# Patient Record
Sex: Male | Born: 1940
Health system: Southern US, Community
[De-identification: ages and names within clinical notes are randomized; demographics above are authoritative.]

## PROBLEM LIST (undated history)

## (undated) DIAGNOSIS — M79606 Pain in leg, unspecified: Secondary | ICD-10-CM

## (undated) DIAGNOSIS — F32A Depression, unspecified: Secondary | ICD-10-CM

## (undated) DIAGNOSIS — H9190 Unspecified hearing loss, unspecified ear: Secondary | ICD-10-CM

## (undated) DIAGNOSIS — G4733 Obstructive sleep apnea (adult) (pediatric): Secondary | ICD-10-CM

## (undated) DIAGNOSIS — R451 Restlessness and agitation: Secondary | ICD-10-CM

## (undated) DIAGNOSIS — I482 Chronic atrial fibrillation, unspecified: Secondary | ICD-10-CM

## (undated) DIAGNOSIS — E119 Type 2 diabetes mellitus without complications: Secondary | ICD-10-CM

## (undated) DIAGNOSIS — Z973 Presence of spectacles and contact lenses: Secondary | ICD-10-CM

## (undated) DIAGNOSIS — R251 Tremor, unspecified: Secondary | ICD-10-CM

## (undated) DIAGNOSIS — F0390 Unspecified dementia without behavioral disturbance: Secondary | ICD-10-CM

## (undated) DIAGNOSIS — I2699 Other pulmonary embolism without acute cor pulmonale: Secondary | ICD-10-CM

## (undated) DIAGNOSIS — IMO0001 Reserved for inherently not codable concepts without codable children: Secondary | ICD-10-CM

## (undated) DIAGNOSIS — M549 Dorsalgia, unspecified: Secondary | ICD-10-CM

## (undated) DIAGNOSIS — D6852 Prothrombin gene mutation: Secondary | ICD-10-CM

## (undated) DIAGNOSIS — M4800 Spinal stenosis, site unspecified: Secondary | ICD-10-CM

## (undated) DIAGNOSIS — I739 Peripheral vascular disease, unspecified: Secondary | ICD-10-CM

## (undated) DIAGNOSIS — E539 Vitamin B deficiency, unspecified: Secondary | ICD-10-CM

## (undated) DIAGNOSIS — E785 Hyperlipidemia, unspecified: Secondary | ICD-10-CM

## (undated) DIAGNOSIS — Z9989 Dependence on other enabling machines and devices: Secondary | ICD-10-CM

## (undated) DIAGNOSIS — E7800 Pure hypercholesterolemia, unspecified: Secondary | ICD-10-CM

## (undated) DIAGNOSIS — M1990 Unspecified osteoarthritis, unspecified site: Secondary | ICD-10-CM

## (undated) DIAGNOSIS — R200 Anesthesia of skin: Secondary | ICD-10-CM

## (undated) DIAGNOSIS — R410 Disorientation, unspecified: Secondary | ICD-10-CM

## (undated) DIAGNOSIS — I82409 Acute embolism and thrombosis of unspecified deep veins of unspecified lower extremity: Secondary | ICD-10-CM

## (undated) DIAGNOSIS — H269 Unspecified cataract: Secondary | ICD-10-CM

## (undated) DIAGNOSIS — Z8673 Personal history of transient ischemic attack (TIA), and cerebral infarction without residual deficits: Secondary | ICD-10-CM

## (undated) DIAGNOSIS — G9341 Metabolic encephalopathy: Secondary | ICD-10-CM

## (undated) DIAGNOSIS — N400 Enlarged prostate without lower urinary tract symptoms: Secondary | ICD-10-CM

## (undated) DIAGNOSIS — G3184 Mild cognitive impairment, so stated: Secondary | ICD-10-CM

## (undated) DIAGNOSIS — F419 Anxiety disorder, unspecified: Secondary | ICD-10-CM

## (undated) DIAGNOSIS — D649 Anemia, unspecified: Secondary | ICD-10-CM

## (undated) DIAGNOSIS — G8929 Other chronic pain: Secondary | ICD-10-CM

## (undated) DIAGNOSIS — R413 Other amnesia: Secondary | ICD-10-CM

## (undated) DIAGNOSIS — K219 Gastro-esophageal reflux disease without esophagitis: Secondary | ICD-10-CM

## (undated) DIAGNOSIS — G4700 Insomnia, unspecified: Secondary | ICD-10-CM

## (undated) DIAGNOSIS — M7989 Other specified soft tissue disorders: Secondary | ICD-10-CM

## (undated) DIAGNOSIS — I10 Essential (primary) hypertension: Secondary | ICD-10-CM

## (undated) HISTORY — DX: Chronic atrial fibrillation, unspecified: I48.20

## (undated) HISTORY — DX: Tremor, unspecified: R25.1

## (undated) HISTORY — DX: Prothrombin gene mutation: D68.52

## (undated) HISTORY — DX: Unspecified dementia, unspecified severity, without behavioral disturbance, psychotic disturbance, mood disturbance, and anxiety: F03.90

## (undated) HISTORY — DX: Personal history of transient ischemic attack (TIA), and cerebral infarction without residual deficits: Z86.73

## (undated) HISTORY — DX: Disorientation, unspecified: R41.0

## (undated) HISTORY — DX: Gastro-esophageal reflux disease without esophagitis: K21.9

## (undated) HISTORY — DX: Unspecified cataract: H26.9

## (undated) HISTORY — DX: Presence of spectacles and contact lenses: Z97.3

## (undated) HISTORY — DX: Unspecified osteoarthritis, unspecified site: M19.90

## (undated) HISTORY — DX: Other chronic pain: G89.29

## (undated) HISTORY — DX: Mild cognitive impairment of uncertain or unknown etiology: G31.84

## (undated) HISTORY — DX: Dorsalgia, unspecified: M54.9

## (undated) HISTORY — DX: Other pulmonary embolism without acute cor pulmonale: I26.99

## (undated) HISTORY — DX: Type 2 diabetes mellitus without complications: E11.9

## (undated) HISTORY — DX: Benign prostatic hyperplasia without lower urinary tract symptoms: N40.0

## (undated) HISTORY — DX: Acute embolism and thrombosis of unspecified deep veins of unspecified lower extremity: I82.409

## (undated) HISTORY — DX: Restlessness and agitation: R45.1

## (undated) HISTORY — DX: Obstructive sleep apnea (adult) (pediatric): G47.33

## (undated) HISTORY — DX: Reserved for inherently not codable concepts without codable children: IMO0001

## (undated) HISTORY — DX: Pain in leg, unspecified: M79.606

## (undated) HISTORY — DX: Spinal stenosis, site unspecified: M48.00

## (undated) HISTORY — DX: Other specified soft tissue disorders: M79.89

## (undated) HISTORY — DX: Hyperlipidemia, unspecified: E78.5

## (undated) HISTORY — DX: Other amnesia: R41.3

## (undated) HISTORY — DX: Pure hypercholesterolemia, unspecified: E78.00

## (undated) HISTORY — DX: Dependence on other enabling machines and devices: Z99.89

## (undated) HISTORY — PX: TONSILLECTOMY: SUR1361

## (undated) HISTORY — DX: Anesthesia of skin: R20.0

## (undated) HISTORY — DX: Insomnia, unspecified: G47.00

## (undated) HISTORY — DX: Metabolic encephalopathy: G93.41

## (undated) HISTORY — DX: Depression, unspecified: F32.A

## (undated) HISTORY — DX: Vitamin B deficiency, unspecified: E53.9

## (undated) HISTORY — DX: Unspecified hearing loss, unspecified ear: H91.90

## (undated) HISTORY — DX: Anxiety disorder, unspecified: F41.9

## (undated) HISTORY — PX: CATARACT EXTRACTION: SUR2

## (undated) HISTORY — DX: Anemia, unspecified: D64.9

## (undated) HISTORY — DX: Essential (primary) hypertension: I10

## (undated) HISTORY — DX: Peripheral vascular disease, unspecified: I73.9

---

## 1999-01-05 ENCOUNTER — Encounter: Payer: Self-pay | Admitting: Emergency Medicine

## 1999-01-05 ENCOUNTER — Emergency Department (HOSPITAL_COMMUNITY): Admission: EM | Admit: 1999-01-05 | Discharge: 1999-01-05 | Payer: Self-pay | Admitting: Emergency Medicine

## 2000-03-16 ENCOUNTER — Emergency Department (HOSPITAL_COMMUNITY): Admission: EM | Admit: 2000-03-16 | Discharge: 2000-03-16 | Payer: Self-pay | Admitting: Emergency Medicine

## 2000-03-16 ENCOUNTER — Encounter: Payer: Self-pay | Admitting: Emergency Medicine

## 2000-03-23 ENCOUNTER — Encounter: Payer: Self-pay | Admitting: Neurosurgery

## 2000-03-23 ENCOUNTER — Ambulatory Visit (HOSPITAL_COMMUNITY): Admission: RE | Admit: 2000-03-23 | Discharge: 2000-03-23 | Payer: Self-pay | Admitting: Neurosurgery

## 2000-09-29 ENCOUNTER — Encounter: Admission: RE | Admit: 2000-09-29 | Discharge: 2000-12-28 | Payer: Self-pay | Admitting: Endocrinology

## 2001-12-11 ENCOUNTER — Ambulatory Visit (HOSPITAL_COMMUNITY): Admission: RE | Admit: 2001-12-11 | Discharge: 2001-12-11 | Payer: Self-pay | Admitting: *Deleted

## 2004-12-18 ENCOUNTER — Ambulatory Visit (HOSPITAL_COMMUNITY): Admission: RE | Admit: 2004-12-18 | Discharge: 2004-12-18 | Payer: Self-pay | Admitting: Cardiology

## 2010-06-07 LAB — HM COLONOSCOPY

## 2011-12-05 DIAGNOSIS — E119 Type 2 diabetes mellitus without complications: Secondary | ICD-10-CM | POA: Diagnosis not present

## 2011-12-10 DIAGNOSIS — I10 Essential (primary) hypertension: Secondary | ICD-10-CM | POA: Diagnosis not present

## 2011-12-10 DIAGNOSIS — E119 Type 2 diabetes mellitus without complications: Secondary | ICD-10-CM | POA: Diagnosis not present

## 2011-12-10 DIAGNOSIS — E789 Disorder of lipoprotein metabolism, unspecified: Secondary | ICD-10-CM | POA: Diagnosis not present

## 2011-12-10 DIAGNOSIS — G479 Sleep disorder, unspecified: Secondary | ICD-10-CM | POA: Diagnosis not present

## 2011-12-20 DIAGNOSIS — E119 Type 2 diabetes mellitus without complications: Secondary | ICD-10-CM | POA: Diagnosis not present

## 2011-12-24 DIAGNOSIS — I10 Essential (primary) hypertension: Secondary | ICD-10-CM | POA: Diagnosis not present

## 2011-12-24 DIAGNOSIS — E119 Type 2 diabetes mellitus without complications: Secondary | ICD-10-CM | POA: Diagnosis not present

## 2012-02-13 DIAGNOSIS — E119 Type 2 diabetes mellitus without complications: Secondary | ICD-10-CM | POA: Diagnosis not present

## 2012-02-20 DIAGNOSIS — E119 Type 2 diabetes mellitus without complications: Secondary | ICD-10-CM | POA: Diagnosis not present

## 2012-02-20 DIAGNOSIS — I10 Essential (primary) hypertension: Secondary | ICD-10-CM | POA: Diagnosis not present

## 2012-02-20 DIAGNOSIS — E789 Disorder of lipoprotein metabolism, unspecified: Secondary | ICD-10-CM | POA: Diagnosis not present

## 2012-04-15 DIAGNOSIS — E119 Type 2 diabetes mellitus without complications: Secondary | ICD-10-CM | POA: Diagnosis not present

## 2012-04-15 DIAGNOSIS — E789 Disorder of lipoprotein metabolism, unspecified: Secondary | ICD-10-CM | POA: Diagnosis not present

## 2012-04-22 DIAGNOSIS — I10 Essential (primary) hypertension: Secondary | ICD-10-CM | POA: Diagnosis not present

## 2012-04-22 DIAGNOSIS — E119 Type 2 diabetes mellitus without complications: Secondary | ICD-10-CM | POA: Diagnosis not present

## 2012-04-22 DIAGNOSIS — E789 Disorder of lipoprotein metabolism, unspecified: Secondary | ICD-10-CM | POA: Diagnosis not present

## 2012-05-07 DIAGNOSIS — I10 Essential (primary) hypertension: Secondary | ICD-10-CM | POA: Diagnosis not present

## 2012-05-07 DIAGNOSIS — Z125 Encounter for screening for malignant neoplasm of prostate: Secondary | ICD-10-CM | POA: Diagnosis not present

## 2012-05-07 DIAGNOSIS — E789 Disorder of lipoprotein metabolism, unspecified: Secondary | ICD-10-CM | POA: Diagnosis not present

## 2012-05-07 DIAGNOSIS — E119 Type 2 diabetes mellitus without complications: Secondary | ICD-10-CM | POA: Diagnosis not present

## 2012-05-14 DIAGNOSIS — E119 Type 2 diabetes mellitus without complications: Secondary | ICD-10-CM | POA: Diagnosis not present

## 2012-05-14 DIAGNOSIS — E789 Disorder of lipoprotein metabolism, unspecified: Secondary | ICD-10-CM | POA: Diagnosis not present

## 2012-05-14 DIAGNOSIS — I10 Essential (primary) hypertension: Secondary | ICD-10-CM | POA: Diagnosis not present

## 2012-05-14 DIAGNOSIS — IMO0002 Reserved for concepts with insufficient information to code with codable children: Secondary | ICD-10-CM | POA: Diagnosis not present

## 2012-05-25 DIAGNOSIS — H2510 Age-related nuclear cataract, unspecified eye: Secondary | ICD-10-CM | POA: Diagnosis not present

## 2012-05-25 DIAGNOSIS — E1139 Type 2 diabetes mellitus with other diabetic ophthalmic complication: Secondary | ICD-10-CM | POA: Diagnosis not present

## 2012-07-16 DIAGNOSIS — H2510 Age-related nuclear cataract, unspecified eye: Secondary | ICD-10-CM | POA: Diagnosis not present

## 2012-07-16 DIAGNOSIS — H25019 Cortical age-related cataract, unspecified eye: Secondary | ICD-10-CM | POA: Diagnosis not present

## 2012-07-16 DIAGNOSIS — H0230 Blepharochalasis unspecified eye, unspecified eyelid: Secondary | ICD-10-CM | POA: Diagnosis not present

## 2012-07-16 DIAGNOSIS — H43819 Vitreous degeneration, unspecified eye: Secondary | ICD-10-CM | POA: Diagnosis not present

## 2012-08-17 DIAGNOSIS — H2510 Age-related nuclear cataract, unspecified eye: Secondary | ICD-10-CM | POA: Diagnosis not present

## 2012-08-29 DIAGNOSIS — Z23 Encounter for immunization: Secondary | ICD-10-CM | POA: Diagnosis not present

## 2012-09-23 DIAGNOSIS — M461 Sacroiliitis, not elsewhere classified: Secondary | ICD-10-CM | POA: Diagnosis not present

## 2012-09-23 DIAGNOSIS — M999 Biomechanical lesion, unspecified: Secondary | ICD-10-CM | POA: Diagnosis not present

## 2012-09-23 DIAGNOSIS — IMO0002 Reserved for concepts with insufficient information to code with codable children: Secondary | ICD-10-CM | POA: Diagnosis not present

## 2012-09-28 DIAGNOSIS — IMO0002 Reserved for concepts with insufficient information to code with codable children: Secondary | ICD-10-CM | POA: Diagnosis not present

## 2012-09-28 DIAGNOSIS — M999 Biomechanical lesion, unspecified: Secondary | ICD-10-CM | POA: Diagnosis not present

## 2012-09-28 DIAGNOSIS — M461 Sacroiliitis, not elsewhere classified: Secondary | ICD-10-CM | POA: Diagnosis not present

## 2012-11-13 DIAGNOSIS — E789 Disorder of lipoprotein metabolism, unspecified: Secondary | ICD-10-CM | POA: Diagnosis not present

## 2012-11-24 DIAGNOSIS — E789 Disorder of lipoprotein metabolism, unspecified: Secondary | ICD-10-CM | POA: Diagnosis not present

## 2012-11-24 DIAGNOSIS — E119 Type 2 diabetes mellitus without complications: Secondary | ICD-10-CM | POA: Diagnosis not present

## 2012-11-24 DIAGNOSIS — I10 Essential (primary) hypertension: Secondary | ICD-10-CM | POA: Diagnosis not present

## 2012-11-25 HISTORY — PX: COLONOSCOPY: SHX174

## 2012-12-01 DIAGNOSIS — G4733 Obstructive sleep apnea (adult) (pediatric): Secondary | ICD-10-CM | POA: Diagnosis not present

## 2012-12-11 DIAGNOSIS — E785 Hyperlipidemia, unspecified: Secondary | ICD-10-CM | POA: Diagnosis not present

## 2012-12-11 DIAGNOSIS — E119 Type 2 diabetes mellitus without complications: Secondary | ICD-10-CM | POA: Diagnosis not present

## 2012-12-11 DIAGNOSIS — F341 Dysthymic disorder: Secondary | ICD-10-CM | POA: Diagnosis not present

## 2012-12-11 DIAGNOSIS — I10 Essential (primary) hypertension: Secondary | ICD-10-CM | POA: Diagnosis not present

## 2013-01-26 DIAGNOSIS — E789 Disorder of lipoprotein metabolism, unspecified: Secondary | ICD-10-CM | POA: Diagnosis not present

## 2013-01-26 DIAGNOSIS — E119 Type 2 diabetes mellitus without complications: Secondary | ICD-10-CM | POA: Diagnosis not present

## 2013-02-01 DIAGNOSIS — R413 Other amnesia: Secondary | ICD-10-CM | POA: Diagnosis not present

## 2013-02-01 DIAGNOSIS — F329 Major depressive disorder, single episode, unspecified: Secondary | ICD-10-CM | POA: Diagnosis not present

## 2013-03-12 ENCOUNTER — Encounter: Payer: Self-pay | Admitting: Nurse Practitioner

## 2013-03-12 ENCOUNTER — Ambulatory Visit (INDEPENDENT_AMBULATORY_CARE_PROVIDER_SITE_OTHER): Payer: Medicare Other | Admitting: Nurse Practitioner

## 2013-03-12 VITALS — BP 105/61 | HR 73 | Ht 70.5 in | Wt 238.0 lb

## 2013-03-12 DIAGNOSIS — R413 Other amnesia: Secondary | ICD-10-CM | POA: Insufficient documentation

## 2013-03-12 DIAGNOSIS — I10 Essential (primary) hypertension: Secondary | ICD-10-CM | POA: Diagnosis not present

## 2013-03-12 DIAGNOSIS — E785 Hyperlipidemia, unspecified: Secondary | ICD-10-CM | POA: Diagnosis not present

## 2013-03-12 DIAGNOSIS — E119 Type 2 diabetes mellitus without complications: Secondary | ICD-10-CM | POA: Insufficient documentation

## 2013-03-12 NOTE — Patient Instructions (Addendum)
Patient to continue Aricept for memory Keep blood sugars in good control Memory testing is stable F/U in 6 months

## 2013-03-12 NOTE — Progress Notes (Signed)
HPI: Patient returns for followup after last visit with Dr. Terrace Arabia 12/11/12 for short term memory problems. His past medical history of diabetes,  obstructive sleep apnea,  Obesity, hyperlipidemia, presenting with a two-year history of short-term memory loss. He also has a history of back pain without radiation to either extremity, he has not fallen, no incontinence. He is alone at today's visit.  ROS:  weight gain, hearing loss, moles, snoring, joint pain, impotence, aching muscles, allergies, runny nose, memory loss, confusion, headache, numbness, weakness, tremor, depression, anxiety, disinterest in activities, insomnia,   Physical Exam General: well developed, well nourished, seated, in no evident distress Head: head normocephalic and atraumatic. Oropharynx benign Neck: supple with no carotid or supraclavicular bruits Cardiovascular: regular rate and rhythm, no murmurs  Neurologic Exam Mental Status: Awake and fully alert. Oriented to place and time. MMSE 28/30. AFT 17. He missed 1 short term item.   Cranial Nerves: Fundoscopic exam reveals sharp disc margins. Pupils equal, briskly reactive to light. Extraocular movements full without nystagmus. Visual fields full to confrontation. Hearing intact and symmetric to finger snap. Facial sensation intact. Face, tongue, palate move normally and symmetrically. Neck flexion and extension normal.  Motor: Normal bulk and tone. Normal strength in all tested extremity muscles. Sensory.: intact to touch and pinprick and vibratory.  Coordination: Rapid alternating movements normal in all extremities. Finger-to-nose and heel-to-shin performed accurately bilaterally. Gait and Station: Arises from chair without difficulty. Stance is narrow-based  Able to heel, toe and tandem walk without difficulty.  Reflexes: 2+ and symmetric. Toes downgoing.     ASSESSMENT: Mild cognitive impairment per neuropsych exam, Mini-Mental Status exam stable at 28/30. AFT       PLAN: Patient to continue Aricept for mild cognitive impairment Keep blood sugars in good control (today was 170) Memory testing is stable F/U in 6 months  Nilda Riggs, GNP-BC APRN

## 2013-05-10 DIAGNOSIS — E119 Type 2 diabetes mellitus without complications: Secondary | ICD-10-CM | POA: Diagnosis not present

## 2013-05-10 DIAGNOSIS — I10 Essential (primary) hypertension: Secondary | ICD-10-CM | POA: Diagnosis not present

## 2013-05-10 DIAGNOSIS — Z125 Encounter for screening for malignant neoplasm of prostate: Secondary | ICD-10-CM | POA: Diagnosis not present

## 2013-05-10 DIAGNOSIS — Z79899 Other long term (current) drug therapy: Secondary | ICD-10-CM | POA: Diagnosis not present

## 2013-05-10 DIAGNOSIS — E789 Disorder of lipoprotein metabolism, unspecified: Secondary | ICD-10-CM | POA: Diagnosis not present

## 2013-05-17 DIAGNOSIS — E789 Disorder of lipoprotein metabolism, unspecified: Secondary | ICD-10-CM | POA: Diagnosis not present

## 2013-05-17 DIAGNOSIS — I10 Essential (primary) hypertension: Secondary | ICD-10-CM | POA: Diagnosis not present

## 2013-05-17 DIAGNOSIS — E119 Type 2 diabetes mellitus without complications: Secondary | ICD-10-CM | POA: Diagnosis not present

## 2013-05-17 DIAGNOSIS — N529 Male erectile dysfunction, unspecified: Secondary | ICD-10-CM | POA: Diagnosis not present

## 2013-05-17 DIAGNOSIS — IMO0002 Reserved for concepts with insufficient information to code with codable children: Secondary | ICD-10-CM | POA: Diagnosis not present

## 2013-05-25 DIAGNOSIS — E291 Testicular hypofunction: Secondary | ICD-10-CM | POA: Diagnosis not present

## 2013-05-25 DIAGNOSIS — E119 Type 2 diabetes mellitus without complications: Secondary | ICD-10-CM | POA: Diagnosis not present

## 2013-05-25 DIAGNOSIS — I10 Essential (primary) hypertension: Secondary | ICD-10-CM | POA: Diagnosis not present

## 2013-06-22 DIAGNOSIS — E119 Type 2 diabetes mellitus without complications: Secondary | ICD-10-CM | POA: Diagnosis not present

## 2013-06-24 DIAGNOSIS — E291 Testicular hypofunction: Secondary | ICD-10-CM | POA: Diagnosis not present

## 2013-06-28 DIAGNOSIS — E291 Testicular hypofunction: Secondary | ICD-10-CM | POA: Diagnosis not present

## 2013-06-28 DIAGNOSIS — E789 Disorder of lipoprotein metabolism, unspecified: Secondary | ICD-10-CM | POA: Diagnosis not present

## 2013-06-28 DIAGNOSIS — E119 Type 2 diabetes mellitus without complications: Secondary | ICD-10-CM | POA: Diagnosis not present

## 2013-06-30 DIAGNOSIS — G4733 Obstructive sleep apnea (adult) (pediatric): Secondary | ICD-10-CM | POA: Diagnosis not present

## 2013-07-23 DIAGNOSIS — E291 Testicular hypofunction: Secondary | ICD-10-CM | POA: Diagnosis not present

## 2013-07-29 DIAGNOSIS — E119 Type 2 diabetes mellitus without complications: Secondary | ICD-10-CM | POA: Diagnosis not present

## 2013-07-29 DIAGNOSIS — E291 Testicular hypofunction: Secondary | ICD-10-CM | POA: Diagnosis not present

## 2013-07-29 DIAGNOSIS — E789 Disorder of lipoprotein metabolism, unspecified: Secondary | ICD-10-CM | POA: Diagnosis not present

## 2013-08-28 DIAGNOSIS — Z23 Encounter for immunization: Secondary | ICD-10-CM | POA: Diagnosis not present

## 2013-09-10 ENCOUNTER — Ambulatory Visit: Payer: Medicare Other | Admitting: Nurse Practitioner

## 2013-09-13 DIAGNOSIS — E119 Type 2 diabetes mellitus without complications: Secondary | ICD-10-CM | POA: Diagnosis not present

## 2013-09-13 DIAGNOSIS — E789 Disorder of lipoprotein metabolism, unspecified: Secondary | ICD-10-CM | POA: Diagnosis not present

## 2013-09-20 DIAGNOSIS — E119 Type 2 diabetes mellitus without complications: Secondary | ICD-10-CM | POA: Diagnosis not present

## 2013-09-20 DIAGNOSIS — E789 Disorder of lipoprotein metabolism, unspecified: Secondary | ICD-10-CM | POA: Diagnosis not present

## 2013-09-20 DIAGNOSIS — I10 Essential (primary) hypertension: Secondary | ICD-10-CM | POA: Diagnosis not present

## 2013-10-14 DIAGNOSIS — G4733 Obstructive sleep apnea (adult) (pediatric): Secondary | ICD-10-CM | POA: Diagnosis not present

## 2013-10-25 DIAGNOSIS — E119 Type 2 diabetes mellitus without complications: Secondary | ICD-10-CM | POA: Diagnosis not present

## 2013-11-01 DIAGNOSIS — I10 Essential (primary) hypertension: Secondary | ICD-10-CM | POA: Diagnosis not present

## 2013-11-01 DIAGNOSIS — G479 Sleep disorder, unspecified: Secondary | ICD-10-CM | POA: Diagnosis not present

## 2013-11-01 DIAGNOSIS — E119 Type 2 diabetes mellitus without complications: Secondary | ICD-10-CM | POA: Diagnosis not present

## 2013-12-27 DIAGNOSIS — E119 Type 2 diabetes mellitus without complications: Secondary | ICD-10-CM | POA: Diagnosis not present

## 2014-01-03 DIAGNOSIS — I10 Essential (primary) hypertension: Secondary | ICD-10-CM | POA: Diagnosis not present

## 2014-01-03 DIAGNOSIS — E119 Type 2 diabetes mellitus without complications: Secondary | ICD-10-CM | POA: Diagnosis not present

## 2014-01-03 DIAGNOSIS — E789 Disorder of lipoprotein metabolism, unspecified: Secondary | ICD-10-CM | POA: Diagnosis not present

## 2014-05-06 ENCOUNTER — Encounter (INDEPENDENT_AMBULATORY_CARE_PROVIDER_SITE_OTHER): Payer: Self-pay

## 2014-05-06 ENCOUNTER — Ambulatory Visit (INDEPENDENT_AMBULATORY_CARE_PROVIDER_SITE_OTHER): Payer: Medicare Other | Admitting: Nurse Practitioner

## 2014-05-06 ENCOUNTER — Encounter: Payer: Self-pay | Admitting: Nurse Practitioner

## 2014-05-06 VITALS — BP 105/64 | Ht 70.5 in | Wt 228.0 lb

## 2014-05-06 DIAGNOSIS — E785 Hyperlipidemia, unspecified: Secondary | ICD-10-CM

## 2014-05-06 DIAGNOSIS — R413 Other amnesia: Secondary | ICD-10-CM

## 2014-05-06 MED ORDER — DONEPEZIL HCL 5 MG PO TABS: 5.0000 mg | ORAL_TABLET | Freq: Every day | ORAL | Status: DC

## 2014-05-06 NOTE — Progress Notes (Signed)
[facility]  PATIENT: Christian [name] DOB: 1941-03-29   REASON FOR VISIT: for memory loss/mild cognitive impairment   HISTORY OF PRESENT ILLNESS: Mr. Valentino, 73 year old male returns for followup. He was last seen in this office 03/13/2003. At that time he was on Aricept 10 mg daily but he claims he has been off the medication for about 11 months  since his primary care told him it will make him gain weight. He has  past medical history of diabetes, obstructive sleep apnea, on CPAP  Obesity, hyperlipidemia, presenting with a  4 -year history of short-term memory loss. He also has a history of back pain without radiation to either extremity, he has not fallen, no incontinence. He is  accompanied by his wife. She reports that he misplaces things often, will buy things at the store and forgets where he put them. He can watch a movie but not follow the events of what is happening. He can watch TV but has difficulty concentrating. Patient is not getting regular exercise. He returns for reevaluation    REVIEW OF SYSTEMS: Full 14 system review of systems performed and notable only for those listed, all others are neg:  Constitutional: Fatigue  Cardiovascular: Leg swelling  Ear/Nose/Throat: Hearing loss Skin: N/A  Eyes: N/A  Respiratory: N/A  Gastroitestinal: N/A  Hematology/Lymphatic: N/A  Endocrine: N/A Musculoskeletal:N/A  Allergy/Immunology: N/A  Neurological:  memory loss  Psychiatric:  confusion, decreased concentration, anxiety  Sleep : Obstructive sleep apnea with CPAP   ALLERGIES: No Known Allergies  HOME MEDICATIONS: Outpatient Prescriptions Prior to Visit  Medication Sig Dispense Refill  . aspirin 81 MG tablet Take 81 mg by mouth daily.      . cyclobenzaprine (FLEXERIL) 10 MG tablet 10 mg daily.      . furosemide (LASIX) 80 MG tablet 80 mg daily.      . meloxicam (MOBIC) 15 MG tablet 15 mg daily.      . metFORMIN (GLUCOPHAGE-XR) 500 MG 24 hr tablet 500  mg 4 (four) times daily.      . metoprolol succinate (TOPROL-XL) 50 MG 24 hr tablet 50 mg daily.      . Omeprazole Magnesium 20.6 (20 BASE) MG CPDR Take 20.6 mg by mouth as needed.      . ramipril (ALTACE) 5 MG capsule 5 mg daily.      . simvastatin (ZOCOR) 40 MG tablet 40 mg daily.      [name] torsemide (DEMADEX) 20 MG tablet 20 mg daily.      [name] VICTOZA 18 MG/3ML SOLN injection 1.8 mg daily.      [name] donepezil (ARICEPT) 10 MG tablet 10 mg daily.      . sertraline (ZOLOFT) 50 MG tablet 50 mg daily.       No facility-administered medications prior to visit.    PAST MEDICAL HISTORY: Past Medical History  Diagnosis Date  . Diabetes   . High cholesterol   . Hypertension     PAST SURGICAL HISTORY: History reviewed. No pertinent past surgical history.  FAMILY HISTORY: Family History  Problem Relation Age of Onset  . Dementia Father   . Heart disease Brother   . Psychiatric Illness Sister     SOCIAL HISTORY: History   Social History  . Marital Status: Married    Spouse Name: [name]     Number of Children: 0  . Years of Education: 13   Occupational History  . Retired     Social History Main Topics  .  Smoking status: Never Smoker   . Smokeless tobacco: Never Used  . Alcohol Use: No     Comment: drinks 4 cups of coffee daily  . Drug Use: No  . Sexual Activity: Not on file   Other Topics Concern  . Not on file   Social History Narrative   Patient lives at home with wife [name]    Patient has no children.    Patient has 1 year of college.    Patient is right handed.    Patient is retired.      PHYSICAL EXAM  Filed Vitals:   05/06/14 1323  BP: 105/64  Height: 5' 10.5" (1.791 m)  Weight: 228 lb (103.42 kg)   Body mass index is 32.24 kg/(m^2).  Generalized: Well developed,  obese male in no acute distress  Head: normocephalic and atraumatic,. Oropharynx benign  Neck: Supple, no carotid bruits  Cardiac: Regular rate rhythm, no murmur  Musculoskeletal: No deformity    Neurological examination   Mentation: Alert oriented to time, place, history taking. MMSE 29/30.  unable to copy a figure. AFT 15. Follows all commands speech and language fluent  Cranial nerve II-XII: Pupils were equal round reactive to light extraocular movements were full, visual field were full on confrontational test. Facial sensation and strength were normal. hearing was intact to finger rubbing bilaterally. Uvula tongue midline. head turning and shoulder shrug were normal and symmetric.Tongue protrusion into cheek strength was normal. Motor: normal bulk and tone, full strength in the BUE, BLE,  No focal weakness Sensory: normal and symmetric to light touch, pinprick, and  vibration  Coordination: finger-nose-finger, heel-to-shin bilaterally, no dysmetria Reflexes: Brachioradialis 2/2, biceps 2/2, triceps 2/2, patellar 2/2, Achilles 2/2, plantar responses were flexor bilaterally. Gait and Station: Rising up from seated position without assistance, normal stance,  moderate stride,  smooth turning, able to perform tiptoe, and heel walking without difficulty. Tandem gait is steady, no assistive device   DIAGNOSTIC DATA (LABS, IMAGING, TESTING) -  ASSESSMENT AND PLAN  73 y.o. year old male  has a past medical history of Diabetes; High cholesterol; and Hypertension.and mild cognitive impairment. Here to  Followup. He claims he has been off his Aricept for 11 months since his primary care told him he would gain weight on the drug.     Restart Aricept 5 mg by mouth daily Memory score is stable Followup in 6 months Dennie Bible, [facility], [facility], APRN  [facility] [address] [place], [place] [address] [phone]

## 2014-05-06 NOTE — Patient Instructions (Signed)
Aricept 5 mg by mouth daily Memory score is stable Followup in 6 months

## 2014-05-23 DIAGNOSIS — E119 Type 2 diabetes mellitus without complications: Secondary | ICD-10-CM | POA: Diagnosis not present

## 2014-05-23 DIAGNOSIS — Z125 Encounter for screening for malignant neoplasm of prostate: Secondary | ICD-10-CM | POA: Diagnosis not present

## 2014-05-23 DIAGNOSIS — R279 Unspecified lack of coordination: Secondary | ICD-10-CM | POA: Diagnosis not present

## 2014-05-23 DIAGNOSIS — E789 Disorder of lipoprotein metabolism, unspecified: Secondary | ICD-10-CM | POA: Diagnosis not present

## 2014-05-23 DIAGNOSIS — Z79899 Other long term (current) drug therapy: Secondary | ICD-10-CM | POA: Diagnosis not present

## 2014-05-25 DIAGNOSIS — M79609 Pain in unspecified limb: Secondary | ICD-10-CM | POA: Diagnosis not present

## 2014-05-25 DIAGNOSIS — E789 Disorder of lipoprotein metabolism, unspecified: Secondary | ICD-10-CM | POA: Diagnosis not present

## 2014-05-25 DIAGNOSIS — I10 Essential (primary) hypertension: Secondary | ICD-10-CM | POA: Diagnosis not present

## 2014-05-25 DIAGNOSIS — E119 Type 2 diabetes mellitus without complications: Secondary | ICD-10-CM | POA: Diagnosis not present

## 2014-08-25 DIAGNOSIS — E139 Other specified diabetes mellitus without complications: Secondary | ICD-10-CM | POA: Diagnosis not present

## 2014-09-01 DIAGNOSIS — E789 Disorder of lipoprotein metabolism, unspecified: Secondary | ICD-10-CM | POA: Diagnosis not present

## 2014-09-01 DIAGNOSIS — Z23 Encounter for immunization: Secondary | ICD-10-CM | POA: Diagnosis not present

## 2014-09-01 DIAGNOSIS — I10 Essential (primary) hypertension: Secondary | ICD-10-CM | POA: Diagnosis not present

## 2014-09-01 DIAGNOSIS — M549 Dorsalgia, unspecified: Secondary | ICD-10-CM | POA: Diagnosis not present

## 2014-09-05 ENCOUNTER — Other Ambulatory Visit: Payer: Self-pay

## 2014-09-05 MED ORDER — DONEPEZIL HCL 5 MG PO TABS: 5.0000 mg | ORAL_TABLET | Freq: Every day | ORAL | Status: DC

## 2014-10-12 ENCOUNTER — Encounter: Payer: Self-pay | Admitting: Neurology

## 2014-10-18 ENCOUNTER — Encounter: Payer: Self-pay | Admitting: Neurology

## 2014-11-04 ENCOUNTER — Ambulatory Visit: Payer: Medicare Other | Admitting: Neurology

## 2014-12-02 DIAGNOSIS — E118 Type 2 diabetes mellitus with unspecified complications: Secondary | ICD-10-CM | POA: Diagnosis not present

## 2014-12-02 DIAGNOSIS — H903 Sensorineural hearing loss, bilateral: Secondary | ICD-10-CM | POA: Diagnosis not present

## 2014-12-05 ENCOUNTER — Other Ambulatory Visit: Payer: Self-pay | Admitting: Neurology

## 2014-12-05 ENCOUNTER — Ambulatory Visit: Payer: Medicare Other | Admitting: Neurology

## 2014-12-08 DIAGNOSIS — E118 Type 2 diabetes mellitus with unspecified complications: Secondary | ICD-10-CM | POA: Diagnosis not present

## 2014-12-08 DIAGNOSIS — I10 Essential (primary) hypertension: Secondary | ICD-10-CM | POA: Diagnosis not present

## 2014-12-08 DIAGNOSIS — E789 Disorder of lipoprotein metabolism, unspecified: Secondary | ICD-10-CM | POA: Diagnosis not present

## 2015-01-24 DIAGNOSIS — I2699 Other pulmonary embolism without acute cor pulmonale: Secondary | ICD-10-CM

## 2015-01-24 DIAGNOSIS — I82409 Acute embolism and thrombosis of unspecified deep veins of unspecified lower extremity: Secondary | ICD-10-CM

## 2015-01-24 HISTORY — DX: Other pulmonary embolism without acute cor pulmonale: I26.99

## 2015-01-24 HISTORY — DX: Acute embolism and thrombosis of unspecified deep veins of unspecified lower extremity: I82.409

## 2015-01-31 ENCOUNTER — Encounter (HOSPITAL_COMMUNITY): Payer: Self-pay | Admitting: Emergency Medicine

## 2015-01-31 ENCOUNTER — Inpatient Hospital Stay (HOSPITAL_COMMUNITY)
Admission: EM | Admit: 2015-01-31 | Discharge: 2015-02-03 | DRG: 176 | Disposition: A | Discharge status: Home / Self Care | Payer: Medicare Other | Attending: Pulmonary Disease | Admitting: Pulmonary Disease

## 2015-01-31 ENCOUNTER — Emergency Department (HOSPITAL_COMMUNITY): Payer: Medicare Other

## 2015-01-31 DIAGNOSIS — Z888 Allergy status to other drugs, medicaments and biological substances status: Secondary | ICD-10-CM | POA: Diagnosis not present

## 2015-01-31 DIAGNOSIS — I2699 Other pulmonary embolism without acute cor pulmonale: Secondary | ICD-10-CM | POA: Diagnosis not present

## 2015-01-31 DIAGNOSIS — R069 Unspecified abnormalities of breathing: Secondary | ICD-10-CM | POA: Diagnosis not present

## 2015-01-31 DIAGNOSIS — E785 Hyperlipidemia, unspecified: Secondary | ICD-10-CM | POA: Diagnosis present

## 2015-01-31 DIAGNOSIS — J9601 Acute respiratory failure with hypoxia: Secondary | ICD-10-CM | POA: Diagnosis not present

## 2015-01-31 DIAGNOSIS — E118 Type 2 diabetes mellitus with unspecified complications: Secondary | ICD-10-CM

## 2015-01-31 DIAGNOSIS — I10 Essential (primary) hypertension: Secondary | ICD-10-CM | POA: Diagnosis not present

## 2015-01-31 DIAGNOSIS — I82409 Acute embolism and thrombosis of unspecified deep veins of unspecified lower extremity: Secondary | ICD-10-CM | POA: Diagnosis present

## 2015-01-31 DIAGNOSIS — R0600 Dyspnea, unspecified: Secondary | ICD-10-CM

## 2015-01-31 DIAGNOSIS — E119 Type 2 diabetes mellitus without complications: Secondary | ICD-10-CM | POA: Diagnosis present

## 2015-01-31 DIAGNOSIS — Z6829 Body mass index (BMI) 29.0-29.9, adult: Secondary | ICD-10-CM

## 2015-01-31 DIAGNOSIS — Z7982 Long term (current) use of aspirin: Secondary | ICD-10-CM

## 2015-01-31 DIAGNOSIS — E669 Obesity, unspecified: Secondary | ICD-10-CM | POA: Diagnosis present

## 2015-01-31 DIAGNOSIS — I2510 Atherosclerotic heart disease of native coronary artery without angina pectoris: Secondary | ICD-10-CM | POA: Diagnosis not present

## 2015-01-31 DIAGNOSIS — G4733 Obstructive sleep apnea (adult) (pediatric): Secondary | ICD-10-CM | POA: Diagnosis present

## 2015-01-31 DIAGNOSIS — Z7901 Long term (current) use of anticoagulants: Secondary | ICD-10-CM

## 2015-01-31 DIAGNOSIS — Z9181 History of falling: Secondary | ICD-10-CM

## 2015-01-31 DIAGNOSIS — Z87891 Personal history of nicotine dependence: Secondary | ICD-10-CM | POA: Diagnosis not present

## 2015-01-31 DIAGNOSIS — F0390 Unspecified dementia without behavioral disturbance: Secondary | ICD-10-CM | POA: Diagnosis present

## 2015-01-31 DIAGNOSIS — R0602 Shortness of breath: Secondary | ICD-10-CM | POA: Diagnosis not present

## 2015-01-31 LAB — PROTIME-INR
INR: 1.08 (ref 0.00–1.49)
Prothrombin Time: 14.1 seconds (ref 11.6–15.2)

## 2015-01-31 LAB — BASIC METABOLIC PANEL
Anion gap: 10 (ref 5–15)
BUN: 28 mg/dL — ABNORMAL HIGH (ref 6–23)
CO2: 19 mmol/L (ref 19–32)
Calcium: 9.3 mg/dL (ref 8.4–10.5)
Chloride: 106 mmol/L (ref 96–112)
Creatinine, Ser: 1.05 mg/dL (ref 0.50–1.35)
GFR calc Af Amer: 80 mL/min — ABNORMAL LOW (ref >90)
GFR calc non Af Amer: 69 mL/min — ABNORMAL LOW (ref >90)
Glucose, Bld: 300 mg/dL — ABNORMAL HIGH (ref 70–99)
Potassium: 5.1 mmol/L (ref 3.5–5.1)
Sodium: 135 mmol/L (ref 135–145)

## 2015-01-31 LAB — URINE MICROSCOPIC-ADD ON

## 2015-01-31 LAB — CBC WITH DIFFERENTIAL/PLATELET
Basophils Absolute: 0 10*3/uL (ref 0.0–0.1)
Basophils Relative: 0 % (ref 0–1)
Eosinophils Absolute: 0.1 10*3/uL (ref 0.0–0.7)
Eosinophils Relative: 1 % (ref 0–5)
HCT: 45.8 % (ref 39.0–52.0)
Hemoglobin: 15.3 g/dL (ref 13.0–17.0)
Lymphocytes Relative: 9 % — ABNORMAL LOW (ref 12–46)
Lymphs Abs: 0.9 10*3/uL (ref 0.7–4.0)
MCH: 30.5 pg (ref 26.0–34.0)
MCHC: 33.4 g/dL (ref 30.0–36.0)
MCV: 91.2 fL (ref 78.0–100.0)
Monocytes Absolute: 0.7 10*3/uL (ref 0.1–1.0)
Monocytes Relative: 7 % (ref 3–12)
Neutro Abs: 8.4 10*3/uL — ABNORMAL HIGH (ref 1.7–7.7)
Neutrophils Relative %: 83 % — ABNORMAL HIGH (ref 43–77)
Platelets: 170 10*3/uL (ref 150–400)
RBC: 5.02 MIL/uL (ref 4.22–5.81)
RDW: 13 % (ref 11.5–15.5)
WBC: 10 10*3/uL (ref 4.0–10.5)

## 2015-01-31 LAB — URINALYSIS, ROUTINE W REFLEX MICROSCOPIC
Bilirubin Urine: NEGATIVE
Glucose, UA: >1000 mg/dL — AB
Hgb urine dipstick: NEGATIVE
Ketones, ur: 15 mg/dL — AB
Leukocytes, UA: NEGATIVE
Nitrite: NEGATIVE
Protein, ur: NEGATIVE mg/dL
Specific Gravity, Urine: <1.005 — ABNORMAL LOW (ref 1.005–1.030)
Urobilinogen, UA: 0.2 mg/dL (ref 0.0–1.0)
pH: 5.5 (ref 5.0–8.0)

## 2015-01-31 LAB — BRAIN NATRIURETIC PEPTIDE: B Natriuretic Peptide: 439 pg/mL — ABNORMAL HIGH (ref 0.0–100.0)

## 2015-01-31 LAB — MRSA PCR SCREENING: MRSA by PCR: NEGATIVE

## 2015-01-31 LAB — TROPONIN I
Troponin I: <0.03 ng/mL (ref <0.031)
Troponin I: 0.03 ng/mL (ref <0.031)

## 2015-01-31 LAB — CBG MONITORING, ED: Glucose-Capillary: 131 mg/dL — ABNORMAL HIGH (ref 70–99)

## 2015-01-31 LAB — APTT: aPTT: 30 seconds (ref 24–37)

## 2015-01-31 LAB — ANTITHROMBIN III: AntiThromb III Func: 127 % — ABNORMAL HIGH (ref 75–120)

## 2015-01-31 LAB — GLUCOSE, CAPILLARY: Glucose-Capillary: 129 mg/dL — ABNORMAL HIGH (ref 70–99)

## 2015-01-31 MED ORDER — INSULIN ASPART 100 UNIT/ML ~~LOC~~ SOLN
2.0000 [IU] | SUBCUTANEOUS | Status: DC
  Administered 2015-01-31: 2 [IU] via SUBCUTANEOUS
  Administered 2015-02-01: 6 [IU] via SUBCUTANEOUS
  Administered 2015-02-01: 2 [IU] via SUBCUTANEOUS

## 2015-01-31 MED ORDER — SODIUM CHLORIDE 0.9 % IV SOLN
INTRAVENOUS | Status: DC
  Administered 2015-01-31 – 2015-02-03 (×5): via INTRAVENOUS

## 2015-01-31 MED ORDER — IOHEXOL 350 MG/ML SOLN
100.0000 mL | Freq: Once | INTRAVENOUS | Status: AC | PRN
  Administered 2015-01-31: 100 mL via INTRAVENOUS

## 2015-01-31 MED ORDER — INSULIN ASPART 100 UNIT/ML ~~LOC~~ SOLN
6.0000 [IU] | Freq: Once | SUBCUTANEOUS | Status: AC
  Administered 2015-01-31: 6 [IU] via INTRAVENOUS
  Filled 2015-01-31: qty 1

## 2015-01-31 MED ORDER — HEPARIN (PORCINE) IN NACL 100-0.45 UNIT/ML-% IJ SOLN
18.0000 [IU]/kg/h | Freq: Once | INTRAMUSCULAR | Status: AC
  Administered 2015-01-31: 18 [IU]/kg/h via INTRAVENOUS

## 2015-01-31 MED ORDER — SODIUM CHLORIDE 0.9 % IV SOLN: 250.0000 mL | INTRAVENOUS | Status: DC | PRN

## 2015-01-31 MED ORDER — ENOXAPARIN SODIUM 100 MG/ML ~~LOC~~ SOLN: 100.0000 mg | Freq: Once | SUBCUTANEOUS | Status: DC

## 2015-01-31 MED ORDER — HEPARIN BOLUS VIA INFUSION
7500.0000 [IU] | Freq: Once | INTRAVENOUS | Status: AC
  Administered 2015-01-31: 7500 [IU] via INTRAVENOUS

## 2015-01-31 MED ORDER — HEPARIN BOLUS VIA INFUSION: 4000.0000 [IU] | Freq: Once | INTRAVENOUS | Status: DC

## 2015-01-31 MED ORDER — FUROSEMIDE 10 MG/ML IJ SOLN
40.0000 mg | Freq: Once | INTRAMUSCULAR | Status: AC
  Administered 2015-01-31: 40 mg via INTRAVENOUS
  Filled 2015-01-31: qty 4

## 2015-01-31 MED ORDER — DONEPEZIL HCL 5 MG PO TABS
5.0000 mg | ORAL_TABLET | Freq: Every day | ORAL | Status: DC
  Administered 2015-02-01 – 2015-02-03 (×3): 5 mg via ORAL
  Filled 2015-01-31 (×3): qty 1

## 2015-01-31 MED ORDER — PANTOPRAZOLE SODIUM 40 MG IV SOLR
40.0000 mg | Freq: Every day | INTRAVENOUS | Status: DC
  Administered 2015-01-31: 40 mg via INTRAVENOUS
  Filled 2015-01-31 (×2): qty 40

## 2015-01-31 MED ORDER — HEPARIN (PORCINE) IN NACL 100-0.45 UNIT/ML-% IJ SOLN
1450.0000 [IU]/h | INTRAMUSCULAR | Status: DC
  Administered 2015-02-01: 1450 [IU]/h via INTRAVENOUS
  Filled 2015-01-31 (×3): qty 250

## 2015-01-31 MED ORDER — CETYLPYRIDINIUM CHLORIDE 0.05 % MT LIQD
7.0000 mL | Freq: Two times a day (BID) | OROMUCOSAL | Status: DC
  Administered 2015-01-31 – 2015-02-01 (×3): 7 mL via OROMUCOSAL

## 2015-01-31 NOTE — Progress Notes (Addendum)
ADDENDUM Patient transferred to [facility]. Noticed there were two orders for heparin on arrival (on was "completed", but the other was not yet charted). Patient received a heparin bolus of 7500 units IV x1 and was started on a rate of 1800 units/hr based on orders by the MD despite pharmacy orders for a lower bolus and rate as per our protocol. With such a large bolus, concerned for a high rate as well.   Plan: -turn down heparin rate to 1600 units/hr and check a heparin level at 0200. This will be 8 hours from bolus, which is appropriate based on his weighrt -daily HL and CBC  Melayah Skorupski D. Ahilyn Nell, PharmD, BCPS Clinical Pharmacist Pager: [phone] 01/31/2015 8:07 PM    ANTICOAGULATION CONSULT NOTE - Initial Consult  Pharmacy Consult for Heparin Indication: pulmonary embolus  Allergies  Allergen Reactions  . Oxycodone Other (See Comments)    Mental status changes  . Oxycontin [Oxycodone Hcl] Other (See Comments)    Mental status changes per pt   Patient Measurements: Height: 5' 10.5" (179.1 cm) Weight: 223 lb (101.152 kg) IBW/kg (Calculated) : 74.15 Heparin Dosing Weight: 95Kg  Vital Signs: Temp: 97.7 F (36.5 C) (03/08 1711) Temp Source: Oral (03/08 1711) BP: 119/65 mmHg (03/08 1711) Pulse Rate: 46 (03/08 1715)  Labs:  Recent Labs  01/31/15 1443  HGB 15.3  HCT 45.8  PLT 170  CREATININE 1.05  TROPONINI <0.03   Estimated Creatinine Clearance: 77.6 mL/min (by C-G formula based on Cr of 1.05).  Medical History: Past Medical History  Diagnosis Date  . Diabetes   . High cholesterol   . Hypertension   . Sleep apnea    Assessment: 74yo male with PE.  Asked to initiate unfractionated Heparin.  Pt is obese.  Labs reviewed.  Goal of Therapy:  Heparin level 0.3-0.7 units/ml w/in 24 hrs of initiation Monitor platelets by anticoagulation protocol: Yes   Plan:  Heparin 4000 units bolus now x 1 Heparin infusion at 1500 units/hr Heparin level in 6 hrs then daily CBC daily  while on Heparin  [place] Crane, Scott A 01/31/2015,5:37 PM

## 2015-01-31 NOTE — ED Notes (Signed)
Pt reports increasing weakness with SOB. Worsening since 2 months ago. Pt has become dizzy and has had falls.

## 2015-01-31 NOTE — ED Provider Notes (Signed)
CSN: [id number]     Arrival date & time 01/31/15  1324 History  This chart was scribed for Virgel Manifold, MD by Jeanell Sparrow, ED Scribe. This patient was seen in room APA05/APA05 and the patient's care was started at 2:12 PM.     Chief Complaint  Patient presents with  . Weakness   Patient is a 74 y.o. male presenting with weakness. The history is provided by the patient. No language interpreter was used.  Weakness Associated symptoms include shortness of breath.   HPI Comments: Christian [name] is a 74 y.o. male who presents to the Emergency Department complaining of constant moderate generalized weakness that started about 2 months ago. He reports that his weakness and SOB has been gradually worsening the past few months. He states that today he has not been able to ambulate due to SOB and weakness. He reports that he cannot lay flat due to his SOB. He states that he has a nonproductive cough. He states that he quit smoking about 10-15 years ago. He denies any fever, dysuria, appetite change, sleep disturbance, hematuria, swelling, or black stools.   Past Medical History  Diagnosis Date  . Diabetes   . High cholesterol   . Hypertension   . Sleep apnea    Past Surgical History  Procedure Laterality Date  . Tonsillectomy     Family History  Problem Relation Age of Onset  . Dementia Father   . Heart disease Brother   . Psychiatric Illness Sister    History  Substance Use Topics  . Smoking status: Never Smoker   . Smokeless tobacco: Never Used  . Alcohol Use: No     Comment: drinks 4 cups of coffee daily    Review of Systems  Constitutional: Negative for fever and appetite change.  Respiratory: Positive for shortness of breath.   Cardiovascular: Negative for leg swelling.  Gastrointestinal: Negative for blood in stool.  Genitourinary: Negative for dysuria and hematuria.  Musculoskeletal: Positive for gait problem.  Neurological: Positive for weakness.   Psychiatric/Behavioral: Negative for sleep disturbance.  All other systems reviewed and are negative.   Allergies  Oxycodone and Oxycontin  Home Medications   Prior to Admission medications   Medication Sig Start Date End Date Taking? Authorizing Provider  aspirin 81 MG tablet Take 81 mg by mouth daily.    Historical Provider, MD  cyclobenzaprine (FLEXERIL) 10 MG tablet 10 mg daily. 12/07/12   Historical Provider, MD  Diphenhydramine-APAP, sleep, (ACETAMINOPHEN PM PO) Take by mouth.    Historical Provider, MD  donepezil (ARICEPT) 5 MG tablet TAKE 1 TABLET ONCE DAILY. 12/05/14   Marcial Pacas, MD  furosemide (LASIX) 80 MG tablet 80 mg daily. 03/08/13   Historical Provider, MD  meloxicam (MOBIC) 15 MG tablet 15 mg daily. 02/18/13   Historical Provider, MD  metFORMIN (GLUCOPHAGE-XR) 500 MG 24 hr tablet 500 mg 4 (four) times daily. 02/18/13   Historical Provider, MD  metoprolol succinate (TOPROL-XL) 50 MG 24 hr tablet 50 mg daily. 02/18/13   Historical Provider, MD  Misc Natural Products (OSTEO BI-FLEX JOINT SHIELD PO) Take by mouth.    Historical Provider, MD  Omeprazole Magnesium 20.6 (20 BASE) MG CPDR Take 20.6 mg by mouth as needed.    Historical Provider, MD  ramipril (ALTACE) 5 MG capsule 5 mg daily. 02/24/13   Historical Provider, MD  simvastatin (ZOCOR) 40 MG tablet 40 mg daily. 03/04/13   Historical Provider, MD  torsemide (DEMADEX) 20 MG tablet 20 mg daily.  12/07/12   Historical Provider, MD  VICTOZA 18 MG/3ML SOLN injection 1.8 mg daily. 02/18/13   Historical Provider, MD   BP 100/58 mmHg  Pulse 96  Temp(Src) 95 F (35 C) (Oral)  Resp 18  Ht 5' 10.5" (1.791 m)  Wt 223 lb (101.152 kg)  BMI 31.53 kg/m2  SpO2 92% Physical Exam  Constitutional: He appears well-developed and well-nourished. No distress.  HENT:  Head: Normocephalic and atraumatic.  Right Ear: External ear normal.  Left Ear: External ear normal.  Eyes: Conjunctivae are normal. Right eye exhibits no discharge. Left eye  exhibits no discharge. No scleral icterus.  Neck: Neck supple. No tracheal deviation present.  Cardiovascular: Normal rate, regular rhythm and intact distal pulses.   Pulmonary/Chest: Effort normal. No stridor. No respiratory distress. He has no wheezes.  Abdominal: Soft. Bowel sounds are normal. He exhibits no distension. There is no tenderness. There is no rebound and no guarding.  Musculoskeletal: He exhibits edema. He exhibits no tenderness.  Symmetric pitting lower extremity edema. No calf tenderness.  Neurological: He is alert. He has normal strength. No cranial nerve deficit (no facial droop, extraocular movements intact, no slurred speech) or sensory deficit. He exhibits normal muscle tone. He displays no seizure activity. Coordination normal.  Skin: Skin is warm and dry. No rash noted.  Psychiatric: He has a normal mood and affect.  Nursing note and vitals reviewed.   ED Course  Procedures (including critical care time)  CRITICAL CARE Performed by: Virgel Manifold   Total critical care time: 40 minutes  Critical care time was exclusive of separately billable procedures and treating other patients. Critical care was necessary to treat or prevent imminent or life-threatening deterioration. Critical care was time spent personally by me on the following activities: development of treatment plan with patient and/or surrogate as well as nursing, discussions with consultants, evaluation of patient's response to treatment, examination of patient, obtaining history from patient or surrogate, ordering and performing treatments and interventions, ordering and review of laboratory studies, ordering and review of radiographic studies, pulse oximetry and re-evaluation of patient's condition.  DIAGNOSTIC STUDIES: Oxygen Saturation is 92% on RA, normal by my interpretation.    COORDINATION OF CARE: 2:16 PM- Pt advised of plan for treatment which includes radiology and labs and pt agrees.  Labs  Review Labs Reviewed - No data to display  Imaging Review Ct Angio Chest Pe W/cm &/or Wo Cm  01/31/2015   CLINICAL DATA:  74 year old male with 2 months of moderate generalized weakness and shortness of breath accompanied by nonproductive cough. Symptoms have been progressive.  EXAM: CT ANGIOGRAPHY CHEST WITH CONTRAST  TECHNIQUE: Multidetector CT imaging of the chest was performed using the standard protocol during bolus administration of intravenous contrast. Multiplanar CT image reconstructions and MIPs were obtained to evaluate the vascular anatomy.  CONTRAST:  128mL OMNIPAQUE IOHEXOL 350 MG/ML SOLN  COMPARISON:  Chest x-ray obtained earlier today  FINDINGS: Mediastinum: Unremarkable CT appearance of the thyroid gland. No suspicious mediastinal or hilar adenopathy. No soft tissue mediastinal mass. The thoracic esophagus is unremarkable.  Heart/Vascular: Adequate opacification of the pulmonary arteries to the proximal subsegmental level. Large volume bilateral pulmonary emboli including saddle embolus. Emboli are noted extending into the lobar arteries of all lobes of both lungs as well as segmental and subsegmental branches in the bilateral lower lobes. There is evidence of right heart strain. The RV to LV ratio is elevated at 1.54. No pericardial effusion. Normal caliber thoracic aorta.  Lungs/Pleura: Negative  for pleural effusion. Trace dependent atelectasis in the lower lungs. No evidence of a pulmonary edema, focal airspace consolidation or suspicious pulmonary nodule or mass. Mild bronchial wall thickening.  Bones/Soft Tissues: No acute fracture or aggressive appearing lytic or blastic osseous lesion. Multilevel degenerative spurring throughout the thoracic spine. 9 mm sclerotic focus in the T7 vertebral body favored to represent a benign bone island.  Upper Abdomen: Water attenuation circumscribed lesion in the medial right hepatic lobe is incompletely imaged but most suggestive of a simple cyst.  Otherwise, unremarkable visualized upper abdomen.  Review of the MIP images confirms the above findings.  IMPRESSION: 1. Positive for acute large volume PE with CT evidence of right heart strain (RV/LV Ratio = 1.54) consistent with at least submassive (intermediate risk)PE. The presence of right heart strain has been associated with an increased risk of morbidity and mortality. Consultation with Pulmonary and Critical Care Medicine is recommended. 2. Probable hepatic cyst.  Critical Value/emergent results were called by telephone at the time of interpretation on 01/31/2015 at 5:15 pm to Dr. Virgel Manifold , who verbally acknowledged these results.   Electronically Signed   By: Jacqulynn Cadet M.D.   On: 01/31/2015 17:18   Dg Chest Port 1 View  01/31/2015   CLINICAL DATA:  74 year old male with increasing weakness and shortness of breath x2 months. Initial encounter.  EXAM: PORTABLE CHEST - 1 VIEW  COMPARISON:  05/08/2010 and earlier.  FINDINGS: Portable AP upright view at 1450 hrs. Stable lung volumes. Normal cardiac size and mediastinal contours. Allowing for portable technique, the lungs are clear. No pneumothorax or effusion.  IMPRESSION: No acute cardiopulmonary abnormality.   Electronically Signed   By: Genevie Ann M.D.   On: 01/31/2015 14:57     EKG Interpretation   Date/Time:  Tuesday January 31 2015 14:42:43 EST Ventricular Rate:  90 PR Interval:  179 QRS Duration: 135 QT Interval:  432 QTC Calculation: 529 R Axis:   -100 Text Interpretation:  Sinus rhythm RBBB and LAFB No old tracing to compare  Confirmed by [place]  MD, [place] ([id number]) on 01/31/2015 3:29:06 PM      MDM   Final diagnoses:  Dyspnea  Pulmonary embolism    73yM with dyspnea. Submassive PE. RH strain. Discussed with CCM. BP ok. No tachycardia but is on rate controlling meds. Mild hypoxemia but only requires 2L  to keep in 90s. Has felt dizzy at times but no syncope.  No absolute indication for lysis at this point but will start  unfractionated heparin for the time being. Transfer to ICU at [facility].   I personally preformed the services scribed in my presence. The recorded information has been reviewed is accurate. Virgel Manifold, MD.     Virgel Manifold, MD 01/31/15 [phone]

## 2015-01-31 NOTE — H&P (Signed)
PULMONARY / CRITICAL CARE MEDICINE   Name: Christian [name] MRN: [id number] DOB: 03-25-1941    ADMISSION DATE:  01/31/2015 CONSULTATION DATE:  01/31/2015  REFERRING MD :  AP EDP  CHIEF COMPLAINT:  SOB  INITIAL PRESENTATION: 74 year old male presented to [facility] ED 3/8 c/o progressive weakness and SOB x 2 months. Found to have large volume PE and was transferred to [facility] for potential thrombolytic therapy. PCCM accepting.  STUDIES:  3/8 CTA chest> Large volume PE with evidence of R heart strain (RV/LV = 1.54) 3/9 Echo >  SIGNIFICANT EVENTS: 3/8 presented to ED with SOB, CTA demonstrates large volume PE. Tx to [facility] ICU.   HISTORY OF PRESENT ILLNESS:  74 year old male, former smoker, with PMH as below, which includes DM, HTN, OSA presented to [facility] ED 3/8 complaining of progressive SOB and generalized weakness for the past 2 months. He also reports that he falls frequently (as often as once a week) and had R arm pain one week ago, which he assumed was due to fall. This pain resolved spontaneously overnight several days ago. On the day of presentation SOB was severe while walking to his shed. He had to stop for several breaks and it began to significantly limit his level of activity. Also notes orthopnea and non-productive cough. In ED he was sent for CTA chest which demonstrated large volume PE with evidence of R heart strain. He was started on heparin per PE protocol and transferred to [facility] ICU for the potential of thrombolytic intervention. PCCM to accept transfer.   PAST MEDICAL HISTORY :   has a past medical history of Diabetes; High cholesterol; Hypertension; and Sleep apnea.  has past surgical history that includes Tonsillectomy. Prior to Admission medications   Medication Sig Start Date End Date Taking? Authorizing Provider  aspirin 81 MG tablet Take 81 mg by mouth every evening.    Yes Historical Provider, MD  ibuprofen (ADVIL,MOTRIN) 200 MG tablet Take 200 mg by  mouth every 6 (six) hours as needed for mild pain or moderate pain.   Yes Historical Provider, MD  meloxicam (MOBIC) 15 MG tablet 15 mg daily. 02/18/13  Yes Historical Provider, MD  VICTOZA 18 MG/3ML SOLN injection 1.8 mg daily. 02/18/13  Yes Historical Provider, MD  cyclobenzaprine (FLEXERIL) 10 MG tablet 10 mg daily. 12/07/12   Historical Provider, MD  Diphenhydramine-APAP, sleep, (ACETAMINOPHEN PM PO) Take by mouth.    Historical Provider, MD  donepezil (ARICEPT) 5 MG tablet TAKE 1 TABLET ONCE DAILY. 12/05/14   Marcial Pacas, MD  furosemide (LASIX) 80 MG tablet 80 mg daily. 03/08/13   Historical Provider, MD  metFORMIN (GLUCOPHAGE-XR) 500 MG 24 hr tablet 500 mg 4 (four) times daily. 02/18/13   Historical Provider, MD  metoprolol succinate (TOPROL-XL) 50 MG 24 hr tablet 50 mg daily. 02/18/13   Historical Provider, MD  Misc Natural Products (OSTEO BI-FLEX JOINT SHIELD PO) Take by mouth.    Historical Provider, MD  ramipril (ALTACE) 5 MG capsule 5 mg daily. 02/24/13   Historical Provider, MD  simvastatin (ZOCOR) 40 MG tablet 40 mg daily. 03/04/13   Historical Provider, MD  torsemide (DEMADEX) 20 MG tablet 20 mg daily. 12/07/12   Historical Provider, MD   Allergies  Allergen Reactions  . Oxycodone Other (See Comments)    Mental status changes  . Oxycontin [Oxycodone Hcl] Other (See Comments)    Mental status changes per pt    FAMILY HISTORY:  indicated that his  mother is deceased. He indicated that his father is deceased.  SOCIAL HISTORY:  reports that he has never smoked. He has never used smokeless tobacco. He reports that he does not drink alcohol or use illicit drugs.  REVIEW OF SYSTEMS:   Bolds are positive  Constitutional: weight loss, gain, night sweats, Fevers, chills, fatigue .  HEENT: headaches, Sore throat, sneezing, nasal congestion, post nasal drip, Difficulty swallowing, Tooth/dental problems, visual complaints visual changes, ear ache CV:  chest pain, radiates: ,Orthopnea, PND, swelling  in lower extremities, dizziness, palpitations, syncope.  GI  heartburn, indigestion, abdominal pain, nausea, vomiting, diarrhea, change in bowel habits, loss of appetite, bloody stools.  Resp: cough, productive: , hemoptysis, dyspnea, chest pain, pleuritic.  Skin: rash or itching or icterus GU: dysuria, change in color of urine, urgency or frequency. flank pain, hematuria  MS: joint pain or swelling. decreased range of motion  Psych: change in mood or affect. depression or anxiety.  Neuro: difficulty with speech, weakness, numbness, ataxia    SUBJECTIVE:   VITAL SIGNS: Temp:  [95 F (35 C)-97.7 F (36.5 C)] 97.7 F (36.5 C) (03/08 1711) Pulse Rate:  [43-96] 46 (03/08 1715) Resp:  [17-24] 19 (03/08 1715) BP: (90-119)/(54-77) 119/65 mmHg (03/08 1711) SpO2:  [90 %-98 %] 97 % (03/08 1715) Weight:  [101.152 kg (223 lb)] 101.152 kg (223 lb) (03/08 1327) HEMODYNAMICS:   VENTILATOR SETTINGS:   INTAKE / OUTPUT:  Intake/Output Summary (Last 24 hours) at 01/31/15 1759 Last data filed at 01/31/15 1711  Gross per 24 hour  Intake      0 ml  Output    350 ml  Net   -350 ml    PHYSICAL EXAMINATION: General:  Elderly male in NAD on 2L [place] Neuro:  Alert, oriented, non-focal. Mild L sided facial droop.  HEENT:  [place]/AT, no JVD noted, PERRL Cardiovascular:  RRR, no MRG Lungs:  Clear bilateral breath sounds, breathing comfortably at rest, some SOB with long conversation. Abdomen:  Soft, non-tender, non-distended Musculoskeletal:  No acute deformity or ROM limitation. Trace pedal edema. Skin:  Grossly intact  LABS:  CBC  Recent Labs Lab 01/31/15 1443  WBC 10.0  HGB 15.3  HCT 45.8  PLT 170   Coag's No results for input(s): APTT, INR in the last 168 hours. BMET  Recent Labs Lab 01/31/15 1443  NA 135  K 5.1  CL 106  CO2 19  BUN 28*  CREATININE 1.05  GLUCOSE 300*   Electrolytes  Recent Labs Lab 01/31/15 1443  CALCIUM 9.3   Sepsis Markers No results for input(s):  LATICACIDVEN, PROCALCITON, O2SATVEN in the last 168 hours. ABG No results for input(s): PHART, PCO2ART, PO2ART in the last 168 hours. Liver Enzymes No results for input(s): AST, ALT, ALKPHOS, BILITOT, ALBUMIN in the last 168 hours. Cardiac Enzymes  Recent Labs Lab 01/31/15 1443  TROPONINI <0.03   Glucose  Recent Labs Lab 01/31/15 1707  GLUCAP 131*    Imaging No results found.   ASSESSMENT / PLAN:  PULMONARY A: Dyspnea 2nd to pulmonary embolism  P:   Supplemental O2 as needed to maintain SpO2 > 92% IS  CARDIOVASCULAR A:  Hypotension (resolved)  P:  Tele monitoring Gentle volume Trend troponin Check Echo  RENAL A:   No acute issues  P:   Hydrate Follow Bmet Hold pre-admission lasix, K supplementation  GASTROINTESTINAL A:   No acute issues  P:   SUP: IV Protonix NPO for now  HEMATOLOGIC A:   Saddle- Bilateral Pulmonary  embolism - large clot burden.  P:  Heparin gtt per pharmacy Trend coags Lower extremity duplex, RUE duplex Echo Holding home ramipril, propranolol  INFECTIOUS A:   No acute issues  P:   Monitor clinically  ENDOCRINE A:   DM    P:   CBG monitoring and SSI Hold home dapagliflozin, metformin, victoza  NEUROLOGIC A:   Dementia Frequent falls  P:   RASS goal: 0 Monitor Continue home aricept 3/9 D/c meloxicam in setting VTE   FAMILY  - Updates:   - Inter-disciplinary family meet or Palliative Care meeting due by:  3/15   Georgann Housekeeper, AGACNP-BC [place] Pulmonology/Critical Care Pager [phone] or [phone]    Attending:  I have seen and examined the patient with nurse practitioner/resident and agree with the note above.   Mr. Berrocal has had a large PE but he is hemodynamically stable Lungs clear, CV exam with frequent PACs, but normal otherwise, left mouth facial droop> his wife says this is chronic, no abnormal findings on my neuro exam  CT images reviewed> large PE, saddle, bilateral,  appears at minimum sub acute  Unprovoked Large PE> sub massive, does not need lytics. Give heparin now, d/c on lifelong anticoagulation, needs to f/u with PCP to ensure all age appropriate cancer screening is up to date. No indication for hypercoagulable testing as he needs lifelong anticoagulation.  Monitor in ICU setting  My CC time is 40 minutes  Roselie Awkward, MD [place] Pager: [phone] Cell: [phone] If no response, call [phone]

## 2015-01-31 NOTE — ED Notes (Signed)
MD at bedside. 

## 2015-01-31 NOTE — ED Notes (Signed)
Called [facility] back to inform them that [facility] was in route to [facility] with the PT.

## 2015-02-01 DIAGNOSIS — I2699 Other pulmonary embolism without acute cor pulmonale: Secondary | ICD-10-CM

## 2015-02-01 LAB — BASIC METABOLIC PANEL
Anion gap: 8 (ref 5–15)
BUN: 21 mg/dL (ref 6–23)
CHLORIDE: 106 mmol/L (ref 96–112)
CO2: 22 mmol/L (ref 19–32)
CREATININE: 0.89 mg/dL (ref 0.50–1.35)
Calcium: 9 mg/dL (ref 8.4–10.5)
GFR calc non Af Amer: 84 mL/min — ABNORMAL LOW (ref >90)
GLUCOSE: 172 mg/dL — AB (ref 70–99)
POTASSIUM: 3.9 mmol/L (ref 3.5–5.1)
SODIUM: 136 mmol/L (ref 135–145)

## 2015-02-01 LAB — GLUCOSE, CAPILLARY
GLUCOSE-CAPILLARY: 190 mg/dL — AB (ref 70–99)
Glucose-Capillary: 211 mg/dL — ABNORMAL HIGH (ref 70–99)
Glucose-Capillary: 137 mg/dL — ABNORMAL HIGH (ref 70–99)
Glucose-Capillary: 81 mg/dL (ref 70–99)
Glucose-Capillary: 165 mg/dL — ABNORMAL HIGH (ref 70–99)
Glucose-Capillary: 154 mg/dL — ABNORMAL HIGH (ref 70–99)

## 2015-02-01 LAB — CBC
HCT: 41.6 % (ref 39.0–52.0)
Hemoglobin: 13.7 g/dL (ref 13.0–17.0)
MCH: 29.6 pg (ref 26.0–34.0)
MCHC: 32.9 g/dL (ref 30.0–36.0)
MCV: 89.8 fL (ref 78.0–100.0)
Platelets: 173 10*3/uL (ref 150–400)
RBC: 4.63 MIL/uL (ref 4.22–5.81)
RDW: 13.1 % (ref 11.5–15.5)
WBC: 7.1 10*3/uL (ref 4.0–10.5)

## 2015-02-01 LAB — HEPARIN LEVEL (UNFRACTIONATED)
Heparin Unfractionated: 1.1 IU/mL — ABNORMAL HIGH (ref 0.30–0.70)
Heparin Unfractionated: 0.8 IU/mL — ABNORMAL HIGH (ref 0.30–0.70)

## 2015-02-01 LAB — TROPONIN I: Troponin I: 0.03 ng/mL (ref <0.031)

## 2015-02-01 LAB — HOMOCYSTEINE: Homocysteine: 14.4 umol/L (ref 0.0–15.0)

## 2015-02-01 MED ORDER — INSULIN ASPART 100 UNIT/ML ~~LOC~~ SOLN: 0.0000 [IU] | Freq: Every day | SUBCUTANEOUS | Status: DC

## 2015-02-01 MED ORDER — RIVAROXABAN 15 MG PO TABS
15.0000 mg | ORAL_TABLET | Freq: Two times a day (BID) | ORAL | Status: DC
  Administered 2015-02-01 – 2015-02-03 (×4): 15 mg via ORAL
  Filled 2015-02-01 (×7): qty 1

## 2015-02-01 MED ORDER — RIVAROXABAN 20 MG PO TABS: 20.0000 mg | ORAL_TABLET | Freq: Every day | ORAL | Status: DC

## 2015-02-01 MED ORDER — HEPARIN (PORCINE) IN NACL 100-0.45 UNIT/ML-% IJ SOLN: 1300.0000 [IU]/h | INTRAMUSCULAR | Status: AC

## 2015-02-01 MED ORDER — PANTOPRAZOLE SODIUM 40 MG PO TBEC
40.0000 mg | DELAYED_RELEASE_TABLET | Freq: Every day | ORAL | Status: DC
  Administered 2015-02-01 – 2015-02-03 (×3): 40 mg via ORAL
  Filled 2015-02-01 (×3): qty 1

## 2015-02-01 MED ORDER — INSULIN ASPART 100 UNIT/ML ~~LOC~~ SOLN
0.0000 [IU] | Freq: Three times a day (TID) | SUBCUTANEOUS | Status: DC
  Administered 2015-02-01: 2 [IU] via SUBCUTANEOUS
  Administered 2015-02-01: 3 [IU] via SUBCUTANEOUS
  Administered 2015-02-02: 2 [IU] via SUBCUTANEOUS
  Administered 2015-02-02 – 2015-02-03 (×2): 3 [IU] via SUBCUTANEOUS
  Administered 2015-02-03: 5 [IU] via SUBCUTANEOUS
  Filled 2015-02-01: qty 0.15

## 2015-02-01 NOTE — Progress Notes (Signed)
VASCULAR LAB PRELIMINARY  PRELIMINARY  PRELIMINARY  PRELIMINARY  Right upper extremity venous Doppler completed.    Preliminary report:  There is no obvious evidence of DVT or SVT noted in the right upper extremity.   Miqueas Whilden, RVT 02/01/2015, 9:53 AM

## 2015-02-01 NOTE — Progress Notes (Signed)
Utilization Review Completed.Donne Anon T3/07/2015

## 2015-02-01 NOTE — Progress Notes (Signed)
RT entered room to place patient on CPAP. He is refusing to wear it at this time. RT informed patient if he changes his mind and decides to wear it at a later time have nurse contact RT and we will place him on it. RN aware.

## 2015-02-01 NOTE — Progress Notes (Signed)
[place] for Heparin Indication: pulmonary embolus  Allergies  Allergen Reactions  . Oxycodone Other (See Comments)    Mental status changes  . Oxycontin [Oxycodone Hcl] Other (See Comments)    Mental status changes per pt   Patient Measurements: Height: 5' 10.5" (179.1 cm) Weight: 223 lb (101.152 kg) IBW/kg (Calculated) : 74.15 Heparin Dosing Weight: 95Kg  Vital Signs: Temp: 97.4 F (36.3 C) (03/09 0013) Temp Source: Oral (03/09 0013) BP: 98/69 mmHg (03/09 0300) Pulse Rate: 69 (03/09 0300)  Labs:  Recent Labs  01/31/15 1443 01/31/15 1749 01/31/15 2115 02/01/15 0227  HGB 15.3  --   --   --   HCT 45.8  --   --   --   PLT 170  --   --   --   APTT  --  30  --   --   LABPROT  --  14.1  --   --   INR  --  1.08  --   --   HEPARINUNFRC  --   --   --  1.10*  CREATININE 1.05  --   --  0.89  TROPONINI <0.03  --  0.03 0.03   Estimated Creatinine Clearance: 91.5 mL/min (by C-G formula based on Cr of 0.89).  Assessment: 74 yo male with PE for heparin.  Goal of Therapy:  Heparin level 0.3-0.7 units/ml  Monitor platelets by anticoagulation protocol: Yes   Plan:  Decrease Heparin 1450 units/hr Check heparin level in 6 hours.   Caryl Pina 02/01/2015,4:18 AM

## 2015-02-01 NOTE — Progress Notes (Signed)
PULMONARY / CRITICAL CARE MEDICINE   Name: RAMI BUDHU MRN: [id number] DOB: 1941-03-25    ADMISSION DATE:  01/31/2015 CONSULTATION DATE:  01/31/2015  REFERRING MD :  AP EDP  CHIEF COMPLAINT:  SOB  INITIAL PRESENTATION:  74 yo male transferred from [facility] with progressive dyspnea, weakness from PE and DVT.  STUDIES:  3/8 CTA chest> Large volume PE with evidence of R heart strain (RV/LV = 1.54) 3/9 Doppler Rt arm >> no DVT 3/9 Doppler legs b/l >> DVT Rt popliteal vein and b/l posterior tibial/peroneal veins 3/9 Echo >  SIGNIFICANT EVENTS: 3/8 presented to ED with SOB, CTA demonstrates large volume PE. Tx to [facility] ICU.   SUBJECTIVE:  Denies chest pain.  Breathing okay.  Denies leg pain.  VITAL SIGNS: Temp:  [95 F (35 C)-98.7 F (37.1 C)] 97.2 F (36.2 C) (03/09 0434) Pulse Rate:  [41-96] 41 (03/09 1100) Resp:  [12-24] 18 (03/09 1100) BP: (90-133)/(54-82) 120/57 mmHg (03/09 1100) SpO2:  [83 %-100 %] 98 % (03/09 1100) FiO2 (%):  [2 %] 2 % (03/08 2000) Weight:  [202 lb 13.2 oz (92 kg)-223 lb (101.152 kg)] 202 lb 13.2 oz (92 kg) (03/09 0655) INTAKE / OUTPUT:  Intake/Output Summary (Last 24 hours) at 02/01/15 1130 Last data filed at 02/01/15 1100  Gross per 24 hour  Intake 1024.7 ml  Output   1220 ml  Net -195.3 ml    PHYSICAL EXAMINATION: General: no distress Neuro: normal strength HEENT: no sinus tenderness Cardiovascular: regular Lungs: no wheeze Abdomen:  Soft, non-tender Musculoskeletal:  No edema Skin: no rashes  LABS:  CBC  Recent Labs Lab 01/31/15 1443 02/01/15 1020  WBC 10.0 7.1  HGB 15.3 13.7  HCT 45.8 41.6  PLT 170 173   Coag's  Recent Labs Lab 01/31/15 1749  APTT 30  INR 1.08   BMET  Recent Labs Lab 01/31/15 1443 02/01/15 0227  NA 135 136  K 5.1 3.9  CL 106 106  CO2 19 22  BUN 28* 21  CREATININE 1.05 0.89  GLUCOSE 300* 172*   Electrolytes  Recent Labs Lab 01/31/15 1443 02/01/15 0227  CALCIUM 9.3 9.0   Cardiac  Enzymes  Recent Labs Lab 01/31/15 1443 01/31/15 2115 02/01/15 0227  TROPONINI <0.03 0.03 0.03   Glucose  Recent Labs Lab 01/31/15 1707 01/31/15 2025 02/01/15 0015 02/01/15 0439 02/01/15 0825  GLUCAP 131* 129* 211* 137* 81    Imaging Ct Angio Chest Pe W/cm &/or Wo Cm  01/31/2015   CLINICAL DATA:  74 year old male with 2 months of moderate generalized weakness and shortness of breath accompanied by nonproductive cough. Symptoms have been progressive.  EXAM: CT ANGIOGRAPHY CHEST WITH CONTRAST  TECHNIQUE: Multidetector CT imaging of the chest was performed using the standard protocol during bolus administration of intravenous contrast. Multiplanar CT image reconstructions and MIPs were obtained to evaluate the vascular anatomy.  CONTRAST:  135mL OMNIPAQUE IOHEXOL 350 MG/ML SOLN  COMPARISON:  Chest x-ray obtained earlier today  FINDINGS: Mediastinum: Unremarkable CT appearance of the thyroid gland. No suspicious mediastinal or hilar adenopathy. No soft tissue mediastinal mass. The thoracic esophagus is unremarkable.  Heart/Vascular: Adequate opacification of the pulmonary arteries to the proximal subsegmental level. Large volume bilateral pulmonary emboli including saddle embolus. Emboli are noted extending into the lobar arteries of all lobes of both lungs as well as segmental and subsegmental branches in the bilateral lower lobes. There is evidence of right heart strain. The RV to LV ratio is elevated at 1.54. No  pericardial effusion. Normal caliber thoracic aorta.  Lungs/Pleura: Negative for pleural effusion. Trace dependent atelectasis in the lower lungs. No evidence of a pulmonary edema, focal airspace consolidation or suspicious pulmonary nodule or mass. Mild bronchial wall thickening.  Bones/Soft Tissues: No acute fracture or aggressive appearing lytic or blastic osseous lesion. Multilevel degenerative spurring throughout the thoracic spine. 9 mm sclerotic focus in the T7 vertebral body  favored to represent a benign bone island.  Upper Abdomen: Water attenuation circumscribed lesion in the medial right hepatic lobe is incompletely imaged but most suggestive of a simple cyst. Otherwise, unremarkable visualized upper abdomen.  Review of the MIP images confirms the above findings.  IMPRESSION: 1. Positive for acute large volume PE with CT evidence of right heart strain (RV/LV Ratio = 1.54) consistent with at least submassive (intermediate risk)PE. The presence of right heart strain has been associated with an increased risk of morbidity and mortality. Consultation with Pulmonary and Critical Care Medicine is recommended. 2. Probable hepatic cyst.  Critical Value/emergent results were called by telephone at the time of interpretation on 01/31/2015 at 5:15 pm to Dr. Virgel Manifold , who verbally acknowledged these results.   Electronically Signed   By: Jacqulynn Cadet M.D.   On: 01/31/2015 17:18   Dg Chest Port 1 View  01/31/2015   CLINICAL DATA:  74 year old male with increasing weakness and shortness of breath x2 months. Initial encounter.  EXAM: PORTABLE CHEST - 1 VIEW  COMPARISON:  05/08/2010 and earlier.  FINDINGS: Portable AP upright view at 1450 hrs. Stable lung volumes. Normal cardiac size and mediastinal contours. Allowing for portable technique, the lungs are clear. No pneumothorax or effusion.  IMPRESSION: No acute cardiopulmonary abnormality.   Electronically Signed   By: Genevie Ann M.D.   On: 01/31/2015 14:57     ASSESSMENT / PLAN:  Acute PE with DVT. Had extensive d/w pt and wife about options for oral anticoagulation. Plan: - will ask pharmacy to discuss xarelto with them - continue heparin gtt for now - they would like to have hematology evaluation done as outpt - f/u Echo - adjust oxygen to keep SpO2 > 92%  Hx of HTN, HLD. Plan: - hold ASA, lasix, inderal, altace, zocor for now  Hx of DM. Plan: - CBG monitoring and SSI - Hold home dapagliflozin, metformin,  victoza  Keep in ICU today >> if stable, then progress out of ICU 3/10.  Chesley Mires, MD [facility] Pulmonary/Critical Care 02/01/2015, 11:41 AM Pager:  [phone] After 3pm call: [phone]

## 2015-02-01 NOTE — Progress Notes (Signed)
  Echocardiogram 2D Echocardiogram has been performed.  Christian [name] 02/01/2015, 8:51 AM

## 2015-02-01 NOTE — Progress Notes (Addendum)
ANTICOAGULATION CONSULT NOTE - Follow Up Consult  Pharmacy Consult:  Heparin Indication:  PE  Allergies  Allergen Reactions  . Oxycodone Other (See Comments)    Mental status changes  . Oxycontin [Oxycodone Hcl] Other (See Comments)    Mental status changes per pt    Patient Measurements: Height: 5' 10.5" (179.1 cm) Weight: 202 lb 13.2 oz (92 kg) IBW/kg (Calculated) : 74.15 Heparin Dosing Weight:  95 kg  Vital Signs: Temp: 97.2 F (36.2 C) (03/09 0434) Temp Source: Axillary (03/09 0434) BP: 106/72 mmHg (03/09 0700) Pulse Rate: 79 (03/09 1000)  Labs:  Recent Labs  01/31/15 1443 01/31/15 1749 01/31/15 2115 02/01/15 0227  HGB 15.3  --   --   --   HCT 45.8  --   --   --   PLT 170  --   --   --   APTT  --  30  --   --   LABPROT  --  14.1  --   --   INR  --  1.08  --   --   HEPARINUNFRC  --   --   --  1.10*  CREATININE 1.05  --   --  0.89  TROPONINI <0.03  --  0.03 0.03    Estimated Creatinine Clearance: 87.5 mL/min (by C-G formula based on Cr of 0.89).     Assessment: 21 YOM with new PE and DVTs to continue on IV heparin.  Heparin level remains supra-therapeutic despite rate adjustment this AM.  No bleeding reported.   Goal of Therapy:  Heparin level 0.3-0.7 units/ml Monitor platelets by anticoagulation protocol: Yes    Plan:  - Decrease heparin gtt to 1300 units/hr - Check 8 hr HL - Daily HL / CBC - F/U ECHO, oral AC when possible    Anushri Casalino D. Mina Marble, PharmD, BCPS Pager:  [phone] 02/01/2015, 12:11 PM   ===============================================   Addendum: - transition patient to Xarelto   Plan: - Xarelto 15mg  PO BIDWM x 21 days, then on 02/23/15 start 20mg  PO daily with supper - PRN CBC and BMET.  Pharmacy will sign off and follow peripherally. - Education provided to patient and family    Belvie Iribe D. Mina Marble, PharmD, BCPS Pager:  [phone] 02/01/2015, 1:35 PM

## 2015-02-01 NOTE — Progress Notes (Signed)
VASCULAR LAB PRELIMINARY  PRELIMINARY  PRELIMINARY  PRELIMINARY  Bilateral lower extremity venous Dopplers completed.    Preliminary report:  There is acute, non occlusive DVT noted in the right popliteal vein and acute, occlusive DVT noted in the bilateral posterior tibial and peroneal veins.   Triniti Gruetzmacher, RVT 02/01/2015, 9:54 AM

## 2015-02-02 LAB — BASIC METABOLIC PANEL
ANION GAP: 4 — AB (ref 5–15)
BUN: 13 mg/dL (ref 6–23)
CHLORIDE: 111 mmol/L (ref 96–112)
CO2: 21 mmol/L (ref 19–32)
CREATININE: 0.77 mg/dL (ref 0.50–1.35)
Calcium: 8.4 mg/dL (ref 8.4–10.5)
GFR calc non Af Amer: 89 mL/min — ABNORMAL LOW (ref >90)
GLUCOSE: 129 mg/dL — AB (ref 70–99)
POTASSIUM: 4.2 mmol/L (ref 3.5–5.1)
SODIUM: 136 mmol/L (ref 135–145)

## 2015-02-02 LAB — BETA-2-GLYCOPROTEIN I ABS, IGG/M/A
Beta-2 Glyco I IgG: <9 GPI IgG units (ref 0–20)
Beta-2-Glycoprotein I IgA: <9 GPI IgA units (ref 0–25)
Beta-2-Glycoprotein I IgM: <9 GPI IgM units (ref 0–32)

## 2015-02-02 LAB — PROTEIN S, TOTAL: Protein S Ag, Total: 169 % — ABNORMAL HIGH (ref 58–150)

## 2015-02-02 LAB — GLUCOSE, CAPILLARY
GLUCOSE-CAPILLARY: 120 mg/dL — AB (ref 70–99)
GLUCOSE-CAPILLARY: 147 mg/dL — AB (ref 70–99)
GLUCOSE-CAPILLARY: 180 mg/dL — AB (ref 70–99)
Glucose-Capillary: 164 mg/dL — ABNORMAL HIGH (ref 70–99)

## 2015-02-02 LAB — LUPUS ANTICOAGULANT PANEL
DRVVT: 51.8 s (ref 0.0–55.1)
PTT Lupus Anticoagulant: 42.1 s (ref 0.0–50.0)

## 2015-02-02 LAB — CBC
HCT: 40.6 % (ref 39.0–52.0)
Hemoglobin: 13.6 g/dL (ref 13.0–17.0)
MCH: 30.2 pg (ref 26.0–34.0)
MCHC: 33.5 g/dL (ref 30.0–36.0)
MCV: 90 fL (ref 78.0–100.0)
Platelets: 144 10*3/uL — ABNORMAL LOW (ref 150–400)
RBC: 4.51 MIL/uL (ref 4.22–5.81)
RDW: 13 % (ref 11.5–15.5)
WBC: 6.5 10*3/uL (ref 4.0–10.5)

## 2015-02-02 LAB — CARDIOLIPIN ANTIBODIES, IGG, IGM, IGA
Anticardiolipin IgA: <9 APL U/mL (ref 0–11)
Anticardiolipin IgG: <9 GPL U/mL (ref 0–14)
Anticardiolipin IgM: <9 MPL U/mL (ref 0–12)

## 2015-02-02 LAB — PROTEIN C, TOTAL: Protein C, Total: 98 % (ref 70–140)

## 2015-02-02 LAB — PROTEIN C ACTIVITY: Protein C Activity: 123 % (ref 74–151)

## 2015-02-02 LAB — PROTEIN S ACTIVITY: Protein S Activity: 109 % (ref 60–145)

## 2015-02-02 MED ORDER — SIMVASTATIN 40 MG PO TABS
40.0000 mg | ORAL_TABLET | Freq: Every day | ORAL | Status: DC
  Administered 2015-02-02: 40 mg via ORAL
  Filled 2015-02-02 (×3): qty 1

## 2015-02-02 MED ORDER — ACETAMINOPHEN 325 MG PO TABS
650.0000 mg | ORAL_TABLET | Freq: Four times a day (QID) | ORAL | Status: DC | PRN
  Administered 2015-02-02 (×2): 650 mg via ORAL
  Filled 2015-02-02 (×2): qty 2

## 2015-02-02 MED ORDER — ASPIRIN 81 MG PO CHEW
81.0000 mg | CHEWABLE_TABLET | Freq: Every day | ORAL | Status: DC
  Administered 2015-02-02 – 2015-02-03 (×2): 81 mg via ORAL
  Filled 2015-02-02 (×2): qty 1

## 2015-02-02 NOTE — Care Management Note (Signed)
    Page 1 of 1   02/03/2015     3:00:00 PM CARE MANAGEMENT NOTE 02/03/2015  Patient:  CHINONSO, LINKER   Account Number:  [id number]  Date Initiated:  02/01/2015  Documentation initiated by:  [facility]  Subjective/Objective Assessment:   Admitted with PE  - found bilaterat DVT's.  On IV heparin.     Action/Plan:   Anticipated DC Date:  02/03/2015   Anticipated DC Plan:  HOME/SELF CARE      DC Planning Services  CM consult  Medication Assistance      Choice offered to / List presented to:             Status of service:  Completed, signed off Medicare Important Message given?  YES (If response is "NO", the following Medicare IM given date fields will be blank) Date Medicare IM given:  02/03/2015 Medicare IM given by:  Labrian Torregrossa Date Additional Medicare IM given:   Additional Medicare IM given by:    Discharge Disposition:  HOME/SELF CARE  Per UR Regulation:  Reviewed for med. necessity/level of care/duration of stay  If discussed at [place] of Stay Meetings, dates discussed:    Comments:  Contact:  [place], Maser Brother [phone]     [place], [place] Spouse [phone]  02/03/15 Ellan Lambert, RN, BSN [phone] Pt for dc home today.  30 day free trial card given to wife; Xarelto needs prior auth, per pharmacy.  Notified MD, who stated he will complete prior auth.  Phone # [phone], ID# [id number].  02/02/15 Ellan Lambert, RN, BSN [phone] Xarelto coverage as follows:  [place] at [phone]. Talked to [place]. [place] [employer] is covered as a TIER 3 medication. $45.00 Retail pharmacy co-payment for a 30 Day Supply. $90.00 Mail Order Pharmacy co-payment for a 90 Day Supply. [facility].    02-01-15 3:45pm Luz Lex, RNBSN - [phone] Started on [employer] referral placed for benefits check.

## 2015-02-02 NOTE — Progress Notes (Signed)
Patient alert and oriented upon arrival, wife at bedside

## 2015-02-02 NOTE — Progress Notes (Signed)
Report called to 3E

## 2015-02-02 NOTE — Progress Notes (Signed)
PULMONARY / CRITICAL CARE MEDICINE   Name: Christian [name] MRN: [id number] DOB: 11-Oct-1941    ADMISSION DATE:  01/31/2015 CONSULTATION DATE:  01/31/2015  REFERRING MD :  [facility] EDP  CHIEF COMPLAINT:  SOB  INITIAL PRESENTATION:  74 yo male transferred from [facility] with progressive dyspnea, weakness from PE and DVT.  STUDIES:  3/8 CTA chest> Large volume PE with evidence of R heart strain (RV/LV = 1.54) 3/9 Doppler Rt arm >> no DVT 3/9 Doppler legs b/l >> DVT Rt popliteal vein and b/l posterior tibial/peroneal veins 3/9 Echo >> EF 45 to 01%, grade 1 diastolic dysfx, mod RV dilation, PAS 52 mmHg  SIGNIFICANT EVENTS: 3/8 presented to ED with SOB, CTA demonstrates large volume PE. Tx to [facility] ICU 3/9 start xarelto 3/10 transfer to tele  SUBJECTIVE:  Denies chest pain.  Breathing okay.  Denies leg pain.  Walked in room yesterday.  VITAL SIGNS: Temp:  [97.6 F (36.4 C)-98.2 F (36.8 C)] 97.6 F (36.4 C) (03/10 0804) Pulse Rate:  [39-81] 68 (03/10 0804) Resp:  [8-32] 14 (03/10 0804) BP: (77-120)/(52-78) 110/62 mmHg (03/10 0804) SpO2:  [95 %-100 %] 98 % (03/10 0804) Weight:  [210 lb 1.6 oz (95.3 kg)] 210 lb 1.6 oz (95.3 kg) (03/10 0400) INTAKE / OUTPUT:  Intake/Output Summary (Last 24 hours) at 02/02/15 1034 Last data filed at 02/02/15 0700  Gross per 24 hour  Intake 1948.5 ml  Output   1750 ml  Net  198.5 ml    PHYSICAL EXAMINATION: General: no distress Neuro: normal strength HEENT: no sinus tenderness Cardiovascular: regular Lungs: no wheeze Abdomen:  Soft, non-tender Musculoskeletal:  No edema Skin: no rashes  LABS:  CBC  Recent Labs Lab 01/31/15 1443 02/01/15 1020 02/02/15 0530  WBC 10.0 7.1 6.5  HGB 15.3 13.7 13.6  HCT 45.8 41.6 40.6  PLT 170 173 144*   Coag's  Recent Labs Lab 01/31/15 1749  APTT 30  INR 1.08   BMET  Recent Labs Lab 01/31/15 1443 02/01/15 0227 02/02/15 0530  NA 135 136 136  K 5.1 3.9 4.2  CL 106 106 111  CO2 19 22 21   BUN  28* 21 13  CREATININE 1.05 0.89 0.77  GLUCOSE 300* 172* 129*   Electrolytes  Recent Labs Lab 01/31/15 1443 02/01/15 0227 02/02/15 0530  CALCIUM 9.3 9.0 8.4   Cardiac Enzymes  Recent Labs Lab 01/31/15 1443 01/31/15 2115 02/01/15 0227  TROPONINI <0.03 0.03 0.03   Glucose  Recent Labs Lab 02/01/15 0015 02/01/15 0439 02/01/15 0825 02/01/15 1146 02/01/15 1708 02/01/15 2141  GLUCAP 211* 137* 81 165* 154* 190*    Imaging No results found.   ASSESSMENT / PLAN:  Acute PE with DVT. Had extensive d/w pt and wife about options for oral anticoagulation. Plan: - continue xarelto - they would like to have hematology evaluation done as outpt - adjust oxygen to keep SpO2 > 92% >> assess for home oxygen needs  Acute systolic RV dysfunction >> enlarged RV is likely cause of low LV EF on Echo. Plan: - will need f/u Echo in several weeks as outpt - continue IV fluids  Hx of HTN, HLD. Plan: - hold outpt lasix, inderal, altace for now - resume low dose ASA, zocor  Hx of DM. Plan: - CBG monitoring and SSI - plan to resume home dapagliflozin, metformin, victoza 3/11  Transfer to telemetry 3/10.  If stable, then possible d/c home 3/11.  Chesley Mires, MD [facility] Pulmonary/Critical Care 02/02/2015, 10:34 AM Pager:  [phone] After 3pm call: [phone]

## 2015-02-02 NOTE — Progress Notes (Signed)
Patient walked in hallway without 02 sats remain upper 90 /9798 %

## 2015-02-02 NOTE — Discharge Summary (Signed)
Physician Discharge Summary       Patient ID: Christian [name] MRN: [id number] DOB/AGE: 1941/07/15 74 y.o.  Admit date: 01/31/2015 Discharge date: 02/02/2015  Discharge Diagnoses:  Active Problems:   PE (pulmonary embolism)   DVT (deep vein thrombosis)    DM type 2 (diabetes mellitus, type 2)   Hypertension   Dementia   Detailed Hospital Course:   74 year old male, former smoker, with PMH as below, which includes DM, HTN, OSA presented to [facility] ED 3/8 complaining of progressive SOB and generalized weakness for the past 2 months. He also reports that he falls frequently (as often as once a week) and had R arm pain one week ago, which he assumed was due to fall. This pain resolved spontaneously overnight several days ago. On the day of presentation SOB was severe while walking to his shed. He had to stop for several breaks and it began to significantly limit his level of activity. Also notes orthopnea and non-productive cough. In ED he was sent for CTA chest which demonstrated large volume PE with evidence of R heart strain. He was started on heparin per PE protocol and transferred to [facility] ICU for the potential of thrombolytic intervention. On arrival to [facility] he was hemodynamically stable and his mental status was intact. Given these details he was treated with intravenous heparin and thrombolysis was deferred. Echo was performed 3/9 and showed EF 45 to 44%, grade 1 diastolic dysfx, mod RV dilation, PAS 52 mmHg. He was transitioned to Xarelto. 3/10 he was transferred out of ICU to telemetry. 3/11 he is deemed a candidate for discharge as he has received maximum inpatient benefit.    Discharge Plan by active problems   Acute PE with DVT. - continue xarelto - adjust oxygen to keep SpO2 > 92% >> assess for home oxygen needs - follow up with hematology as outlined on discharge instructions.   Acute systolic RV dysfunction >> enlarged RV is likely cause of low LV EF on Echo. - Will need  f/u Echo in several weeks as outpatient - Will need to evaluate need for antihypertensives as course progresses.  HTN, HLD - Continue to hold outpt lasix, inderal, altace for now - resume low dose ASA, zocor  DM - resume home dapagliflozin, metformin, victoza   Significant Hospital tests/ studies   STUDIES:  3/8 CTA chest> Large volume PE with evidence of R heart strain (RV/LV = 1.54) 3/9 Doppler Rt arm >> no DVT 3/9 Doppler legs b/l >> DVT Rt popliteal vein and b/l posterior tibial/peroneal veins 3/9 Echo >> EF 45 to 31%, grade 1 diastolic dysfx, mod RV dilation, PAS 52 mmHg  SIGNIFICANT EVENTS: 3/8 presented to ED with SOB, CTA demonstrates large volume PE. Tx to [facility] ICU 3/9 start xarelto 3/10 transfer to tele 3/11 dc home on Xarelto. Consults   Discharge Exam: BP 121/58 mmHg  Pulse 74  Temp(Src) 97.9 F (36.6 C) (Oral)  Resp 18  Ht 5\' 11"  (1.803 m)  Wt 96.026 kg (211 lb 11.2 oz)  BMI 29.54 kg/m2  SpO2 100%  Awake and alert NAD Lungs clear HSR Ext warm  Labs at discharge Lab Results  Component Value Date   CREATININE 0.77 02/02/2015   BUN 13 02/02/2015   NA 136 02/02/2015   K 4.2 02/02/2015   CL 111 02/02/2015   CO2 21 02/02/2015   Lab Results  Component Value Date   WBC 6.5 02/02/2015   HGB 13.6 02/02/2015  HCT 40.6 02/02/2015   MCV 90.0 02/02/2015   PLT 144* 02/02/2015   No results found for: ALT, AST, GGT, ALKPHOS, BILITOT Lab Results  Component Value Date   INR 1.08 01/31/2015    Current radiology studies Ct Angio Chest Pe W/cm &/or Wo Cm  01/31/2015   CLINICAL DATA:  74 year old male with 2 months of moderate generalized weakness and shortness of breath accompanied by nonproductive cough. Symptoms have been progressive.  EXAM: CT ANGIOGRAPHY CHEST WITH CONTRAST  TECHNIQUE: Multidetector CT imaging of the chest was performed using the standard protocol during bolus administration of intravenous contrast. Multiplanar CT image reconstructions  and MIPs were obtained to evaluate the vascular anatomy.  CONTRAST:  14mL OMNIPAQUE IOHEXOL 350 MG/ML SOLN  COMPARISON:  Chest x-ray obtained earlier today  FINDINGS: Mediastinum: Unremarkable CT appearance of the thyroid gland. No suspicious mediastinal or hilar adenopathy. No soft tissue mediastinal mass. The thoracic esophagus is unremarkable.  Heart/Vascular: Adequate opacification of the pulmonary arteries to the proximal subsegmental level. Large volume bilateral pulmonary emboli including saddle embolus. Emboli are noted extending into the lobar arteries of all lobes of both lungs as well as segmental and subsegmental branches in the bilateral lower lobes. There is evidence of right heart strain. The RV to LV ratio is elevated at 1.54. No pericardial effusion. Normal caliber thoracic aorta.  Lungs/Pleura: Negative for pleural effusion. Trace dependent atelectasis in the lower lungs. No evidence of a pulmonary edema, focal airspace consolidation or suspicious pulmonary nodule or mass. Mild bronchial wall thickening.  Bones/Soft Tissues: No acute fracture or aggressive appearing lytic or blastic osseous lesion. Multilevel degenerative spurring throughout the thoracic spine. 9 mm sclerotic focus in the T7 vertebral body favored to represent a benign bone island.  Upper Abdomen: Water attenuation circumscribed lesion in the medial right hepatic lobe is incompletely imaged but most suggestive of a simple cyst. Otherwise, unremarkable visualized upper abdomen.  Review of the MIP images confirms the above findings.  IMPRESSION: 1. Positive for acute large volume PE with CT evidence of right heart strain (RV/LV Ratio = 1.54) consistent with at least submassive (intermediate risk)PE. The presence of right heart strain has been associated with an increased risk of morbidity and mortality. Consultation with Pulmonary and Critical Care Medicine is recommended. 2. Probable hepatic cyst.  Critical Value/emergent results  were called by telephone at the time of interpretation on 01/31/2015 at 5:15 pm to Dr. Virgel Manifold , who verbally acknowledged these results.   Electronically Signed   By: Jacqulynn Cadet M.D.   On: 01/31/2015 17:18    Disposition:  Final discharge disposition not confirmed     Medication List    ASK your doctor about these medications        aspirin 81 MG tablet  Take 81 mg by mouth every evening.     cyclobenzaprine 10 MG tablet  Commonly known as:  FLEXERIL  Take 10 mg by mouth 2 (two) times daily.     dapagliflozin propanediol 10 MG Tabs tablet  Commonly known as:  FARXIGA  Take 10 mg by mouth daily.     donepezil 5 MG tablet  Commonly known as:  ARICEPT  TAKE 1 TABLET ONCE DAILY.     furosemide 80 MG tablet  Commonly known as:  LASIX  Take 80 mg by mouth daily.     ibuprofen 200 MG tablet  Commonly known as:  ADVIL,MOTRIN  Take 200 mg by mouth every 6 (six) hours as needed for mild  pain or moderate pain.     meloxicam 15 MG tablet  Commonly known as:  MOBIC  Take 15 mg by mouth daily.     metFORMIN 500 MG 24 hr tablet  Commonly known as:  GLUCOPHAGE-XR  Take 1,000 mg by mouth 2 (two) times daily.     potassium chloride 10 MEQ CR capsule  Commonly known as:  MICRO-K  Take 10 mEq by mouth daily.     propranolol ER 120 MG 24 hr capsule  Commonly known as:  INDERAL LA  Take 120 mg by mouth daily.     ramipril 5 MG capsule  Commonly known as:  ALTACE  Take 5 mg by mouth daily.     simvastatin 40 MG tablet  Commonly known as:  ZOCOR  Take 40 mg by mouth daily.     traMADol 50 MG tablet  Commonly known as:  ULTRAM  Take 50 mg by mouth 4 (four) times daily as needed. FOR PAIN     VICTOZA 18 MG/3ML Sopn  Generic drug:  Liraglutide  Inject 1.8 mg into the skin daily.       Follow-up Information    Follow up with PARRETT,TAMMY, NP On 02/27/2015.   Specialty:  Nurse Practitioner   Why:  2:00 PM [facility]   Contact information:   [address] [place] [address] [phone]       Follow up with Molli Hazard, MD.   Specialties:  Hematology and Oncology, Oncology   Contact information:   [place] [address] [phone]       Follow up with Dwan Bolt, MD On 02/08/2015.   Specialty:  Endocrinology   Why:  12:00 PM   Contact information:   [address] [place] [address] [phone]       Discharged Condition: good  He will need follow up with hematology in future.   Richardson Landry Minor ACNP Maryanna Shape PCCM Pager [phone] till 3 pm If no answer page [phone] 02/03/2015, 9:19 AM   Chesley Mires, MD [place] 02/03/2015, 10:31 AM Pager:  [phone] After 3pm call: [phone]

## 2015-02-03 LAB — GLUCOSE, CAPILLARY
Glucose-Capillary: 156 mg/dL — ABNORMAL HIGH (ref 70–99)
Glucose-Capillary: 221 mg/dL — ABNORMAL HIGH (ref 70–99)

## 2015-02-03 LAB — BASIC METABOLIC PANEL
ANION GAP: 8 (ref 5–15)
BUN: 12 mg/dL (ref 6–23)
CO2: 23 mmol/L (ref 19–32)
Calcium: 8.7 mg/dL (ref 8.4–10.5)
Chloride: 108 mmol/L (ref 96–112)
Creatinine, Ser: 0.82 mg/dL (ref 0.50–1.35)
GFR calc Af Amer: >90 mL/min (ref >90)
GFR calc non Af Amer: 87 mL/min — ABNORMAL LOW (ref >90)
GLUCOSE: 154 mg/dL — AB (ref 70–99)
Potassium: 4.3 mmol/L (ref 3.5–5.1)
SODIUM: 139 mmol/L (ref 135–145)

## 2015-02-03 LAB — CBC
HCT: 39.6 % (ref 39.0–52.0)
Hemoglobin: 13.4 g/dL (ref 13.0–17.0)
MCH: 30 pg (ref 26.0–34.0)
MCHC: 33.8 g/dL (ref 30.0–36.0)
MCV: 88.8 fL (ref 78.0–100.0)
PLATELETS: 158 10*3/uL (ref 150–400)
RBC: 4.46 MIL/uL (ref 4.22–5.81)
RDW: 13.1 % (ref 11.5–15.5)
WBC: 5.3 10*3/uL (ref 4.0–10.5)

## 2015-02-03 LAB — FACTOR 5 LEIDEN

## 2015-02-03 LAB — PROTHROMBIN GENE MUTATION

## 2015-02-03 MED ORDER — RIVAROXABAN 20 MG PO TABS: 20.0000 mg | ORAL_TABLET | Freq: Every day | ORAL | Status: DC

## 2015-02-03 MED ORDER — RIVAROXABAN 15 MG PO TABS: 15.0000 mg | ORAL_TABLET | Freq: Two times a day (BID) | ORAL | Status: DC

## 2015-02-03 NOTE — Progress Notes (Signed)
Spoke with [employer] for prior authorization for xarelto >> phone number [phone].   Medication approved through 02/03/16.  Work order number >> [id number].  Chesley Mires, MD [facility] Pulmonary/Critical Care 02/03/2015, 1:52 PM Pager:  [phone] After 3pm call: [phone]

## 2015-02-08 ENCOUNTER — Telehealth: Payer: Self-pay | Admitting: Medical

## 2015-02-08 ENCOUNTER — Ambulatory Visit (INDEPENDENT_AMBULATORY_CARE_PROVIDER_SITE_OTHER): Payer: Medicare Other | Admitting: Medical

## 2015-02-08 ENCOUNTER — Encounter: Payer: Self-pay | Admitting: Medical

## 2015-02-08 VITALS — BP 100/70 | HR 68 | Temp 98.0°F | Resp 14 | Wt 208.0 lb

## 2015-02-08 DIAGNOSIS — I82409 Acute embolism and thrombosis of unspecified deep veins of unspecified lower extremity: Secondary | ICD-10-CM | POA: Diagnosis not present

## 2015-02-08 DIAGNOSIS — G4733 Obstructive sleep apnea (adult) (pediatric): Secondary | ICD-10-CM | POA: Diagnosis not present

## 2015-02-08 DIAGNOSIS — E1169 Type 2 diabetes mellitus with other specified complication: Secondary | ICD-10-CM | POA: Diagnosis not present

## 2015-02-08 DIAGNOSIS — G3184 Mild cognitive impairment, so stated: Secondary | ICD-10-CM

## 2015-02-08 DIAGNOSIS — R0602 Shortness of breath: Secondary | ICD-10-CM | POA: Diagnosis not present

## 2015-02-08 DIAGNOSIS — I2699 Other pulmonary embolism without acute cor pulmonale: Secondary | ICD-10-CM | POA: Diagnosis not present

## 2015-02-08 DIAGNOSIS — R296 Repeated falls: Secondary | ICD-10-CM | POA: Diagnosis not present

## 2015-02-08 DIAGNOSIS — G8929 Other chronic pain: Secondary | ICD-10-CM

## 2015-02-08 DIAGNOSIS — M549 Dorsalgia, unspecified: Secondary | ICD-10-CM

## 2015-02-08 DIAGNOSIS — E1159 Type 2 diabetes mellitus with other circulatory complications: Secondary | ICD-10-CM | POA: Insufficient documentation

## 2015-02-08 DIAGNOSIS — E785 Hyperlipidemia, unspecified: Secondary | ICD-10-CM | POA: Diagnosis not present

## 2015-02-08 DIAGNOSIS — I10 Essential (primary) hypertension: Secondary | ICD-10-CM

## 2015-02-08 MED ORDER — RAMIPRIL 10 MG PO CAPS: 10.0000 mg | ORAL_CAPSULE | Freq: Every day | ORAL | Status: DC

## 2015-02-08 NOTE — Addendum Note (Signed)
Addended by: Carlena Hurl on: 02/08/2015 09:45 PM   Modules accepted: Orders, Medications

## 2015-02-08 NOTE — Telephone Encounter (Signed)
Please refer to Dr. Wynonia Lawman as a cardiac follow-up due to recent abnormal echocardiogram and make sure they aware he had a recent pulmonary embolism

## 2015-02-08 NOTE — Telephone Encounter (Signed)
Please set up overnight oximetry test if this hasn't been done.  the indication is pulmonary embolism, shortness of breath

## 2015-02-08 NOTE — Patient Instructions (Addendum)
After reviewing your records here are my recommendations:  Pulmonary embolism and leg blood clots   Continue Xarelto 50 mg twice daily but once he completes the current bottle at day 22, then he will switch to 20 mg once daily  Go and get fitted for the compression hose and wear them daily  Avoid falls, avoid reinjury, avoid trauma to the legs, avoid strenuous activity, avoid extreme cold or heat  Follow-up with hematology as scheduled  Type 2 diabetes  Continue your current medications including Metformin XR 1000mg  BID, Farxiga 10mg  daily, Victoza 1.8mg  daily..  Continue a healthy diabetic diet  Hypertension  increase ramipril to 10 mg daily, decreased Lasix to 40 mg daily, and continue propranolol ER 120 mg daily  Check your weight daily and if you see a 5 pound or greater increase in a single daily, then call us right away  Mild cognitive impairment-continue Aricept and schedule a follow-up with neurology at [facility]  Hyperlipidemia - continue aspirin 81mg  at bedtime along with Simvastain 40mg  daily at bedtime for cholesterol  For chronic back pain you may continue tramadol, Tylenol or Flexeril as needed Pain - advised to stop NSAIDs, mobic, ibuprofen.  Can c/t Tramadol 50mg  prn, Flexeril 10mg  prn.   Stop Mobic, meloxicam, ibuprofen, or any other anti-inflammatories such as Motrin or Aleve as this may interfere with Xarelto  We will set you up for an overnight oximetry test to check your oxygen level

## 2015-02-08 NOTE — Progress Notes (Signed)
Subjective: Here as a new patient today.   His wife [name] sees me as a patient.  Medical team:  Neurology, Evlyn Courier, [facility] at [facility]  Hematology - sees February 27, 2015.  Establishing today with me Dorothea Ogle, PA-C for primary care  Prior PCP was Hoyt Koch MD at [place] - Dr. Geralynn Ochs  Mr. Enyeart is a 74yo male with PMH of diabetes, HTN, OSA, dementia who presents as a new patient for hospital f/u.  Was recently hospitalized at [facility] from 3/8/16t to 02/02/15 for PE and DVT.  Was in ICU on telemetry unit, initially on heparin.  He was discharged on Xarelto.  Went in for SOB progressive over 2 months.  Currently he reports fatigue, stable SOB, leg heaviness at time but leg pain or swelling.  HTN - some of his medications were held while in the hospital. At this time has resumed medications.     DM type 2 - taking Me formin XR 1000mg  BID, farxiga 10mg  daily, Victoza 1.8mg  daily.  Last HgbA1C was a month ago with Dr. Lum Keas.    OSA - compliant with CPAP  Has chronic back pain, uses various things including ultram, ibuprofen, mobic, flexeril   Past Medical History  Diagnosis Date  . Diabetes   . High cholesterol   . Hypertension   . Sleep apnea    ROS as in subjective  Objective: BP 100/70 mmHg  Pulse 68  Temp(Src) 98 F (36.7 C) (Oral)  Resp 14  Wt 208 lb (94.348 kg)  General appearance: alert, no distress, WD/WN HEENT: normocephalic, sclerae anicteric, TMs pearly, nares patent, no discharge or erythema, pharynx normal Oral cavity: MMM, no lesions Neck: supple, no lymphadenopathy, no thyromegaly, no masses Heart: RRR, normal S1, S2, no murmurs Lungs: CTA bilaterally, no wheezes, rhonchi, or rales Abdomen: +bs, soft, non tender, non distended, no masses, no hepatomegaly, no splenomegaly Pulses: 2+ symmetric, upper and lower extremities, normal cap refill Legs nontender, no obvious swelling or palpable cords Ext: no  edema   Assessment: Encounter Diagnoses  Name Primary?  . Acute pulmonary embolism Yes  . DVT (deep venous thrombosis), unspecified laterality   . Type 2 diabetes mellitus with other specified complication   . Essential hypertension   . Hyperlipidemia   . Frequent falls   . SOB (shortness of breath)   . OSA (obstructive sleep apnea)   . Mild cognitive impairment     Plan: PE, DVT - rewviewd hospitalization summary, CT chest, ultrasounds, labs.   He has large volume PE and DVT of right popliteal vein and bilat peroneal and posterior tibial veins from 02/01/15.  Currently on Xarelto 15mg  BID, and advised at day 22 to switch to 20mg  daily.  Advised walking for exercise as tolerated , avoid re injury to legs or new trauma.  discussed fall precautions, can begin compression hose.  discussed likely need to stay on Xarelto lifelong.  F/u with hematology as scheduled and discussed hypercoagulable panel at that visit  Type 2 diabetes - currently on metformin XR 1000mg  BID, Farxiga 10mg  daily, Victoza 1.8mg  daily..  C/t healthy diabetic diet, recent HgbA1c reportedly normal  HTN - he has resumed Propranolol ER 120mg  daily, Ramipril 5mg  daily, lasix 80mg  daily, but advised we increase Ramipril to 10mg  daily and decreased Lasix to 40mg  daily, check daily weights, call if >5lb weight gain in a single day or if worse swelling, begin compression hose.  Script given for ted hose.  Dementia- on  Aricept  Hyperlipidemia - c/t ASA 81mg  QHS along with Simvastain 40mg  QHS  Frequent falls - discussed fall risks, prevention. He declines cane, walker.   SOB - improving.  Discussed gradual return to activity and this will be a slow gradual process  Pain - advised to stop NSAIDs, mobic, ibuprofen.  Can c/t Tramadol 50mg  prn, Flexeril 10mg  prn.   Hematology f/u - has appt in a few weeks  cardiology f/u - I will review records and determine if he needs this follow-up  Neurology f/u - advised that he is due  back for f/u with Evlyn Courier, [facility] at this time.  They will call for appt.   F/u pending call back.

## 2015-02-10 NOTE — Telephone Encounter (Signed)
Patient has an appointment to see Dr. Wynonia Lawman on 02/20/15 @ 300 pm. [address] [place], [place] [address] [phone]

## 2015-02-10 NOTE — Telephone Encounter (Signed)
I FAX OVER REFERRAL FOR OVER OXIMETRY TO [place] FAX # [phone] AND THEY WILL CONTACT THE PATIENT.

## 2015-02-13 NOTE — Telephone Encounter (Signed)
Patient is aware of his appointment and all the details with the appointment

## 2015-02-16 DIAGNOSIS — R0602 Shortness of breath: Secondary | ICD-10-CM | POA: Diagnosis not present

## 2015-02-20 ENCOUNTER — Encounter: Payer: Self-pay | Admitting: Cardiology

## 2015-02-20 ENCOUNTER — Telehealth: Payer: Self-pay | Admitting: Medical

## 2015-02-20 ENCOUNTER — Encounter: Payer: Medicare Other | Admitting: Medical

## 2015-02-20 DIAGNOSIS — Z86718 Personal history of other venous thrombosis and embolism: Secondary | ICD-10-CM

## 2015-02-20 DIAGNOSIS — Z7901 Long term (current) use of anticoagulants: Secondary | ICD-10-CM | POA: Diagnosis not present

## 2015-02-20 DIAGNOSIS — R0602 Shortness of breath: Secondary | ICD-10-CM | POA: Diagnosis not present

## 2015-02-20 DIAGNOSIS — F0390 Unspecified dementia without behavioral disturbance: Secondary | ICD-10-CM | POA: Insufficient documentation

## 2015-02-20 DIAGNOSIS — I10 Essential (primary) hypertension: Secondary | ICD-10-CM | POA: Diagnosis not present

## 2015-02-20 DIAGNOSIS — Z86711 Personal history of pulmonary embolism: Secondary | ICD-10-CM | POA: Diagnosis not present

## 2015-02-20 NOTE — Progress Notes (Signed)
Patient ID: Christian [name], male   DOB: Jul 19, 1941, 74 y.o.   MRN: [id number]   Christian [name], Christian [name]    Date of visit:  02/20/2015 DOB:  1941/08/12    Age:  14 yrs. Medical record number:  [id number]     Account number:  [id number] Primary Care Provider: Robyne Askew ____________________________ CURRENT DIAGNOSES  1. Dyspnea  2. Long term (current) use of anticoagulants  3. Personal History Of Pulmonary Embolism  4. Personal history of other venous thrombosis and embolism  5. Hyperlipidemia  6. Essential hypertension  7. Unspecified Dementia  8. Type 2 diabetes mellitus without complications  9. Overweight ____________________________ ALLERGIES  Codeine, Nausea  insect venom, Swelling (non-specific) ____________________________ MEDICATIONS  1. donepezil 5 mg tablet, 1 p.o. daily  2. Xarelto 20 mg tablet, 1 p.o. daily  3. tramadol 50 mg tablet, PRN  4. metformin ER 500 mg tablet,extended release 24 hr, 2 p.o. b.i.d.  5. Farxiga 10 mg tablet, 1 p.o. daily  6. Victoza 2-Pak 0.6 mg/0.1 mL (18 mg/3 mL) subcutaneous pen injector, qd  7. cyclobenzaprine 10 mg tablet, PRN  8. propranolol ER 60 mg capsule,24 hr,extended release, 1 p.o. daily ____________________________ CHIEF COMPLAINTS  Pulmonary emboli ____________________________ HISTORY OF PRESENT ILLNESS  This 74 year old male is seen at the request of Dorothea Ogle for evaluation of a recent pulmonary emboli and possible cardiac issues. The patient has a complex recent history. He has a prior history of diabetes and also probably has essential tremor. He has evidently been diagnosed with dementia and is on a low dose of Aricept. He normally gets along fairly well but presented to the hospital with bilateral pulmonary emboli and had evidence of right heart strain. He was transferred to [facility] with the idea thrombolysis however thepulmonary care doctors did not feel he needed thrombolyze cysts. An echocardiogram showed PA pressure of  around 50 and mild reduction in his left ventricular ejection fraction. He was treated with Xarelto for pulmonary emboli and also had evidence of bilateral DVT. A followup appointment was made with a hematologist and he was advised to have a repeat echocardiogram down the road. He recently changed primary care providers from Dr. Gareth Eagle to [facility]. Evidently on discharge from the hospital he was hypotensive and a number of his medicines were discontinued. These were evidently restarted as an outpatient possible by the patient. He has become progressively weak and has no energy and still complains of shortness of breath and significant fatigue. He was referred to my office for further followup of his recent pulmonary emboli. His blood pressure was quite low in the office today. He does not have any chest pain suggestive of angina. He has not had any recent edema and was started back on his furosemide, propanolol, and ramipril. He complains of some eructation in his penis and also complains of erectile dysfunction. His wife was back and confirmed a good bit of the history. ____________________________ PAST HISTORY  Past Medical Illnesses:  hypertension, DM-non-insulin dependent, hyperlipidemia, rheumatic fever, DVT, pulmonary embolus, sleep apnea;  Cardiovascular Illnesses:  pulmonary embolus March 2016;  Surgical Procedures:  cataract extraction OS, tonsillectomy;  NYHA Classification:  II;  Canadian Angina Classification:  Class 0: Asymptomatic;  Cardiology Procedures-Invasive:  no history of prior cardiac procedures;  Cardiology Procedures-Noninvasive:  echocardiogram March 2016;  LVEF not documented,   ____________________________ CARDIO-PULMONARY TEST DATES EKG Date:  02/20/2015;   ____________________________ PHYSICAL EXAMINATION VITAL SIGNS  Blood Pressure:  100/60 Sitting, Right arm, regular cuff  ,  104/62 Standing, Right arm and regular cuff , 90/60 retaken by MD  Pulse:  40/min.  Weight:  215.00 lbs. Height:  71"BMI: 30  Constitutional:  pleasant white male in no acute distress, mildly obese Skin:  warm and dry to touch, no apparent skin lesions, or masses noted. Head:  normocephalic, normal hair pattern, no masses or tenderness Eyes:  EOMS Intact, PERRLA, C and S clear, Funduscopic exam not done. ENT:  ears, nose and throat reveal no gross abnormalities.  Dentition good. Neck:  supple, without massess. No JVD, thyromegaly or carotid bruits. Carotid upstroke normal. Chest:  normal symmetry, clear to auscultation. Cardiac:  regular rhythm, normal S1 and S2, No S3 or S4, no murmurs, gallops or rubs detected. Abdomen:  abdomen soft,non-tender, no masses, no hepatospenomegaly, or aneurysm noted Peripheral Pulses:  the femoral,dorsalis pedis, and posterior tibial pulses are full and equal bilaterally with no bruits auscultated. Extremities & Back:  trace edema Neurological:  no gross motor or sensory deficits noted, affect appropriate, oriented x3. ____________________________ IMPRESSIONS/PLAN  1. Recent bilateral pulmonary emboli that was not given thrombolyis 2. Recent DVT 3. Hypotension likely overmedicated today 4. Diabetes mellitus type 2 5. [place] 6. Sleep apnea  Recommendations:  Very difficult to assess at this time. Extensive records recently reviewed. I recommended that he discontinue ramipril, furosemide, potassium, meloxicam because of their potential effects on blood pressure and kidney function. I reduced his propanolol to 60 mg daily to avoid stopping it abruptly.  Because of the potential of bleeding on Xarelto recommended discontinuation of aspirin. I would go ahead and hold the simvastatin for the time being until we are sure how he is going to recover from the recent bilateral pulmonary emboli. He has a hematology appointment coming up soon evidently to investigate the etiology of the pulmonary emboli.  I talked about restricting his  activities somewhat until he has had a chance to resolve his DVT and pulmonary emboli. I would like to see him back in 3 weeks for a repeat echocardiogram to assess his right heart function. Hopefully we can consider whether to add back some of his medications at that time. His EKG today shows left axis deviation, left atrial enlargement and occasional PVCs. ____________________________ TODAYS ORDERS  1. Comprehensive Metabolic Panel: Today  2. Complete Blood Count: Today  3. BNP: Today  4. 2D, color flow, doppler: 3 weeks  5. Return Visit: 3 weeks  6. 12 Lead EKG: Today                       ____________________________ Cardiology Physician:  Kerry Hough MD [facility]

## 2015-02-20 NOTE — Telephone Encounter (Signed)
Patient's wife left a voicemail about the patient being really weak. She wanted to know if this is due to all of the things he has been through? Please, advise before I give her call about the message below.

## 2015-02-20 NOTE — Telephone Encounter (Signed)
LMOM TO CB. CLS 

## 2015-02-20 NOTE — Telephone Encounter (Signed)
If no change from when I saw him, then likely mainly due to the blood clot.  How is he breathing?  If worse, consider recheck.  What other symptoms?

## 2015-02-20 NOTE — Telephone Encounter (Signed)
Oxygen report shows some desaturation but not enough or severe enough to warrant oxygen therapy.   C/t current treatment, CPAP, current medications.  F/u after cardiology followup.

## 2015-02-21 ENCOUNTER — Encounter: Payer: Self-pay | Admitting: Medical

## 2015-02-21 ENCOUNTER — Telehealth: Payer: Self-pay | Admitting: Internal Medicine

## 2015-02-21 NOTE — Telephone Encounter (Signed)
Refill request for true test strips and true plus lancets. Tests 2 times a day. Send to [facility]

## 2015-02-21 NOTE — Telephone Encounter (Signed)
Rx was fax over to [facility]

## 2015-02-21 NOTE — Telephone Encounter (Signed)
lmom to cb. CLS

## 2015-02-22 ENCOUNTER — Other Ambulatory Visit (HOSPITAL_COMMUNITY): Payer: Self-pay | Admitting: Hematology & Oncology

## 2015-02-22 NOTE — Telephone Encounter (Signed)
Appointment scheduled for 02/28/15 @ 145 pm

## 2015-02-22 NOTE — Telephone Encounter (Signed)
Patient's wife said that Dr. Wynonia Lawman said that the patient is over medicated. Dr. Wynonia Lawman changed some of his medications. She states that when he was seen his BP was 90/60. She states that he is still really weak but no other Sx. Just weak. I was also speaking to her about the over night oximetry results. She states that she thinks that it should be done again because that was a bad night for him he only slept for an hour that night. She states that he has an appointment with Dr. Wynonia Lawman on 03/13/15 for a Echo and recheck visit. Please, advise if there is anything additional for the patient

## 2015-02-22 NOTE — Telephone Encounter (Signed)
Plan f/u visit in the next week to double check BP and symptoms

## 2015-02-22 NOTE — Telephone Encounter (Signed)
[name] the patient's wife is aware of [name] tysinger PA message's and she understood them both.

## 2015-02-27 ENCOUNTER — Encounter: Payer: Self-pay | Admitting: Adult Health

## 2015-02-27 ENCOUNTER — Telehealth: Payer: Self-pay | Admitting: Medical

## 2015-02-27 ENCOUNTER — Ambulatory Visit (INDEPENDENT_AMBULATORY_CARE_PROVIDER_SITE_OTHER): Payer: Medicare Other | Admitting: Adult Health

## 2015-02-27 VITALS — BP 130/58 | HR 40 | Temp 97.7°F | Ht 70.0 in | Wt 219.2 lb

## 2015-02-27 DIAGNOSIS — I2699 Other pulmonary embolism without acute cor pulmonale: Secondary | ICD-10-CM

## 2015-02-27 DIAGNOSIS — R001 Bradycardia, unspecified: Secondary | ICD-10-CM | POA: Insufficient documentation

## 2015-02-27 MED ORDER — RIVAROXABAN 20 MG PO TABS: 20.0000 mg | ORAL_TABLET | Freq: Every day | ORAL | Status: DC

## 2015-02-27 NOTE — Telephone Encounter (Signed)
That is fine, cancel for tomorrow

## 2015-02-27 NOTE — Assessment & Plan Note (Signed)
Bradycardia persists depsite lower dose of propranolol .  B/p is improved off ramipril .  Would cont follow up with cards and if HR persists may need to further taper off BB slowly .

## 2015-02-27 NOTE — Progress Notes (Signed)
   Subjective:    Patient ID: Christian [name], male    DOB: Apr 20, 1941, 74 y.o.   MRN: [id number]  HPI 74 yo male admitted with PE by [facility] 01/31/15    02/27/2015 [facility] follow up  Pt returns for post hospital follow up .  Admitted 3/8-3/10/16 for Acute PE,and DVT.  CTA chest showed large volume PE w/ evidence of R. Heart strain.  Echo showed EF of 45-50%, gr 1 DD , mod RV dilation, PAP 65mmHg.  tx w/ Heparin w/ transition to Xarelto.  He was seen by cardiology last week, has echo in 2 weeks.  Several meds were adjusted due to sign bradycardia and hypotension Ramipril and Lasix were stopped and Propranolol decreased dose.  HR remains low today in low 40s.  Says his weakness and dizziness are some better He is less dyspneic.   He denies having SOB, wheezing, chest tightness/congestion, n/v/d.  He has been set up with hematology due to unprovoked PE /DVT .  Has follow up next week.  No chest pain, orthopnea, edema , hemotpysis , bleeding or fever.  DOE is getting better.    Review of Systems Constitutional:   No  weight loss, night sweats,  Fevers, chills,  +fatigue, or  lassitude.  HEENT:   No headaches,  Difficulty swallowing,  Tooth/dental problems, or  Sore throat,                No sneezing, itching, ear ache, nasal congestion, post nasal drip,   CV:  No chest pain,  Orthopnea, PND, swelling in lower extremities, anasarca, dizziness, palpitations, syncope.   GI  No heartburn, indigestion, abdominal pain, nausea, vomiting, diarrhea, change in bowel habits, loss of appetite, bloody stools.   Resp  No excess mucus, no productive cough,  No non-productive cough,  No coughing up of blood.  No change in color of mucus.  No wheezing.  No chest wall deformity  Skin: no rash or lesions.  GU: no dysuria, change in color of urine, no urgency or frequency.  No flank pain, no hematuria   MS:  No joint pain or swelling.  No decreased range of motion.  No back pain.  Psych:  No  change in mood or affect. No depression or anxiety.  No memory loss.         Objective:   Physical Exam GEN: A/Ox3; pleasant , NAD, elderly   HEENT:  [place]/AT,  EACs-clear, TMs-wnl, NOSE-clear, THROAT-clear, no lesions, no postnasal drip or exudate noted.   NECK:  Supple w/ fair ROM; no JVD; normal carotid impulses w/o bruits; no thyromegaly or nodules palpated; no lymphadenopathy.  RESP  Decreased BS in bases , no accessory muscle use, no dullness to percussion  CARD:  RRR, no m/r/g  , no peripheral edema, pulses intact, no cyanosis or clubbing.  GI:   Soft & nt; nml bowel sounds; no organomegaly or masses detected.  Musco: Warm bil, no deformities or joint swelling noted.   Neuro: alert, no focal deficits noted.    Skin: Warm, no lesions or rashes         Assessment & Plan:

## 2015-02-27 NOTE — Patient Instructions (Addendum)
Continue Xarelto 20mg  daily .  Follow up with Hematology as planned next week.  Follow up with cardiology for echo and bradycardia(low heart rate)  follow up Dr. Halford Chessman  In 2 months and As needed  Please contact office for sooner follow up if symptoms do not improve or worsen or seek emergency care

## 2015-02-27 NOTE — Assessment & Plan Note (Addendum)
Unprovoked PE /DVT doing okay on Xarelto Had planned follow up with cards for echo  Has planned hematology follow up   Glasco 20mg  daily .  Follow up with Hematology as planned next week.  Follow up with cardiology for echo and bradycardia(low heart rate)  follow up Dr. Halford Chessman  In 2 months and As needed  Please contact office for sooner follow up if symptoms do not improve or worsen or seek emergency care

## 2015-02-27 NOTE — Telephone Encounter (Signed)
Pt's spouse, [name], called to find out pt is being seen for blood pressure ck at appt on tomorrow. Advise he is being seen for a follow up on BP so he will be seeing Shane. Wife says pt is seeing pulmonary doctor today and seeing [name] again next week so they want to cancel BP follow for tomorrow. Wife says pulmonary doctor will be checking bp today and pt will be back in with [facility] tomorrow and they feel that Shane's appts can be combined if he can not review bp reading from pulmonary doctor. Pt lives in [place] and would prefer not to come to [place] so often for appts.

## 2015-02-28 ENCOUNTER — Ambulatory Visit: Payer: Medicare Other | Admitting: Medical

## 2015-02-28 NOTE — Progress Notes (Signed)
Reviewed and agree with assessment/plan. 

## 2015-03-01 ENCOUNTER — Telehealth: Payer: Self-pay | Admitting: Family Medicine

## 2015-03-01 NOTE — Telephone Encounter (Signed)
Wife called and said her husband saw pulmonology on Monday and she states that his BP was 130 /70 but the pulse 2 times was in the 40's. She states that the patient seems confused like he doesn't know what is going on and she wants to know what they should do? Please advise

## 2015-03-01 NOTE — Telephone Encounter (Signed)
Have them call Dr. Thurman Coyer office to get back in this week for f/u.

## 2015-03-02 ENCOUNTER — Telehealth: Payer: Self-pay | Admitting: Medical

## 2015-03-02 ENCOUNTER — Telehealth: Payer: Self-pay | Admitting: Internal Medicine

## 2015-03-02 NOTE — Telephone Encounter (Signed)
Called insurance company at [employer] [phone] ID# [id number], states preferred drugs are Invokana & Jardiance & they will cost pt $45 a month. Can you switch?

## 2015-03-02 NOTE — Telephone Encounter (Signed)
Wife called & states went to pick up [place] was $180 when it was only $50 before. Wife states they can't afford that.   Called pharmacy & states last time they picked up was 12/15 & was under catastrophic coverage & 90 days was $50.  Now ins pays half & pt responsible for half so cost is $181 for 30 days.

## 2015-03-02 NOTE — Telephone Encounter (Signed)
Christian [name], he has appt here Monday. Wife says she called Dr Ezekiel Slocumb office and they said to call you. I told her you could discuss on Monday. His farxiga is 180/mos. They can not pay this much. Pharmacist says ins will cover Metformin, Invokana, and Jardiance, please advise

## 2015-03-02 NOTE — Telephone Encounter (Signed)
Fax from pharmacy stating that pt's farxiga is nonpreferred on insurance and want to know if they can change the rx to invokana. Send new med to [facility]

## 2015-03-03 MED ORDER — CANAGLIFLOZIN 100 MG PO TABS: 100.0000 mg | ORAL_TABLET | Freq: Every day | ORAL | Status: DC

## 2015-03-03 NOTE — Telephone Encounter (Signed)
[name] called in invokana 100mg  qd to pharmacy

## 2015-03-03 NOTE — Telephone Encounter (Signed)
Pt called again concerning medication. [name] was called and he stated medication should be changed to Invokana 100 MG QD. Medication was called into pt's pharmacy, [facility]. Pt was informed of change.

## 2015-03-06 ENCOUNTER — Ambulatory Visit (INDEPENDENT_AMBULATORY_CARE_PROVIDER_SITE_OTHER): Payer: Medicare Other | Admitting: Medical

## 2015-03-06 ENCOUNTER — Encounter: Payer: Self-pay | Admitting: Medical

## 2015-03-06 VITALS — BP 122/60 | HR 40 | Temp 97.7°F | Resp 14 | Ht 70.0 in | Wt 220.0 lb

## 2015-03-06 DIAGNOSIS — R001 Bradycardia, unspecified: Secondary | ICD-10-CM

## 2015-03-06 DIAGNOSIS — I10 Essential (primary) hypertension: Secondary | ICD-10-CM | POA: Diagnosis not present

## 2015-03-06 DIAGNOSIS — G8929 Other chronic pain: Secondary | ICD-10-CM

## 2015-03-06 DIAGNOSIS — Z973 Presence of spectacles and contact lenses: Secondary | ICD-10-CM | POA: Diagnosis not present

## 2015-03-06 DIAGNOSIS — H02401 Unspecified ptosis of right eyelid: Secondary | ICD-10-CM | POA: Diagnosis not present

## 2015-03-06 DIAGNOSIS — H9193 Unspecified hearing loss, bilateral: Secondary | ICD-10-CM

## 2015-03-06 DIAGNOSIS — E1165 Type 2 diabetes mellitus with hyperglycemia: Secondary | ICD-10-CM

## 2015-03-06 DIAGNOSIS — I2699 Other pulmonary embolism without acute cor pulmonale: Secondary | ICD-10-CM | POA: Diagnosis not present

## 2015-03-06 DIAGNOSIS — G4700 Insomnia, unspecified: Secondary | ICD-10-CM

## 2015-03-06 DIAGNOSIS — M79606 Pain in leg, unspecified: Secondary | ICD-10-CM

## 2015-03-06 DIAGNOSIS — Z9989 Dependence on other enabling machines and devices: Secondary | ICD-10-CM

## 2015-03-06 DIAGNOSIS — E785 Hyperlipidemia, unspecified: Secondary | ICD-10-CM

## 2015-03-06 DIAGNOSIS — Z7185 Encounter for immunization safety counseling: Secondary | ICD-10-CM | POA: Insufficient documentation

## 2015-03-06 DIAGNOSIS — E119 Type 2 diabetes mellitus without complications: Secondary | ICD-10-CM | POA: Diagnosis not present

## 2015-03-06 DIAGNOSIS — M549 Dorsalgia, unspecified: Secondary | ICD-10-CM

## 2015-03-06 DIAGNOSIS — H9190 Unspecified hearing loss, unspecified ear: Secondary | ICD-10-CM | POA: Insufficient documentation

## 2015-03-06 DIAGNOSIS — Z7189 Other specified counseling: Secondary | ICD-10-CM | POA: Insufficient documentation

## 2015-03-06 DIAGNOSIS — Z0000 Encounter for general adult medical examination without abnormal findings: Secondary | ICD-10-CM

## 2015-03-06 DIAGNOSIS — R609 Edema, unspecified: Secondary | ICD-10-CM | POA: Diagnosis not present

## 2015-03-06 DIAGNOSIS — IMO0002 Reserved for concepts with insufficient information to code with codable children: Secondary | ICD-10-CM

## 2015-03-06 DIAGNOSIS — M545 Low back pain, unspecified: Secondary | ICD-10-CM

## 2015-03-06 DIAGNOSIS — M4806 Spinal stenosis, lumbar region: Secondary | ICD-10-CM

## 2015-03-06 DIAGNOSIS — G3184 Mild cognitive impairment, so stated: Secondary | ICD-10-CM

## 2015-03-06 DIAGNOSIS — I82403 Acute embolism and thrombosis of unspecified deep veins of lower extremity, bilateral: Secondary | ICD-10-CM

## 2015-03-06 DIAGNOSIS — M48061 Spinal stenosis, lumbar region without neurogenic claudication: Secondary | ICD-10-CM | POA: Insufficient documentation

## 2015-03-06 DIAGNOSIS — R251 Tremor, unspecified: Secondary | ICD-10-CM

## 2015-03-06 DIAGNOSIS — G4733 Obstructive sleep apnea (adult) (pediatric): Secondary | ICD-10-CM | POA: Diagnosis not present

## 2015-03-06 DIAGNOSIS — H02409 Unspecified ptosis of unspecified eyelid: Secondary | ICD-10-CM | POA: Insufficient documentation

## 2015-03-06 DIAGNOSIS — R6889 Other general symptoms and signs: Secondary | ICD-10-CM

## 2015-03-06 DIAGNOSIS — Z1331 Encounter for screening for depression: Secondary | ICD-10-CM

## 2015-03-06 LAB — HEMOGLOBIN A1C
Hgb A1c MFr Bld: 7.9 % — ABNORMAL HIGH (ref <5.7)
MEAN PLASMA GLUCOSE: 180 mg/dL — AB (ref <117)

## 2015-03-06 MED ORDER — EPINEPHRINE 0.3 MG/0.3ML IJ SOAJ: 0.3000 mg | Freq: Once | INTRAMUSCULAR | Status: DC
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
## 2015-03-06 NOTE — Telephone Encounter (Signed)
He has appt today, and we can discuss further

## 2015-03-06 NOTE — Patient Instructions (Addendum)
MEDICARE PREVENTATIVE SERVICES (MALE) AND PERSONALIZED PLAN for  Christian [name] March 06, 2015  CONDITIONS OR RISKS IDENTIFIED TODAY: Encounter Diagnoses  Name Primary?  . Pulmonary emboli Yes  . Bradycardia   . Essential hypertension   . Diabetes type 2, uncontrolled   . Hyperlipidemia   . OSA on CPAP   . Edema   . Leg pain, diffuse, unspecified laterality   . Bilateral low back pain without sciatica   . Spinal stenosis of lumbar region   . Ptosis, right   . Wears glasses   . Hearing loss, bilateral   . Chronic back pain   . Vaccine counseling   . Tremor   . Insomnia   . Encounter for health maintenance examination in adult   . Positive depression screening   . DVT (deep venous thrombosis), bilateral   . Mild cognitive impairment   . Advance directive discussed with patient     SPECIFIC RECOMMENDATIONS:  See your dentist yearly for routine dental care including hygiene visits twice yearly.  See your eye doctor yearly for routine vision care.  Pulmonary embolism -continue Xarelto daily, continue compression hose for the legs  bradycardia- change to Propranolol 20mg  twice daily for a week, then stop this.   folllow up with Dr. Thurman Coyer appointment as planned  Hypertension-for now continue to hold off taking Ramipril, Lasix and potassium  Diabetes type 2 - we will call with lab results.  Continue Victoza daily,  metformin 500 mg 2 tablets twice daily, and change from [place] to Invokana once daily  Hyperlipidemia - still holding off on taking simvastatin and aspirin per Dr. Thurman Coyer instructions given his recent weakness and fatigue  continue CPAP  Edema - Lasix on hold for now given the bradycardia  Hearing loss - consider hearing clinic evaluation  Insomnia - advised he can use Tylenol PM if desired which is what he was taking prior  +depression screen - consider follow-up to discuss this further  Advanced directives  - look into completing living will and  healthcare power of attorney   Influenza vaccine: get your flu vaccine yearly Pneumococcal vaccine: I recommend the second pneumococcal vaccine, which will likely be Prevnar 13 in your case.  Please check insurance coverage for this Shingles vaccine: 2014 per your recollection, up to date Tdap vaccine: I recommend we update this if it has been > 10 years.  Please check insurance for copay/coverage  Colonoscopy: 2014 per your recollection  Try and get some walking calisthenics for exercise regularly Eat a healthy diet.  Return pending labs   GENERAL RECOMMENDATIONS FOR GOOD HEALTH:  Supplements:  . Consume 1200 mg of Calcium daily through dietary calcium or supplement if you are male age 45 or older, or men 47 and older.   Men aged 9-70 should consume 1000 mg of Calcium daily. . Take 600 IU of Vitamin D daily.  Take 800 IU of Calcium daily if you are older than age 34.  . Take a general multivitamin daily.   Healthy diet: Eat a variety of foods, including fruits, vegetables, vegetable protein such as beans, lentils, tofu, and grains, such as rice.  Limit meat or animal protein, but if you eat meat, choose leans cuts such as chicken, fish, or [place].  Drink plenty of water daily.  Decrease saturated fat in the diet, avoid lots of red meat, processed foods, sweets, fast foods, and fried foods.  Limit salt and caffeine intake.  Exercise: Aerobic exercise helps maintain good heart health.  Weight bearing exercise helps keep bones and muscles working strong.  We recommend at least 30-40 minutes of exercise most days of the week.   Fall prevention: Falls are the leading cause of injuries, accidents, and accidental deaths in people over the age of 90. Falling is a real threat to your ability to live on your own.  Causes include poor eyesight or poor hearing, illness, poor lighting, throw rugs, clutter in your home, and medication side effects causing dizziness or balance problems.  Such  medications can include medications for depression, sleep problems, high blood pressure, diabetes, and heart conditions.   PREVENTION  Be sure your home is as safe as possible. Here are some tips:  Wear shoes with non-skid soles (not house slippers).   Be sure your home and outside area are well lit.   Use night lights throughout your house, including hallways and stairways.   Remove clutter and clean up spills on floors and walkways.   Remove throw rugs or fasten them to the floor with carpet tape. Tack down carpet edges.   Do not place electrical cords across pathways.   Install grab bars in your bathtub, shower, and toilet area. Towel bars should not be used as a grab bar.   Install handrails on both sides of stairways.   Do not climb on stools or stepladders. Get someone else to help with jobs that require climbing.   Do not wax your floors at all, or use a non-skid wax.   Repair uneven or unsafe sidewalks, walkways or stairs.   Keep frequently used items within reach.   Be aware of pets so you do not trip.  Get regular check-ups from your doctor, and take good care of yourself:  Have your eyes checked every year for vision changes, cataracts, glaucoma, and other eye problems. Wear eyeglasses as directed.   Have your hearing checked every 2 years, or anytime you or others think that you cannot hear well. Use hearing aids as directed.   See your caregiver if you have foot pain or corns. Sore feet can contribute to falls.   Let your caregiver know if a medicine is making you feel dizzy or making you lose your balance.   Use a cane, walker, or wheelchair as directed. Use walker or wheelchair brakes when getting in and out.   When you get up from bed, sit on the side of the bed for 1 to 2 minutes before you stand up. This will give your blood pressure time to adjust, and you will feel less dizzy.   If you need to go to the bathroom often, consider using a bedside  commode.  Disease prevention:  If you smoke or chew tobacco, find out from your caregiver how to quit. It can literally save your life, no matter how long you have been a tobacco user. If you do not use tobacco, never begin. Medicare does cover some smoking cessation counseling.  Maintain a healthy diet and normal weight. Increased weight leads to problems with blood pressure and diabetes. We check your height, weight, and BMI as part of your yearly visit.  The Body Mass Index or BMI is a way of measuring how much of your body is fat. Having a BMI above 27 increases the risk of heart disease, diabetes, hypertension, stroke and other problems related to obesity. Your caregiver can help determine your BMI and based on it develop an exercise and dietary program to help you achieve or maintain this  important measurement at a healthful level.  High blood pressure causes heart and blood vessel problems.  Persistent high blood pressure should be treated with medicine if weight loss and exercise do not work.  We check your blood pressure as part of your yearly visit.  Avoid drinking alcohol in excess (more than two drinks per day).  Avoid use of street drugs. Do not share needles with anyone. Ask for professional help if you need assistance or instructions on stopping the use of alcohol, cigarettes, and/or drugs.  Brush your teeth twice a day with fluoride toothpaste, and floss once a day. Good oral hygiene prevents tooth decay and gum disease. The problems can be painful, unattractive, and can cause other health problems. Visit your dentist for a routine oral and dental checkup and preventive care every 6-12 months.   See your eye doctor yearly for routine screening for things like glaucoma.  Look at your skin regularly.  Use a mirror to look at your back. Notify your caregivers of changes in moles, especially if there are changes in shapes, colors, a size larger than a pencil eraser, an irregular border,  or development of new moles.  Safety:  Use seatbelts 100% of the time, whether driving or as a passenger.  Use safety devices such as hearing protection if you work in environments with loud noise or significant background noise.  Use safety glasses when doing any work that could send debris in to the eyes.  Use a helmet if you ride a bike or motorcycle.  Use appropriate safety gear for contact sports.  Talk to your caregiver about gun safety.  Use sunscreen with a SPF (or skin protection factor) of 15 or greater.  Lighter skinned people are at a greater risk of skin cancer. Don't forget to also wear sunglasses in order to protect your eyes from too much damaging sunlight. Damaging sunlight can accelerate cataract formation.   If you have multiple sexual partners, or if you are not in a monogamous relationship, practice safe sex. Use condoms. Condoms are used to help reduce the spread of sexually transmitted infections (or STIs).  Consider an HIV test if you have never been tested.  Consider routine screening for STIs if you have multiple sexual partners.   Keep carbon monoxide and smoke detectors in your home functioning at all times. Change the batteries every 6 months or use a model that plugs into the wall or is hard wired in.   END OF LIFE PLANNING/ADVANCED DIRECTIVES Advance health-care planning is deciding the kind of care you want at the end of life. While alert competent adults are able to exercise their rights to make health care and financial decisions, problems arise when an individual becomes unconscious, incapacitated, or otherwise unable to communicate or make such decisions. Advance health care directives are the legal documents in which you give written instructions about your choices limited, aggressive or palliative care if, in the future, you cannot speak for yourself.  Advanced directives include the following: [place] allows you to appoint someone to act  as your health care agent to make health care decisions for you should it be determined by your health care provider that you are no longer able to make these decisions for yourself.  A Living Will is a legal document in which you can declare that under certain conditions you desire your life not be prolonged by extraordinary or artificial means during your last illness or when you are  near death. We can provide you with sample advanced directives, you can get an attorney to prepare these for you, or you can visit [place] Secretary of State's website for additional information and resources at [email or web address]  Further, I recommend you have an attorney prepare a Will and Durable Power of Attorney if you haven't done so already.  Please get [place] a copy of your health care Advanced Directives.   PREVENTATIVE CARE RECOMMENDATIONS:  Vaccinations: We recommend the following vaccinations as part of your preventative care:  Pneumococcal vaccine is recommended to protect against certain types of pneumonia.  This is normally recommended for adults age 2 or older once, or up to every 5 years for those at high risk.  The vaccine is also recommended for adults younger than 72 years old with certain underlying conditions that make them high risk for pneumonia.  Influenza vaccine is recommended to protect against seasonal influenza or "the flu." Influenza is a serious disease that can lead to hospitalization and sometimes even death. Traditional flu vaccines (called trivalent vaccines) are made to protect against three flu viruses; an influenza A (H1N1) virus, an influenza A (H3N2) virus, and an influenza B virus. In addition, there are flu vaccines made to protect against four flu viruses (called "quadrivalent" vaccines). These vaccines protect against the same viruses as the trivalent vaccine and an additional B virus.  We recommend the high dose influenza vaccine to those 65 years and  older.  Hepatitis B vaccine to protect against a form of infection of the liver by a virus acquired from blood or body fluids, particularly for high risk groups.  Td or Tdap vaccine to protect against Tetanus, diphtheria and pertussis which can be very serious.  These diseases are caused by bacteria.  Diphtheria and pertussis are spread from person to person through coughing or sneezing.  Tetanus enters the body through cuts, scratches, or wounds.  Tetanus (Lockjaw) causes painful muscle tightening and stiffness, usually all over the body.  Diphtheria can cause a thick coating to form in the back of the throat.  It can lead to breathing problems, paralysis, heart failure, and death.  Pertussis (Whooping Cough) causes severe coughing spells, which can cause difficulty breathing, vomiting and disturbed sleep.  Td or Tdap is usually given every 10 years.  Shingles vaccine to protect against Varicella Zoster if you are older than age 28, or younger than 72 years old with certain underlying illness.

## 2015-03-06 NOTE — Telephone Encounter (Signed)
Printed to place on chart for today

## 2015-03-07 ENCOUNTER — Other Ambulatory Visit: Payer: Self-pay | Admitting: Medical

## 2015-03-07 LAB — COMPREHENSIVE METABOLIC PANEL
ALK PHOS: 67 U/L (ref 39–117)
ALT: 12 U/L (ref 0–53)
AST: 14 U/L (ref 0–37)
Albumin: 3.9 g/dL (ref 3.5–5.2)
BUN: 18 mg/dL (ref 6–23)
CHLORIDE: 104 meq/L (ref 96–112)
CO2: 27 mEq/L (ref 19–32)
Calcium: 9.4 mg/dL (ref 8.4–10.5)
Creat: 0.89 mg/dL (ref 0.50–1.35)
Glucose, Bld: 156 mg/dL — ABNORMAL HIGH (ref 70–99)
POTASSIUM: 5 meq/L (ref 3.5–5.3)
SODIUM: 138 meq/L (ref 135–145)
Total Bilirubin: 0.4 mg/dL (ref 0.2–1.2)
Total Protein: 6.3 g/dL (ref 6.0–8.3)

## 2015-03-07 LAB — MICROALBUMIN / CREATININE URINE RATIO
Creatinine, Urine: 56.1 mg/dL
Microalb, Ur: <0.2 mg/dL (ref <2.0)

## 2015-03-07 MED ORDER — PROPRANOLOL HCL 20 MG PO TABS: 20.0000 mg | ORAL_TABLET | Freq: Two times a day (BID) | ORAL | Status: DC

## 2015-03-08 ENCOUNTER — Ambulatory Visit (HOSPITAL_COMMUNITY): Payer: Medicare Other | Admitting: Hematology & Oncology

## 2015-03-09 ENCOUNTER — Encounter: Payer: Self-pay | Admitting: Internal Medicine

## 2015-03-09 ENCOUNTER — Other Ambulatory Visit: Payer: Self-pay | Admitting: Neurology

## 2015-03-09 NOTE — Progress Notes (Signed)
LM to [facility] 

## 2015-03-09 NOTE — Telephone Encounter (Signed)
No showed last appt  

## 2015-03-13 DIAGNOSIS — Z7901 Long term (current) use of anticoagulants: Secondary | ICD-10-CM | POA: Diagnosis not present

## 2015-03-13 DIAGNOSIS — R001 Bradycardia, unspecified: Secondary | ICD-10-CM | POA: Diagnosis not present

## 2015-03-13 DIAGNOSIS — Z86711 Personal history of pulmonary embolism: Secondary | ICD-10-CM | POA: Diagnosis not present

## 2015-03-13 DIAGNOSIS — R0600 Dyspnea, unspecified: Secondary | ICD-10-CM | POA: Diagnosis not present

## 2015-03-13 DIAGNOSIS — I2699 Other pulmonary embolism without acute cor pulmonale: Secondary | ICD-10-CM | POA: Diagnosis not present

## 2015-03-14 ENCOUNTER — Encounter (HOSPITAL_COMMUNITY): Payer: Medicare Other | Attending: Hematology & Oncology | Admitting: Hematology & Oncology

## 2015-03-14 VITALS — BP 132/52 | HR 41 | Temp 98.8°F | Resp 18 | Ht 69.0 in | Wt 222.9 lb

## 2015-03-14 DIAGNOSIS — I2699 Other pulmonary embolism without acute cor pulmonale: Secondary | ICD-10-CM | POA: Diagnosis not present

## 2015-03-14 DIAGNOSIS — Z7901 Long term (current) use of anticoagulants: Secondary | ICD-10-CM

## 2015-03-14 DIAGNOSIS — D6852 Prothrombin gene mutation: Secondary | ICD-10-CM

## 2015-03-14 DIAGNOSIS — I82403 Acute embolism and thrombosis of unspecified deep veins of lower extremity, bilateral: Secondary | ICD-10-CM

## 2015-03-14 DIAGNOSIS — Z87891 Personal history of nicotine dependence: Secondary | ICD-10-CM

## 2015-03-14 MED ORDER — RIVAROXABAN 20 MG PO TABS: 20.0000 mg | ORAL_TABLET | Freq: Every day | ORAL | Status: DC

## 2015-03-14 NOTE — Patient Instructions (Signed)
[place] at [facility] Discharge Instructions  RECOMMENDATIONS MADE BY THE CONSULTANT AND ANY TEST RESULTS WILL BE SENT TO YOUR REFERRING PHYSICIAN.  Return to clinic in 1 month for MD appointment.  Thank you for choosing [place] at [facility] to provide your oncology and hematology care.  To afford each patient quality time with our provider, please arrive at least 15 minutes before your scheduled appointment time.    You need to re-schedule your appointment should you arrive 10 or more minutes late.  We strive to give you quality time with our providers, and arriving late affects you and other patients whose appointments are after yours.  Also, if you no show three or more times for appointments you may be dismissed from the clinic at the providers discretion.     Again, thank you for choosing [facility].  Our hope is that these requests will decrease the amount of time that you wait before being seen by our physicians.       _____________________________________________________________  Should you have questions after your visit to [facility], please contact our office at [phone] between the hours of 8:30 a.m. and 4:30 p.m.  Voicemails left after 4:30 p.m. will not be returned until the following business day.  For prescription refill requests, have your pharmacy contact our office.
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
## 2015-03-16 ENCOUNTER — Encounter: Payer: Self-pay | Admitting: Medical

## 2015-03-22 ENCOUNTER — Ambulatory Visit (INDEPENDENT_AMBULATORY_CARE_PROVIDER_SITE_OTHER): Payer: Medicare Other | Admitting: Medical

## 2015-03-22 ENCOUNTER — Encounter: Payer: Self-pay | Admitting: Medical

## 2015-03-22 VITALS — BP 120/60 | HR 40 | Temp 98.5°F | Wt 224.0 lb

## 2015-03-22 DIAGNOSIS — I2699 Other pulmonary embolism without acute cor pulmonale: Secondary | ICD-10-CM | POA: Diagnosis not present

## 2015-03-22 DIAGNOSIS — R251 Tremor, unspecified: Secondary | ICD-10-CM | POA: Diagnosis not present

## 2015-03-22 DIAGNOSIS — R5383 Other fatigue: Secondary | ICD-10-CM | POA: Diagnosis not present

## 2015-03-22 DIAGNOSIS — R0602 Shortness of breath: Secondary | ICD-10-CM | POA: Diagnosis not present

## 2015-03-22 DIAGNOSIS — E119 Type 2 diabetes mellitus without complications: Secondary | ICD-10-CM

## 2015-03-22 DIAGNOSIS — I82403 Acute embolism and thrombosis of unspecified deep veins of lower extremity, bilateral: Secondary | ICD-10-CM

## 2015-03-22 DIAGNOSIS — R001 Bradycardia, unspecified: Secondary | ICD-10-CM

## 2015-03-22 NOTE — Progress Notes (Signed)
Subjective Here with wife today to discuss his persistent fatigue, SOB, lack of energy, and even mild activity of routine day to day ADLs in his home cause SOB and fatigue.  He is compliant with medications.  Drinking same amount of water, and checking glucose which has been consistent and in 130 or less range in the mornings.  Pulse remains in the 40s.  Since last visit here has seen hematology and cardiology.  At recent cardiology visit was told that echocardiogram looked good, advised to c/t Propranolol 20mg  once daily and f/u in 49mo.  At recent hematology visit, there understanding was that hematology didn't understand how he made it and was still alive given the clots.  His siblings and kids are going to be tested for blood clotting gene since he has a genetic finding from recent test in relation to this type of clot.   He is still taking Xarelto.  There main questions today is how long until his energy level and SOB improve and when will the bradycardia resole.  He notes if not on propranolol the tremor is much worse and is worse since weaning down in dowse.  He also reports generalized pains since the PE diagnosis in the knees, back, and just aches all over.  No other aggravating or relieving factors. No other complaint.  ROS as in subjective  Past Medical History  Diagnosis Date  . High cholesterol   . Hypertension   . OSA on CPAP   . Insomnia   . Pulmonary embolism 01/2015  . DVT (deep venous thrombosis) 01/2015  . Diabetes     type 2  . Chronic back pain   . Spinal stenosis     lumbar  . Leg pain, diffuse   . Cataract   . Tremor     on propranolol  . Wears glasses   . Hearing impaired   . Mild cognitive impairment     sees [facility]    Objective: BP 120/60 mmHg  Pulse 40  Temp(Src) 98.5 F (36.9 C) (Oral)  Wt 224 lb (101.606 kg)  General appearance: alert, no distress, WD/WN, white male Skin warm , dry Oral cavity: MMM, tongue normal, teeth in good  repair Neck: supple, no lymphadenopathy, no thyromegaly, no masses, normal ROM, no bruits Chest: non tender, normal shape and expansion Heart: RRR, normal S1, S2, no murmurs Lungs: left mid lung fields with few rales, but otherwise relatively clear, no wheezing or rhonchi Abdomen: +bs, soft, non tender, non distended, no masses, no hepatomegaly, no splenomegaly, no bruits Back: non tender, normal ROM, no scoliosis Musculoskeletal: upper extremities non tender, no obvious deformity, normal ROM throughout, lower extremities non tender, no obvious deformity, normal ROM throughout Extremities: slight ankle edema, nonpitting, otherwise no edema, no cyanosis, no clubbing Pulses: 1+ symmetric, upper and lower extremities, normal cap refill   Assessment: Encounter Diagnoses  Name Primary?  . Pulmonary embolism Yes  . DVT, bilateral lower limbs   . Other fatigue   . SOB (shortness of breath)   . Bradycardia   . Tremor   . Diabetes type 2, controlled    Plan: Discussed his concerns, wife's concerns.  Discussed the magnitude of his PE, DVTs, age, multiple comorbidity and that it will take time for the clot to resolve, and that his recovery will be a long course.  I think their expectations of a speedier recovery is too optimistic, and overall there is still a good chance of mortality from this.  To  help better their questions, I will coordinate with his specialists to see if they have any other suggestions.  We were not able to find a desaturation with walking around the office.  He only went form 98% room air down to 95% but we also didn't want to over exert him either.  I reviewed recent hematology notes and will request the 02/2015 visit with Dr. Wynonia Lawman and updated echocardiogram results.  F/u pending call back.

## 2015-03-23 ENCOUNTER — Telehealth: Payer: Self-pay | Admitting: Medical

## 2015-03-23 NOTE — Telephone Encounter (Signed)
pls get me Dr. Thurman Coyer notes and echo ASAP Friday so I can review and decide on next steps

## 2015-03-24 NOTE — Telephone Encounter (Signed)
I called and notes and echo is being fax over

## 2015-03-26 ENCOUNTER — Other Ambulatory Visit: Payer: Self-pay | Admitting: Neurology

## 2015-03-26 NOTE — Telephone Encounter (Signed)
No showed last appt  

## 2015-03-28 ENCOUNTER — Other Ambulatory Visit: Payer: Self-pay | Admitting: Neurology

## 2015-03-28 NOTE — Telephone Encounter (Signed)
Patient has appt scheduled on 08/30

## 2015-03-29 ENCOUNTER — Telehealth: Payer: Self-pay | Admitting: Medical

## 2015-03-29 NOTE — Telephone Encounter (Signed)
Of note, I have reviewed Dr. Thurman Coyer recent notes, and recommend he stop Propranolol completely.  I have sent a message to pulmonology and oncology about suggestions to help with SOB, fatigue and DOR given his symptoms.

## 2015-03-29 NOTE — Telephone Encounter (Signed)
Pt dropped off MOST form to be signed by [name]. Completed and copy of form mailed to pt per pt request.

## 2015-03-30 NOTE — Telephone Encounter (Signed)
Christian Frames, do you have pink copies to mail to patient? And she knows about Propanolol.

## 2015-04-03 NOTE — Telephone Encounter (Signed)
[name] mailed the forms to the patient and wife is aware of the medication change

## 2015-04-04 ENCOUNTER — Encounter: Payer: Self-pay | Admitting: Medical

## 2015-04-08 ENCOUNTER — Encounter (HOSPITAL_COMMUNITY): Payer: Self-pay | Admitting: Hematology & Oncology

## 2015-04-10 ENCOUNTER — Telehealth: Payer: Self-pay | Admitting: Medical

## 2015-04-10 NOTE — Telephone Encounter (Signed)
Please set up overnight oxymetry if we haven't done this.   I thought I mentioned it after last visit but don't see order or results.  Per my correspondence with pulmonology, he needs to go ahead and make a f/u with Dr. Halford Chessman with pulmonology but make sure they have copies of Dr. Thurman Coyer most recent note and updated echocardiogram.

## 2015-04-12 ENCOUNTER — Telehealth: Payer: Self-pay | Admitting: Medical

## 2015-04-12 NOTE — Telephone Encounter (Signed)
pls call and see if breathing or energy any better.  After speaking with hematology and pulmonology, pulmonology recommended recheck there, and if no impairment or worse, Dr. Whitney Muse suggested repeat CT of chest to see if clot has improved at all.

## 2015-04-12 NOTE — Telephone Encounter (Signed)
I fax over referral for the patient to get overnight oximetry to [facility] and they will contact the patient by phone.

## 2015-04-12 NOTE — Telephone Encounter (Signed)
I spoke with the wife and she states she check Jr pulse with her home pulse machine and it was in the low 60's. She said energy level is better. She states he is going outside and doing little things and then sit for a while. She said they have an appointment with Dr. Whitney Muse on 04/14/15 @ 120 pm.

## 2015-04-12 NOTE — Telephone Encounter (Signed)
Glad to hear there is some improvement. I spoke to Dr. Whitney Muse personally yesterday, she seems very nice to work with.

## 2015-04-14 ENCOUNTER — Encounter (HOSPITAL_COMMUNITY): Payer: Self-pay | Admitting: Hematology & Oncology

## 2015-04-14 ENCOUNTER — Encounter (HOSPITAL_COMMUNITY): Payer: Medicare Other | Attending: Hematology & Oncology | Admitting: Hematology & Oncology

## 2015-04-14 VITALS — BP 96/64 | HR 74 | Temp 98.3°F | Resp 18 | Wt 220.0 lb

## 2015-04-14 DIAGNOSIS — I82403 Acute embolism and thrombosis of unspecified deep veins of lower extremity, bilateral: Secondary | ICD-10-CM | POA: Diagnosis not present

## 2015-04-14 DIAGNOSIS — I2699 Other pulmonary embolism without acute cor pulmonale: Secondary | ICD-10-CM | POA: Diagnosis not present

## 2015-04-14 NOTE — Patient Instructions (Signed)
[place] at [facility] Discharge Instructions  RECOMMENDATIONS MADE BY THE CONSULTANT AND ANY TEST RESULTS WILL BE SENT TO YOUR REFERRING PHYSICIAN.  Exam and discussion by Dr. Whitney Muse. Will get Ultrasound of your lower legs Refer you to Cardiopulmonary for [facility] appointment  Follow-up in 2 Months.  Thank you for choosing [place] at [facility] to provide your oncology and hematology care.  To afford each patient quality time with our provider, please arrive at least 15 minutes before your scheduled appointment time.    You need to re-schedule your appointment should you arrive 10 or more minutes late.  We strive to give you quality time with our providers, and arriving late affects you and other patients whose appointments are after yours.  Also, if you no show three or more times for appointments you may be dismissed from the clinic at the providers discretion.     Again, thank you for choosing [facility].  Our hope is that these requests will decrease the amount of time that you wait before being seen by our physicians.       _____________________________________________________________  Should you have questions after your visit to [facility], please contact our office at [phone] between the hours of 8:30 a.m. and 4:30 p.m.  Voicemails left after 4:30 p.m. will not be returned until the following business day.  For prescription refill requests, have your pharmacy contact our office.
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
## 2015-04-17 ENCOUNTER — Telehealth: Payer: Self-pay | Admitting: Family Medicine

## 2015-04-17 ENCOUNTER — Telehealth: Payer: Self-pay | Admitting: Neurology

## 2015-04-17 NOTE — Telephone Encounter (Signed)
I left a message on Diane's answer machine in reference to this patient. [phone] TRrying to see if we could get the patient seen in early than 06/28/2015.

## 2015-04-17 NOTE — Telephone Encounter (Signed)
Check on this

## 2015-04-17 NOTE — Telephone Encounter (Signed)
Dr. Whitney Muse nurse called from [facility] oncology and states that the patient was seen there on Friday 04/14/15 and the doctor notice that he has increase in trimmer's. They tried to call over to [facility] neurology to see if they could move his appointment up and the office states that we have to call. Can Please review her OV notes with the patient and let me know if this okay to do

## 2015-04-17 NOTE — Telephone Encounter (Signed)
Sharyn Lull,  It looks like this patient is Dr Rhea Belton patient, not Dr Clydene Fake. Can you check on this for me? If so, he needs to FU with Dr Krista Blue. Let me know if he IS pt of [facility] and I'll try to reschedule.  Thank you, Verneita Griffes

## 2015-04-17 NOTE — Telephone Encounter (Signed)
I checked his appointment and it was patient requested to change physicians.  I called him and he said he requested it because his wife is already an established patient with Dr. [name] and in the future they would like their appointments back to back (they drive here from [place]).  However, for his current problem of worsening tremors, he needs to be seen as soon as possible.  He ask if you could call back tomorrow after 4pm and speak with his wife, [name], about moving his appointment to an earlier date.  Thank you.

## 2015-04-17 NOTE — Telephone Encounter (Signed)
Pearson Grippe from [facility] called and LM on my VM that pt has appt with Dr [name] 07/25/15 at 2:45. Pt needs appt sooner due to increase in tremors and unable to take high dose of beta blockers to help with tremors please call Thanks dg

## 2015-04-18 ENCOUNTER — Telehealth: Payer: Self-pay | Admitting: Neurology

## 2015-04-18 ENCOUNTER — Ambulatory Visit (HOSPITAL_COMMUNITY)
Admission: RE | Admit: 2015-04-18 | Discharge: 2015-04-18 | Disposition: A | Discharge status: Home / Self Care | Payer: Medicare Other | Source: Ambulatory Visit | Attending: Hematology & Oncology | Admitting: Hematology & Oncology

## 2015-04-18 DIAGNOSIS — Z86718 Personal history of other venous thrombosis and embolism: Secondary | ICD-10-CM | POA: Insufficient documentation

## 2015-04-18 DIAGNOSIS — Z86711 Personal history of pulmonary embolism: Secondary | ICD-10-CM | POA: Insufficient documentation

## 2015-04-18 DIAGNOSIS — I82403 Acute embolism and thrombosis of unspecified deep veins of lower extremity, bilateral: Secondary | ICD-10-CM

## 2015-04-18 DIAGNOSIS — R600 Localized edema: Secondary | ICD-10-CM | POA: Insufficient documentation

## 2015-04-18 NOTE — Telephone Encounter (Signed)
Patient's appointment was moved up to 04/20/15 at [facility]

## 2015-04-18 NOTE — Telephone Encounter (Signed)
The patients wife was told that someone was going to call her after 4:00 pm this afternoon about a sooner appointment and she said that she wanted a back to back appointment sense she see's Dr. [name] as well.  I called her to remind her about her husband appointment this Thursday and she said she was not aware of this appointment because someone was going to call her back after 4 today to tell her about an appointment. The best number to contact her is  [phone]

## 2015-04-18 NOTE — Telephone Encounter (Signed)
Wife returned call; verified with her that she wants pt seen this week, Thurs. Also confirmed with her that his Aug appt has not been cancelled and is attached to her FU with Dr [name] as she had requested. She questioned if her husband needs to keep Aug appt. Informed her that this RN will not cancel that FU unless Dr [name] states pt does not need to be seen again in 3 months. She verbalized understanding.

## 2015-04-18 NOTE — Telephone Encounter (Signed)
Left vm requesting call back to give patient earlier FU on Thurs,  04/20/15.

## 2015-04-19 ENCOUNTER — Other Ambulatory Visit: Payer: Self-pay | Admitting: Medical

## 2015-04-19 ENCOUNTER — Telehealth: Payer: Self-pay | Admitting: Medical

## 2015-04-19 NOTE — Telephone Encounter (Signed)
Pharmacy sent fax requesting authorization for refill on TRAMADOL HCL 50mg  tablets ; Quanity 120 tabs. Send to [facility] # [phone]

## 2015-04-19 NOTE — Telephone Encounter (Signed)
First of all, verify what condition is he taking Ultram for?  How often is he taking this?  If taking 4 times daily, I can't prescribe it for every day QID use.   Thus, I can't give him #120 Ultram assuming QID dosing.   This is a controlled substance, and we don't normally prescribe chronic narcotic or similar chronic pain medications  He will need to come in and discuss.

## 2015-04-19 NOTE — Telephone Encounter (Signed)
See message below about Tramadol refill

## 2015-04-20 ENCOUNTER — Encounter: Payer: Self-pay | Admitting: Neurology

## 2015-04-20 ENCOUNTER — Ambulatory Visit (INDEPENDENT_AMBULATORY_CARE_PROVIDER_SITE_OTHER): Payer: Medicare Other | Admitting: Neurology

## 2015-04-20 ENCOUNTER — Other Ambulatory Visit: Payer: Self-pay | Admitting: Medical

## 2015-04-20 VITALS — BP 127/75 | HR 76 | Wt 218.0 lb

## 2015-04-20 DIAGNOSIS — R413 Other amnesia: Secondary | ICD-10-CM

## 2015-04-20 MED ORDER — TRAMADOL HCL 50 MG PO TABS: 50.0000 mg | ORAL_TABLET | Freq: Three times a day (TID) | ORAL | Status: DC | PRN

## 2015-04-20 MED ORDER — DONEPEZIL HCL 10 MG PO TABS: 10.0000 mg | ORAL_TABLET | Freq: Every day | ORAL | Status: DC

## 2015-04-20 NOTE — Telephone Encounter (Signed)
Called pt and asked how he takes tramadol. Pt takes tramadol for all over pain. He may take 1 tablet a week if he is in pain or if hes hurting a lot  He may take 3 tablets a day. He only wants a month supply. The #120 was a 3 month supply as the previous doctor wrote it as he was going out of town and they needed that but they do not need that many now. He takes it as needed. Does pt still need to come in to get this filled

## 2015-04-20 NOTE — Telephone Encounter (Signed)
Called in med to pharmacy  

## 2015-04-20 NOTE — Telephone Encounter (Signed)
rx ready 

## 2015-04-20 NOTE — Patient Instructions (Signed)
I had a long discussion with the patient and his wife regarding his mild memory loss and cognitive impairment. I recommend increasing Aricept 10 mg daily. I have discussed possible side effects with the patient and wife and advised him to call me if needed. Have also encouraged him to increase participation in cognitively challenging exercises like playing bridge, crossword puzzles, sudoku. Continue low-dose Inderal for his tremors. Return for follow-up in 2 months with Cecille Rubin, nurse practitioner or call earlier if necessary. Memory Compensation Strategies  1. Use "WARM" strategy.  W= write it down  A= associate it  R= repeat it  M= make a mental note  2.   You can keep a Social worker.  Use a 3-ring notebook with sections for the following: calendar, important names and phone numbers,  medications, doctors' names/phone numbers, lists/reminders, and a section to journal what you did  each day.   3.    Use a calendar to write appointments down.  4.    Write yourself a schedule for the day.  This can be placed on the calendar or in a separate section of the Memory Notebook.  Keeping a  regular schedule can help memory.  5.    Use medication organizer with sections for each day or morning/evening pills.  You may need help loading it  6.    Keep a basket, or pegboard by the door.  Place items that you need to take out with you in the basket or on the pegboard.  You may also want to  include a message board for reminders.  7.    Use sticky notes.  Place sticky notes with reminders in a place where the task is performed.  For example: " turn off the  stove" placed by the stove, "lock the door" placed on the door at eye level, " take your medications" on  the bathroom mirror or by the place where you normally take your medications.  8.    Use alarms/timers.  Use while cooking to remind yourself to check on food or as a reminder to take your medicine, or as a  reminder to make a call, or  as a reminder to perform another task, etc.
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
## 2015-04-27 ENCOUNTER — Other Ambulatory Visit (HOSPITAL_COMMUNITY): Payer: Self-pay

## 2015-04-27 DIAGNOSIS — Z86718 Personal history of other venous thrombosis and embolism: Secondary | ICD-10-CM

## 2015-04-27 DIAGNOSIS — R609 Edema, unspecified: Secondary | ICD-10-CM

## 2015-04-27 DIAGNOSIS — M79606 Pain in leg, unspecified: Secondary | ICD-10-CM

## 2015-05-09 ENCOUNTER — Ambulatory Visit: Payer: Medicare Other | Admitting: Physical Therapy

## 2015-05-22 ENCOUNTER — Ambulatory Visit (HOSPITAL_COMMUNITY): Payer: Medicare Other | Attending: Hematology & Oncology | Admitting: Physical Therapy

## 2015-05-22 DIAGNOSIS — I82403 Acute embolism and thrombosis of unspecified deep veins of lower extremity, bilateral: Secondary | ICD-10-CM | POA: Diagnosis not present

## 2015-05-22 DIAGNOSIS — M6289 Other specified disorders of muscle: Secondary | ICD-10-CM | POA: Diagnosis not present

## 2015-05-22 DIAGNOSIS — R29898 Other symptoms and signs involving the musculoskeletal system: Secondary | ICD-10-CM | POA: Diagnosis not present

## 2015-05-22 DIAGNOSIS — R2681 Unsteadiness on feet: Secondary | ICD-10-CM | POA: Insufficient documentation

## 2015-05-22 DIAGNOSIS — R6889 Other general symptoms and signs: Secondary | ICD-10-CM | POA: Diagnosis not present

## 2015-05-22 DIAGNOSIS — M6281 Muscle weakness (generalized): Secondary | ICD-10-CM

## 2015-05-22 DIAGNOSIS — M25652 Stiffness of left hip, not elsewhere classified: Secondary | ICD-10-CM

## 2015-05-22 DIAGNOSIS — M25651 Stiffness of right hip, not elsewhere classified: Secondary | ICD-10-CM | POA: Insufficient documentation

## 2015-05-22 NOTE — Therapy (Signed)
[place], [place], [address] Phone: [phone]   Fax:  [phone]  Physical Therapy Evaluation  Patient Details  Name: Christian [name] MRN: [id number] Date of Birth: September 26, 1941 Referring Provider:  Patrici Ranks, MD  Encounter Date: 05/22/2015      PT End of Session - 05/22/15 1202    Visit Number 1   Number of Visits 16   Date for PT Re-Evaluation 06/19/15   Authorization Type Medicare    Authorization Time Period 05/22/15 to 07/22/15   Authorization - Visit Number 1   Authorization - Number of Visits 10   PT Start Time 1105   PT Stop Time 1146   PT Time Calculation (min) 41 min   Activity Tolerance Patient tolerated treatment well;Patient limited by fatigue   Behavior During Therapy [facility] for tasks assessed/performed      Past Medical History  Diagnosis Date  . High cholesterol   . Hypertension   . OSA on CPAP   . Insomnia   . Pulmonary embolism 01/2015  . DVT (deep venous thrombosis) 01/2015  . Diabetes     type 2  . Chronic back pain   . Spinal stenosis     lumbar  . Leg pain, diffuse   . Cataract   . Tremor     on propranolol  . Wears glasses   . Hearing impaired   . Mild cognitive impairment     sees [facility]rology  . Memory loss   . Insomnia   . Numbness     fingers, feet, toes  . Leg swelling   . Confusion   . Agitation     Past Surgical History  Procedure Laterality Date  . Tonsillectomy    . Cataract extraction    . Colonoscopy  2014    There were no vitals filed for this visit.  Visit Diagnosis:  DVT, bilateral lower limbs - Plan: PT plan of care cert/re-cert  Decreased functional activity tolerance - Plan: PT plan of care cert/re-cert  Unsteadiness - Plan: PT plan of care cert/re-cert  Hip stiffness, left - Plan: PT plan of care cert/re-cert  Hip stiffness, right - Plan: PT plan of care cert/re-cert  Leg weakness, bilateral - Plan: PT plan of care  cert/re-cert  Proximal muscle weakness - Plan: PT plan of care cert/re-cert      Subjective Assessment - 05/22/15 1108    Subjective On an average day, has low energy and does have back and leg pain on a daily basis. Patient is on Xarelto for clots.    Pertinent History Patient was diagnosed with blood clots in bialteral LEs and in lungs (Saddle clot) in March; was in the hospital for 3-4 days. Patient did not receive rehab immediately after getting out of the hospital. Patient states things have been very up and down recently.    Patient Stated Goals increase energy and quality of life.    Currently in Pain? Yes   Pain Score 8    Pain Location Neck  also has pain in back and legs             [facility] PT Assessment - 05/22/15 0001    Assessment   Medical Diagnosis bilateral DVTs   Onset Date/Surgical Date 01/31/15   Next MD Visit [place] scheduled but cannot remember date    Precautions   Precautions Other (comment)  on blood thinner (Xarelto)   Precaution Comments blood thinner  Restrictions   Weight Bearing Restrictions No   Balance Screen   Has the patient fallen in the past 6 months No   Has the patient had a decrease in activity level because of a fear of falling?  Yes   Is the patient reluctant to leave their home because of a fear of falling?  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, Community education officer    Observation/Other Assessments   Observations 3 minute walk 398ft/0.62 meters per second    Posture/Postural Control   Posture Comments reduced spinal curves, flexed at hips    AROM   Right Hip External Rotation  --  full range    Right Hip Internal Rotation  15   Left Hip External Rotation  --  full range    Left Hip Internal Rotation  19   Right Ankle Dorsiflexion 14   Left Ankle Dorsiflexion 14    Strength   Right Hip Flexion 3/5   Right Hip Extension --  patient unable to tolerate prone    Right Hip ABduction 2/5   Left Hip Flexion 3/5   Left Hip Extension --  patient unable to tolerate prone    Left Hip ABduction 2/5   Right Knee Flexion 4-/5   Right Knee Extension 4/5   Left Knee Flexion 4-/5   Left Knee Extension 4/5   Right Ankle Dorsiflexion 5/5   Left Ankle Dorsiflexion 5/5   Ambulation/Gait   Gait Comments reduced gait speed, reduced rotation of trunk and pelvis, reduced arm swing, B hip ER, flexed at hips                            PT Education - 05/22/15 1202    Education provided Yes   Education Details prognosis, plan of care moving forward, HEP    Person(s) Educated Patient   Methods Explanation;Handout;Demonstration   Comprehension Verbalized understanding;Returned demonstration;Need further instruction          PT Short Term Goals - 05/22/15 1209    PT SHORT TERM GOAL #1   Title Patient will demonstrate 5/5 strength in bilateral lower extremities and at least 4/5 strength in proximal musculature    Time 4   Period Weeks   Status New   PT SHORT TERM GOAL #2   Title Patient to increase bilateral hip IR to at least 35 degrees and bilateral ankle dorsiflexion range to 20 degrees    Time 4   Period Weeks   Status New   PT SHORT TERM GOAL #3   Title Patient will demonstrate improved gait mechanics as evidenced by improved posture during gait, improved trunk and pelvic rotation, and improved gait speed on a consistent basis    Time 4   Period Weeks   Status New   PT SHORT TERM GOAL #4   Title Patient will be independent in correctly and consistently performing appropriate HEP, to be advanced PRN    Time 4   Period Weeks   Status New           PT Long Term Goals - 05/22/15 1211    PT LONG TERM GOAL #1   Title Patient will be able to ambulate at a gait speed of at least 0.81m/s as evidenced by an ability to ambulate at least  1061ft  during 6 minute walk (or 559ft during 3 minute walk)   Time 8   Period Weeks   Status New   PT LONG TERM GOAL #2   Title Patient will be able to complete Berg balance test with a score of at least 50 to demonstrate reduced overall fall risk    Time 8   Period Weeks   Status New   PT LONG TERM GOAL #3   Title Patient to report that he is walking or using exercise bike at home for at least 15 minutes twice a day with minimal fatigue    Time 8   Period Weeks   Status New   PT LONG TERM GOAL #4   Title Patient to be able to tolerate full 45 minute treatment session of various activities with no rest breaks and minimal fatigue    Time 8   Period Weeks   Status New               Plan - 05/23/2015 1204    Clinical Impression Statement Patient presents status-post diagnosis and treatment of B LE and saddle DVTs with gait and postural impairments, poor functional activity tolerance, decreased balance, stiffness in bilateral hips and ankles, weakness in bilateral LEs and proximal musculature, pain, and reduced functional task performance skills. The patient is currently being treated with Xarelto for his clots.  He also states that he was very active in the past however this illness has really taken a lot out of him and that he really only walks around in the house, is not very active at the moment. Patient will benefit from skilled PT services to address his impairments and assist him in reaching an optimal level of function.    Pt will benefit from skilled therapeutic intervention in order to improve on the following deficits Abnormal gait;Decreased endurance;Hypomobility;Decreased activity tolerance;Decreased strength;Pain;Decreased balance;Decreased mobility;Difficulty walking;Improper body mechanics;Decreased coordination;Decreased safety awareness;Postural dysfunction   Rehab Potential Good   PT Frequency 2x / week   PT Duration 8 weeks   PT Treatment/Interventions ADLs/Self Care  Home Management;Gait training;Stair training;Functional mobility training;Therapeutic activities;Therapeutic exercise;Balance training;Neuromuscular re-education;Patient/family education;Manual techniques;Energy conservation   PT Next Visit Plan review HEP and goals; functional stretching and strengthening; balance work; Nustep    PT Home Exercise Plan given    Consulted and Agree with Plan of Care Patient          G-Codes - 05-23-2015 1215    Functional Assessment Tool Used Based on skilled clinical assessment of functional activity tolerance, posture, strength, balance    Functional Limitation Mobility: Walking and moving around   Mobility: Walking and Moving Around Current Status ([id number]) At least 60 percent but less than 80 percent impaired, limited or restricted   Mobility: Walking and Moving Around Goal Status [phone]) At least 40 percent but less than 60 percent impaired, limited or restricted       Problem List Patient Active Problem List   Diagnosis Date Noted  . Diabetes type 2, uncontrolled 03/06/2015  . OSA on CPAP 03/06/2015  . Edema 03/06/2015  . Leg pain, diffuse 03/06/2015  . Bilateral low back pain without sciatica 03/06/2015  . Tremor 03/06/2015  . Insomnia 03/06/2015  . Vaccine counseling 03/06/2015  . Chronic back pain 03/06/2015  . Wears glasses 03/06/2015  . Hearing loss 03/06/2015  . Spinal stenosis of lumbar region 03/06/2015  . Ptosis 03/06/2015  . Pulmonary emboli 03/06/2015  . Positive depression screening 03/06/2015  . Advance directive discussed with patient  03/06/2015  . Bradycardia 02/27/2015  . History of deep vein thrombosis of lower extremity   . Long-term (current) use of anticoagulants   . Dementia   . DVT (deep venous thrombosis) 02/08/2015  . Essential hypertension 02/08/2015  . Hyperlipidemia 02/08/2015  . SOB (shortness of breath) 02/08/2015  . OSA (obstructive sleep apnea) 02/08/2015  . Mild cognitive impairment 02/08/2015  . PE  (pulmonary embolism) 01/31/2015    Deniece Ree PT, DPT [place] [address] [place], [place], [address] Phone: [phone]   Fax:  [phone]

## 2015-05-22 NOTE — Patient Instructions (Signed)
     HIP ABDUCTION - STANDING   While standing, raise your leg out to the side. Keep your knee straight and maintain your toes pointed forward the entire time.    Use your arms for support for balance and safety. Repeat 10 times each leg, twice a day.    Hip extension  While standing in front of a counter top or something sturdy, extend leg behind you. You should squeeze your glut and keep trunk straight.  Do not kick your leg back a long way, just a small motion. Repeat 10 times each leg, twice a day.   Functional Quadriceps: Sit to Stand   Sit on edge of chair, feet flat on floor. Stand upright, extending knees fully. Repeat _10___ times per set. Do _1___ sets per session. Do __2__ sessions per day.  http://orth.exer.us/734   Copyright  VHI. All rights reserved.    WALKING  Try to walk short distances twice a day, or a longer distance one to two times a day. Since it is summer, you may find that it is easier to go to the local mall where it is air-conditioned. Walk as tolerated.

## 2015-05-24 ENCOUNTER — Other Ambulatory Visit (HOSPITAL_COMMUNITY): Payer: Self-pay | Admitting: *Deleted

## 2015-05-24 DIAGNOSIS — R0602 Shortness of breath: Secondary | ICD-10-CM

## 2015-05-26 ENCOUNTER — Telehealth (HOSPITAL_COMMUNITY): Payer: Self-pay | Admitting: *Deleted

## 2015-05-26 NOTE — Telephone Encounter (Signed)
Call from Cardiology and Dr. Harl Bowie does not feel comfortable beginning his CP rehab with the presence of PE as well as an echo which reports RV strain. He suggests that we repeat echo and if RV strain is resolved they will fell better about engaging him in exercise.

## 2015-05-30 ENCOUNTER — Other Ambulatory Visit: Payer: Self-pay | Admitting: Medical

## 2015-05-30 NOTE — Telephone Encounter (Signed)
Is this okay to refill? 

## 2015-05-31 ENCOUNTER — Ambulatory Visit (HOSPITAL_COMMUNITY): Admission: RE | Admit: 2015-05-31 | Payer: Medicare Other | Source: Ambulatory Visit

## 2015-06-02 ENCOUNTER — Other Ambulatory Visit (HOSPITAL_COMMUNITY): Payer: Self-pay | Admitting: Oncology

## 2015-06-02 DIAGNOSIS — I2699 Other pulmonary embolism without acute cor pulmonale: Secondary | ICD-10-CM

## 2015-06-03 ENCOUNTER — Other Ambulatory Visit: Payer: Self-pay | Admitting: Medical

## 2015-06-06 ENCOUNTER — Ambulatory Visit (HOSPITAL_COMMUNITY)
Admission: RE | Admit: 2015-06-06 | Discharge: 2015-06-06 | Disposition: A | Discharge status: Home / Self Care | Payer: Medicare Other | Source: Ambulatory Visit | Attending: Oncology | Admitting: Oncology

## 2015-06-06 DIAGNOSIS — I10 Essential (primary) hypertension: Secondary | ICD-10-CM

## 2015-06-06 DIAGNOSIS — I2699 Other pulmonary embolism without acute cor pulmonale: Secondary | ICD-10-CM | POA: Insufficient documentation

## 2015-06-07 ENCOUNTER — Ambulatory Visit (HOSPITAL_COMMUNITY): Payer: Medicare Other | Attending: Hematology & Oncology | Admitting: Physical Therapy

## 2015-06-07 ENCOUNTER — Telehealth: Payer: Self-pay | Admitting: Internal Medicine

## 2015-06-07 ENCOUNTER — Other Ambulatory Visit: Payer: Self-pay | Admitting: Medical

## 2015-06-07 ENCOUNTER — Telehealth: Payer: Self-pay | Admitting: Cardiology

## 2015-06-07 MED ORDER — TRAMADOL HCL 50 MG PO TABS: 50.0000 mg | ORAL_TABLET | Freq: Three times a day (TID) | ORAL | Status: DC | PRN

## 2015-06-07 NOTE — Telephone Encounter (Signed)
Pt would like to know the results of his Echo

## 2015-06-07 NOTE — Telephone Encounter (Signed)
Refill request for tramadol 50mg  to [facility]

## 2015-06-07 NOTE — Telephone Encounter (Signed)
Pt wife called back and was notified rx was ready and to schedule an appt but they are waiting until they get echo results back from where he had it done

## 2015-06-07 NOTE — Telephone Encounter (Signed)
To Zandra Abts MD

## 2015-06-07 NOTE — Telephone Encounter (Signed)
Ultram ready for pickup (can't be called/faxed), and due for 19mo f/u on medications, pain

## 2015-06-08 ENCOUNTER — Other Ambulatory Visit: Payer: Self-pay | Admitting: Medical

## 2015-06-08 NOTE — Telephone Encounter (Signed)
Echo looks good, the right side of his heart that was affected by the blood clot has gone back to normal. He did have several abnormal heart beats during the study (PVCs), I had sent a message to set up a new patient appointment with me, please make sure this is arranged. We would want to look into this before starting rehab as well.   Zandra Abts MD

## 2015-06-08 NOTE — Telephone Encounter (Signed)
Is this okay to refill? 

## 2015-06-08 NOTE — Telephone Encounter (Signed)
Patient and wife notified of results. All questions answered. Both voiced understanding.

## 2015-06-09 ENCOUNTER — Telehealth (HOSPITAL_COMMUNITY): Payer: Self-pay

## 2015-06-09 ENCOUNTER — Ambulatory Visit (HOSPITAL_COMMUNITY): Payer: Medicare Other

## 2015-06-12 ENCOUNTER — Other Ambulatory Visit: Payer: Self-pay | Admitting: Medical

## 2015-06-13 ENCOUNTER — Encounter (HOSPITAL_COMMUNITY): Payer: Medicare Other | Admitting: Physical Therapy

## 2015-06-14 ENCOUNTER — Encounter (HOSPITAL_COMMUNITY): Payer: Medicare Other | Attending: Hematology & Oncology | Admitting: Oncology

## 2015-06-14 ENCOUNTER — Encounter (HOSPITAL_COMMUNITY): Payer: Self-pay | Admitting: Oncology

## 2015-06-14 VITALS — BP 132/59 | HR 38 | Temp 98.0°F | Resp 16 | Wt 218.2 lb

## 2015-06-14 DIAGNOSIS — D6852 Prothrombin gene mutation: Secondary | ICD-10-CM

## 2015-06-14 DIAGNOSIS — I82403 Acute embolism and thrombosis of unspecified deep veins of lower extremity, bilateral: Secondary | ICD-10-CM | POA: Diagnosis not present

## 2015-06-14 DIAGNOSIS — I2699 Other pulmonary embolism without acute cor pulmonale: Secondary | ICD-10-CM | POA: Diagnosis not present

## 2015-06-14 HISTORY — DX: Prothrombin gene mutation: D68.52

## 2015-06-14 NOTE — Patient Instructions (Signed)
..  [place] at [facility] Discharge Instructions  RECOMMENDATIONS MADE BY THE CONSULTANT AND ANY TEST RESULTS WILL BE SENT TO YOUR REFERRING PHYSICIAN.  Seen and discussion with Robynn Pane PA-C. Call the cancer center with any questions and/or concerns that you have.   Labs in 3 months. Follow up appt. In 3 months.      Thank you for choosing [place] [facility] to provide your oncology and hematology care.  To afford each patient quality time with our provider, please arrive at least 15 minutes before your scheduled appointment time.    You need to re-schedule your appointment should you arrive 10 or more minutes late.  We strive to give you quality time with our providers, and arriving late affects you and other patients whose appointments are after yours.  Also, if you no show three or more times for appointments you may be dismissed from the clinic at the providers discretion.     Again, thank you for choosing [facility].  Our hope is that these requests will decrease the amount of time that you wait before being seen by our physicians.       _____________________________________________________________  Should you have questions after your visit to [facility], please contact our office at [phone] between the hours of 8:30 a.m. and 4:30 p.m.  Voicemails left after 4:30 p.m. will not be returned until the following business day.  For prescription refill requests, have your pharmacy contact our office.

## 2015-06-14 NOTE — Progress Notes (Signed)
Christian [name], [place] [address]  Prothrombin G20210A mutation, heterozygous, 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 - Plan: CBC with Differential, Ferritin  CURRENT THERAPY: Lifelong anticoagulation, currently on Xarelto  INTERVAL HISTORY: Christian [name] 74 y.o. male returns for followup of heterozygous G-20210-A-Mutation (Prothrombin gene mutation) in the setting of a pulmonary embolism with bilateral DVT on 01/31/2015 with CTA chest demonstrating large volume PE with evidence of R heart strain.   Echo on 02/01/2015 illustrated an EF of 45- 69%, grade 1 diastolic dysfunction, moderate RV dilation, and PAP at 52 mm Hg.  He was treated with Heparin and transition to Xarelto.  Hypercoag panel demonstrates a heterozygosity for G-20210-A mutation (Prothrombin gene mutation). Resolution of DVTs noted on [place] on 04/18/2015.  2 D echo on 06/06/2015 demonstrates an improvement in LVEF.  I personally reviewed and went over laboratory results with the patient.  The results are noted within this dictation.  I personally reviewed and went over radiographic studies with the patient.  The results are noted within this dictation.  Repeat 2D echo was performed on 7/12 as cardiology declined to enroll the patient in cardiopulmonary rehabilitation without repeat 2D echo.  This exam was performed and improved LVEF was appreciated.  Results faxed to Dr. Harl Bowie (Cardiology).  Repeat LE [place] B/L on 04/18/2015 demonstrate resolution of DVTs.  Chart reviewed and neurology dictation noted.  Risks, benefits, alternatives, and side effects of Xarelto were discussed.  The patient is advised to use common sense in order to avoid major bleeding trauma including, but not limited to, following safety guidelines for power tools, wearing a seatbelt in motor vehicle, following gun safety rules, etc.  A list of complaints were provided that are not hematologically related and I have  deferred them to the appropriate provider: 1. Insomnia- I recommended a few OTC options.  If ineffective, I have encouraged follow-up with primary care provider 2. Decreased memory- he is on Aricept and recently was seen by Neurology.  I will defer to Neurology and primary care provider 3. Fatigue- defer to primary care provider  He denies any SOB, dyspnea, chest pain, blood in stool, dark stool, hematuria, hemoptysis.  Past Medical History  Diagnosis Date  . High cholesterol   . Hypertension   . OSA on CPAP   . Insomnia   . Pulmonary embolism 01/2015  . DVT (deep venous thrombosis) 01/2015  . Diabetes     type 2  . Chronic back pain   . Spinal stenosis     lumbar  . Leg pain, diffuse   . Cataract   . Tremor     on propranolol  . Wears glasses   . Hearing impaired   . Mild cognitive impairment     sees [facility] Neurology  . Memory loss   . Insomnia   . Numbness     fingers, feet, toes  . Leg swelling   . Confusion   . Agitation   . 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 06/14/2015    has PE (pulmonary embolism); Essential hypertension; Hyperlipidemia; SOB (shortness of breath); OSA (obstructive sleep apnea); Mild cognitive impairment; Long-term (current) use of anticoagulants; Dementia; Bradycardia; Diabetes type 2, uncontrolled; OSA on CPAP; Edema; Leg pain, diffuse; Bilateral low back pain without sciatica; Tremor; Insomnia; Vaccine counseling; Chronic back pain; Wears glasses; Hearing loss; Spinal stenosis of lumbar region; Ptosis; Pulmonary emboli; Positive depression screening;  Advance directive discussed with patient; and Prothrombin G20210A mutation, heterozygous, 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 on his problem list.     is allergic to bee venom; oxycodone; and oxycontin.  Current Outpatient Prescriptions on File Prior to Visit  Medication Sig Dispense Refill  . cyclobenzaprine (FLEXERIL) 10 MG tablet TAKE 1  TABLET TWICE DAILY AS NEEDED FOR FOR BACK PAIN. 60 tablet 0  . EPINEPHrine (EPIPEN 2-PAK) 0.3 mg/0.3 mL IJ SOAJ injection Inject 0.3 mLs (0.3 mg total) into the muscle once. 1 Device 0  . INVOKANA 100 MG TABS tablet TAKE 1 TABLET ONCE DAILY. 30 tablet 0  . metFORMIN (GLUCOPHAGE-XR) 500 MG 24 hr tablet Take 1,000 mg by mouth 2 (two) times daily.     . Misc Natural Products (OSTEO BI-FLEX ADV JOINT SHIELD PO) Take by mouth.    . rivaroxaban (XARELTO) 20 MG TABS tablet Take 1 tablet (20 mg total) by mouth daily with supper. 30 tablet 4  . VICTOZA 18 MG/3ML SOPN INJECT 1.8MG  ONCE DAILY AS DIRECTED. 9 mL PRN  . donepezil (ARICEPT) 10 MG tablet Take 1 tablet (10 mg total) by mouth at bedtime. (Patient not taking: Reported on 06/14/2015) 30 tablet 3  . propranolol (INDERAL) 20 MG tablet Take 20 mg by mouth once.     . traMADol (ULTRAM) 50 MG tablet Take 1 tablet (50 mg total) by mouth every 8 (eight) hours as needed. FOR PAIN (Patient not taking: Reported on 06/14/2015) 60 tablet 0   No current facility-administered medications on file prior to visit.    Past Surgical History  Procedure Laterality Date  . Tonsillectomy    . Cataract extraction    . Colonoscopy  2014    Denies any headaches, dizziness, double vision, fevers, chills, night sweats, nausea, vomiting, diarrhea, constipation, chest pain, heart palpitations, shortness of breath, blood in stool, black tarry stool, urinary pain, urinary burning, urinary frequency, hematuria.   PHYSICAL EXAMINATION  ECOG PERFORMANCE STATUS: 1 - Symptomatic but completely ambulatory  Filed Vitals:   06/14/15 1400  BP: 132/59  Pulse: 38  Temp: 98 F (36.7 C)  Resp: 16    GENERAL:alert, no distress, well nourished, well developed, comfortable, cooperative, obese, smiling and accompanied by his wife SKIN: skin color, texture, turgor are normal, no rashes or significant lesions HEAD: Normocephalic, No masses, lesions, tenderness or abnormalities EYES:  normal, PERRLA, EOMI, Conjunctiva are pink and non-injected EARS: External ears normal OROPHARYNX:lips, buccal mucosa, and tongue normal and mucous membranes are moist  NECK: supple, no adenopathy, thyroid normal size, non-tender, without nodularity, no stridor, non-tender, trachea midline LYMPH:  no palpable lymphadenopathy, no hepatosplenomegaly BREAST:not examined LUNGS: clear to auscultation and percussion HEART: irregularly irregular and PVCs noted, HR apically on auscultation is 63 ABDOMEN:abdomen soft, non-tender and normal bowel sounds BACK: Back symmetric, no curvature., No CVA tenderness EXTREMITIES:less then 2 second capillary refill, no joint deformities, effusion, or inflammation, no edema, no skin discoloration, no clubbing, no cyanosis  NEURO: alert & oriented x 3 with fluent speech, no focal motor/sensory deficits, gait normal    LABORATORY DATA: CBC    Component Value Date/Time   WBC 5.3 02/03/2015 0505   RBC 4.46 02/03/2015 0505   HGB 13.4 02/03/2015 0505   HCT 39.6 02/03/2015 0505   PLT 158 02/03/2015 0505   MCV 88.8 02/03/2015 0505   MCH 30.0 02/03/2015 0505   MCHC 33.8 02/03/2015 0505   RDW 13.1 02/03/2015 0505   LYMPHSABS 0.9 01/31/2015 1443  MONOABS 0.7 01/31/2015 1443   EOSABS 0.1 01/31/2015 1443   BASOSABS 0.0 01/31/2015 1443      Chemistry      Component Value Date/Time   NA 138 03/06/2015 0001   K 5.0 03/06/2015 0001   CL 104 03/06/2015 0001   CO2 27 03/06/2015 0001   BUN 18 03/06/2015 0001   CREATININE 0.89 03/06/2015 0001   CREATININE 0.82 02/03/2015 0505      Component Value Date/Time   CALCIUM 9.4 03/06/2015 0001   ALKPHOS 67 03/06/2015 0001   AST 14 03/06/2015 0001   ALT 12 03/06/2015 0001   BILITOT 0.4 03/06/2015 0001        PENDING LABS:   RADIOGRAPHIC STUDIES:  No results found.   PATHOLOGY:    ASSESSMENT AND PLAN:  Prothrombin G20210A mutation, heterozygous, 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 Pulmonary  embolism with bilateral DVT on 01/31/2015 with CTA chest demonstrating large volume PE with evidence of R heart strain.   Echo on 02/01/2015 illustrated an EF of 45- 76%, grade 1 diastolic dysfunction, moderate RV dilation, and PAP at 52 mm Hg.  He was treated with Heparin and transition to Xarelto.  Hypercoag panel demonstrates a heterozygosity for G-20210-A mutation (Prothrombin gene mutation). Resolution of DVTs noted on [place] on 04/18/2015.  2 D echo on 06/06/2015 demonstrates an improvement in LVEF.  Continue anticoagulation with Xarelto.  Risks, benefits, alternatives, and side effects discussed regarding anticoagulation therapy and Xarelto/XA inhibitors.  Follow-up with Dr. Harl Bowie, Cardiology as scheduled for, hopefully, referral into cardiopulmonary rehabilitation if this is thought to be beneficial.  He has been referred to [place] is planned to [place] and I have provided recommendations regarding trip breaks to get out of the automobile and ambulate, increase PO fluids.  Compliance with Xarelto strongly urged.  Brother recently diagnosed with blood clot? Prothrombin Gene mutation?  Labs in 3 months: CBC diff, ferritin  Return in 3 months for follow-up.    THERAPY PLAN:  Continue with lifelong Xarelto.  Compliance encouraged.  All questions were answered. The patient knows to call the clinic with any problems, questions or concerns. We can certainly see the patient much sooner if necessary.  Patient and plan discussed with Dr. Ancil Linsey and she is in agreement with the aforementioned.   This note is electronically signed by: Doy Mince 06/14/2015 3:29 PM

## 2015-06-14 NOTE — Assessment & Plan Note (Addendum)
Pulmonary embolism with bilateral DVT on 01/31/2015 with CTA chest demonstrating large volume PE with evidence of R heart strain.   Echo on 02/01/2015 illustrated an EF of 45- 91%, grade 1 diastolic dysfunction, moderate RV dilation, and PAP at 52 mm Hg.  He was treated with Heparin and transition to Xarelto.  Hypercoag panel demonstrates a heterozygosity for G-20210-A mutation (Prothrombin gene mutation). Resolution of DVTs noted on [place] on 04/18/2015.  2 D echo on 06/06/2015 demonstrates an improvement in LVEF.  Continue anticoagulation with Xarelto.  Risks, benefits, alternatives, and side effects discussed regarding anticoagulation therapy and Xarelto/XA inhibitors.  Follow-up with Dr. Harl Bowie, Cardiology as scheduled for, hopefully, referral into cardiopulmonary rehabilitation if this is thought to be beneficial.  He has been referred to [place] is planned to [place] and I have provided recommendations regarding trip breaks to get out of the automobile and ambulate, increase PO fluids.  Compliance with Xarelto strongly urged.  Brother recently diagnosed with blood clot? Prothrombin Gene mutation?  Labs in 3 months: CBC diff, ferritin  Return in 3 months for follow-up.

## 2015-06-15 ENCOUNTER — Encounter (HOSPITAL_COMMUNITY): Payer: Medicare Other | Admitting: Physical Therapy

## 2015-06-16 ENCOUNTER — Other Ambulatory Visit: Payer: Self-pay | Admitting: Medical

## 2015-06-19 NOTE — Telephone Encounter (Signed)
Is this okay to refill? 

## 2015-06-20 ENCOUNTER — Encounter (HOSPITAL_COMMUNITY): Payer: Medicare Other | Admitting: Physical Therapy

## 2015-06-20 ENCOUNTER — Telehealth: Payer: Self-pay | Admitting: Medical

## 2015-06-20 ENCOUNTER — Encounter: Payer: Self-pay | Admitting: Medical

## 2015-06-20 ENCOUNTER — Ambulatory Visit (INDEPENDENT_AMBULATORY_CARE_PROVIDER_SITE_OTHER): Payer: Medicare Other | Admitting: Medical

## 2015-06-20 VITALS — BP 130/60 | HR 44 | Temp 98.0°F | Resp 14 | Wt 235.0 lb

## 2015-06-20 DIAGNOSIS — M545 Low back pain, unspecified: Secondary | ICD-10-CM | POA: Insufficient documentation

## 2015-06-20 DIAGNOSIS — L84 Corns and callosities: Secondary | ICD-10-CM | POA: Insufficient documentation

## 2015-06-20 DIAGNOSIS — I2699 Other pulmonary embolism without acute cor pulmonale: Secondary | ICD-10-CM | POA: Insufficient documentation

## 2015-06-20 DIAGNOSIS — Z9989 Dependence on other enabling machines and devices: Secondary | ICD-10-CM

## 2015-06-20 DIAGNOSIS — I2782 Chronic pulmonary embolism: Secondary | ICD-10-CM | POA: Insufficient documentation

## 2015-06-20 DIAGNOSIS — M79604 Pain in right leg: Secondary | ICD-10-CM | POA: Diagnosis not present

## 2015-06-20 DIAGNOSIS — G4733 Obstructive sleep apnea (adult) (pediatric): Secondary | ICD-10-CM | POA: Diagnosis not present

## 2015-06-20 DIAGNOSIS — I498 Other specified cardiac arrhythmias: Secondary | ICD-10-CM | POA: Diagnosis not present

## 2015-06-20 DIAGNOSIS — R5383 Other fatigue: Secondary | ICD-10-CM | POA: Diagnosis not present

## 2015-06-20 DIAGNOSIS — I82409 Acute embolism and thrombosis of unspecified deep veins of unspecified lower extremity: Secondary | ICD-10-CM | POA: Insufficient documentation

## 2015-06-20 DIAGNOSIS — E0841 Diabetes mellitus due to underlying condition with diabetic mononeuropathy: Secondary | ICD-10-CM | POA: Insufficient documentation

## 2015-06-20 DIAGNOSIS — L609 Nail disorder, unspecified: Secondary | ICD-10-CM | POA: Diagnosis not present

## 2015-06-20 DIAGNOSIS — E119 Type 2 diabetes mellitus without complications: Secondary | ICD-10-CM

## 2015-06-20 DIAGNOSIS — R001 Bradycardia, unspecified: Secondary | ICD-10-CM | POA: Diagnosis not present

## 2015-06-20 DIAGNOSIS — L602 Onychogryphosis: Secondary | ICD-10-CM | POA: Insufficient documentation

## 2015-06-20 DIAGNOSIS — M2042 Other hammer toe(s) (acquired), left foot: Secondary | ICD-10-CM

## 2015-06-20 DIAGNOSIS — M79605 Pain in left leg: Secondary | ICD-10-CM

## 2015-06-20 DIAGNOSIS — G8929 Other chronic pain: Secondary | ICD-10-CM | POA: Insufficient documentation

## 2015-06-20 LAB — COMPREHENSIVE METABOLIC PANEL
ALBUMIN: 4.1 g/dL (ref 3.6–5.1)
ALK PHOS: 75 U/L (ref 40–115)
ALT: 23 U/L (ref 9–46)
AST: 15 U/L (ref 10–35)
BUN: 20 mg/dL (ref 7–25)
CO2: 25 meq/L (ref 20–31)
CREATININE: 0.8 mg/dL (ref 0.70–1.18)
Calcium: 9.7 mg/dL (ref 8.6–10.3)
Chloride: 103 mEq/L (ref 98–110)
GLUCOSE: 238 mg/dL — AB (ref 65–99)
Potassium: 4.5 mEq/L (ref 3.5–5.3)
Sodium: 138 mEq/L (ref 135–146)
TOTAL PROTEIN: 6.9 g/dL (ref 6.1–8.1)
Total Bilirubin: 0.4 mg/dL (ref 0.2–1.2)

## 2015-06-20 LAB — CBC
HCT: 46.9 % (ref 39.0–52.0)
Hemoglobin: 15.8 g/dL (ref 13.0–17.0)
MCH: 29.3 pg (ref 26.0–34.0)
MCHC: 33.7 g/dL (ref 30.0–36.0)
MCV: 87 fL (ref 78.0–100.0)
MPV: 11 fL (ref 8.6–12.4)
Platelets: 248 10*3/uL (ref 150–400)
RBC: 5.39 MIL/uL (ref 4.22–5.81)
RDW: 13.3 % (ref 11.5–15.5)
WBC: 6.3 10*3/uL (ref 4.0–10.5)

## 2015-06-20 LAB — TSH: TSH: 0.789 u[IU]/mL (ref 0.350–4.500)

## 2015-06-20 LAB — HEMOGLOBIN A1C
Hgb A1c MFr Bld: 8 % — ABNORMAL HIGH (ref <5.7)
Mean Plasma Glucose: 183 mg/dL — ABNORMAL HIGH (ref <117)

## 2015-06-20 MED ORDER — CYCLOBENZAPRINE HCL 10 MG PO TABS: 10.0000 mg | ORAL_TABLET | Freq: Every day | ORAL | Status: DC

## 2015-06-20 MED ORDER — SUVOREXANT 15 MG PO TABS: 1.0000 | ORAL_TABLET | Freq: Every day | ORAL | Status: DC

## 2015-06-20 MED ORDER — RIVAROXABAN 20 MG PO TABS: 20.0000 mg | ORAL_TABLET | Freq: Every day | ORAL | Status: DC

## 2015-06-20 NOTE — Telephone Encounter (Signed)
Refer to podiatry in [place]; RE: hypertrophic toenails, hammer toe, pre-ulcerative callous, diabetic neuropathy  Refer for updated sleep study and overnight oximetry to check therapy of current CPAP

## 2015-06-20 NOTE — Progress Notes (Signed)
Subjective: Here for routine f/u.   Accompanied by wife.     Still c/o no energy, difficult just getting up from a chair.  Had recent echocardiogram, has f/u pending with Dr. Harl Bowie in [place].  Heart rate still running low.    Diabetes - checking glucose, gets under 100 to up to 180 fasting, swings back and forth, but usually <130 fasting.   Eating healthy, compliant with medications, but Victoza coapy is almost $400/month.  Has chronic back pain, ultram helps, uses flexeril periodically, has pains into both legs, chronic.   Has paresthesias of feet and distal legs, no weakness, no falls.    Has trouble getting and staying asleep.  Uses CPAP but not sure how effective this is.    Needs handicap sticker.  Not checking feet daily.   Compliant with Xarelto without c/o  Past Medical History  Diagnosis Date  . High cholesterol   . Hypertension   . OSA on CPAP   . Insomnia   . Pulmonary embolism 01/2015  . DVT (deep venous thrombosis) 01/2015  . Diabetes     type 2  . Chronic back pain   . Spinal stenosis     lumbar  . Leg pain, diffuse   . Cataract   . Tremor     on propranolol  . Wears glasses   . Hearing impaired   . Mild cognitive impairment     sees [facility] Neurology  . Memory loss   . Insomnia   . Numbness     fingers, feet, toes  . Leg swelling   . Confusion   . Agitation   . 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 06/14/2015   ROS as in subjective   Objective: BP 130/60 mmHg  Pulse 44  Temp(Src) 98 F (36.7 C) (Oral)  Resp 14  Wt 235 lb (106.595 kg)  SpO2 98%  General appearance: alert, no distress, WD/WN Oral cavity: MMM, no lesions Neck: supple, no lymphadenopathy, no thyromegaly, no masses Heart: RRR, normal S1, S2, no murmurs Lungs: CTA bilaterally, no wheezes, rhonchi, or rales Abdomen: +bs, soft, non tender, non distended, no masses, no hepatomegaly, no splenomegaly Back: non tender, limited ROM  due to pain Musculoskeletal: nontender, no swelling, no obvious deformity Extremities: no edema, no cyanosis, no clubbing Pulses: 1+ bilat Neurological: see separate foot exam, alert, oriented x 3, CN2-12 intact, strength normal upper extremities and lower extremities, no cerebellar signs, gait normal Psychiatric: normal affect, behavior normal, pleasant  See separate foot exam  Assessment: Encounter Diagnoses  Name Primary?  . Diabetes type 2, controlled Yes  . Bradycardia   . Decreased energy   . Pulmonary embolism   . DVT (deep venous thrombosis), unspecified laterality   . OSA on CPAP   . Hammer toe of left foot   . Pre-ulcerative calluses   . Hypertrophic toenail   . Diabetic mononeuropathy associated with diabetes mellitus due to underlying condition   . Leg pain, bilateral   . Chronic low back pain     Plan: DM type 2 - labs today. Given costs, change from Victoza to [employer] weekly.  C/t rest of medications as usual Bradycardia - reviewed recent echocardiogram from 06/06/15 showing EF 60-65%, mild LVH.  He will make f/u appt with Dr. Harl Bowie, cardiologist in [place] Decreases energy - labs today, f/u with Dr. Harl Bowie, and hopefully he can start cardiac rehab soon.  Overall seems to be improved a little in regards  to PE. PE/DVT - c/t lifelong Xarelto.  Gradual return to activity, avoid being out in the heat OSA on CPAP - referral for updated sleep study and overnight oximetry Hammer toe, callus, hypertrophic toenail, neuropathy, leg pain - referral to podiatry Chronic low back pain - Tramadol prn Insomnia - begin trial of Belsomra.  Discuss risks/beneftis of medication Completed handicap placard request today.

## 2015-06-20 NOTE — Addendum Note (Signed)
Addended by: Carlena Hurl on: 06/20/2015 08:41 PM   Modules accepted: Orders

## 2015-06-21 ENCOUNTER — Other Ambulatory Visit: Payer: Self-pay | Admitting: Medical

## 2015-06-21 MED ORDER — DULAGLUTIDE 1.5 MG/0.5ML ~~LOC~~ SOAJ
1.5000 mg | SUBCUTANEOUS | Status: DC
Start: 2015-06-21 — End: 2015-06-28

## 2015-06-21 MED ORDER — CANAGLIFLOZIN-METFORMIN HCL 150-1000 MG PO TABS: 1.0000 | ORAL_TABLET | Freq: Two times a day (BID) | ORAL | Status: DC

## 2015-06-22 ENCOUNTER — Encounter (HOSPITAL_COMMUNITY): Payer: Medicare Other | Admitting: Physical Therapy

## 2015-06-22 ENCOUNTER — Other Ambulatory Visit: Payer: Self-pay

## 2015-06-22 DIAGNOSIS — G4733 Obstructive sleep apnea (adult) (pediatric): Secondary | ICD-10-CM

## 2015-06-22 NOTE — Telephone Encounter (Signed)
Ordered sleep study through [facility]

## 2015-06-26 NOTE — Telephone Encounter (Signed)
Was going to try to get pt scheduled with [facility] @ [phone] in [place] but they are closed. Will try again

## 2015-06-27 ENCOUNTER — Encounter (HOSPITAL_COMMUNITY): Payer: Medicare Other | Admitting: Physical Therapy

## 2015-06-27 NOTE — Telephone Encounter (Signed)
[facility] in [place] is not taking any new patients.

## 2015-06-28 ENCOUNTER — Ambulatory Visit (INDEPENDENT_AMBULATORY_CARE_PROVIDER_SITE_OTHER): Payer: Medicare Other | Admitting: Cardiology

## 2015-06-28 ENCOUNTER — Ambulatory Visit (HOSPITAL_COMMUNITY)
Admission: RE | Admit: 2015-06-28 | Discharge: 2015-06-28 | Disposition: A | Discharge status: Home / Self Care | Payer: Medicare Other | Source: Ambulatory Visit | Attending: Cardiology | Admitting: Cardiology

## 2015-06-28 ENCOUNTER — Encounter: Payer: Self-pay | Admitting: Cardiology

## 2015-06-28 VITALS — BP 134/60 | HR 54 | Ht 71.0 in | Wt 221.4 lb

## 2015-06-28 DIAGNOSIS — R42 Dizziness and giddiness: Secondary | ICD-10-CM

## 2015-06-28 DIAGNOSIS — I493 Ventricular premature depolarization: Secondary | ICD-10-CM

## 2015-06-28 NOTE — Patient Instructions (Signed)
Your physician recommends that you schedule a follow-up appointment in: TO BE DETERMINED  Your physician has recommended that you wear a holter monitor. Holter monitors are medical devices that record the heart's electrical activity. Doctors most often use these monitors to diagnose arrhythmias. Arrhythmias are problems with the speed or rhythm of the heartbeat. The monitor is a small, portable device. You can wear one while you do your normal daily activities. This is usually used to diagnose what is causing palpitations/syncope (passing out).  Your physician recommends that you continue on your current medications as directed. Please refer to the Current Medication list given to you today.   Thanks for choosing [facility]!!!

## 2015-06-28 NOTE — Telephone Encounter (Signed)
I fax over the patients information to the place below and they will contact the patient to make an appointment. [place], [place]  Website  Directions  4.08 Google reviews   Doctor  Address: [address] [place] [place], [place] [address]  Phone: [phone]  I fax over a referral for the patient to get a home sleep study done: [facility] and they will contact the patient to schedule

## 2015-06-28 NOTE — Progress Notes (Signed)
Patient ID: NICHOLUS CHANDRAN, male   DOB: 20-Apr-1941, 74 y.o.   MRN: [id number]     Clinical Summary Mr. Bill is a 74 y.o.male seen today as a new patient for the following medical problems.  1. PVCs - patient noted to have frequent PVCs during recent echocardiogram - reports occasional palpitations, lasts about 1 minute. No other associated symptoms. Ongoing for several years, no change in frequency - notes chest pain at times. Sharp pain midchest, 3/10. No other associatiated symptoms. Lasts 1-2 minutes. Worst with lifting heavy objects.  - Notes dizziness with first standing. Reports poor oral intake.  - several months of generalized fatigue.    2. PE - 01/2015 CT ches twith large volume PE with evidence of RV strain.  - has been on anticoag since that time. Echo 05/2015 shows resolution of RV strain - anticoag has been managed by his heme/onc physician   Past Medical History  Diagnosis Date  . High cholesterol   . Hypertension   . OSA on CPAP   . Insomnia   . Pulmonary embolism 01/2015  . DVT (deep venous thrombosis) 01/2015  . Diabetes     type 2  . Chronic back pain   . Spinal stenosis     lumbar  . Leg pain, diffuse   . Cataract   . Tremor     on propranolol  . Wears glasses   . Hearing impaired   . Mild cognitive impairment     sees [facility] Neurology  . Memory loss   . Insomnia   . Numbness     fingers, feet, toes  . Leg swelling   . Confusion   . Agitation   . 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 06/14/2015     Allergies  Allergen Reactions  . Bee Venom   . Oxycodone Other (See Comments)    Mental status changes  . Oxycontin [Oxycodone Hcl] Other (See Comments)    Mental status changes per pt     Current Outpatient Prescriptions  Medication Sig Dispense Refill  . Canagliflozin-Metformin HCl (INVOKAMET) [phone] MG TABS Take 1 tablet by mouth 2 (two) times daily. 180 tablet 1  . cyclobenzaprine  (FLEXERIL) 10 MG tablet Take 1 tablet (10 mg total) by mouth at bedtime. 30 tablet 1  . donepezil (ARICEPT) 10 MG tablet Take 1 tablet (10 mg total) by mouth at bedtime. 30 tablet 3  . Dulaglutide (TRULICITY) 1.5 UV/2.5DG SOPN Inject 1.5 mg into the skin once a week. 0.5 mL 2  . EPINEPHrine (EPIPEN 2-PAK) 0.3 mg/0.3 mL IJ SOAJ injection Inject 0.3 mLs (0.3 mg total) into the muscle once. 1 Device 0  . Misc Natural Products (OSTEO BI-FLEX ADV JOINT SHIELD PO) Take by mouth.    . rivaroxaban (XARELTO) 20 MG TABS tablet Take 1 tablet (20 mg total) by mouth daily with supper. 90 tablet 3  . Suvorexant (BELSOMRA) 15 MG TABS Take 1 tablet by mouth at bedtime. 30 tablet 1  . traMADol (ULTRAM) 50 MG tablet Take 1 tablet (50 mg total) by mouth every 8 (eight) hours as needed. FOR PAIN 60 tablet 0   No current facility-administered medications for this visit.     Past Surgical History  Procedure Laterality Date  . Tonsillectomy    . Cataract extraction    . Colonoscopy  2014     Allergies  Allergen Reactions  . Bee Venom   . Oxycodone Other (See Comments)  Mental status changes  . Oxycontin [Oxycodone Hcl] Other (See Comments)    Mental status changes per pt      Family History  Problem Relation Age of Onset  . Dementia Father   . Heart disease Brother   . Clotting disorder Brother   . Psychiatric Illness Sister      Social History Mr. Dominik reports that he quit smoking about 10 years ago. His smoking use included Cigarettes. He has a 10 pack-year smoking history. He has never used smokeless tobacco. Mr. Zoeller reports that he does not drink alcohol.   Review of Systems CONSTITUTIONAL: fatigue HEENT: Eyes: No visual loss, blurred vision, double vision or yellow sclerae.No hearing loss, sneezing, congestion, runny nose or sore throat.  SKIN: No rash or itching.  CARDIOVASCULAR: per HPI RESPIRATORY: No shortness of breath, cough or sputum.  GASTROINTESTINAL: No anorexia,  nausea, vomiting or diarrhea. No abdominal pain or blood.  GENITOURINARY: No burning on urination, no polyuria NEUROLOGICAL: No headache, dizziness, syncope, paralysis, ataxia, numbness or tingling in the extremities. No change in bowel or bladder control.  MUSCULOSKELETAL: No muscle, back pain, joint pain or stiffness.  LYMPHATICS: No enlarged nodes. No history of splenectomy.  PSYCHIATRIC: No history of depression or anxiety.  ENDOCRINOLOGIC: No reports of sweating, cold or heat intolerance. No polyuria or polydipsia.  [name]   Physical Examination Filed Vitals:   06/28/15 1324  BP: 134/60  Pulse: 54   Filed Vitals:   06/28/15 1324  Height: 5\' 11"  (1.803 m)  Weight: 221 lb 6.4 oz (100.426 kg)    Gen: resting comfortably, no acute distress HEENT: no scleral icterus, pupils equal round and reactive, no palptable cervical adenopathy,  CV: RRR, no m/r/g, no JVD Resp: Clear to auscultation bilaterally GI: abdomen is soft, non-tender, non-distended, normal bowel sounds, no hepatosplenomegaly MSK: extremities are warm, no edema.  Skin: warm, no rash Neuro:  no focal deficits Psych: appropriate affect   Diagnostic Studies  05/2015 echo Study Conclusions  - Left ventricle: The cavity size was normal. Wall thickness was increased in a pattern of mild LVH. Systolic function was normal. The estimated ejection fraction was in the range of 60% to 65%. Frequent ventricular ectopy prohibts evaluation of diastolic function. Wall motion was normal; there were no regional wall motion abnormalities. - Aortic valve: Mildly calcified annulus. Trileaflet; mildly thickened leaflets. Valve area (VTI): 2.84 cm^2. Valve area (Vmax): 2.63 cm^2. - Mitral valve: Mildly calcified annulus. Mildly thickened leaflets . - Left atrium: The atrium was mildly dilated. - Frequent PVCs are noted during the exam, correlate clinically. - Technicallya adequate study.   Assessment and Plan  1.  PVCs - fairly mild symptoms - will obtain 48 holter to quantify PVC burden and monitor for any nonsustained or sustained ventricular arrhythmias. Recent K normal, do not see recent Mg, if heavy PVC burden will need to obtain - echo without evidence of significant structural heart disease - pending test results, consider clearance for cardiac rehab, recently referred by his heme/onc physician.   2. PE - management per heme/onc - repeat echo shows resolution of right heart strain  3. Dizziness - orthostatic by history, patient endorses poor oral intake - encouraged increased water and electrolyte rich fluid intake. Counseled caffeinated beverages serve as diuretic. F/u monitor to see if any arrhythmias correspond with symptoms    Arnoldo Lenis, M.D.

## 2015-06-29 ENCOUNTER — Ambulatory Visit (HOSPITAL_BASED_OUTPATIENT_CLINIC_OR_DEPARTMENT_OTHER): Payer: Medicare Other | Attending: Medical | Admitting: Radiology

## 2015-06-29 ENCOUNTER — Encounter (HOSPITAL_COMMUNITY): Payer: Medicare Other

## 2015-06-29 VITALS — Ht 70.0 in | Wt 221.0 lb

## 2015-06-29 DIAGNOSIS — G4733 Obstructive sleep apnea (adult) (pediatric): Secondary | ICD-10-CM | POA: Diagnosis not present

## 2015-06-29 DIAGNOSIS — G4731 Primary central sleep apnea: Secondary | ICD-10-CM | POA: Insufficient documentation

## 2015-06-29 DIAGNOSIS — Z9989 Dependence on other enabling machines and devices: Secondary | ICD-10-CM

## 2015-06-29 DIAGNOSIS — G4736 Sleep related hypoventilation in conditions classified elsewhere: Secondary | ICD-10-CM | POA: Insufficient documentation

## 2015-06-29 DIAGNOSIS — R0683 Snoring: Secondary | ICD-10-CM | POA: Diagnosis not present

## 2015-06-29 DIAGNOSIS — I493 Ventricular premature depolarization: Secondary | ICD-10-CM | POA: Insufficient documentation

## 2015-07-02 ENCOUNTER — Ambulatory Visit (HOSPITAL_BASED_OUTPATIENT_CLINIC_OR_DEPARTMENT_OTHER): Payer: Medicare Other | Admitting: Internal Medicine

## 2015-07-02 ENCOUNTER — Ambulatory Visit: Payer: Medicare Other | Admitting: Internal Medicine

## 2015-07-02 DIAGNOSIS — G4733 Obstructive sleep apnea (adult) (pediatric): Secondary | ICD-10-CM | POA: Diagnosis not present

## 2015-07-02 NOTE — Progress Notes (Signed)
   Patient Name: Christian [name], Christian [name] Date: 06/29/2015 Gender: Male D.O.B: 05-06-1941 Age (years): 39 Referring Provider: Chana Bode Height (inches): 15 Interpreting Physician: Baird Lyons MD, ABSM Weight (lbs): 221 RPSGT: Laren Everts BMI: 32 MRN: [id number] Neck Size: 16.50 CLINICAL INFORMATION Sleep Study Type: NPSG  Indication for sleep study: Diabetes, Fatigue, Obesity, OSA, Snoring, Witnessed Apneas  Epworth Sleepiness Score: 13  SLEEP STUDY TECHNIQUE As per the AASM Manual for the Scoring of Sleep and Associated Events v2.3 (April 2016) with a hypopnea requiring 4% desaturations.  The channels recorded and monitored were frontal, central and occipital EEG, electrooculogram (EOG), submentalis EMG (chin), nasal and oral airflow, thoracic and abdominal wall motion, anterior tibialis EMG, snore microphone, electrocardiogram, and pulse oximetry.  MEDICATIONS Patient's medications include: Charted for review. Medications self-administered by patient during sleep study : No sleep medicine administered.  SLEEP ARCHITECTURE The study was initiated at 10:08:02 PM and ended at 4:45:31 AM.  Sleep onset time was 12.7 minutes and the sleep efficiency was 39.6%. The total sleep time was 157.5 minutes.  Stage REM latency was N/A minutes.  The patient spent 43.81% of the night in stage N1 sleep, 56.19% in stage N2 sleep, 0.00% in stage N3 and 0.00% in REM.  Alpha intrusion was absent.  Supine sleep was 0.00%.  Awake after sleep onset 227.2 minutes with frequent spontaneous awakenings through the study   RESPIRATORY PARAMETERS The overall apnea/hypopnea index (AHI) was 21.0 per hour. There were 39 total apneas, including 0 obstructive, 37 central and 2 mixed apneas. There were 16 hypopneas and 58 RERAs.  The AHI during Stage REM sleep was N/A per hour.  AHI while supine was N/A per hour.  The mean oxygen saturation was 95.23%. The minimum SpO2 during sleep was  89.00%.  Moderate snoring was noted during this study.  CARDIAC DATA The 2 lead EKG demonstrated sinus rhythm. The mean heart rate was 65.01 beats per minute. Other EKG findings include: PVCs including bigeminy and multiform.  LEG MOVEMENT DATA The total PLMS were 0 with a resulting PLMS index of 0.00. Associated arousal with leg movement index was 0.0 .  IMPRESSIONS Moderate obstructive and central sleep apnea occurred during this study (AHI = 21.0/h). Frequent awakenings with insufficient early sleep - protocol requirement for split CPAP titration was not met. Mild central sleep apnea occurred during this study (CAI = 14.1/h). Oxygen desaturation was noted during this study (Min O2 = 89.00%). No sleep time recorded with O2 sat <= 88% on room air. The patient snored with Moderate snoring volume. EKG findings include PVCs. Clinically significant periodic limb movements did not occur during sleep. No significant associated arousals.  DIAGNOSIS Obstructive Sleep Apnea (327.23 [G47.33 ICD-10]) Central Sleep Apnea (327.21 [G47.37 ICD-10]) Nocturnal Hypoxemia (327.26 [G47.36 ICD-10])  RECOMMENDATIONS CPAP titration to determine optimal pressure required to alleviate sleep disordered breathing. BiPAP or ASV titration may be required to eliminate central sleep apnea. Avoid alcohol, sedatives and other CNS depressants that may worsen sleep apnea and disrupt normal sleep architecture. Sleep hygiene should be reviewed to assess factors that may improve sleep quality. Weight management and regular exercise should be initiated or continued if appropriate.  Deneise Lever Diplomate, American Board of Sleep Medicine  ELECTRONICALLY SIGNED ON:  07/02/2015, 12:47 PM [place] PH: [phone]   FX: [phone] [place]

## 2015-07-03 ENCOUNTER — Telehealth: Payer: Self-pay | Admitting: Medical

## 2015-07-03 ENCOUNTER — Other Ambulatory Visit: Payer: Self-pay | Admitting: Medical

## 2015-07-03 NOTE — Telephone Encounter (Signed)
pls inquire what the issue is?  After his recent labs, I changed the medication regimen including starting Invokamet which includes Invokana and Metformin combined.  Is he having side effects, problems?

## 2015-07-03 NOTE — Telephone Encounter (Signed)
Rcvd refill request fr Invokana 100mg . Note from pharmacy states that pt would like to continue on Invokana & pt does not want Invokamet

## 2015-07-04 ENCOUNTER — Encounter (HOSPITAL_COMMUNITY): Payer: Medicare Other

## 2015-07-04 ENCOUNTER — Other Ambulatory Visit: Payer: Self-pay | Admitting: Medical

## 2015-07-04 MED ORDER — CANAGLIFLOZIN 100 MG PO TABS
100.0000 mg | ORAL_TABLET | Freq: Every day | ORAL | Status: DC
Start: 2015-07-04 — End: 2015-08-15

## 2015-07-04 NOTE — Telephone Encounter (Signed)
Pt wife called back and states that pt still has a month and half left of metformin and doesn't want to start on invokanamet until he finishes the metformin. Wife is wanting to know if you can send in a 2 month supply of invokana so he can finish it with metformin and then when they run out they can start on the combination med. Send med to [facility] and please notify pt when it has been sent to pharmacy

## 2015-07-04 NOTE — Telephone Encounter (Signed)
Plain invokana sent, but when he runs out of Metformin, we will need to change to invokament

## 2015-07-04 NOTE — Telephone Encounter (Signed)
I HAVE LEFT MESSAGE FOR PT TO CALL BACK

## 2015-07-06 ENCOUNTER — Encounter (HOSPITAL_COMMUNITY): Payer: Medicare Other | Admitting: Physical Therapy

## 2015-07-07 DIAGNOSIS — M2042 Other hammer toe(s) (acquired), left foot: Secondary | ICD-10-CM | POA: Diagnosis not present

## 2015-07-07 DIAGNOSIS — M79672 Pain in left foot: Secondary | ICD-10-CM | POA: Diagnosis not present

## 2015-07-10 ENCOUNTER — Telehealth: Payer: Self-pay | Admitting: Medical

## 2015-07-10 NOTE — Telephone Encounter (Signed)
Sleep study +.  Please find out who his current home health company is, refer back there for renewal/update on CPAP supplies, send the up to date study results.     He will need to recheck OV in 3 wk on CPAP.

## 2015-07-10 NOTE — Telephone Encounter (Signed)
Called pt and got no answer

## 2015-07-10 NOTE — Telephone Encounter (Signed)
Pt's wife called and requested results of sleep study and Holter monitor study. Please call [name] at  [phone]

## 2015-07-11 ENCOUNTER — Emergency Department (HOSPITAL_COMMUNITY)
Admission: EM | Admit: 2015-07-11 | Discharge: 2015-07-11 | Disposition: A | Discharge status: Home / Self Care | Payer: Medicare Other | Attending: Emergency Medicine | Admitting: Emergency Medicine

## 2015-07-11 ENCOUNTER — Encounter (HOSPITAL_COMMUNITY): Payer: Medicare Other | Admitting: Physical Therapy

## 2015-07-11 ENCOUNTER — Emergency Department (HOSPITAL_COMMUNITY): Payer: Medicare Other

## 2015-07-11 ENCOUNTER — Telehealth: Payer: Self-pay | Admitting: Medical

## 2015-07-11 ENCOUNTER — Encounter (HOSPITAL_COMMUNITY): Payer: Self-pay | Admitting: Neurology

## 2015-07-11 DIAGNOSIS — Z87891 Personal history of nicotine dependence: Secondary | ICD-10-CM | POA: Insufficient documentation

## 2015-07-11 DIAGNOSIS — E782 Mixed hyperlipidemia: Secondary | ICD-10-CM | POA: Insufficient documentation

## 2015-07-11 DIAGNOSIS — G4733 Obstructive sleep apnea (adult) (pediatric): Secondary | ICD-10-CM | POA: Diagnosis not present

## 2015-07-11 DIAGNOSIS — Z86711 Personal history of pulmonary embolism: Secondary | ICD-10-CM | POA: Insufficient documentation

## 2015-07-11 DIAGNOSIS — I10 Essential (primary) hypertension: Secondary | ICD-10-CM | POA: Insufficient documentation

## 2015-07-11 DIAGNOSIS — R29810 Facial weakness: Secondary | ICD-10-CM | POA: Diagnosis not present

## 2015-07-11 DIAGNOSIS — E119 Type 2 diabetes mellitus without complications: Secondary | ICD-10-CM | POA: Insufficient documentation

## 2015-07-11 DIAGNOSIS — Z79899 Other long term (current) drug therapy: Secondary | ICD-10-CM | POA: Diagnosis not present

## 2015-07-11 DIAGNOSIS — Z0389 Encounter for observation for other suspected diseases and conditions ruled out: Secondary | ICD-10-CM | POA: Diagnosis not present

## 2015-07-11 DIAGNOSIS — Z9981 Dependence on supplemental oxygen: Secondary | ICD-10-CM | POA: Insufficient documentation

## 2015-07-11 DIAGNOSIS — H269 Unspecified cataract: Secondary | ICD-10-CM | POA: Insufficient documentation

## 2015-07-11 DIAGNOSIS — R531 Weakness: Secondary | ICD-10-CM | POA: Diagnosis not present

## 2015-07-11 DIAGNOSIS — R4781 Slurred speech: Secondary | ICD-10-CM | POA: Diagnosis not present

## 2015-07-11 DIAGNOSIS — G8929 Other chronic pain: Secondary | ICD-10-CM | POA: Insufficient documentation

## 2015-07-11 DIAGNOSIS — R4701 Aphasia: Secondary | ICD-10-CM | POA: Diagnosis present

## 2015-07-11 LAB — COMPREHENSIVE METABOLIC PANEL
ALBUMIN: 3.5 g/dL (ref 3.5–5.0)
ALK PHOS: 67 U/L (ref 38–126)
ALT: 18 U/L (ref 17–63)
AST: 22 U/L (ref 15–41)
Anion gap: 11 (ref 5–15)
BILIRUBIN TOTAL: 0.6 mg/dL (ref 0.3–1.2)
BUN: 18 mg/dL (ref 6–20)
CALCIUM: 9.2 mg/dL (ref 8.9–10.3)
CO2: 21 mmol/L — AB (ref 22–32)
Chloride: 105 mmol/L (ref 101–111)
Creatinine, Ser: 0.94 mg/dL (ref 0.61–1.24)
GFR calc Af Amer: >60 mL/min (ref >60)
GFR calc non Af Amer: >60 mL/min (ref >60)
GLUCOSE: 144 mg/dL — AB (ref 65–99)
Potassium: 4 mmol/L (ref 3.5–5.1)
Sodium: 137 mmol/L (ref 135–145)
TOTAL PROTEIN: 6.2 g/dL — AB (ref 6.5–8.1)

## 2015-07-11 LAB — DIFFERENTIAL
BASOS ABS: 0.1 10*3/uL (ref 0.0–0.1)
Basophils Relative: 1 % (ref 0–1)
Eosinophils Absolute: 0.1 10*3/uL (ref 0.0–0.7)
Eosinophils Relative: 2 % (ref 0–5)
LYMPHS PCT: 18 % (ref 12–46)
Lymphs Abs: 1.1 10*3/uL (ref 0.7–4.0)
Monocytes Absolute: 0.6 10*3/uL (ref 0.1–1.0)
Monocytes Relative: 10 % (ref 3–12)
NEUTROS PCT: 69 % (ref 43–77)
Neutro Abs: 4.3 10*3/uL (ref 1.7–7.7)

## 2015-07-11 LAB — CBC
HCT: 44.8 % (ref 39.0–52.0)
Hemoglobin: 14.9 g/dL (ref 13.0–17.0)
MCH: 29.3 pg (ref 26.0–34.0)
MCHC: 33.3 g/dL (ref 30.0–36.0)
MCV: 88 fL (ref 78.0–100.0)
Platelets: 227 10*3/uL (ref 150–400)
RBC: 5.09 MIL/uL (ref 4.22–5.81)
RDW: 13.4 % (ref 11.5–15.5)
WBC: 6.2 10*3/uL (ref 4.0–10.5)

## 2015-07-11 LAB — I-STAT CHEM 8, ED
BUN: 21 mg/dL — AB (ref 6–20)
Calcium, Ion: 1.19 mmol/L (ref 1.13–1.30)
Chloride: 106 mmol/L (ref 101–111)
Creatinine, Ser: 0.9 mg/dL (ref 0.61–1.24)
Glucose, Bld: 145 mg/dL — ABNORMAL HIGH (ref 65–99)
HEMATOCRIT: 48 % (ref 39.0–52.0)
HEMOGLOBIN: 16.3 g/dL (ref 13.0–17.0)
Potassium: 4 mmol/L (ref 3.5–5.1)
SODIUM: 140 mmol/L (ref 135–145)
TCO2: 22 mmol/L (ref 0–100)

## 2015-07-11 LAB — PROTIME-INR
INR: 1.15 (ref 0.00–1.49)
Prothrombin Time: 14.9 seconds (ref 11.6–15.2)

## 2015-07-11 LAB — BRAIN NATRIURETIC PEPTIDE: B NATRIURETIC PEPTIDE 5: 176.5 pg/mL — AB (ref 0.0–100.0)

## 2015-07-11 LAB — APTT: APTT: 33 s (ref 24–37)

## 2015-07-11 LAB — I-STAT TROPONIN, ED: Troponin i, poc: 0 ng/mL (ref 0.00–0.08)

## 2015-07-11 NOTE — ED Notes (Signed)
RN attempted IV start x 2;2nd RN to start IV 

## 2015-07-11 NOTE — ED Provider Notes (Signed)
CSN: [id number]     Arrival date & time 07/11/15  1627 History   First MD Initiated Contact with Patient 07/11/15 1951     Chief Complaint  Patient presents with  . Aphasia     (Consider location/radiation/quality/duration/timing/severity/associated sxs/prior Treatment) HPI Comments: Here with gradually worsening weakness and aphasia since March. In March was diagnosed with large saddle pulmonary embolus and started on Xarelto. He spent some time in the hospital and also was diagnosed with factor V Leiden. He reports worsening shortness of breath with exertion and mild weakness that is worsening over the past several months. He was calling his neurologist to get an appointment confirmed and they recommended he come to the ED with his symptoms.  Patient is a 74 y.o. male presenting with weakness. The history is provided by the patient.  Weakness This is a chronic problem. The current episode started more than 1 week ago. The problem occurs constantly. The problem has been gradually worsening. Associated symptoms include shortness of breath (with ambulation). Pertinent negatives include no abdominal pain. Nothing aggravates the symptoms. Nothing relieves the symptoms.    Past Medical History  Diagnosis Date  . High cholesterol   . Hypertension   . OSA on CPAP   . Insomnia   . Pulmonary embolism 01/2015  . DVT (deep venous thrombosis) 01/2015  . Diabetes     type 2  . Chronic back pain   . Spinal stenosis     lumbar  . Leg pain, diffuse   . Cataract   . Tremor     on propranolol  . Wears glasses   . Hearing impaired   . Mild cognitive impairment     sees [facility]  . Memory loss   . Insomnia   . Numbness     fingers, feet, toes  . Leg swelling   . Confusion   . Agitation   . 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 06/14/2015   Past Surgical History  Procedure Laterality Date  . Tonsillectomy    . Cataract extraction     . Colonoscopy  2014   Family History  Problem Relation Age of Onset  . Dementia Father   . Heart disease Brother   . Clotting disorder Brother   . Psychiatric Illness Sister    Social History  Substance Use Topics  . Smoking status: Former Smoker -- 1.00 packs/day for 10 years    Types: Cigarettes    Quit date: 11/25/2004  . Smokeless tobacco: Never Used  . Alcohol Use: No     Comment: drinks 4 cups of coffee daily    Review of Systems  Constitutional: Negative for fever.  Respiratory: Positive for shortness of breath (with ambulation). Negative for cough.   Gastrointestinal: Negative for vomiting and abdominal pain.  Neurological: Positive for weakness.  All other systems reviewed and are negative.     Allergies  Bee venom; Oxycodone; and Oxycontin  Home Medications   Prior to Admission medications   Medication Sig Start Date End Date Taking? Authorizing Provider  canagliflozin (INVOKANA) 100 MG TABS tablet Take 1 tablet (100 mg total) by mouth daily. 1 tablet po daily before first meal 07/04/15   Camelia Eng Tysinger, PA-C  cyclobenzaprine (FLEXERIL) 10 MG tablet Take 1 tablet (10 mg total) by mouth at bedtime. 06/20/15   Camelia Eng Tysinger, PA-C  donepezil (ARICEPT) 10 MG tablet Take 1 tablet (10 mg total) by mouth at bedtime. 04/20/15  Garvin Fila, MD  EPINEPHrine (EPIPEN 2-PAK) 0.3 mg/0.3 mL IJ SOAJ injection Inject 0.3 mLs (0.3 mg total) into the muscle once. 03/06/15   Camelia Eng Tysinger, PA-C  Liraglutide (VICTOZA) 18 MG/3ML SOPN Inject into the skin.    Historical Provider, MD  metFORMIN (GLUCOPHAGE-XR) 500 MG 24 hr tablet Take 500 mg by mouth 2 (two) times daily with a meal. Take 2 two times daily    Historical Provider, MD  Misc Natural Products (OSTEO BI-FLEX ADV JOINT SHIELD PO) Take by mouth.    Historical Provider, MD  rivaroxaban (XARELTO) 20 MG TABS tablet Take 1 tablet (20 mg total) by mouth daily with supper. 06/20/15   Camelia Eng Tysinger, PA-C  traMADol (ULTRAM)  50 MG tablet Take 1 tablet (50 mg total) by mouth every 8 (eight) hours as needed. FOR PAIN 06/07/15   Camelia Eng Tysinger, PA-C   BP 119/72 mmHg  Pulse 69  Temp(Src) 98.5 F (36.9 C) (Oral)  Resp 22  Ht 5\' 10"  (1.778 m)  Wt 222 lb (100.699 kg)  BMI 31.85 kg/m2  SpO2 99% Physical Exam  Constitutional: He is oriented to person, place, and time. He appears well-developed and well-nourished. No distress.  HENT:  Head: Normocephalic and atraumatic.  Mouth/Throat: Oropharynx is clear and moist. No oropharyngeal exudate.  Eyes: EOM are normal. Pupils are equal, round, and reactive to light.  Neck: Normal range of motion. Neck supple.  Cardiovascular: Normal rate and regular rhythm.  Exam reveals no friction rub.   No murmur heard. Pulmonary/Chest: Effort normal and breath sounds normal. No respiratory distress. He has no wheezes. He has no rales.  Abdominal: Soft. He exhibits no distension. There is no tenderness. There is no rebound.  Musculoskeletal: Normal range of motion. He exhibits no edema.  Neurological: He is alert and oriented to person, place, and time. No cranial nerve deficit. He exhibits normal muscle tone. Coordination normal.  Very mild slurred speech. Speech in clear sentences.  Skin: No rash noted. He is not diaphoretic.  Nursing note and vitals reviewed.   ED Course  Procedures (including critical care time) Labs Review Labs Reviewed  COMPREHENSIVE METABOLIC PANEL - Abnormal; Notable for the following:    CO2 21 (*)    Glucose, Bld 144 (*)    Total Protein 6.2 (*)    All other components within normal limits  BRAIN NATRIURETIC PEPTIDE - Abnormal; Notable for the following:    B Natriuretic Peptide 176.5 (*)    All other components within normal limits  I-STAT CHEM 8, ED - Abnormal; Notable for the following:    BUN 21 (*)    Glucose, Bld 145 (*)    All other components within normal limits  PROTIME-INR  APTT  CBC  DIFFERENTIAL  I-STAT TROPOININ, ED     Imaging Review Ct Head Wo Contrast  07/11/2015   CLINICAL DATA:  Chronic progressive drooping of the right eyelid, facial droop, and slurred speech since March, 2016.  EXAM: CT HEAD WITHOUT CONTRAST  TECHNIQUE: Contiguous axial images were obtained from the base of the skull through the vertex without intravenous contrast.  COMPARISON:  None.  FINDINGS: Mild age-appropriate cortical and deep atrophy. Mild changes of small vessel disease of the white matter diffusely. Ventricular system normal in size and appearance for age. No mass lesion. No midline shift. No acute hemorrhage or hematoma. No extra-axial fluid collections. No evidence of acute infarction.  No skull fracture or other focal osseous abnormality involving the skull.  Visualized paranasal sinuses, bilateral mastoid air cells and bilateral middle ear cavities well-aerated.  IMPRESSION: 1. No acute intracranial abnormality. 2. Mild age-appropriate generalized atrophy and mild chronic microvascular ischemic changes of the white matter.   Electronically Signed   By: Evangeline Dakin M.D.   On: 07/11/2015 18:14   I have personally reviewed and evaluated these images and lab results as part of my medical decision-making.   EKG Interpretation   Date/Time:  Tuesday July 11 2015 17:27:10 EDT Ventricular Rate:  103 PR Interval:  190 QRS Duration: 136 QT Interval:  408 QTC Calculation: 534 R Axis:   -63 Text Interpretation:  Sinus bradycardia with frequent and consecutive  Premature ventricular complexes Right bundle branch block Left anterior  fascicular block Bifasicular block Minimal voltage criteria for LVH, may  be normal variant Possible Lateral infarct , age undetermined Abnormal ECG  Similar to prior Confirmed by Mingo Amber  MD, [place] ([id number]) on 07/11/2015  11:30:05 PM      MDM   Final diagnoses:  Weakness    74 year old male here with weakness. He's had difficulty moving around, some aphasia and slurred speech, and some  right eyelid droop since March. In March he had a large saddle PE-was diagnosed with factor V Leiden mutation. His neurologist instructed him to come to the ER today for further evaluation. He does have mild history of dementia. Here he is doing well, he is neurologically intact. He has very mild slurred speech but no dysarthria that makes him hard to understand. No chest pain or shortness of breath. He has good strength in all extremities and no facial weakness or numbness. Will MRI his brain. His labs are fairly normal, will add a BNP to the triage labs. Monitor shows some bigeminy/trigeminy. MR unremarkable. Labs ok. Stable for discharge.   Evelina Bucy, MD 07/11/15 [phone]

## 2015-07-11 NOTE — Telephone Encounter (Signed)
Pt wife called again and wants the results of the sleep study and Holter monitor study, pt can be reached and [phone]

## 2015-07-11 NOTE — ED Notes (Signed)
MD at bedside. 

## 2015-07-11 NOTE — Telephone Encounter (Signed)
I have tried to call with no answer again

## 2015-07-11 NOTE — Telephone Encounter (Signed)
Rn talk to patients wife about her concerns for a possible stroke. Pts wife stated Christian [name] is having more moderate dementia, speech is a little more slurred, also he is having a eye drooping, and he is having mild shakes. Nurse advise to take patient to the nearest  emergency room for a possible stroke. Pts wifet stated he had an appt with Dr [name] for follow up on Thursday. Pts wife stated that pt has seen other doctors since March 2016 and they did not seem concern.  Rn explain to wife that I was concern for her patient. Nurse advise wife to take him to the hospital immediately. Pt stated her husband stated it was not that serious to go the ED.PTs wife stated she will ask her husband.

## 2015-07-11 NOTE — Telephone Encounter (Signed)
Please see other message about results and CPAP supply order recommendations

## 2015-07-11 NOTE — Telephone Encounter (Signed)
I spoke with wife and informed her that someone would call her back today.  She states she is on the way to ER and would like to know the results prior to them going.  I advised that [facility] facility will have the sleep study results also & advised it's important that they go to ER per instructions from RN @ Dr. Clydene Fake office.   I spoke with [name] and advised that Sleep study is abnormal, pt has Sleep apnea, & he needs to stay on CPAP, need to refer to his [place] for updated supplies etc then recheck here in 3 weeks.  (need name of his [place]) Tried calling wife twice at [phone] and there was no answer

## 2015-07-11 NOTE — Telephone Encounter (Signed)
I called the patient's listed number but got no reply and was unable to leave a message on the answering machine

## 2015-07-11 NOTE — Telephone Encounter (Signed)
Pt's wife called back stating she was waiting to hear back from the nurse about the pt. Nurse spoke with wife today at 2:30p. I relayed to her what the nurses note said. Told wife that nurse felt she needed to take the pt to he ER immediatly. I had to repeat my self several times. She wanted to know if Dr. [name] would come and see the pt, if we would be calling the ER to let them know they were coming. Several more times she asked if she could speak with the nurse, I told her, again, the nurse recommended  taking the pt to ER. She hung up on me.

## 2015-07-11 NOTE — Telephone Encounter (Signed)
Spoke to pt's wife who is concerned about all the things that have happened since Mr. Trovato' last visit at [facility].  She wants to know what she should look for before it becomes an emergency and she needs to take him to the hospital.  She said his short term memory is even shorter and she feels as though he's had a stroke.  She said there is eye drooping as well.  She wanted Dr. [name] to look at his chart and see everything that has been done since his last visit.  She can be reached at they're home number of [phone].

## 2015-07-11 NOTE — ED Notes (Signed)
PT acknowledges taking home all his belongings.

## 2015-07-11 NOTE — Discharge Instructions (Signed)

## 2015-07-11 NOTE — ED Notes (Signed)
Patient transported to MRI 

## 2015-07-11 NOTE — ED Notes (Signed)
Pt here with right eyelid droop and slurred speech that has been gradually progressing since march. Today the neurologist called to confirm appt and pt reported s/s and sent here. Also reports dementia dx. Pt is a x 4. Neuro intact, no new s/s since march. Has hx of low HR.

## 2015-07-13 ENCOUNTER — Encounter (HOSPITAL_COMMUNITY): Payer: Medicare Other

## 2015-07-18 ENCOUNTER — Encounter (HOSPITAL_COMMUNITY): Payer: Medicare Other | Admitting: Physical Therapy

## 2015-07-18 NOTE — Telephone Encounter (Signed)
Talk to patients wife kathy at [phone]. Patients wife stated she did take her husband to the emergency room. Pt was not admitted and had a MRI and EKG. She states they did not find anything out from the test, and the test was okay. Pt stated her husband has an appt with Dr [name] on 07-25-15 and will be at the appt. Her husband is doing fine.

## 2015-07-20 ENCOUNTER — Encounter (HOSPITAL_COMMUNITY): Payer: Medicare Other | Admitting: Physical Therapy

## 2015-07-25 ENCOUNTER — Encounter: Payer: Self-pay | Admitting: Neurology

## 2015-07-25 ENCOUNTER — Ambulatory Visit (INDEPENDENT_AMBULATORY_CARE_PROVIDER_SITE_OTHER): Payer: Medicare Other | Admitting: Neurology

## 2015-07-25 VITALS — BP 118/53 | HR 41 | Ht 70.0 in | Wt 222.8 lb

## 2015-07-25 DIAGNOSIS — G3184 Mild cognitive impairment, so stated: Secondary | ICD-10-CM | POA: Diagnosis not present

## 2015-07-25 NOTE — Patient Instructions (Signed)
I had a long discussion with the patient and his wife regarding his mild cognitive impairment which appears to be stable. He is having significant bradycardia and I'm concerned about Aricept which may possibly contribute but clearly seems to be helping him from cognitive standpoint. I have advised him to discuss with his cardiologist further evaluation and if Aricept is indeed the culprit may consider stopping it and changing over to [place]. He was advised to increase participation in cognitively challenging task like playing bridge, solving crossword puzzles for sudoku. He will return for follow-up in 6 months or call earlier if necessary  Memory Compensation Strategies  1. Use "WARM" strategy.  W= write it down  A= associate it  R= repeat it  M= make a mental note  2.   You can keep a Social worker.  Use a 3-ring notebook with sections for the following: calendar, important names and phone numbers,  medications, doctors' names/phone numbers, lists/reminders, and a section to journal what you did  each day.   3.    Use a calendar to write appointments down.  4.    Write yourself a schedule for the day.  This can be placed on the calendar or in a separate section of the Memory Notebook.  Keeping a  regular schedule can help memory.  5.    Use medication organizer with sections for each day or morning/evening pills.  You may need help loading it  6.    Keep a basket, or pegboard by the door.  Place items that you need to take out with you in the basket or on the pegboard.  You may also want to  include a message board for reminders.  7.    Use sticky notes.  Place sticky notes with reminders in a place where the task is performed.  For example: " turn off the  stove" placed by the stove, "lock the door" placed on the door at eye level, " take your medications" on  the bathroom mirror or by the place where you normally take your medications.  8.    Use alarms/timers.  Use while cooking  to remind yourself to check on food or as a reminder to take your medicine, or as a  reminder to make a call, or as a reminder to perform another task, etc.
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
## 2015-07-28 DIAGNOSIS — M2042 Other hammer toe(s) (acquired), left foot: Secondary | ICD-10-CM | POA: Diagnosis not present

## 2015-07-28 DIAGNOSIS — M71572 Other bursitis, not elsewhere classified, left ankle and foot: Secondary | ICD-10-CM | POA: Diagnosis not present

## 2015-07-28 DIAGNOSIS — E119 Type 2 diabetes mellitus without complications: Secondary | ICD-10-CM | POA: Diagnosis not present

## 2015-07-28 DIAGNOSIS — Z79899 Other long term (current) drug therapy: Secondary | ICD-10-CM | POA: Diagnosis not present

## 2015-08-07 ENCOUNTER — Other Ambulatory Visit: Payer: Self-pay | Admitting: Medical

## 2015-08-08 ENCOUNTER — Other Ambulatory Visit: Payer: Self-pay

## 2015-08-08 MED ORDER — TRAMADOL HCL 50 MG PO TABS: ORAL_TABLET | ORAL | Status: DC

## 2015-08-08 NOTE — Telephone Encounter (Signed)
Called med in called pt to inform him he needed a follow up appointment spoke wife she said she will call back

## 2015-08-08 NOTE — Telephone Encounter (Signed)
Is this okay?

## 2015-08-08 NOTE — Telephone Encounter (Signed)
Call out 1 mo supply, and make f/u appt

## 2015-08-15 ENCOUNTER — Telehealth: Payer: Self-pay | Admitting: Medical

## 2015-08-15 ENCOUNTER — Encounter: Payer: Self-pay | Admitting: Family Medicine

## 2015-08-15 ENCOUNTER — Ambulatory Visit (INDEPENDENT_AMBULATORY_CARE_PROVIDER_SITE_OTHER): Payer: Medicare Other | Admitting: Family Medicine

## 2015-08-15 VITALS — BP 112/70 | HR 46 | Resp 12 | Ht 69.0 in | Wt 217.0 lb

## 2015-08-15 DIAGNOSIS — I493 Ventricular premature depolarization: Secondary | ICD-10-CM

## 2015-08-15 DIAGNOSIS — R9431 Abnormal electrocardiogram [ECG] [EKG]: Secondary | ICD-10-CM | POA: Diagnosis not present

## 2015-08-15 NOTE — Telephone Encounter (Signed)
Spoke with wife and she scheduled appt for today.  I called [place] t# [phone] s/w [place] and she will fax EKG to [place]

## 2015-08-15 NOTE — Progress Notes (Signed)
Chief Complaint  Patient presents with  . abnormal EKG    had an EKG 1 month ago and was fine. fluctuating pulse. Pulse is thready and not very consistant    Patient presents to discuss abnormal EKG done today.  He was supposed to have a procedure at the surgical center done by a podiatrist, but they refused to do it based on his EKG.  He doesn't feel any different today than in the last few weeks, but over the last month has had some progressive fatigue.   He presents with his wife today.  He is normally seen by [name] in this office.   His history and medications were reviewed, and his wife is helping provide the history. They report that he has pain from his waist to his toes.  They reports that since March, he has been diagnosed with PE and DVT's. Compliant with blood thinner; denies any bleeding.  He has a hammertoe, with a blister.  He had appointment with [place] (podiatrist going to do surgery at the surgical center, per wife).  He wouldn't do the surgery due to abnormal EKG.  Last month he went to ER, thinking he had a stroke. He wore heart monitor for 2 days--wife doesn't think they heard the results.  He also had a sleep test.   She reports that that pulse was in the 20's, 30's.  Pulse today was 120. She is very concerned about the fluctuating pulse.  The patient denies any current chest pain, palpitations, dyspnea, headache or dizziness.  Chart reviewed--Cardiologist is Dr. Harl Bowie in [place]. He first saw him last month. Note reviewed:  1. PVCs - patient noted to have frequent PVCs during recent echocardiogram - reports occasional palpitations, lasts about 1 minute. No other associated symptoms. Ongoing for several years, no change in frequency - notes chest pain at times. Sharp pain midchest, 3/10. No other associatiated symptoms. Lasts 1-2 minutes. Worst with lifting heavy objects.  - Notes dizziness with first standing. Reports poor oral intake.  - several months  of generalized fatigue.    2. PE - 01/2015 CT ches twith large volume PE with evidence of RV strain.  - has been on anticoag since that time. Echo 05/2015 shows resolution of RV strain - anticoag has been managed by his heme/onc physician  48 hour holter monitor results: Monitor shows some occasional extra heart beats but no significant abnormal rhythms. He does not need any further testing for this and is ok to participate in cardiac rehab  He reports that his fatigue has gotten worse since that visit.  PMH, PSH, SH reviewed.  Past Medical History  Diagnosis Date  . High cholesterol   . Hypertension   . OSA on CPAP   . Insomnia   . Pulmonary embolism 01/2015  . DVT (deep venous thrombosis) 01/2015  . Diabetes     type 2  . Chronic back pain   . Spinal stenosis     lumbar  . Leg pain, diffuse   . Cataract   . Tremor     on propranolol  . Wears glasses   . Hearing impaired   . Mild cognitive impairment     sees [facility]  . Memory loss   . Insomnia   . Numbness     fingers, feet, toes  . Leg swelling   . Confusion   . Agitation   . 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 06/14/2015  Outpatient Encounter Prescriptions as of 08/15/2015  Medication Sig Note  . cyclobenzaprine (FLEXERIL) 10 MG tablet Take 1 tablet (10 mg total) by mouth at bedtime.   . donepezil (ARICEPT) 10 MG tablet Take 1 tablet (10 mg total) by mouth at bedtime.   [name] EPINEPHrine (EPIPEN 2-PAK) 0.3 mg/0.3 mL IJ SOAJ injection Inject 0.3 mLs (0.3 mg total) into the muscle once.   Claudine Mouton [phone] MG TABS  07/25/2015: Received from: External Pharmacy  . Misc Natural Products (OSTEO BI-FLEX ADV JOINT SHIELD PO) Take 1 tablet by mouth daily.    . rivaroxaban (XARELTO) 20 MG TABS tablet Take 1 tablet (20 mg total) by mouth daily with supper.   . traMADol (ULTRAM) 50 MG tablet TAKE (1) TABLET EVERY EIGHT HOURS AS NEEDED FOR PAIN.   . TRULICITY 1.5  KY/7.0WC SOPN  07/25/2015: Received from: External Pharmacy  . [DISCONTINUED] canagliflozin (INVOKANA) 100 MG TABS tablet Take 1 tablet (100 mg total) by mouth daily. 1 tablet po daily before first meal   . [DISCONTINUED] Liraglutide (VICTOZA) 18 MG/3ML SOPN Inject 18 mg into the skin daily.    . [DISCONTINUED] metFORMIN (GLUCOPHAGE-XR) 500 MG 24 hr tablet Take 500 mg by mouth 2 (two) times daily with a meal. Take 2 two times daily    No facility-administered encounter medications on file as of 08/15/2015.   Allergies  Allergen Reactions  . Bee Venom   . Oxycodone Other (See Comments)    Mental status changes  . Oxycontin [Oxycodone Hcl] Other (See Comments)    Mental status changes per pt   ROS: no fever, chills, headaches, dizziness, URI symptoms, cough, shortness of breath, edema, bleeding, bruising, chest pain.  No GI complaints. See HPI.  PHYSICAL EXAM:  BP 112/70 mmHg  Pulse 46  Resp 12  Ht 5\' 9"  (1.753 m)  Wt 217 lb (98.431 kg)  BMI 32.03 kg/m2 Elderly male, in no distress.  Answers questions appropriately Neck: no lymphadenopathy, thyromegaly or mass Heart: irregular--Frequent ectopy vs irregularly irregular. Bouts where it sounds like bigeminy. Pulse 64 when counting with stethoscope.   Pulse in wrist--feels irregularly irregular, with long pauses, in 40's. Abdomen: soft, nontender, no mass Feet are cool, brisk capillary refill, no edema  EKG:  Sinus rhythm with frequent PVC's. Bigeminy noted on EKG, but much more irregular pattern of premature beats on the rhythm strip, which doesn't appear to be too significantly different from rhythm seen on last EKG in computer.    Chemistry      Component Value Date/Time   NA 140 07/11/2015 1744   K 4.0 07/11/2015 1744   CL 106 07/11/2015 1744   CO2 21* 07/11/2015 1740   BUN 21* 07/11/2015 1744   CREATININE 0.90 07/11/2015 1744   CREATININE 0.80 06/20/2015 0001      Component Value Date/Time   CALCIUM 9.2 07/11/2015 1740    ALKPHOS 67 07/11/2015 1740   AST 22 07/11/2015 1740   ALT 18 07/11/2015 1740   BILITOT 0.6 07/11/2015 1740     Lab Results  Component Value Date   HGBA1C 8.0* 06/20/2015   Lab Results  Component Value Date   WBC 6.2 07/11/2015   HGB 16.3 07/11/2015   HCT 48.0 07/11/2015   MCV 88.0 07/11/2015   PLT 227 07/11/2015   Lab Results  Component Value Date   TSH 0.789 06/20/2015    ASSESSMENT/PLAN:  Nonspecific abnormal electrocardiogram (ECG) (EKG)  Frequent PVCs - Reassured re: pulse rate; no concerning rhythm noted. Needs  card clearance to podiatrist.   Send copies to Dr. Harl Bowie Have him give podiatrist clearance if okay for surgery reassurred re: confusion re: pulse rates

## 2015-08-15 NOTE — Patient Instructions (Signed)
Your EKG and rhythm strips show frequent extra beats.  I dont' believe this is significantly different from when you saw Dr. Harl Bowie and had the Holter monitor done.  I will forward these results to Dr. Harl Bowie, and have him contact the podiatrist if he feels it is safe for him to have the foot procedure.   Premature Beats A premature beat is an extra heartbeat that happens earlier than normal. Premature beats are called premature atrial contractions (PACs) or premature ventricular contractions (PVCs) depending on the area of the heart where they start. CAUSES  Premature beats may be brought on by a variety of factors including:  Emotional stress.  Lack of sleep.  Caffeine.  Asthma medicines.  Stimulants.  Herbal teas.  Dietary supplements.  Alcohol. In most cases, premature beats are not dangerous and are not a sign of serious heart disease. Most patients evaluated for premature beats have completely normal heart function. Rarely, premature beats may be a sign of more significant heart problems or medical illness. SYMPTOMS  Premature beats may cause palpitations. This means you feel like your heart is skipping a beat or beating harder than usual. Sometimes, slight chest pain occurs with premature beats, lasting only a few seconds. This pain has been described as a "flopping" feeling inside the chest. In many cases, premature beats do not cause any symptoms and they are only detected when an electrocardiography test (EKG) or heart monitoring is performed. DIAGNOSIS  Your caregiver may run some tests to evaluate your heart such as an EKG or echocardiography. You may need to wear a portable heart monitor for several days to record the electrical activity of your heart. Blood testing may also be performed to check your electrolytes and thyroid function. TREATMENT  Premature beats usually go away with rest. If the problem continues, your caregiver will determine a treatment plan for you.   HOME CARE INSTRUCTIONS  Get plenty of rest over the next few days until your symptoms improve.  Avoid coffee, tea, alcohol, and soda (pop, cola).  Do not smoke. SEEK MEDICAL CARE IF:  Your symptoms continue after 1 to 2 days of rest.  You have new symptoms, such as chest pain or trouble breathing. SEEK IMMEDIATE MEDICAL CARE IF:  You have severe chest pain or abdominal pain.  You have pain that radiates into the neck, arm, or jaw.  You faint or have extreme weakness.  You have shortness of breath.  Your heartbeat races for more than 5 seconds. MAKE SURE YOU:  Understand these instructions.  Will watch your condition.  Will get help right away if you are not doing well or get worse. Document Released: 12/19/2004 Document Revised: 02/03/2012 Document Reviewed: 07/15/2011 [facility] Patient Information 2015 [place], [place]. This information is not intended to replace advice given to you by your health care [facility]. Make sure you discuss any questions you have with your health care Kniyah Khun.

## 2015-08-15 NOTE — Telephone Encounter (Signed)
Pt's wife called while phones were off for lunch. She told them that pt's surgery was cancelled because he had bad EKG results. I tried to call [name] but was unable to reach her.

## 2015-08-16 NOTE — Addendum Note (Signed)
Addended by: Deforest Hoyles D on: 08/16/2015 01:02 PM   Modules accepted: Orders

## 2015-08-18 ENCOUNTER — Ambulatory Visit (INDEPENDENT_AMBULATORY_CARE_PROVIDER_SITE_OTHER): Payer: Medicare Other | Admitting: Cardiology

## 2015-08-18 ENCOUNTER — Encounter: Payer: Self-pay | Admitting: Cardiology

## 2015-08-18 VITALS — BP 130/68 | HR 85 | Ht 70.0 in | Wt 217.0 lb

## 2015-08-18 DIAGNOSIS — I493 Ventricular premature depolarization: Secondary | ICD-10-CM

## 2015-08-18 DIAGNOSIS — R0602 Shortness of breath: Secondary | ICD-10-CM | POA: Diagnosis not present

## 2015-08-18 MED ORDER — METOPROLOL TARTRATE 25 MG PO TABS: 12.5000 mg | ORAL_TABLET | Freq: Two times a day (BID) | ORAL | Status: DC

## 2015-08-18 NOTE — Progress Notes (Signed)
Patient ID: Christian [name], male   DOB: [birth year], 74 y.o.   MRN: [id number]     Clinical Summary Christian [name] is a 74 y.o.male seen today for follow up of the following medical problems.   1. PVCs - patient seen previously for PVCs. Monitor at that time showed fairly frequenct PVCs, but no runs of NSVT or VT. He denies any palpitations.  - he was seen preop for a foot surgery and the procedure was cancelled due to frequent PVCs on his EKG - reports several months of generalized fatigue. Can feel SOB at times walking to the mailbox, though exertion is also limited by chronic leg pain. This is fatigue and SOB is new since our last visit.     2. PE - 01/2015 CT ches twith large volume PE with evidence of RV strain.  - has been on anticoag since that time. Echo 05/2015 shows resolution of RV strain - anticoag has been managed by his heme/onc physician Past Medical History  Diagnosis Date  . High cholesterol   . Hypertension   . OSA on CPAP   . Insomnia   . Pulmonary embolism 01/2015  . DVT (deep venous thrombosis) 01/2015  . Diabetes     type 2  . Chronic back pain   . Spinal stenosis     lumbar  . Leg pain, diffuse   . Cataract   . Tremor     on propranolol  . Wears glasses   . Hearing impaired   . Mild cognitive impairment     sees [facility]  . Memory loss   . Insomnia   . Numbness     fingers, feet, toes  . Leg swelling   . Confusion   . Agitation   . 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 06/14/2015     Allergies  Allergen Reactions  . Bee Venom   . Oxycodone Other (See Comments)    Mental status changes  . Oxycontin [Oxycodone Hcl] Other (See Comments)    Mental status changes per pt     Current Outpatient Prescriptions  Medication Sig Dispense Refill  . cyclobenzaprine (FLEXERIL) 10 MG tablet Take 1 tablet (10 mg total) by mouth at bedtime. 30 tablet 1  . donepezil (ARICEPT) 10 MG tablet Take 1  tablet (10 mg total) by mouth at bedtime. 30 tablet 3  . EPINEPHrine (EPIPEN 2-PAK) 0.3 mg/0.3 mL IJ SOAJ injection Inject 0.3 mLs (0.3 mg total) into the muscle once. 1 Device 0  . INVOKAMET [id number] MG TABS     . Misc Natural Products (OSTEO BI-FLEX ADV JOINT SHIELD PO) Take 1 tablet by mouth daily.     . rivaroxaban (XARELTO) 20 MG TABS tablet Take 1 tablet (20 mg total) by mouth daily with supper. 90 tablet 3  . traMADol (ULTRAM) 50 MG tablet TAKE (1) TABLET EVERY EIGHT HOURS AS NEEDED FOR PAIN. 60 tablet 0  . TRULICITY 1.5 ZW/2.5EN SOPN      No current facility-administered medications for this visit.     Past Surgical History  Procedure Laterality Date  . Tonsillectomy    . Cataract extraction    . Colonoscopy  2014     Allergies  Allergen Reactions  . Bee Venom   . Oxycodone Other (See Comments)    Mental status changes  . Oxycontin [Oxycodone Hcl] Other (See Comments)    Mental status changes per pt      Family History  Problem Relation Age of Onset  . Dementia Father   . Heart disease Brother   . Clotting disorder Brother   . Psychiatric Illness Sister      Social History Christian [name] reports that he quit smoking about 10 years ago. His smoking use included Cigarettes. He has a 10 pack-year smoking history. He has never used smokeless tobacco. Christian [name] reports that he does not drink alcohol.   Review of Systems CONSTITUTIONAL: No weight loss, fever, chills, weakness or fatigue.  HEENT: Eyes: No visual loss, blurred vision, double vision or yellow sclerae.No hearing loss, sneezing, congestion, runny nose or sore throat.  SKIN: No rash or itching.  CARDIOVASCULAR: per HPI RESPIRATORY: per HPI.  GASTROINTESTINAL: No anorexia, nausea, vomiting or diarrhea. No abdominal pain or blood.  GENITOURINARY: No burning on urination, no polyuria NEUROLOGICAL: No headache, dizziness, syncope, paralysis, ataxia, numbness or tingling in the extremities. No change in bowel  or bladder control.  MUSCULOSKELETAL: No muscle, back pain, joint pain or stiffness.  LYMPHATICS: No enlarged nodes. No history of splenectomy.  PSYCHIATRIC: No history of depression or anxiety.  ENDOCRINOLOGIC: No reports of sweating, cold or heat intolerance. No polyuria or polydipsia.  [name]   Physical Examination Filed Vitals:   08/18/15 1315  BP: 130/68  Pulse: 85   Filed Vitals:   08/18/15 1315  Height: 5\' 10"  (1.778 m)  Weight: 217 lb (98.431 kg)    Gen: resting comfortably, no acute distress HEENT: no scleral icterus, pupils equal round and reactive, no palptable cervical adenopathy,  CV: RRR, no m/r/g, no JVD Resp: Clear to auscultation bilaterally GI: abdomen is soft, non-tender, non-distended, normal bowel sounds, no hepatosplenomegaly MSK: extremities are warm, no edema.  Skin: warm, no rash Neuro:  no focal deficits Psych: appropriate affect   Diagnostic Studies 05/2015 echo Study Conclusions  - Left ventricle: The cavity size was normal. Wall thickness was increased in a pattern of mild LVH. Systolic function was normal. The estimated ejection fraction was in the range of 60% to 65%. Frequent ventricular ectopy prohibts evaluation of diastolic function. Wall motion was normal; there were no regional wall motion abnormalities. - Aortic valve: Mildly calcified annulus. Trileaflet; mildly thickened leaflets. Valve area (VTI): 2.84 cm^2. Valve area (Vmax): 2.63 cm^2. - Mitral valve: Mildly calcified annulus. Mildly thickened leaflets . - Left atrium: The atrium was mildly dilated. - Frequent PVCs are noted during the exam, correlate clinically. - Technicallya adequate study.  06/2015 Holter monitor  Predominant rhythm is normal sinus  Occasional PACs, no sustained arrhythmias  Occasional PVCs in singles and coupletes, no NSVT or sustained ventricular arrhythmias  No diary submitted  No arrhythmias. Supraventricular and ventricular ectopy  without significant arrhythmia.    Assessment and Plan  1. PVCs - previous holter monitor did show somewhat frequent PVCs, no NSVT or VT. Echo at that time did not show any significant structural heart disease - he report some generalized fatigue and SOB since that time. Given these symptoms and his PVCs will obtain Lexiscan to evaluate for possible ischemic source. This will also help risk stratify him for his upcoming surgery - start lopressor 12.5mg . Notes mention previous bradycardia however I suspect this is incorrect measurement by dynamaps missing the PVCs. His heart monitor showed normal heart rates.   2. PE - management per heme/onc - repeat echo shows resolution of right heart strain     F/u 3-4 wees   Arnoldo Lenis, M.D.

## 2015-08-18 NOTE — Patient Instructions (Signed)
Your physician recommends that you schedule a follow-up appointment in: 3-4 weeks Dr Harl Bowie     START lopressor 12.5 mg twice a day     Your physician has requested that you have a lexiscan myoview. For further information please visit [email or web address]. Please follow instruction sheet, as given.      Thank you for choosing [facility] !

## 2015-08-19 ENCOUNTER — Other Ambulatory Visit: Payer: Self-pay | Admitting: Neurology

## 2015-08-22 ENCOUNTER — Encounter: Payer: Self-pay | Admitting: Medical

## 2015-08-22 NOTE — Telephone Encounter (Signed)
I called and LMVM for wife to give me a call back re: the aricept refill.  I see that pt saw CV recently.  Need to speak to the pt or his wife prior to filling.

## 2015-08-23 NOTE — Telephone Encounter (Signed)
aricept filled by Bedelia Person for patient.

## 2015-08-24 ENCOUNTER — Inpatient Hospital Stay (HOSPITAL_COMMUNITY): Admission: RE | Admit: 2015-08-24 | Payer: Medicare Other | Source: Ambulatory Visit

## 2015-08-24 ENCOUNTER — Encounter (HOSPITAL_COMMUNITY)
Admission: RE | Admit: 2015-08-24 | Discharge: 2015-08-24 | Disposition: A | Discharge status: Home / Self Care | Payer: Medicare Other | Source: Ambulatory Visit | Attending: Cardiology | Admitting: Cardiology

## 2015-08-24 ENCOUNTER — Encounter (HOSPITAL_COMMUNITY): Payer: Self-pay

## 2015-08-24 DIAGNOSIS — R0602 Shortness of breath: Secondary | ICD-10-CM | POA: Insufficient documentation

## 2015-08-24 LAB — NM MYOCAR MULTI W/SPECT W/WALL MOTION / EF
CHL CUP NUCLEAR SDS: 1
CHL CUP NUCLEAR SRS: 4
CHL CUP RESTING HR STRESS: 90 {beats}/min
LV dias vol: 98 mL
LV sys vol: 49 mL
Peak HR: 112 {beats}/min
RATE: 0.31
SSS: 4
TID: 1.36

## 2015-08-24 MED ORDER — REGADENOSON 0.4 MG/5ML IV SOLN
INTRAVENOUS | Status: AC
  Administered 2015-08-24: 0.4 mg via INTRAVENOUS
  Filled 2015-08-24: qty 5

## 2015-08-24 MED ORDER — SODIUM CHLORIDE 0.9 % IJ SOLN
INTRAMUSCULAR | Status: AC
  Administered 2015-08-24: 10 mL via INTRAVENOUS
  Filled 2015-08-24: qty 3

## 2015-08-24 MED ORDER — TECHNETIUM TC 99M SESTAMIBI GENERIC - CARDIOLITE
30.0000 | Freq: Once | INTRAVENOUS | Status: AC | PRN
Start: 2015-08-24 — End: 2015-08-24
  Administered 2015-08-24: 29 via INTRAVENOUS

## 2015-08-24 MED ORDER — TECHNETIUM TC 99M SESTAMIBI - CARDIOLITE
10.0000 | Freq: Once | INTRAVENOUS | Status: AC | PRN
Start: 2015-08-24 — End: 2015-08-24
  Administered 2015-08-24: 10:00:00 9.4 via INTRAVENOUS

## 2015-08-25 ENCOUNTER — Telehealth: Payer: Self-pay | Admitting: Medical

## 2015-08-25 NOTE — Telephone Encounter (Signed)
Called pt to schedule a CPE, he did have a diabetic check in July, but has not had a CPE in a while, left message to call back.

## 2015-08-29 ENCOUNTER — Telehealth: Payer: Self-pay | Admitting: Cardiology

## 2015-08-29 NOTE — Telephone Encounter (Signed)
Called PT, spoke to his wife gave her test results from stress test. Let her know that Dr. Harl Bowie wants to see him sooner than Oct. 25. She rescheduled his appointment TO Oct. 14 @ 11:20. She did not want to change his medication at this time due to the fact that he already stays so tired all the time. That she would rather wait until his appointment to discuss it further.

## 2015-08-29 NOTE — Telephone Encounter (Signed)
pls call pt concerning test results

## 2015-08-31 MED ORDER — DILTIAZEM HCL 30 MG PO TABS: 30.0000 mg | ORAL_TABLET | Freq: Two times a day (BID) | ORAL | Status: DC

## 2015-08-31 NOTE — Telephone Encounter (Signed)
Spoke with wife,she agrees to stop metoprolol and begin diltiazem 30 mg BID,will e-scribe to her pharmacy and reminded her of f/u apt at 09/08/15 at 11:20

## 2015-08-31 NOTE — Telephone Encounter (Signed)
-----   Message from Arnoldo Lenis, MD sent at 08/30/2015  2:37 PM EDT ----- The metoprolol he is on may be causing some of his fatigue. Lets stop his metoprolol and start diltiazem short acting 30mg  bid. I'd like to see if this makes a difference before our appointment.   Zandra Abts MD ----- Message -----    From: Drema Dallas, CMA    Sent: 08/29/2015  11:12 AM      To: Arnoldo Lenis, MD  FYI: Spoke to Author's wife about his stress test results and she voiced understanding. But, she says he is still not doing well at all. He has no energy to even get out of bed, that he did not even dress himself yesterday. I let her know that you may up his dose of metoprolol due to his high heart rate- BUT she said she don't think it will be a good idea to change his dose without discussing it further @ his appointment. His appointment was moved from 10/25 to 10/14 per your request. Just wanted to catch you up on this patient & his wife's concerns!

## 2015-09-08 ENCOUNTER — Ambulatory Visit (INDEPENDENT_AMBULATORY_CARE_PROVIDER_SITE_OTHER): Payer: Medicare Other | Admitting: Cardiology

## 2015-09-08 ENCOUNTER — Ambulatory Visit: Payer: Medicare Other | Admitting: Cardiology

## 2015-09-08 ENCOUNTER — Telehealth: Payer: Self-pay | Admitting: Medical

## 2015-09-08 ENCOUNTER — Encounter: Payer: Self-pay | Admitting: Cardiology

## 2015-09-08 VITALS — BP 110/68 | HR 59 | Ht 70.5 in | Wt 214.6 lb

## 2015-09-08 DIAGNOSIS — I493 Ventricular premature depolarization: Secondary | ICD-10-CM | POA: Diagnosis not present

## 2015-09-08 DIAGNOSIS — Z23 Encounter for immunization: Secondary | ICD-10-CM | POA: Diagnosis not present

## 2015-09-08 DIAGNOSIS — I10 Essential (primary) hypertension: Secondary | ICD-10-CM | POA: Diagnosis not present

## 2015-09-08 NOTE — Telephone Encounter (Signed)
Pt called and left message with the answering service and i called him back, pt needed to be set up with a homecare supplies company, Dr branch gave him a [phone] number, they are called [facility] Pt needs a new strap for his cpap machine, pt had a sleep study 06/29/2015. Pt can be reached at [phone]

## 2015-09-08 NOTE — Progress Notes (Signed)
Patient ID: GAINES CARTMELL, male   DOB: 23-Jun-1941, 74 y.o.   MRN: [id number]     Clinical Summary Mr. Barnette is a 74 y.o.male seen today for follow up of the following medical problems.   1. PVCs - patient seen previously for PVCs. Monitor at that time showed fairly frequenct PVCs, but no runs of NSVT or VT. He denies any palpitations.  - he was seen preop for a foot surgery and the procedure was cancelled due to frequent PVCs on his EKG - reports several months of generalized fatigue. Can feel SOB at times walking to the mailbox, though exertion is also limited by chronic leg pain. This is fatigue and SOB is new since our last visit.   - since last visit completed lexiscan MPI that did not show any ischemia.  - we started lopressor 12.5mg  bid last visit as well for PVCs. Still with frequent PVCs on EKG monitoring during stress test. Lopressor was changed to dilt 30mg  bid due to ongoing fatigue, no change in symptoms with med change.    2. PE - 01/2015 CT ches twith large volume PE with evidence of RV strain.  - has been on anticoag since that time. Echo 05/2015 shows resolution of RV strain - anticoag has been managed by his heme/onc physician Past Medical History  Diagnosis Date  . High cholesterol   . Hypertension   . OSA on CPAP   . Insomnia   . Pulmonary embolism 01/2015  . DVT (deep venous thrombosis) 01/2015  . Diabetes     type 2  . Chronic back pain   . Spinal stenosis     lumbar  . Leg pain, diffuse   . Cataract   . Tremor     on propranolol  . Wears glasses   . Hearing impaired   . Mild cognitive impairment     sees [facility]  . Memory loss   . Insomnia   . Numbness     fingers, feet, toes  . Leg swelling   . Confusion   . Agitation   . 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 06/14/2015     Allergies  Allergen Reactions  . Bee Venom   . Oxycodone Other (See Comments)    Mental status changes    . Oxycontin [Oxycodone Hcl] Other (See Comments)    Mental status changes per pt     Current Outpatient Prescriptions  Medication Sig Dispense Refill  . cyclobenzaprine (FLEXERIL) 10 MG tablet Take 1 tablet (10 mg total) by mouth at bedtime. 30 tablet 1  . diltiazem (CARDIZEM) 30 MG tablet Take 1 tablet (30 mg total) by mouth 2 (two) times daily. 60 tablet 6  . donepezil (ARICEPT) 10 MG tablet TAKE ONE TABLET AT BEDTIME. 30 tablet 0  . EPINEPHrine (EPIPEN 2-PAK) 0.3 mg/0.3 mL IJ SOAJ injection Inject 0.3 mLs (0.3 mg total) into the muscle once. 1 Device 0  . INVOKAMET [id number] MG TABS     . Misc Natural Products (OSTEO BI-FLEX ADV JOINT SHIELD PO) Take 1 tablet by mouth daily.     . rivaroxaban (XARELTO) 20 MG TABS tablet Take 1 tablet (20 mg total) by mouth daily with supper. 90 tablet 3  . traMADol (ULTRAM) 50 MG tablet TAKE (1) TABLET EVERY EIGHT HOURS AS NEEDED FOR PAIN. 60 tablet 0  . TRULICITY 1.5 MA/2.6JF SOPN      No current facility-administered medications for this visit.  Past Surgical History  Procedure Laterality Date  . Tonsillectomy    . Cataract extraction    . Colonoscopy  2014     Allergies  Allergen Reactions  . Bee Venom   . Oxycodone Other (See Comments)    Mental status changes  . Oxycontin [Oxycodone Hcl] Other (See Comments)    Mental status changes per pt      Family History  Problem Relation Age of Onset  . Dementia Father   . Heart disease Brother   . Clotting disorder Brother   . Psychiatric Illness Sister      Social History Mr. Ries reports that he quit smoking about 10 years ago. His smoking use included Cigarettes. He has a 10 pack-year smoking history. He has never used smokeless tobacco. Mr. Criger reports that he does not drink alcohol.   Review of Systems CONSTITUTIONAL: + fatigue  HEENT: Eyes: No visual loss, blurred vision, double vision or yellow sclerae.No hearing loss, sneezing, congestion, runny nose or sore  throat.  SKIN: No rash or itching.  CARDIOVASCULAR: no chest pain, no palpitations RESPIRATORY: No shortness of breath, cough or sputum.  GASTROINTESTINAL: No anorexia, nausea, vomiting or diarrhea. No abdominal pain or blood.  GENITOURINARY: No burning on urination, no polyuria NEUROLOGICAL: No headache, dizziness, syncope, paralysis, ataxia, numbness or tingling in the extremities. No change in bowel or bladder control.  MUSCULOSKELETAL: No muscle, back pain, joint pain or stiffness.  LYMPHATICS: No enlarged nodes. No history of splenectomy.  PSYCHIATRIC: No history of depression or anxiety.  ENDOCRINOLOGIC: No reports of sweating, cold or heat intolerance. No polyuria or polydipsia.  [name]   Physical Examination Filed Vitals:   09/08/15 1109  BP: 110/68  Pulse: 59   Filed Vitals:   09/08/15 1109  Height: 5' 10.5" (1.791 m)  Weight: 214 lb 9.6 oz (97.342 kg)    Gen: resting comfortably, no acute distress HEENT: no scleral icterus, pupils equal round and reactive, no palptable cervical adenopathy,  CV: RRR, no m/r/g, no jvd Resp: Clear to auscultation bilaterally GI: abdomen is soft, non-tender, non-distended, normal bowel sounds, no hepatosplenomegaly MSK: extremities are warm, no edema.  Skin: warm, no rash Neuro:  no focal deficits Psych: appropriate affect   Diagnostic Studies 05/2015 echo Study Conclusions  - Left ventricle: The cavity size was normal. Wall thickness was increased in a pattern of mild LVH. Systolic function was normal. The estimated ejection fraction was in the range of 60% to 65%. Frequent ventricular ectopy prohibts evaluation of diastolic function. Wall motion was normal; there were no regional wall motion abnormalities. - Aortic valve: Mildly calcified annulus. Trileaflet; mildly thickened leaflets. Valve area (VTI): 2.84 cm^2. Valve area (Vmax): 2.63 cm^2. - Mitral valve: Mildly calcified annulus. Mildly thickened leaflets . -  Left atrium: The atrium was mildly dilated. - Frequent PVCs are noted during the exam, correlate clinically. - Technicallya adequate study.  06/2015 Holter monitor  Predominant rhythm is normal sinus  Occasional PACs, no sustained arrhythmias  Occasional PVCs in singles and coupletes, no NSVT or sustained ventricular arrhythmias  No diary submitted  No arrhythmias. Supraventricular and ventricular ectopy without significant arrhythmia.  07/2015 Lexiscan  There was no ST segment deviation noted during stress after Lexiscan injection  The study is normal. There are no defect consistent with ischemia or infarct  This is a low risk study.  Nuclear stress EF: 50%.  Assessment and Plan  1. PVCs - previous holter monitor did show somewhat frequent PVCs, no NSVT  or VT. Echo at that time did not show any significant structural heart disease. Lexiscan is negative for ischemia - continued nonspecific fatigue, unclear if PVCs are related Will repeat monitor now that he is on CCB to reassess his PVC burden. Pending results likely clear for foot surgery  F/u 6 months    Arnoldo Lenis, M.D.

## 2015-09-08 NOTE — Patient Instructions (Signed)
Your physician wants you to follow-up in: 23  Moths with Dr Bryna Colander will receive a reminder letter in the mail two months in advance. If you don't receive a letter, please call our office to schedule the follow-up appointment.    Your physician has recommended that you wear a holter monitor for 48 hrs. Holter monitors are medical devices that record the heart's electrical activity. Doctors most often use these monitors to diagnose arrhythmias. Arrhythmias are problems with the speed or rhythm of the heartbeat. The monitor is a small, portable device. You can wear one while you do your normal daily activities. This is usually used to diagnose what is causing palpitations/syncope (passing out).    Your physician recommends that you continue on your current medications as directed. Please refer to the Current Medication list given to you today.     Thank you for choosing [facility] !

## 2015-09-11 ENCOUNTER — Telehealth: Payer: Self-pay | Admitting: Medical

## 2015-09-11 ENCOUNTER — Encounter (HOSPITAL_BASED_OUTPATIENT_CLINIC_OR_DEPARTMENT_OTHER): Payer: Medicare Other

## 2015-09-11 NOTE — Telephone Encounter (Signed)
Dr. Thurman Coyer office called & states that pt was seen 4/16 & now states he doesn't want to go back there.  They just wanted to let you know

## 2015-09-11 NOTE — Telephone Encounter (Signed)
[facility] will not accept pt for a new strap for his cpap since pt did not get his cpap from them. Called and spoke with wife of pt about this and asked who Christian [name] (patient) was getting the supplies and cpap from and she said that she did not know that he has had it for a while and that the company just dropped them and i asked who the company was so i could call and try to find a place that pt can get a strap from and she said she didn't know who the company was and i told her i could not move forward with this until i found out the name of who he was previous getting equipment from and she said she would have to check with pt and get back to [place]. Once i get a call back i can move forward on this.

## 2015-09-11 NOTE — Telephone Encounter (Signed)
Spoke to patient and he said he used [facility] @ [phone] and needs a strap/mask as the plastic park is broke on it and the bottom strap is cutting his neck. I called [facility] and they dropped the patient because of his insurance being [employer]. Pt has called his insurance and was given 4 names to call to check and see if he can get cpap mask supplies.   [facility]- (pt states that they are wanting his sleep study from a couple months ago)  [facility] [place]  [place]- [phone]   I will call about this tomorrow 09/11/15 as it is after 5:00pm today

## 2015-09-12 ENCOUNTER — Ambulatory Visit (HOSPITAL_COMMUNITY)
Admission: RE | Admit: 2015-09-12 | Discharge: 2015-09-12 | Disposition: A | Discharge status: Home / Self Care | Payer: Medicare Other | Source: Ambulatory Visit | Attending: Cardiology | Admitting: Cardiology

## 2015-09-12 DIAGNOSIS — I493 Ventricular premature depolarization: Secondary | ICD-10-CM | POA: Diagnosis not present

## 2015-09-12 NOTE — Telephone Encounter (Signed)
Spoke to [facility] again and they need a recent office visit sent patient was last seen about his cpap back in July. I have called [facility] and they asked for me to fax over ov notes, sleep study, order so they can look over it and see if they can get a mask for pt. I have Faxed everything over to them @ [phone]. Also i called the patient to inform him that everything was sent to [facility] and if anymore questions they would call me or call him to get additional info.

## 2015-09-13 ENCOUNTER — Other Ambulatory Visit (HOSPITAL_COMMUNITY): Payer: Self-pay | Admitting: Hematology & Oncology

## 2015-09-14 ENCOUNTER — Encounter (HOSPITAL_COMMUNITY): Payer: Medicare Other | Attending: Hematology & Oncology | Admitting: Hematology & Oncology

## 2015-09-14 ENCOUNTER — Encounter (HOSPITAL_COMMUNITY): Payer: Self-pay | Admitting: Hematology & Oncology

## 2015-09-14 ENCOUNTER — Encounter (HOSPITAL_BASED_OUTPATIENT_CLINIC_OR_DEPARTMENT_OTHER): Payer: Medicare Other

## 2015-09-14 ENCOUNTER — Other Ambulatory Visit: Payer: Self-pay

## 2015-09-14 ENCOUNTER — Telehealth: Payer: Self-pay

## 2015-09-14 VITALS — BP 121/54 | HR 38 | Temp 98.4°F | Resp 15 | Wt 214.2 lb

## 2015-09-14 DIAGNOSIS — Z86718 Personal history of other venous thrombosis and embolism: Secondary | ICD-10-CM

## 2015-09-14 DIAGNOSIS — R001 Bradycardia, unspecified: Secondary | ICD-10-CM | POA: Insufficient documentation

## 2015-09-14 DIAGNOSIS — I2699 Other pulmonary embolism without acute cor pulmonale: Secondary | ICD-10-CM

## 2015-09-14 DIAGNOSIS — D688 Other specified coagulation defects: Secondary | ICD-10-CM | POA: Diagnosis not present

## 2015-09-14 DIAGNOSIS — D6852 Prothrombin gene mutation: Secondary | ICD-10-CM

## 2015-09-14 LAB — CBC WITH DIFFERENTIAL/PLATELET
BASOS PCT: 1 %
Basophils Absolute: 0.1 10*3/uL (ref 0.0–0.1)
EOS ABS: 0.1 10*3/uL (ref 0.0–0.7)
Eosinophils Relative: 1 %
HEMATOCRIT: 45.5 % (ref 39.0–52.0)
Hemoglobin: 15.2 g/dL (ref 13.0–17.0)
Lymphocytes Relative: 14 %
Lymphs Abs: 1.1 10*3/uL (ref 0.7–4.0)
MCH: 30 pg (ref 26.0–34.0)
MCHC: 33.4 g/dL (ref 30.0–36.0)
MCV: 89.9 fL (ref 78.0–100.0)
MONO ABS: 0.6 10*3/uL (ref 0.1–1.0)
MONOS PCT: 8 %
Neutro Abs: 5.8 10*3/uL (ref 1.7–7.7)
Neutrophils Relative %: 76 %
Platelets: 248 10*3/uL (ref 150–400)
RBC: 5.06 MIL/uL (ref 4.22–5.81)
RDW: 12.5 % (ref 11.5–15.5)
WBC: 7.7 10*3/uL (ref 4.0–10.5)

## 2015-09-14 LAB — FERRITIN: FERRITIN: 41 ng/mL (ref 24–336)

## 2015-09-14 NOTE — Telephone Encounter (Signed)
Patient had reported HR of 38 apical. BP 120/54.Pt only complaint was he felt bad all over.EKG obtained ,bigeminy noted.,confimed by Arnold Long NP . Dr.koneswaran made aware   Dr Whitney Muse called and stated she was changing naprosyn to celebrex and sending pt home

## 2015-09-14 NOTE — Patient Instructions (Signed)
[place] at [facility] Discharge Instructions  RECOMMENDATIONS MADE BY THE CONSULTANT AND ANY TEST RESULTS WILL BE SENT TO YOUR REFERRING PHYSICIAN.  Exam and discussion by Dr. Whitney Muse. Per cardiology - they are not concerned about your EKG - not changed very much. Cardiologist will call Dr. Whitney Muse to discuss the celebrex.  If they approve it we will prescribe it and call you.  Follow-up in 4 months.  Thank you for choosing [place] at [facility] to provide your oncology and hematology care.  To afford each patient quality time with our provider, please arrive at least 15 minutes before your scheduled appointment time.    You need to re-schedule your appointment should you arrive 10 or more minutes late.  We strive to give you quality time with our providers, and arriving late affects you and other patients whose appointments are after yours.  Also, if you no show three or more times for appointments you may be dismissed from the clinic at the providers discretion.     Again, thank you for choosing [facility].  Our hope is that these requests will decrease the amount of time that you wait before being seen by our physicians.       _____________________________________________________________  Should you have questions after your visit to [facility], please contact our office at [phone] between the hours of 8:30 a.m. and 4:30 p.m.  Voicemails left after 4:30 p.m. will not be returned until the following business day.  For prescription refill requests, have your pharmacy contact our office.
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
## 2015-09-15 ENCOUNTER — Telehealth: Payer: Self-pay | Admitting: Medical

## 2015-09-15 NOTE — Telephone Encounter (Signed)
Called pt & wife back to advise need to follow up with Dr. Whitney Muse on Monday, had to leave a message

## 2015-09-15 NOTE — Telephone Encounter (Signed)
Pharmacy called and stated that pt was there and stated that dr penland was suppose to talk to shane and see if shane would prescribe celebrex, and thought it would be at the pharmacy and it wasn't.  Do you want to call it in???

## 2015-09-15 NOTE — Progress Notes (Signed)
LABS DRAWN

## 2015-09-15 NOTE — Telephone Encounter (Signed)
Let him know that we have to be careful with that medication especially with his XARELTO. Have him use Tylenol for now and call back on Monday to talk to [facility]

## 2015-09-15 NOTE — Telephone Encounter (Signed)
Per notes Dr. Whitney Muse says in her telephone call that she was going to prescribe Celebrex, so informed pharmacy that they needed to contact Dr. Whitney Muse for approval.  They have already left for the day so it will be Monday.

## 2015-09-19 ENCOUNTER — Ambulatory Visit: Payer: Medicare Other | Admitting: Cardiology

## 2015-09-19 ENCOUNTER — Other Ambulatory Visit (HOSPITAL_COMMUNITY): Payer: Self-pay | Admitting: Hematology & Oncology

## 2015-09-19 NOTE — Progress Notes (Unsigned)
At our last visit Christian [name] complained of significant limiting joint pain. He admitted to taking multiple aleve every day as this was the only thing that helped his pain.  He is on Xarelto and I discussed my concerns about increased bleeding risk with the patient. He says he did not care at this point because something had to be done about his joint pain.  I have discussed this with Dr. Jacinta Shoe and he concurs that although there is a cardiac risk, celebrex would be a better/safer option for the patient than his use of aleve.   I discussed this with the patient.  Script will be called in. Donald Pore MD

## 2015-09-19 NOTE — Progress Notes (Signed)
pts wife wanted to know about celebrex, Dr Whitney Muse talked with cardiology.  OK to give celebrex, called into [facility]

## 2015-09-22 ENCOUNTER — Telehealth: Payer: Self-pay

## 2015-09-22 DIAGNOSIS — I493 Ventricular premature depolarization: Secondary | ICD-10-CM

## 2015-09-22 NOTE — Telephone Encounter (Signed)
Lm for pt to call back,ref placed to EP in [place]

## 2015-09-22 NOTE — Telephone Encounter (Signed)
Wife called back,will call [place] office to schedule EP consult

## 2015-09-22 NOTE — Telephone Encounter (Signed)
-----   Message from Arnoldo Lenis, MD sent at 09/22/2015  1:13 PM EDT ----- Monitor continues to show frequent PVCs that may be causing his fatigue. Please refer him to EP,await there eval before considering his leg surgey  Zandra Abts MD

## 2015-09-24 ENCOUNTER — Other Ambulatory Visit: Payer: Self-pay | Admitting: Neurology

## 2015-09-27 ENCOUNTER — Encounter (HOSPITAL_COMMUNITY): Payer: Medicare Other

## 2015-09-30 ENCOUNTER — Other Ambulatory Visit: Payer: Self-pay | Admitting: Medical

## 2015-10-04 ENCOUNTER — Other Ambulatory Visit: Payer: Self-pay | Admitting: Medical

## 2015-10-04 ENCOUNTER — Telehealth (HOSPITAL_COMMUNITY): Payer: Self-pay | Admitting: *Deleted

## 2015-10-04 NOTE — Telephone Encounter (Signed)
Called concerning apt date and time  Casey Cockerham, LPTA; CBIS [phone]  

## 2015-10-04 NOTE — Telephone Encounter (Signed)
Left message on voicemail in reference to upcoming appointment scheduled for 10/06/15. Phone number given for a call back so details instructions can be given. Hubbard Robinson, RN

## 2015-10-05 ENCOUNTER — Telehealth: Payer: Self-pay

## 2015-10-05 ENCOUNTER — Other Ambulatory Visit (HOSPITAL_COMMUNITY): Payer: Self-pay

## 2015-10-05 ENCOUNTER — Telehealth (HOSPITAL_COMMUNITY): Payer: Self-pay | Admitting: *Deleted

## 2015-10-05 DIAGNOSIS — D509 Iron deficiency anemia, unspecified: Secondary | ICD-10-CM

## 2015-10-05 MED ORDER — POLYSACCHARIDE IRON COMPLEX 150 MG PO CAPS: 150.0000 mg | ORAL_CAPSULE | Freq: Every day | ORAL | Status: DC

## 2015-10-05 NOTE — Telephone Encounter (Signed)
Christian [name] for pt to [facility]

## 2015-10-05 NOTE — Telephone Encounter (Signed)
Refill request for Tramadol 50mg  #60

## 2015-10-05 NOTE — Telephone Encounter (Signed)
Needs appt

## 2015-10-05 NOTE — Telephone Encounter (Signed)
LM on wifes vm that pt does not need repeat Nuc stress test as his one that was done  in Sept was low risk.Pt only needs to see Dr Lovena Le on 10/10/15 in consult for frequent PVC's

## 2015-10-05 NOTE — Telephone Encounter (Signed)
Is this ok to refill?  

## 2015-10-05 NOTE — Telephone Encounter (Signed)
Left message on voicemail in reference to upcoming appointment scheduled for 10/06/15. Phone number given for a call back so details instructions can be given. Conal Shetley, Ranae Palms at [phone]  Patient given detailed instructions per Myocardial Perfusion Study Information Sheet for the test on 10/06/15 at 1100. Patient notified to arrive 15 minutes early and that it is imperative to arrive on time for appointment to keep from having the test rescheduled.  If you need to cancel or reschedule your appointment, please call the office within 24 hours of your appointment. Failure to do so may result in a cancellation of your appointment, and a $50 no show fee. Patient verbalized understanding.Patient was very disguted in follow up with offices and unable to reach Drs office except for recording. Patient and wife was very upset for [place] not discussing his instructions with wife. There is no DPR on file in epic. I explained this several times. Patient also didnot understand why he needed this test every 2 weeks. I discussed with patient that this is not the usual. I follow up with this after phone call.I found patient had this test done 08/21/15. I notified Dr. Zandra Abts office and his nurse stated that this test did not need to be repeated. She stated she would notify patient.Anothony Bursch, [place]e [place]ms

## 2015-10-06 ENCOUNTER — Encounter (HOSPITAL_COMMUNITY): Payer: Medicare Other

## 2015-10-06 ENCOUNTER — Other Ambulatory Visit: Payer: Self-pay

## 2015-10-06 ENCOUNTER — Telehealth: Payer: Self-pay | Admitting: Medical

## 2015-10-06 MED ORDER — TRAMADOL HCL 50 MG PO TABS: ORAL_TABLET | ORAL | Status: DC

## 2015-10-06 NOTE — Telephone Encounter (Signed)
Tramadol called in until he can be rechecked.

## 2015-10-06 NOTE — Telephone Encounter (Signed)
Pt called and wanted to know why tramadol was denied. Pt was informed that per shane he needed an appt. Pt states that he doesn't think it is necessary but will make one if he has too. Pt was informed that shane is out of this office today. Pt states he is completely out of his meds. He would like tramadol refilled. Pt to stop by and will be told he needs to make an appt.

## 2015-10-06 NOTE — Telephone Encounter (Signed)
Called in tramadol per jcl 

## 2015-10-06 NOTE — Telephone Encounter (Signed)
He is supposed to stop by today. We will try to set him up with an appt while they are here.

## 2015-10-10 ENCOUNTER — Encounter (HOSPITAL_COMMUNITY): Payer: Self-pay

## 2015-10-10 ENCOUNTER — Telehealth: Payer: Self-pay | Admitting: Internal Medicine

## 2015-10-10 ENCOUNTER — Encounter: Payer: Self-pay | Admitting: Internal Medicine

## 2015-10-10 ENCOUNTER — Ambulatory Visit (INDEPENDENT_AMBULATORY_CARE_PROVIDER_SITE_OTHER): Payer: Medicare Other | Admitting: Internal Medicine

## 2015-10-10 VITALS — BP 118/58 | HR 72 | Ht 71.0 in | Wt 211.4 lb

## 2015-10-10 DIAGNOSIS — I493 Ventricular premature depolarization: Secondary | ICD-10-CM | POA: Diagnosis not present

## 2015-10-10 DIAGNOSIS — R001 Bradycardia, unspecified: Secondary | ICD-10-CM

## 2015-10-10 DIAGNOSIS — I2692 Saddle embolus of pulmonary artery without acute cor pulmonale: Secondary | ICD-10-CM

## 2015-10-10 DIAGNOSIS — I10 Essential (primary) hypertension: Secondary | ICD-10-CM | POA: Diagnosis not present

## 2015-10-10 DIAGNOSIS — I2782 Chronic pulmonary embolism: Secondary | ICD-10-CM

## 2015-10-10 MED ORDER — FLECAINIDE ACETATE 50 MG PO TABS: 75.0000 mg | ORAL_TABLET | Freq: Two times a day (BID) | ORAL | Status: DC

## 2015-10-10 NOTE — Telephone Encounter (Signed)
New problem   Pt stated he asked for evening appt and was sched at 8:30 am. Pt wish to speak to nurse to be worked in later in evening. Please advise.

## 2015-10-10 NOTE — Assessment & Plan Note (Signed)
He will continue xarelto. His RV function by echo is normal.

## 2015-10-10 NOTE — Patient Instructions (Addendum)
Medication Instructions:  Your physician has recommended you make the following change in your medication:  1) START Flecainide 75 mg twice a day  Labwork: None ordered  Testing/Procedures: None ordered  Follow-Up: Your physician recommends that you schedule a follow-up appointment in: one week for EKG, nurse visit.  Your physician recommends that you schedule a follow-up appointment in: 3-4 weeks with Dr. Lovena Le.   If you need a refill on your cardiac medications before your next appointment, please call your pharmacy.  Thank you for choosing [facility]!!     Flecainide tablets What is this medicine? FLECAINIDE (FLEK a nide) is an antiarrhythmic drug. This medicine is used to prevent irregular heart rhythm. It can also slow down fast heartbeats called tachycardia. This medicine may be used for other purposes; ask your health care provider or pharmacist if you have questions. What should I tell my health care provider before I take this medicine? They need to know if you have any of these conditions: -abnormal levels of potassium in the blood -heart disease including heart rhythm and heart rate problems -kidney or liver disease -recent heart attack -an unusual or allergic reaction to flecainide, local anesthetics, other medicines, foods, dyes, or preservatives -pregnant or trying to get pregnant -breast-feeding How should I use this medicine? Take this medicine by mouth with a glass of water. Follow the directions on the prescription label. You can take this medicine with or without food. Take your doses at regular intervals. Do not take your medicine more often than directed. Do not stop taking this medicine suddenly. This may cause serious, heart-related side effects. If your doctor wants you to stop the medicine, the dose may be slowly lowered over time to avoid any side effects. Talk to your pediatrician regarding the use of this medicine in children. While this drug may  be prescribed for children as young as 1 year of age for selected conditions, precautions do apply. Overdosage: If you think you have taken too much of this medicine contact a poison control center or emergency room at once. NOTE: This medicine is only for you. Do not share this medicine with others. What if I miss a dose? If you miss a dose, take it as soon as you can. If it is almost time for your next dose, take only that dose. Do not take double or extra doses. What may interact with this medicine? Do not take this medicine with any of the following medications: -amoxapine -arsenic trioxide -certain antibiotics like clarithromycin, erythromycin, gatifloxacin, gemifloxacin, levofloxacin, moxifloxacin, sparfloxacin, or troleandomycin -certain antidepressants called tricyclic antidepressants like amitriptyline, imipramine, or nortriptyline -certain medicines to control heart rhythm like disopyramide, dofetilide, encainide, moricizine, procainamide, propafenone, and quinidine -cisapride -cyclobenzaprine -delavirdine -droperidol -haloperidol -hawthorn -imatinib -levomethadyl -maprotiline -medicines for malaria like chloroquine and halofantrine -pentamidine -phenothiazines like chlorpromazine, mesoridazine, prochlorperazine, thioridazine -pimozide -quinine -ranolazine -ritonavir -sertindole -ziprasidone This medicine may also interact with the following medications: -cimetidine -medicines for angina or high blood pressure -medicines to control heart rhythm like amiodarone and digoxin This list may not describe all possible interactions. Give your health care provider a list of all the medicines, herbs, non-prescription drugs, or dietary supplements you use. Also tell them if you smoke, drink alcohol, or use illegal drugs. Some items may interact with your medicine. What should I watch for while using this medicine? Visit your doctor or health care professional for regular checks on  your progress. Because your condition and the use of this medicine carries some risk,  it is a good idea to carry an identification card, necklace or bracelet with details of your condition, medications and doctor or health care professional. Check your blood pressure and pulse rate regularly. Ask your health care professional what your blood pressure and pulse rate should be, and when you should contact him or her. Your doctor or health care professional also may schedule regular blood tests and electrocardiograms to check your progress. You may get drowsy or dizzy. Do not drive, use machinery, or do anything that needs mental alertness until you know how this medicine affects you. Do not stand or sit up quickly, especially if you are an older patient. This reduces the risk of dizzy or fainting spells. Alcohol can make you more dizzy, increase flushing and rapid heartbeats. Avoid alcoholic drinks. What side effects may I notice from receiving this medicine? Side effects that you should report to your doctor or health care professional as soon as possible: -chest pain, continued irregular heartbeats -difficulty breathing -swelling of the legs or feet -trembling, shaking -unusually weak or tired Side effects that usually do not require medical attention (report to your doctor or health care professional if they continue or are bothersome): -blurred vision -constipation -headache -nausea, vomiting -stomach pain This list may not describe all possible side effects. Call your doctor for medical advice about side effects. You may report side effects to FDA at 1-800-FDA-1088. Where should I keep my medicine? Keep out of the reach of children. Store at room temperature between 15 and 30 degrees C (59 and 86 degrees F). Protect from light. Keep container tightly closed. Throw away any unused medicine after the expiration date. NOTE: This sheet is a summary. It may not cover all possible information. If  you have questions about this medicine, talk to your doctor, pharmacist, or health care provider.    2016, Elsevier/Gold Standard. (2008-03-16 16:46:09)

## 2015-10-10 NOTE — Telephone Encounter (Signed)
Left message on voicemail that pt was given the appointment time that was available.  Will forward this phone note to Derrek Gu to see where the pt can be rescheduled to.
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
## 2015-10-10 NOTE — Assessment & Plan Note (Signed)
His blood pressure is well controlled today. He will continue his current meds. 

## 2015-10-10 NOTE — Assessment & Plan Note (Signed)
While he does not experience palpitations, the density of his PVC"s could be in part related to his fatigue and weakness. I have recommended a trial of flecainide. He cannot walk enough for treadmill testing. We will have him return to see me in several weeks.

## 2015-10-10 NOTE — Telephone Encounter (Signed)
Follow Up ° °Pt returned call//  °

## 2015-10-10 NOTE — Assessment & Plan Note (Signed)
This appears to have resolved. He is no longer on a beta blocker. He does have some conduction system disease so will need to watch him carefully for bradycardia that is symptomatic.

## 2015-10-11 NOTE — Telephone Encounter (Signed)
Lenna Sciara is calling patient to reschedule if time open

## 2015-10-18 ENCOUNTER — Ambulatory Visit (INDEPENDENT_AMBULATORY_CARE_PROVIDER_SITE_OTHER): Payer: Medicare Other | Admitting: *Deleted

## 2015-10-18 VITALS — BP 118/76 | HR 81 | Ht 71.0 in | Wt 211.8 lb

## 2015-10-18 DIAGNOSIS — R001 Bradycardia, unspecified: Secondary | ICD-10-CM | POA: Diagnosis not present

## 2015-10-18 DIAGNOSIS — I10 Essential (primary) hypertension: Secondary | ICD-10-CM

## 2015-10-18 DIAGNOSIS — I493 Ventricular premature depolarization: Secondary | ICD-10-CM | POA: Diagnosis not present

## 2015-10-18 NOTE — Patient Instructions (Signed)
Continue same dose of Flecainide 50 mg 1/2 tablet twice a day.  If you want to change pill to a bigger size can do (1) take 50 mg three times a day or (2) new Rx of 150 mg 1/2 tablet twice a day.  Appointment with Dr. Lovena Le December 13 at 4:00 at [place] office.

## 2015-10-18 NOTE — Progress Notes (Signed)
1.) Reason for visit: EKG 1 week after starting on Flecainide   2.) Name of MD requesting visit: Dr. Crissie Sickles  3.) H&P: Hx of PVC; bradycardia; hypertension  4.) ROS related to problem: States he has not had any side effects from starting on Flecainide.  States he occasionally feels heart being irregular. BP today 118/76  HR 81. EKG performed showing NSR no bigeminy. States he is having to cut pill in half  with a pill cutter because it is so small.  5.) Assessment and plan per MD:  Reviewed EKG with Dr. Lovena Le who advises to stay on same dose.  Per Dr. Lovena Le if pt prefers he can  take the 50 mg of Flecainide TID or take 150 mg 1/2 tab BID.  Pt states he will stay with the 50 mg 1/2 tab BID.  Advised if decides to change to let us know and will send new Rx into pharmacy.  He has an appointment in [place] with Dr. Lovena Le on 12/13 at 4:00

## 2015-10-26 ENCOUNTER — Other Ambulatory Visit: Payer: Self-pay | Admitting: Family Medicine

## 2015-10-26 ENCOUNTER — Telehealth: Payer: Self-pay | Admitting: Medical

## 2015-10-26 NOTE — Telephone Encounter (Signed)
I didn't realize he had appt scheduled, but appt was advised after last refill.  So he is due for f/u which is the reason for the refill on controlled substances /pain medication.   If needed I can write for a few to get him by til apt.

## 2015-10-26 NOTE — Telephone Encounter (Signed)
Is this okay?

## 2015-10-26 NOTE — Telephone Encounter (Signed)
Pt called to inquire about his Tramadol 50mg  refill request that was denied as needing an appt. Pt says he has an appt on Monday so he is not sure why refill denied.

## 2015-10-27 ENCOUNTER — Other Ambulatory Visit: Payer: Self-pay | Admitting: Medical

## 2015-10-27 MED ORDER — TRAMADOL HCL 50 MG PO TABS: ORAL_TABLET | ORAL | Status: DC

## 2015-10-27 NOTE — Telephone Encounter (Signed)
rx ready for call out or pickup

## 2015-10-27 NOTE — Telephone Encounter (Signed)
Pt would like refill on tramadol now to last until appt

## 2015-10-27 NOTE — Telephone Encounter (Signed)
Called med in to pharmacy since pt did say to send it to his pharmacy

## 2015-10-28 ENCOUNTER — Other Ambulatory Visit: Payer: Self-pay | Admitting: Medical

## 2015-10-30 ENCOUNTER — Ambulatory Visit (INDEPENDENT_AMBULATORY_CARE_PROVIDER_SITE_OTHER): Payer: Medicare Other | Admitting: Medical

## 2015-10-30 ENCOUNTER — Encounter: Payer: Self-pay | Admitting: Medical

## 2015-10-30 VITALS — BP 130/60 | HR 42 | Ht 69.5 in | Wt 207.0 lb

## 2015-10-30 DIAGNOSIS — R251 Tremor, unspecified: Secondary | ICD-10-CM

## 2015-10-30 DIAGNOSIS — M6281 Muscle weakness (generalized): Secondary | ICD-10-CM

## 2015-10-30 DIAGNOSIS — Z973 Presence of spectacles and contact lenses: Secondary | ICD-10-CM

## 2015-10-30 DIAGNOSIS — M25512 Pain in left shoulder: Secondary | ICD-10-CM

## 2015-10-30 DIAGNOSIS — G4733 Obstructive sleep apnea (adult) (pediatric): Secondary | ICD-10-CM

## 2015-10-30 DIAGNOSIS — G4700 Insomnia, unspecified: Secondary | ICD-10-CM | POA: Diagnosis not present

## 2015-10-30 DIAGNOSIS — R5383 Other fatigue: Secondary | ICD-10-CM | POA: Diagnosis not present

## 2015-10-30 DIAGNOSIS — M48061 Spinal stenosis, lumbar region without neurogenic claudication: Secondary | ICD-10-CM

## 2015-10-30 DIAGNOSIS — G3184 Mild cognitive impairment, so stated: Secondary | ICD-10-CM

## 2015-10-30 DIAGNOSIS — Z9989 Dependence on other enabling machines and devices: Secondary | ICD-10-CM

## 2015-10-30 DIAGNOSIS — M25519 Pain in unspecified shoulder: Secondary | ICD-10-CM | POA: Diagnosis not present

## 2015-10-30 DIAGNOSIS — I10 Essential (primary) hypertension: Secondary | ICD-10-CM

## 2015-10-30 DIAGNOSIS — E0841 Diabetes mellitus due to underlying condition with diabetic mononeuropathy: Secondary | ICD-10-CM | POA: Diagnosis not present

## 2015-10-30 DIAGNOSIS — M79605 Pain in left leg: Secondary | ICD-10-CM | POA: Diagnosis not present

## 2015-10-30 DIAGNOSIS — M4806 Spinal stenosis, lumbar region: Secondary | ICD-10-CM

## 2015-10-30 DIAGNOSIS — E785 Hyperlipidemia, unspecified: Secondary | ICD-10-CM

## 2015-10-30 DIAGNOSIS — I82599 Chronic embolism and thrombosis of other specified deep vein of unspecified lower extremity: Secondary | ICD-10-CM

## 2015-10-30 DIAGNOSIS — M25511 Pain in right shoulder: Secondary | ICD-10-CM | POA: Diagnosis not present

## 2015-10-30 DIAGNOSIS — M79609 Pain in unspecified limb: Secondary | ICD-10-CM | POA: Diagnosis not present

## 2015-10-30 DIAGNOSIS — R001 Bradycardia, unspecified: Secondary | ICD-10-CM | POA: Diagnosis not present

## 2015-10-30 DIAGNOSIS — R531 Weakness: Secondary | ICD-10-CM | POA: Diagnosis not present

## 2015-10-30 DIAGNOSIS — G589 Mononeuropathy, unspecified: Secondary | ICD-10-CM | POA: Diagnosis not present

## 2015-10-30 DIAGNOSIS — Z7185 Encounter for immunization safety counseling: Secondary | ICD-10-CM

## 2015-10-30 DIAGNOSIS — R21 Rash and other nonspecific skin eruption: Secondary | ICD-10-CM | POA: Insufficient documentation

## 2015-10-30 DIAGNOSIS — M545 Low back pain: Secondary | ICD-10-CM

## 2015-10-30 DIAGNOSIS — M79604 Pain in right leg: Secondary | ICD-10-CM

## 2015-10-30 DIAGNOSIS — I2692 Saddle embolus of pulmonary artery without acute cor pulmonale: Secondary | ICD-10-CM

## 2015-10-30 DIAGNOSIS — G8929 Other chronic pain: Secondary | ICD-10-CM

## 2015-10-30 DIAGNOSIS — Z7189 Other specified counseling: Secondary | ICD-10-CM

## 2015-10-30 DIAGNOSIS — Z0000 Encounter for general adult medical examination without abnormal findings: Secondary | ICD-10-CM | POA: Diagnosis not present

## 2015-10-30 DIAGNOSIS — L989 Disorder of the skin and subcutaneous tissue, unspecified: Secondary | ICD-10-CM

## 2015-10-30 LAB — COMPREHENSIVE METABOLIC PANEL
ALBUMIN: 4 g/dL (ref 3.6–5.1)
ALK PHOS: 90 U/L (ref 40–115)
ALT: 14 U/L (ref 9–46)
AST: 18 U/L (ref 10–35)
BUN: 18 mg/dL (ref 7–25)
CALCIUM: 9.3 mg/dL (ref 8.6–10.3)
CHLORIDE: 104 mmol/L (ref 98–110)
CO2: 24 mmol/L (ref 20–31)
Creat: 0.78 mg/dL (ref 0.70–1.18)
Glucose, Bld: 107 mg/dL — ABNORMAL HIGH (ref 65–99)
POTASSIUM: 4.9 mmol/L (ref 3.5–5.3)
Sodium: 137 mmol/L (ref 135–146)
TOTAL PROTEIN: 6.8 g/dL (ref 6.1–8.1)
Total Bilirubin: 0.6 mg/dL (ref 0.2–1.2)

## 2015-10-30 LAB — LIPID PANEL
CHOL/HDL RATIO: 5.4 ratio — AB (ref <=5.0)
CHOLESTEROL: 212 mg/dL — AB (ref 125–200)
HDL: 39 mg/dL — ABNORMAL LOW (ref >=40)
LDL Cholesterol: 151 mg/dL — ABNORMAL HIGH (ref <130)
TRIGLYCERIDES: 108 mg/dL (ref <150)
VLDL: 22 mg/dL (ref <30)

## 2015-10-30 LAB — HEMOGLOBIN A1C
Hgb A1c MFr Bld: 6.9 % — ABNORMAL HIGH (ref <5.7)
Mean Plasma Glucose: 151 mg/dL — ABNORMAL HIGH (ref <117)

## 2015-10-30 LAB — CK: Total CK: 21 U/L (ref 7–232)

## 2015-10-30 MED ORDER — HYDROCODONE-ACETAMINOPHEN 5-325 MG PO TABS: ORAL_TABLET | ORAL | Status: DC

## 2015-10-30 NOTE — Telephone Encounter (Signed)
Pt is here today.
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
## 2015-10-30 NOTE — Telephone Encounter (Signed)
Is this ok to refill?  

## 2015-10-31 ENCOUNTER — Telehealth: Payer: Self-pay

## 2015-10-31 LAB — SEDIMENTATION RATE: Sed Rate: 17 mm/hr (ref 0–20)

## 2015-10-31 NOTE — Telephone Encounter (Signed)
error 

## 2015-11-01 ENCOUNTER — Other Ambulatory Visit: Payer: Self-pay | Admitting: Medical

## 2015-11-01 ENCOUNTER — Telehealth: Payer: Self-pay | Admitting: Medical

## 2015-11-01 DIAGNOSIS — M25552 Pain in left hip: Secondary | ICD-10-CM

## 2015-11-01 DIAGNOSIS — M25511 Pain in right shoulder: Secondary | ICD-10-CM

## 2015-11-01 DIAGNOSIS — M25551 Pain in right hip: Secondary | ICD-10-CM

## 2015-11-01 MED ORDER — DULAGLUTIDE 1.5 MG/0.5ML ~~LOC~~ SOAJ: 1.5000 mg | SUBCUTANEOUS | Status: DC

## 2015-11-01 MED ORDER — PRAVASTATIN SODIUM 20 MG PO TABS: 20.0000 mg | ORAL_TABLET | Freq: Every day | ORAL | Status: DC

## 2015-11-01 MED ORDER — INVOKAMET 150-1000 MG PO TABS: 2.0000 | ORAL_TABLET | Freq: Every day | ORAL | Status: DC

## 2015-11-01 NOTE — Telephone Encounter (Signed)
pls SCAN the MOST form that patient brought back signed.

## 2015-11-02 ENCOUNTER — Ambulatory Visit: Payer: Medicare Other | Admitting: Internal Medicine

## 2015-11-02 ENCOUNTER — Ambulatory Visit
Admission: RE | Admit: 2015-11-02 | Discharge: 2015-11-02 | Disposition: A | Discharge status: Home / Self Care | Payer: Medicare Other | Source: Ambulatory Visit | Attending: Medical | Admitting: Medical

## 2015-11-02 DIAGNOSIS — M25552 Pain in left hip: Secondary | ICD-10-CM

## 2015-11-02 DIAGNOSIS — M160 Bilateral primary osteoarthritis of hip: Secondary | ICD-10-CM | POA: Diagnosis not present

## 2015-11-02 DIAGNOSIS — M25511 Pain in right shoulder: Secondary | ICD-10-CM | POA: Diagnosis not present

## 2015-11-02 DIAGNOSIS — M25551 Pain in right hip: Secondary | ICD-10-CM

## 2015-11-03 ENCOUNTER — Telehealth: Payer: Self-pay | Admitting: Cardiology

## 2015-11-03 NOTE — Telephone Encounter (Signed)
PA-C saw pt on 12/5 and recorded Hr was 42 ,pulse obtained by pulse ox)) and SaO2 of 92%  They called today from pcp and states wife says pt has been weak for weeks.I told her I would message Dr Harl Bowie.Pt was place on narcotics for pain after having fallen.Dr branch states pt is notorious for ectopy as he sees Dr Lovena Le.courtney will speak with wife

## 2015-11-03 NOTE — Telephone Encounter (Signed)
Christian [name] is from [place] her telephone # is [phone]

## 2015-11-03 NOTE — Telephone Encounter (Signed)
Loma Sousa called the [place] office. She said to please have a nurse call her at [phone] ext # [phone]

## 2015-11-03 NOTE — Telephone Encounter (Signed)
Christian [name] called concerning the pt and stated it's Urgent she speak with Dr. Nelly Laurence nurse concerning this pt

## 2015-11-07 ENCOUNTER — Encounter: Payer: Self-pay | Admitting: Internal Medicine

## 2015-11-07 ENCOUNTER — Ambulatory Visit (INDEPENDENT_AMBULATORY_CARE_PROVIDER_SITE_OTHER): Payer: Medicare Other | Admitting: Internal Medicine

## 2015-11-07 VITALS — BP 122/60 | HR 50 | Ht 71.0 in | Wt 206.0 lb

## 2015-11-07 DIAGNOSIS — R001 Bradycardia, unspecified: Secondary | ICD-10-CM | POA: Diagnosis not present

## 2015-11-07 DIAGNOSIS — I10 Essential (primary) hypertension: Secondary | ICD-10-CM | POA: Diagnosis not present

## 2015-11-07 DIAGNOSIS — F0390 Unspecified dementia without behavioral disturbance: Secondary | ICD-10-CM

## 2015-11-07 DIAGNOSIS — I2699 Other pulmonary embolism without acute cor pulmonale: Secondary | ICD-10-CM

## 2015-11-07 DIAGNOSIS — I493 Ventricular premature depolarization: Secondary | ICD-10-CM

## 2015-11-07 MED ORDER — FLECAINIDE ACETATE 150 MG PO TABS: 75.0000 mg | ORAL_TABLET | Freq: Two times a day (BID) | ORAL | Status: DC

## 2015-11-07 NOTE — Patient Instructions (Signed)
Your physician recommends that you schedule a follow-up appointment in: 3 months with Dr. Lovena Le  Your physician has recommended you make the following change in your medication:   Stop Taking : Flexeril, [place] physician recommends that you schedule a follow-up appointment in: 2 weeks with Lattie Haw in the [place] has recommended that you wear a 24 hour holter monitor. Holter monitors are medical devices that record the heart's electrical activity. Doctors most often use these monitors to diagnose arrhythmias. Arrhythmias are problems with the speed or rhythm of the heartbeat. The monitor is a small, portable device. You can wear one while you do your normal daily activities. This is usually used to diagnose what is causing palpitations/syncope (passing out).  If you need a refill on your cardiac medications before your next appointment, please call your pharmacy.  Thank you for choosing [facility]!
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
## 2015-11-08 NOTE — Assessment & Plan Note (Signed)
He will undergo repeat 24 hour holter to determine his PVC burden.

## 2015-11-08 NOTE — Assessment & Plan Note (Signed)
His blood pressure is low. He will continue his current meds for now.

## 2015-11-08 NOTE — Assessment & Plan Note (Signed)
He has been on Aricept. He will continue this for now. It could be contributing to his bradycardia however.

## 2015-11-08 NOTE — Assessment & Plan Note (Signed)
The patient has felt poorly since he began taking Xarelto for his PE. As he has been on this medication for almost a year, we will stop his Xarelto for two weeks. Will plan to switch him to warfarin.

## 2015-11-09 ENCOUNTER — Encounter: Payer: Self-pay | Admitting: Internal Medicine

## 2015-11-09 ENCOUNTER — Telehealth: Payer: Self-pay | Admitting: Cardiology

## 2015-11-09 ENCOUNTER — Telehealth: Payer: Self-pay | Admitting: Medical

## 2015-11-09 ENCOUNTER — Ambulatory Visit (HOSPITAL_COMMUNITY)
Admission: RE | Admit: 2015-11-09 | Discharge: 2015-11-09 | Disposition: A | Discharge status: Home / Self Care | Payer: Medicare Other | Source: Ambulatory Visit | Attending: Internal Medicine | Admitting: Internal Medicine

## 2015-11-09 DIAGNOSIS — I493 Ventricular premature depolarization: Secondary | ICD-10-CM | POA: Insufficient documentation

## 2015-11-09 NOTE — Telephone Encounter (Signed)
This encounter was created in error - please disregard.

## 2015-11-09 NOTE — Telephone Encounter (Signed)
Wife questioned if pt is to be on diltiazem 30 mg BID,according to Dr Nelly Laurence note in October he is on med.But pt saw Chana Bode PA-C on 10/30/15 and med was d/c'd.I asked him to call their office to clarify

## 2015-11-09 NOTE — Telephone Encounter (Signed)
Please call Oneita Hurt at [facility] [phone].  She wants to know why Deltiazem was discontinued.

## 2015-11-09 NOTE — Telephone Encounter (Signed)
Patient's wife has questions about patient's medications. / tg

## 2015-11-09 NOTE — Telephone Encounter (Signed)
his cardiologist and heart team manage his BP medications and heart rate medications such as Flecainide and Diltiazem.  There was notation about Diltiazem being discontinued here on 10/30/15, but that was his physical visit, and we simply recordered what he told us he was or was not taking.   I certainly didn't stop or change any of his BP or heart medication.   Thus, he should call cardiology back to verify.     If the Diltiazem wasn't recorded on 10/30/15 visit, that is because he must not have had it with him.  I believe he brought all of his medications with him that visit, and the nurse recorded what he reported as taking then.   But no, we have not stopped or changed his heart or BP medications.     Loma Sousa - please call Dr. Ellyn Hack and Dr. Tanna Furry office to help clarify for this patient on what BP and heart medications they want him on as he is confused about this.

## 2015-11-09 NOTE — Telephone Encounter (Signed)
New Message  Neuse [place] from [facility] called to have RN contact pt to clarify BP medication. Loma Sousa stated that the pt was confused on taking his diltiazem @ his last ov. Please call back and discuss.

## 2015-11-09 NOTE — Telephone Encounter (Signed)
Pt saw [place] PA-C on 10/30/15 and Tramadol and Diltiazem were both d/c'd according to his note.I tried to teach office at 3:45 on MD  line and sent to answering service do to high call volume.i asked for them to call back to discuss   4:16 pm Beverlee Nims called from [place] will call me in the am

## 2015-11-09 NOTE — Telephone Encounter (Signed)
Called [facility] and they said the drs nurse is in office and they are getting her to call him and go over medications with him

## 2015-11-09 NOTE — Telephone Encounter (Signed)
Christian [name] at 11/09/2015 2:43 PM     Status: Signed       Expand All Collapse All   New Message  Courtney from [facility] called to have RN contact pt to clarify BP medication. Loma Sousa stated that the pt was confused on taking his diltiazem @ his last ov. Please call back and discuss.         Still seems to be lots of confusion in regards to the Cardizem.  Can you call the wife and clarify.  She says he has been out of the medication for 10 days

## 2015-11-09 NOTE — Telephone Encounter (Signed)
Pt and wife called wanting to know if pt should be taking Diltiazem 30mg . Pt is confused about this med and thinks that [name] or another doctor may have asked him to stop taking this med but he can not remember who told him to stop.

## 2015-11-14 ENCOUNTER — Ambulatory Visit: Payer: Medicare Other | Admitting: Internal Medicine

## 2015-11-23 NOTE — Telephone Encounter (Signed)
See msg

## 2015-11-29 ENCOUNTER — Ambulatory Visit (INDEPENDENT_AMBULATORY_CARE_PROVIDER_SITE_OTHER): Payer: Medicare Other | Admitting: *Deleted

## 2015-11-29 ENCOUNTER — Telehealth: Payer: Self-pay | Admitting: Cardiology

## 2015-11-29 DIAGNOSIS — I2782 Chronic pulmonary embolism: Secondary | ICD-10-CM

## 2015-11-29 DIAGNOSIS — I82493 Acute embolism and thrombosis of other specified deep vein of lower extremity, bilateral: Secondary | ICD-10-CM

## 2015-11-29 DIAGNOSIS — Z5181 Encounter for therapeutic drug level monitoring: Secondary | ICD-10-CM

## 2015-11-29 LAB — POCT INR: INR: 1

## 2015-11-29 MED ORDER — WARFARIN SODIUM 5 MG PO TABS: 5.0000 mg | ORAL_TABLET | Freq: Every day | ORAL | Status: DC

## 2015-11-29 NOTE — Telephone Encounter (Signed)
Pt would like to know when he can start going back to Cardiac Rehab

## 2015-11-29 NOTE — Telephone Encounter (Signed)
Pt was stopped to to bradycardia,saw Coumadin nurse today and pt dropped off BP log,ptressures range 113/59 to 145/83  HR 65-86.I placed readings on MD desk.May pt return to cardiac rehab?

## 2015-11-29 NOTE — Telephone Encounter (Signed)
LMTCB-cc 

## 2015-11-30 ENCOUNTER — Other Ambulatory Visit: Payer: Self-pay | Admitting: Medical

## 2015-11-30 NOTE — Telephone Encounter (Signed)
Is this ok to refill?  

## 2015-11-30 NOTE — Telephone Encounter (Signed)
Please forward to Dr Lovena Le, there have been quite a few visits and med changes with Dr Lovena Le since I last saw this patient in October.   Zandra Abts MD

## 2015-11-30 NOTE — Telephone Encounter (Signed)
Will forward to Dr. Taylor.  

## 2015-12-01 ENCOUNTER — Telehealth: Payer: Self-pay

## 2015-12-01 NOTE — Telephone Encounter (Signed)
-----   Message from Arnoldo Lenis, MD sent at 12/01/2015 11:15 AM EST ----- Submiitted bp log and pulse rates look good. BP 115-138/60-80 with pulses rates 70-80s.   Zandra Abts MD

## 2015-12-01 NOTE — Telephone Encounter (Signed)
I spoke with patient and wife and told him of dr branch's message regarding BP log.pt has number to call cardiac rehab to schedule his apt

## 2015-12-03 ENCOUNTER — Telehealth: Payer: Self-pay | Admitting: Medical

## 2015-12-03 NOTE — Telephone Encounter (Signed)
Have him return for f/u, weight loss, and dilutional imaging and eval due to weight loss.   Also, have them call cardiology about getting him into cardiac rehab right away.

## 2015-12-04 NOTE — Telephone Encounter (Signed)
States she spoke with dr taylors office and they said that it was fine, but the rehab wouldn't except that. She stated that she cant get ahold of anyone today. Will try again to get them to call the rehab and set  Him up. He has f/up appt with [place] Friday.

## 2015-12-04 NOTE — Telephone Encounter (Signed)
LMTCB

## 2015-12-05 ENCOUNTER — Telehealth: Payer: Self-pay | Admitting: *Deleted

## 2015-12-05 NOTE — Telephone Encounter (Signed)
Pt would like to know if having his coumadin checked on 1/23 is putting it to far out? They are unable to make early morning apts and they're wanting to have the apt time after 11, the first available around that time is only the 23rd.

## 2015-12-05 NOTE — Telephone Encounter (Signed)
Spoke with pt.  Told him his INR needed to be checked this week.  Appt made for 12/07/15 at 2:30pm.  He is in agreement.

## 2015-12-07 ENCOUNTER — Ambulatory Visit (INDEPENDENT_AMBULATORY_CARE_PROVIDER_SITE_OTHER): Payer: Medicare Other | Admitting: *Deleted

## 2015-12-07 DIAGNOSIS — I82493 Acute embolism and thrombosis of other specified deep vein of lower extremity, bilateral: Secondary | ICD-10-CM | POA: Diagnosis not present

## 2015-12-07 DIAGNOSIS — I2782 Chronic pulmonary embolism: Secondary | ICD-10-CM | POA: Diagnosis not present

## 2015-12-07 DIAGNOSIS — Z5181 Encounter for therapeutic drug level monitoring: Secondary | ICD-10-CM | POA: Diagnosis not present

## 2015-12-07 LAB — POCT INR: INR: 3.1

## 2015-12-07 NOTE — Telephone Encounter (Signed)
Please call patient's wife regarding this message / tg

## 2015-12-08 ENCOUNTER — Encounter: Payer: Self-pay | Admitting: Medical

## 2015-12-08 ENCOUNTER — Ambulatory Visit: Payer: Self-pay | Admitting: Medical

## 2015-12-08 ENCOUNTER — Other Ambulatory Visit: Payer: Self-pay | Admitting: Medical

## 2015-12-08 ENCOUNTER — Ambulatory Visit
Admission: RE | Admit: 2015-12-08 | Discharge: 2015-12-08 | Disposition: A | Discharge status: Home / Self Care | Payer: Medicare Other | Source: Ambulatory Visit | Attending: Medical | Admitting: Medical

## 2015-12-08 ENCOUNTER — Ambulatory Visit (INDEPENDENT_AMBULATORY_CARE_PROVIDER_SITE_OTHER): Payer: Medicare Other | Admitting: Medical

## 2015-12-08 VITALS — BP 100/64 | HR 72 | Wt 206.0 lb

## 2015-12-08 DIAGNOSIS — M25511 Pain in right shoulder: Secondary | ICD-10-CM

## 2015-12-08 DIAGNOSIS — R5381 Other malaise: Secondary | ICD-10-CM

## 2015-12-08 DIAGNOSIS — R634 Abnormal weight loss: Secondary | ICD-10-CM | POA: Diagnosis not present

## 2015-12-08 DIAGNOSIS — Z125 Encounter for screening for malignant neoplasm of prostate: Secondary | ICD-10-CM | POA: Diagnosis not present

## 2015-12-08 DIAGNOSIS — M25512 Pain in left shoulder: Secondary | ICD-10-CM | POA: Diagnosis not present

## 2015-12-08 DIAGNOSIS — M545 Low back pain, unspecified: Secondary | ICD-10-CM

## 2015-12-08 DIAGNOSIS — Z86711 Personal history of pulmonary embolism: Secondary | ICD-10-CM

## 2015-12-08 DIAGNOSIS — R001 Bradycardia, unspecified: Secondary | ICD-10-CM | POA: Diagnosis not present

## 2015-12-08 DIAGNOSIS — R5382 Chronic fatigue, unspecified: Secondary | ICD-10-CM

## 2015-12-08 DIAGNOSIS — M25519 Pain in unspecified shoulder: Secondary | ICD-10-CM | POA: Diagnosis not present

## 2015-12-08 LAB — BASIC METABOLIC PANEL
BUN: 13 mg/dL (ref 7–25)
CALCIUM: 9.4 mg/dL (ref 8.6–10.3)
CO2: 26 mmol/L (ref 20–31)
CREATININE: 0.69 mg/dL — AB (ref 0.70–1.18)
Chloride: 100 mmol/L (ref 98–110)
GLUCOSE: 173 mg/dL — AB (ref 65–99)
Potassium: 4.8 mmol/L (ref 3.5–5.3)
SODIUM: 138 mmol/L (ref 135–146)

## 2015-12-08 LAB — CBC
HCT: 45.3 % (ref 39.0–52.0)
HEMOGLOBIN: 14.9 g/dL (ref 13.0–17.0)
MCH: 29 pg (ref 26.0–34.0)
MCHC: 32.9 g/dL (ref 30.0–36.0)
MCV: 88.1 fL (ref 78.0–100.0)
MPV: 10 fL (ref 8.6–12.4)
Platelets: 296 10*3/uL (ref 150–400)
RBC: 5.14 MIL/uL (ref 4.22–5.81)
RDW: 12.6 % (ref 11.5–15.5)
WBC: 6 10*3/uL (ref 4.0–10.5)

## 2015-12-08 MED ORDER — TRAMADOL HCL 50 MG PO TABS: 50.0000 mg | ORAL_TABLET | Freq: Two times a day (BID) | ORAL | Status: DC

## 2015-12-08 NOTE — Progress Notes (Signed)
Subjective: Chief Complaint  Patient presents with  . Follow-up    wife stated to wait on her to discuss his probelms. pt does not know what he is or isnt taking so waiting on kathy to verify that   Here for several concerns.   accompanied by wife.  First concern of wife's, not necessarily his, is weight loss.  Apparently he was in the 250lb range back in 2014.  Around a year ago he thinks he was in the 230lb range.   Wife thinks he has lost 20lb in recent months, although per our chart he has been stable since 01/2015 for the most part.  He denies blood in stool or urine, no night sweats, no fevers, no bowel changes.   Wants to find out why he is losing/or has lost weight.   He notes ongoing pains in his back and both shoulders, chronic.  Is upset I didn't refill his pain medication.  He notes pain is limiting, can't move arms above his head fully, sits in chair most of the day in pain and not having energy.  Taking Celebrex per hematology.  Wife says he just sits in the chair all day.  They have called cariology to work on getting cardiac rehab but they won't approve it yet, still pending Dr. Tanna Furry ok.  He sees coumadin clinic and in recent months xarelto was stopped due to possible bradycardia effects and he was started on Coumadin.  Past Medical History  Diagnosis Date  . High cholesterol   . Hypertension   . OSA on CPAP   . Insomnia   . Pulmonary embolism ([place]) 01/2015  . DVT (deep venous thrombosis) ([place]) 01/2015  . Diabetes ([place])     type 2  . Chronic back pain   . Spinal stenosis     lumbar  . Leg pain, diffuse   . Cataract   . Tremor     on propranolol  . Wears glasses   . Hearing impaired   . Mild cognitive impairment     sees [facility]  . Memory loss   . Insomnia   . Numbness     fingers, feet, toes  . Leg swelling   . Confusion   . Agitation   . 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 06/14/2015   ROS as  in subjective   Objective: BP 100/64 mmHg  Pulse 72  Wt 206 lb (93.441 kg)  General appearence: alert, no distress, WD/WN  HEENT: normocephalic, sclerae anicteric, TMs pearly, nares patent, no discharge or erythema, pharynx normal Oral cavity: MMM, no lesions Neck: supple, no lymphadenopathy, no thyromegaly, no masses Heart: RRR, normal S1, S2, no murmurs Lungs: CTA bilaterally, no wheezes, rhonchi, or rales Abdomen: +bs, soft, non tender, non distended, no masses, no hepatomegaly, no splenomegaly Pulses: 2+ symmetric, upper and lower extremities, normal cap refill Rectal -declined Ext: no edema Neuro: CN2-12 intact, normal strength, sesnation, DTRs 1+, slow gait, cautious, slight physiologic tremor noted     Assessment: Encounter Diagnoses  Name Primary?  . Loss of weight Yes  . Pain of both shoulder joints   . Chronic fatigue   . Bilateral low back pain without sciatica   . Physical deconditioning   . Prostate cancer screening   . History of pulmonary embolism   . Bradycardia     Plan: discussed all of their concerns.    His weight is somewhat stable but wife feels like he is  eating to much to be losing weight, so worried something is going on.   Wife seems to want to check "everything" whereas it may not be worthwhile being so aggressive on eval when weights seem to be stable.  Nevertheless, CXR and labs today.  We can consider CT abdomen/chest if needed.  Will get copy of 2014 Colonoscopy from Dr. Benson Norway.   Chronic pain, back pain, shoulder pain - can use Ultram BID for pain.   reviewed shoulder xray from my visit with him a few months ago.   Consider ortho going forward.  advised he avoid the flexeril for now.  bradycardia - improved off xarelto, c/t f/u with cardiology.  Advised they continue to contact cardiology about approval for cardiac rehab  Overall he is deconditioned from the PE/DVTs, he is not doing much now in the way of activity, he just recently got to a  point where his pulse rate wasn't so bradycardic, and he has pains we are working to improve.     F/u pending labs and call back  Brendan was seen today for follow-up.  Diagnoses and all orders for this visit:  Loss of weight -     TSH -     CBC -     PSA, Medicare -     Basic metabolic panel -     DG Chest 2 View; Future  Pain of both shoulder joints -     TSH -     CBC -     PSA, Medicare -     Basic metabolic panel -     DG Chest 2 View; Future  Chronic fatigue -     TSH -     CBC -     PSA, Medicare -     Basic metabolic panel -     DG Chest 2 View; Future  Bilateral low back pain without sciatica -     TSH -     CBC -     PSA, Medicare -     Basic metabolic panel -     DG Chest 2 View; Future  Physical deconditioning -     TSH -     CBC -     PSA, Medicare -     Basic metabolic panel -     DG Chest 2 View; Future  Prostate cancer screening -     PSA, Medicare  History of pulmonary embolism  Bradycardia  Other orders -     traMADol (ULTRAM) 50 MG tablet; Take 1 tablet (50 mg total) by mouth 2 (two) times daily.

## 2015-12-09 LAB — PSA, MEDICARE: PSA: 1.66 ng/mL (ref <=4.00)

## 2015-12-09 LAB — TSH: TSH: 0.889 u[IU]/mL (ref 0.350–4.500)

## 2015-12-11 NOTE — Telephone Encounter (Signed)
Denying, pt was here on 1/13 and it was printed

## 2015-12-12 ENCOUNTER — Ambulatory Visit (INDEPENDENT_AMBULATORY_CARE_PROVIDER_SITE_OTHER): Payer: Medicare Other | Admitting: Pharmacist

## 2015-12-12 DIAGNOSIS — Z5181 Encounter for therapeutic drug level monitoring: Secondary | ICD-10-CM

## 2015-12-12 DIAGNOSIS — I82493 Acute embolism and thrombosis of other specified deep vein of lower extremity, bilateral: Secondary | ICD-10-CM | POA: Diagnosis not present

## 2015-12-12 DIAGNOSIS — I2782 Chronic pulmonary embolism: Secondary | ICD-10-CM | POA: Diagnosis not present

## 2015-12-12 LAB — POCT INR: INR: 4.1

## 2015-12-12 NOTE — Telephone Encounter (Signed)
He will need to get this from Dr. Harl Bowie. GT

## 2015-12-12 NOTE — Telephone Encounter (Signed)
Will forward to MD.  

## 2015-12-12 NOTE — Telephone Encounter (Signed)
Pt would like to know when he can go back to Cardiac Rehab

## 2015-12-13 NOTE — Telephone Encounter (Signed)
Patient has appt for tomorrow at 1:20. Spoke with wife.

## 2015-12-13 NOTE — Telephone Encounter (Signed)
I have not seen him in 3 months, I'd like to see him in follow up to see how he is doing with the med changes and then decide. Can put him on Thurs or Frid after 12 if he is available   Zandra Abts MD

## 2015-12-14 ENCOUNTER — Ambulatory Visit (INDEPENDENT_AMBULATORY_CARE_PROVIDER_SITE_OTHER): Payer: Medicare Other | Admitting: Cardiology

## 2015-12-14 ENCOUNTER — Encounter: Payer: Self-pay | Admitting: Cardiology

## 2015-12-14 VITALS — BP 132/76 | HR 75 | Ht 70.0 in | Wt 204.0 lb

## 2015-12-14 DIAGNOSIS — I493 Ventricular premature depolarization: Secondary | ICD-10-CM

## 2015-12-14 DIAGNOSIS — R5382 Chronic fatigue, unspecified: Secondary | ICD-10-CM | POA: Diagnosis not present

## 2015-12-14 DIAGNOSIS — R0602 Shortness of breath: Secondary | ICD-10-CM

## 2015-12-14 DIAGNOSIS — I480 Paroxysmal atrial fibrillation: Secondary | ICD-10-CM | POA: Diagnosis not present

## 2015-12-14 DIAGNOSIS — G4730 Sleep apnea, unspecified: Secondary | ICD-10-CM | POA: Diagnosis not present

## 2015-12-14 MED ORDER — PRAVASTATIN SODIUM 20 MG PO TABS: 20.0000 mg | ORAL_TABLET | Freq: Every day | ORAL | Status: DC

## 2015-12-14 NOTE — Progress Notes (Addendum)
Patient ID: DAEGEN BEACHLER, male   DOB: [birth year], 75 y.o.   MRN: [id number]     Clinical Summary Mr. Mohamad is a 75 y.o.male seen today for follow up of the following medical problems.   1. PVCs - history of frequent ventricular ectopy. Workup for underlying structural heart disease has been negative, including echo and Lexiscan - he has had generalized fatigue but no palpitations, thought perhaps related to frequent venricular ectopy. Seen by EP and started on flecanide by Dr Lovena Le - repeat 24 hr holter 10/2015 with 3000 PVCs, this is down from approx 10,000 from holter in 08/2015. He was started on flecanide by Dr Lovena Le and is currently on 75mg  bid.  - denies any palpitations since last visit. Continues to have fatigue  2. PE - 01/2015 CT ches twith large volume PE with evidence of RV strain.  - has been on anticoag since that time. Echo 05/2015 shows resolution of RV strain - anticoag has been managed by his heme/onc physician  3. Bradycardia - heart rate by vitals are inaccurate due to frequent PVCs. Typically bradycardia by dynamap check however follow up EKG shows normal rates with ventricular bigeminy. I have informed patient and his wife about this to be sure other providers are aware during there visits.   4. Fatigue - does not appear to be cardiac related. There have been other issues including weight loss. Workup underoing by pcp - reports history of OSA but poor compliance with CPAP mainly due to discomfort   Past Medical History  Diagnosis Date  . High cholesterol   . Hypertension   . OSA on CPAP   . Insomnia   . Pulmonary embolism ([place]) 01/2015  . DVT (deep venous thrombosis) ([place]) 01/2015  . Diabetes ([place])     type 2  . Chronic back pain   . Spinal stenosis     lumbar  . Leg pain, diffuse   . Cataract   . Tremor     on propranolol  . Wears glasses   . Hearing impaired   . Mild cognitive impairment     sees [facility]  . Memory loss   . Insomnia    . Numbness     fingers, feet, toes  . Leg swelling   . Confusion   . Agitation   . 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 06/14/2015     Allergies  Allergen Reactions  . Bee Venom Swelling  . Oxycodone Other (See Comments)    Mental status changes  . Oxycontin [Oxycodone Hcl] Other (See Comments)    Mental status changes per pt     Current Outpatient Prescriptions  Medication Sig Dispense Refill  . celecoxib (CELEBREX) 100 MG capsule Take 100 mg by mouth 2 (two) times daily.    [name] diltiazem (CARDIZEM) 30 MG tablet Take 30 mg by mouth 2 (two) times daily.    [name] donepezil (ARICEPT) 10 MG tablet TAKE ONE TABLET AT BEDTIME. 30 tablet 3  . Dulaglutide (TRULICITY) 1.5 0000000 SOPN Inject 1.5 mg into the skin once a week. 2 mL 5  . EPINEPHrine (EPIPEN 2-PAK) 0.3 mg/0.3 mL IJ SOAJ injection Inject 0.3 mLs (0.3 mg total) into the muscle once. 1 Device 0  . flecainide (TAMBOCOR) 150 MG tablet Take 0.5 tablets (75 mg total) by mouth 2 (two) times daily. 45 tablet 3  . HYDROcodone-acetaminophen (NORCO/VICODIN) 5-325 MG tablet 1 tablet 1-2 times daily prn pain 60 tablet 0  .  INVOKAMET [phone] MG TABS Take 2 tablets by mouth daily. 180 tablet 1  . iron polysaccharides (NIFEREX) 150 MG capsule Take 1 capsule (150 mg total) by mouth daily. 30 capsule 6  . Misc Natural Products (OSTEO BI-FLEX ADV JOINT SHIELD PO) Take 1 tablet by mouth daily.     . pravastatin (PRAVACHOL) 20 MG tablet Take 1 tablet (20 mg total) by mouth daily. 90 tablet 0  . traMADol (ULTRAM) 50 MG tablet Take 1 tablet (50 mg total) by mouth 2 (two) times daily. 180 tablet 0  . warfarin (COUMADIN) 5 MG tablet Take 1 tablet (5 mg total) by mouth daily at 6 PM. 45 tablet 3   No current facility-administered medications for this visit.     Past Surgical History  Procedure Laterality Date  . Tonsillectomy    . Cataract extraction    . Colonoscopy  2014     Allergies    Allergen Reactions  . Bee Venom Swelling  . Oxycodone Other (See Comments)    Mental status changes  . Oxycontin [Oxycodone Hcl] Other (See Comments)    Mental status changes per pt      Family History  Problem Relation Age of Onset  . Dementia Father   . Heart disease Brother   . Clotting disorder Brother   . Psychiatric Illness Sister      Social History Mr. Buffa reports that he quit smoking about 11 years ago. His smoking use included Cigarettes. He has a 10 pack-year smoking history. He has never used smokeless tobacco. Mr. Saulter reports that he does not drink alcohol.   Review of Systems CONSTITUTIONAL: +fatigue  HEENT: Eyes: No visual loss, blurred vision, double vision or yellow sclerae.No hearing loss, sneezing, congestion, runny nose or sore throat.  SKIN: No rash or itching.  CARDIOVASCULAR: per hpi RESPIRATORY: No shortness of breath, cough or sputum.  GASTROINTESTINAL: No anorexia, nausea, vomiting or diarrhea. No abdominal pain or blood.  GENITOURINARY: No burning on urination, no polyuria NEUROLOGICAL: No headache, dizziness, syncope, paralysis, ataxia, numbness or tingling in the extremities. No change in bowel or bladder control.  MUSCULOSKELETAL: No muscle, back pain, joint pain or stiffness.  LYMPHATICS: No enlarged nodes. No history of splenectomy.  PSYCHIATRIC: No history of depression or anxiety.  ENDOCRINOLOGIC: No reports of sweating, cold or heat intolerance. No polyuria or polydipsia.  [name]   Physical Examination Filed Vitals:   12/14/15 1259  BP: 132/76  Pulse: 75   Filed Vitals:   12/14/15 1259  Height: 5\' 10"  (1.778 m)  Weight: 204 lb (92.534 kg)    Gen: resting comfortably, no acute distress HEENT: no scleral icterus, pupils equal round and reactive, no palptable cervical adenopathy,  CV: RRR, no m/r/g, no jvd Resp: Clear to auscultation bilaterally GI: abdomen is soft, non-tender, non-distended, normal bowel sounds, no  hepatosplenomegaly MSK: extremities are warm, no edema.  Skin: warm, no rash Neuro:  no focal deficits Psych: appropriate affect   Diagnostic Studies 05/2015 echo Study Conclusions  - Left ventricle: The cavity size was normal. Wall thickness was increased in a pattern of mild LVH. Systolic function was normal. The estimated ejection fraction was in the range of 60% to 65%. Frequent ventricular ectopy prohibts evaluation of diastolic function. Wall motion was normal; there were no regional wall motion abnormalities. - Aortic valve: Mildly calcified annulus. Trileaflet; mildly thickened leaflets. Valve area (VTI): 2.84 cm^2. Valve area (Vmax): 2.63 cm^2. - Mitral valve: Mildly calcified annulus. Mildly thickened leaflets . -  Left atrium: The atrium was mildly dilated. - Frequent PVCs are noted during the exam, correlate clinically. - Technicallya adequate study.  06/2015 Holter monitor  Predominant rhythm is normal sinus  Occasional PACs, no sustained arrhythmias  Occasional PVCs in singles and coupletes, no NSVT or sustained ventricular arrhythmias  No diary submitted  No arrhythmias. Supraventricular and ventricular ectopy without significant arrhythmia.  07/2015 Lexiscan  There was no ST segment deviation noted during stress after Lexiscan injection  The study is normal. There are no defect consistent with ischemia or infarct  This is a low risk study.  Nuclear stress EF: 50%.  10/2015 Holter 24-hour Holter monitor reviewed. Sinus rhythm and sinus tachycardia noted with heart rate range from 55 bpm up to 103 bpm. There were occasional to frequent PVCs, some ventricular bigeminy and trigeminy, rare couplets. There were no sustained ventricular arrhythmias. PVC burden was approximately 3% of total beats. PACs also noted, approximately 1% of total beats.  12/14/15 Clinic EKG (performed and reviewed in clinic) Assessment and Plan   1. PVCs - no  evidence of structural heart disease. PVC burden significantly reduced on flecanide EKG in clinic today shows NSR with no PVCs - continue current meds  2. PE - continue anticoagulation  3. OSA - will refer to Dr Luan Pulling, this may be contributing to his generalized fatigue.   4. SOB - no cardiac pathology, remains deconditioned. We will refer him to pulmonary rehab.    Arnoldo Lenis, M.D.   01/15/16 addendum From cardiac standpoint patient is ok for foot surgery. Would recommend bridging his coumadin with lovenox, once surgery is scheduled please notify our office and we can arrange.  Zandra Abts MD

## 2015-12-14 NOTE — Patient Instructions (Signed)
Your physician wants you to follow-up in: 4 months with Dr Bryna Colander will receive a reminder letter in the mail two months in advance. If you don't receive a letter, please call our office to schedule the follow-up appointment.  Your physician recommends that you continue on your current medications as directed. Please refer to the Current Medication list given to you today.    You have been referred to Dr Luan Pulling for evaluation of sleep apnea       Thank you for choosing [facility] !

## 2015-12-15 ENCOUNTER — Ambulatory Visit: Payer: Medicare Other | Admitting: Cardiology

## 2015-12-20 ENCOUNTER — Ambulatory Visit (INDEPENDENT_AMBULATORY_CARE_PROVIDER_SITE_OTHER): Payer: Medicare Other | Admitting: *Deleted

## 2015-12-20 ENCOUNTER — Telehealth: Payer: Self-pay

## 2015-12-20 DIAGNOSIS — I2782 Chronic pulmonary embolism: Secondary | ICD-10-CM | POA: Diagnosis not present

## 2015-12-20 DIAGNOSIS — I82493 Acute embolism and thrombosis of other specified deep vein of lower extremity, bilateral: Secondary | ICD-10-CM

## 2015-12-20 DIAGNOSIS — Z5181 Encounter for therapeutic drug level monitoring: Secondary | ICD-10-CM | POA: Diagnosis not present

## 2015-12-20 LAB — POCT INR: INR: 1.7

## 2015-12-20 NOTE — Telephone Encounter (Signed)
See 12/08/15 results message and f/u plan.  If he had additional labs since my last visit here, then I haven't seen any results. Who ordered them?

## 2015-12-20 NOTE — Telephone Encounter (Signed)
[name] called about his labs today can you look over and advise?

## 2015-12-21 NOTE — Telephone Encounter (Signed)
pts wife is aware of all recommendations and results

## 2015-12-26 DIAGNOSIS — E119 Type 2 diabetes mellitus without complications: Secondary | ICD-10-CM | POA: Diagnosis not present

## 2015-12-26 LAB — HM DIABETES EYE EXAM

## 2015-12-27 ENCOUNTER — Encounter (HOSPITAL_COMMUNITY)
Admission: RE | Admit: 2015-12-27 | Discharge: 2015-12-27 | Disposition: A | Discharge status: Home / Self Care | Payer: Medicare Other | Source: Ambulatory Visit | Attending: Cardiology | Admitting: Cardiology

## 2015-12-27 ENCOUNTER — Other Ambulatory Visit: Payer: Self-pay | Admitting: Medical

## 2015-12-27 ENCOUNTER — Encounter (HOSPITAL_COMMUNITY): Payer: Self-pay

## 2015-12-27 VITALS — BP 150/72 | HR 83 | Ht 71.0 in | Wt 203.4 lb

## 2015-12-27 DIAGNOSIS — Z7901 Long term (current) use of anticoagulants: Secondary | ICD-10-CM | POA: Insufficient documentation

## 2015-12-27 DIAGNOSIS — R0602 Shortness of breath: Secondary | ICD-10-CM

## 2015-12-27 DIAGNOSIS — E7800 Pure hypercholesterolemia, unspecified: Secondary | ICD-10-CM | POA: Diagnosis not present

## 2015-12-27 DIAGNOSIS — Z79899 Other long term (current) drug therapy: Secondary | ICD-10-CM | POA: Diagnosis not present

## 2015-12-27 DIAGNOSIS — G4733 Obstructive sleep apnea (adult) (pediatric): Secondary | ICD-10-CM | POA: Diagnosis not present

## 2015-12-27 DIAGNOSIS — Z86711 Personal history of pulmonary embolism: Secondary | ICD-10-CM | POA: Insufficient documentation

## 2015-12-27 DIAGNOSIS — M549 Dorsalgia, unspecified: Secondary | ICD-10-CM | POA: Insufficient documentation

## 2015-12-27 DIAGNOSIS — G8929 Other chronic pain: Secondary | ICD-10-CM | POA: Insufficient documentation

## 2015-12-27 DIAGNOSIS — I10 Essential (primary) hypertension: Secondary | ICD-10-CM | POA: Diagnosis not present

## 2015-12-27 NOTE — Progress Notes (Signed)
Patient arrived for 1st visit/orientation/education at 1320. Patient was referred to CR by Dr. Harl Bowie due to [facility]). During orientation advised patient on arrival and appointment times what to wear, what to do before, during and after exercise. Reviewed attendance and class policy. Talked about inclement weather and class consultation policy. Pt is scheduled to return Cardiac Rehab on 01/02/16 at 1330. Pt was advised to come to class 5 minutes before class starts. He was also given instructions on meeting with the dietician and attending the Family Structure classes. Pt does not seem to be eager or motivated about rehab.  Entrance PHQ9 score is 12.  Patient refused counseling.  Patient was able to complete 6 minute walk test with w/c assistance. Patient was measured for the equipment. Discussed equipment safety with patient. Took patient pre-anthropometric measurements. Patient finished visit at 1450.

## 2015-12-27 NOTE — Patient Instructions (Signed)
Pt has finished orientation and is scheduled to return to CR on 01/02/16 at 1330. Pt has been instructed to arrive to class 15 minutes early for scheduled class. Pt has been instructed to wear comfortable clothing and shoes with rubber soles. Pt has been told to take their medications 1 hour prior to coming to class.  If the patient is not going to attend class, he has been instructed to call.

## 2015-12-27 NOTE — Progress Notes (Signed)
Cardiac/Pulmonary Rehab Medication Review by a Pharmacist  Does the patient  feel that his/her medications are working for him/her?  yes  Has the patient been experiencing any side effects to the medications prescribed?  no  Does the patient measure his/her own blood pressure or blood glucose at home?  yes   Does the patient have any problems obtaining medications due to transportation or finances?   no  Understanding of regimen: good Understanding of indications: good Potential of compliance: good   Pharmacist comments: Patient carries list of home meds. Updated Coumadin on  Profile.Followed in Coumadin clinic for monitoring.  Concerned with information that he receives from different physicians to stop and then restart medication such as pravastatin and celcoxib. He indicated that he doesn't sleep well because he has a lot of pain.  Monitors blood sugar daily. Encouraged to monitor heart rate and blood pressure daily as well. He has little energy and concerned with his overall health that has declined. He said he has lost 45 lbs over the last 2 years. Reviewed all indications and reasons for taking each medication.   Thanks for allowing me to participate in the care of this patient.  Isac Sarna, BS Pharm D, [place] Clinical Pharmacist Pager [phone] 12/27/2015 2:04 PM

## 2015-12-31 ENCOUNTER — Other Ambulatory Visit: Payer: Self-pay | Admitting: Medical

## 2016-01-01 NOTE — Telephone Encounter (Signed)
Is this ok to refill?  

## 2016-01-02 ENCOUNTER — Ambulatory Visit (INDEPENDENT_AMBULATORY_CARE_PROVIDER_SITE_OTHER): Payer: Medicare Other | Admitting: *Deleted

## 2016-01-02 ENCOUNTER — Encounter (HOSPITAL_COMMUNITY)
Admission: RE | Admit: 2016-01-02 | Discharge: 2016-01-02 | Disposition: A | Discharge status: Home / Self Care | Payer: Medicare Other | Source: Ambulatory Visit | Attending: Cardiology | Admitting: Cardiology

## 2016-01-02 ENCOUNTER — Encounter: Payer: Medicare Other | Admitting: Adult Health

## 2016-01-02 DIAGNOSIS — Z5181 Encounter for therapeutic drug level monitoring: Secondary | ICD-10-CM

## 2016-01-02 DIAGNOSIS — Z86711 Personal history of pulmonary embolism: Secondary | ICD-10-CM | POA: Diagnosis not present

## 2016-01-02 DIAGNOSIS — Z7901 Long term (current) use of anticoagulants: Secondary | ICD-10-CM | POA: Diagnosis not present

## 2016-01-02 DIAGNOSIS — I82493 Acute embolism and thrombosis of other specified deep vein of lower extremity, bilateral: Secondary | ICD-10-CM | POA: Diagnosis not present

## 2016-01-02 DIAGNOSIS — G4733 Obstructive sleep apnea (adult) (pediatric): Secondary | ICD-10-CM | POA: Diagnosis not present

## 2016-01-02 DIAGNOSIS — I10 Essential (primary) hypertension: Secondary | ICD-10-CM | POA: Diagnosis not present

## 2016-01-02 DIAGNOSIS — I2782 Chronic pulmonary embolism: Secondary | ICD-10-CM

## 2016-01-02 DIAGNOSIS — E7800 Pure hypercholesterolemia, unspecified: Secondary | ICD-10-CM | POA: Diagnosis not present

## 2016-01-02 DIAGNOSIS — G8929 Other chronic pain: Secondary | ICD-10-CM | POA: Diagnosis not present

## 2016-01-02 LAB — POCT INR: INR: 2.2

## 2016-01-02 NOTE — Progress Notes (Signed)
INR 2.2  Will forward to Edrick Oh RN Coumadin nurse

## 2016-01-02 NOTE — Patient Instructions (Signed)
Christian [name] will contact you regarding you Coumadin instructions  Your INR is 2.2     Thank you for choosing [facility] !

## 2016-01-04 ENCOUNTER — Encounter (HOSPITAL_COMMUNITY)
Admission: RE | Admit: 2016-01-04 | Discharge: 2016-01-04 | Disposition: A | Discharge status: Home / Self Care | Payer: Medicare Other | Source: Ambulatory Visit | Attending: Cardiology | Admitting: Cardiology

## 2016-01-04 DIAGNOSIS — E7800 Pure hypercholesterolemia, unspecified: Secondary | ICD-10-CM | POA: Diagnosis not present

## 2016-01-04 DIAGNOSIS — Z7901 Long term (current) use of anticoagulants: Secondary | ICD-10-CM | POA: Diagnosis not present

## 2016-01-04 DIAGNOSIS — I10 Essential (primary) hypertension: Secondary | ICD-10-CM | POA: Diagnosis not present

## 2016-01-04 DIAGNOSIS — G8929 Other chronic pain: Secondary | ICD-10-CM | POA: Diagnosis not present

## 2016-01-04 DIAGNOSIS — G4733 Obstructive sleep apnea (adult) (pediatric): Secondary | ICD-10-CM | POA: Diagnosis not present

## 2016-01-04 DIAGNOSIS — Z86711 Personal history of pulmonary embolism: Secondary | ICD-10-CM | POA: Diagnosis not present

## 2016-01-09 ENCOUNTER — Encounter (HOSPITAL_COMMUNITY): Payer: Medicare Other

## 2016-01-10 ENCOUNTER — Telehealth: Payer: Self-pay

## 2016-01-10 NOTE — Telephone Encounter (Signed)
pts wife called to ask about who his foot surgery was with and was given number to schedule appt   [facility], [place], [place]  Website  Directions  4.08 Google reviews   Doctor  Address: [address] [place] [place], [place] [address]  Phone: [phone]

## 2016-01-11 ENCOUNTER — Telehealth: Payer: Self-pay | Admitting: Cardiology

## 2016-01-11 ENCOUNTER — Encounter (HOSPITAL_COMMUNITY)
Admission: RE | Admit: 2016-01-11 | Discharge: 2016-01-11 | Disposition: A | Discharge status: Home / Self Care | Payer: Medicare Other | Source: Ambulatory Visit | Attending: Cardiology | Admitting: Cardiology

## 2016-01-11 DIAGNOSIS — Z86711 Personal history of pulmonary embolism: Secondary | ICD-10-CM | POA: Diagnosis not present

## 2016-01-11 DIAGNOSIS — G8929 Other chronic pain: Secondary | ICD-10-CM | POA: Diagnosis not present

## 2016-01-11 DIAGNOSIS — I10 Essential (primary) hypertension: Secondary | ICD-10-CM | POA: Diagnosis not present

## 2016-01-11 DIAGNOSIS — Z7901 Long term (current) use of anticoagulants: Secondary | ICD-10-CM | POA: Diagnosis not present

## 2016-01-11 DIAGNOSIS — E7800 Pure hypercholesterolemia, unspecified: Secondary | ICD-10-CM | POA: Diagnosis not present

## 2016-01-11 DIAGNOSIS — G4733 Obstructive sleep apnea (adult) (pediatric): Secondary | ICD-10-CM | POA: Diagnosis not present

## 2016-01-11 NOTE — Telephone Encounter (Signed)
Sam called from the [facility] requesting a surgical clearance for this pt, it can be faxed to [phone]

## 2016-01-14 NOTE — Progress Notes (Signed)
Christian [name], [place] [address]  Prothrombin G20210A mutation, heterozygous, 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  CURRENT THERAPY: Lifelong anticoagulation, currently on Vitamin K Antagonist being switched by cardiology from [place].  INRs are managed by cardiology.  INTERVAL HISTORY: Christian [name] 75 y.o. male returns for followup of heterozygous G-20210-A-Mutation (Prothrombin gene mutation) in the setting of a pulmonary embolism with bilateral DVT on 01/31/2015 with CTA chest demonstrating large volume PE with evidence of R heart strain.   Echo on 02/01/2015 illustrated an EF of 45- [id number], grade 1 diastolic dysfunction, moderate RV dilation, and PAP at 52 mm Hg.  He was treated with Heparin and transition to Xarelto.  Hypercoag panel demonstrated a heterozygosity for G-20210-A mutation (Prothrombin gene mutation). Resolution of DVTs noted on [place] on 04/18/2015.  2 D echo on 06/06/2015 demonstrates an improvement in LVEF. In December 2016, Xarelto was discontinued and changed to Vitamin K antagonist by cardiology due to patient/wife reporting "side effects" of Xarelto.  I personally reviewed and went over laboratory results with the patient.  The results are noted within this dictation.  No role for labs today.  His appointment later on today for an INR check at the Coumadin clinic with cardiology.  Patient denies any signs or symptoms of VTE.  He is tolerating vitamin K antagonist therapy without any problems. He reports that his fatigue continues despite his change in anticoagulation therapy; which was the concern his wife had resulting in a change in therapy.  He denies any blood in the stool, black sticky stools, epistaxis, easy bruisability, hematuria, headaches.   Past Medical History  Diagnosis Date  . High cholesterol   . Hypertension   . OSA on CPAP   . Insomnia   . Pulmonary embolism ([place]) 01/2015  . DVT (deep venous thrombosis)  ([place]) 01/2015  . Diabetes ([place])     type 2  . Chronic back pain   . Spinal stenosis     lumbar  . Leg pain, diffuse   . Cataract   . Tremor     on propranolol  . Wears glasses   . Hearing impaired   . Mild cognitive impairment     sees [facility]  . Memory loss   . Insomnia   . Numbness     fingers, feet, toes  . Leg swelling   . Confusion   . Agitation   . 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 06/14/2015    has PE (pulmonary embolism); Essential hypertension; Hyperlipidemia; SOB (shortness of breath); OSA (obstructive sleep apnea); Mild cognitive impairment; Long-term (current) use of anticoagulants; Dementia; Bradycardia; Diabetes type 2, uncontrolled ([place]); OSA on CPAP; Edema; Leg pain, diffuse; Bilateral low back pain without sciatica; Tremor; Insomnia; Vaccine counseling; Chronic back pain; Wears glasses; Hearing loss; Spinal stenosis of lumbar region; Ptosis; Pulmonary emboli ([place]); Positive depression screening; Advance directive discussed with patient; Prothrombin G20210A mutation, heterozygous, 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; Diabetes type 2, controlled ([place]); Decreased energy; Pulmonary embolism ([place]); DVT (deep venous thrombosis) ([place]); Hammer toe of left foot; Pre-ulcerative calluses; Hypertrophic toenail; Diabetic mononeuropathy associated with diabetes mellitus due to underlying condition ([place]); Leg pain, bilateral; Chronic low back pain; PVC (premature ventricular contraction); Encounter for health maintenance examination in adult; Shoulder pain, bilateral; Muscle weakness; Generalized weakness; Skin lesion; and Encounter for therapeutic drug monitoring on his problem list.  is allergic to bee venom; oxycodone; and oxycontin.  Current Outpatient Prescriptions on File Prior to Visit  Medication Sig Dispense Refill  . celecoxib (CELEBREX) 100 MG capsule Take 100 mg by mouth 2 (two) times daily.    .  cyclobenzaprine (FLEXERIL) 10 MG tablet TAKE 1 TABLET BY MOUTH AT BEDTIME 30 tablet 0  . diltiazem (CARDIZEM) 30 MG tablet Take 30 mg by mouth 2 (two) times daily.    [name] donepezil (ARICEPT) 10 MG tablet TAKE ONE TABLET AT BEDTIME. 30 tablet 3  . Dulaglutide (TRULICITY) 1.5 0000000 SOPN Inject 1.5 mg into the skin once a week. 2 mL 5  . EPINEPHrine (EPIPEN 2-PAK) 0.3 mg/0.3 mL IJ SOAJ injection Inject 0.3 mLs (0.3 mg total) into the muscle once. 1 Device 0  . flecainide (TAMBOCOR) 150 MG tablet Take 0.5 tablets (75 mg total) by mouth 2 (two) times daily. 45 tablet 3  . HYDROcodone-acetaminophen (NORCO/VICODIN) 5-325 MG tablet 1 tablet 1-2 times daily prn pain 60 tablet 0  . INVOKAMET [id number] MG TABS TAKE 1 TABLET BY MOUTH TWICE DAILY 60 tablet 2  . iron polysaccharides (NIFEREX) 150 MG capsule Take 1 capsule (150 mg total) by mouth daily. 30 capsule 6  . Misc Natural Products (OSTEO BI-FLEX ADV JOINT SHIELD PO) Take 1 tablet by mouth daily.     . pravastatin (PRAVACHOL) 20 MG tablet Take 1 tablet (20 mg total) by mouth daily. 90 tablet 3  . traMADol (ULTRAM) 50 MG tablet Take 1 tablet (50 mg total) by mouth 2 (two) times daily. 180 tablet 0  . warfarin (COUMADIN) 5 MG tablet Take 1 tablet (5 mg total) by mouth daily at 6 PM. (Patient taking differently: Take 5 mg by mouth daily at 6 PM. ) 45 tablet 3   No current facility-administered medications on file prior to visit.    Past Surgical History  Procedure Laterality Date  . Tonsillectomy    . Cataract extraction    . Colonoscopy  2014    Denies any headaches, dizziness, double vision, fevers, chills, night sweats, nausea, vomiting, diarrhea, constipation, chest pain, heart palpitations, shortness of breath, blood in stool, black tarry stool, urinary pain, urinary burning, urinary frequency, hematuria.   PHYSICAL EXAMINATION  ECOG PERFORMANCE STATUS: 1 - Symptomatic but completely ambulatory  Filed Vitals:   01/15/16 1404  BP: 138/65    Pulse: 73  Temp: 98.2 F (36.8 C)  Resp: 18    GENERAL:alert, no distress, well nourished, well developed, comfortable, cooperative, obese, smiling and unaccompanied SKIN: skin color, texture, turgor are normal, no rashes or significant lesions HEAD: Normocephalic, No masses, lesions, tenderness or abnormalities EYES: normal, PERRLA, EOMI, Conjunctiva are pink and non-injected EARS: External ears normal OROPHARYNX:lips, buccal mucosa, and tongue normal and mucous membranes are moist  NECK: supple, no adenopathy, thyroid normal size, non-tender, without nodularity, no stridor, non-tender, trachea midline LYMPH:  no palpable lymphadenopathy, no hepatosplenomegaly BREAST:not examined LUNGS: clear to auscultation and percussion HEART: irregularly irregular and PVCs noted, HR apically on auscultation is 73 ABDOMEN:abdomen soft, non-tender and normal bowel sounds BACK: Back symmetric, no curvature., No CVA tenderness EXTREMITIES:less then 2 second capillary refill, no joint deformities, effusion, or inflammation, no edema, no skin discoloration, no clubbing, no cyanosis, negative Homan's sign bilaterally. NEURO: alert & oriented x 3 with fluent speech, no focal motor/sensory deficits, gait normal    LABORATORY DATA: CBC    Component Value Date/Time   WBC 6.0 12/08/2015 1445   RBC 5.14 12/08/2015 1445  HGB 14.9 12/08/2015 1445   HCT 45.3 12/08/2015 1445   PLT 296 12/08/2015 1445   MCV 88.1 12/08/2015 1445   MCH 29.0 12/08/2015 1445   MCHC 32.9 12/08/2015 1445   RDW 12.6 12/08/2015 1445   LYMPHSABS 1.1 09/14/2015 1243   MONOABS 0.6 09/14/2015 1243   EOSABS 0.1 09/14/2015 1243   BASOSABS 0.1 09/14/2015 1243      Chemistry      Component Value Date/Time   NA 138 12/08/2015 1445   K 4.8 12/08/2015 1445   CL 100 12/08/2015 1445   CO2 26 12/08/2015 1445   BUN 13 12/08/2015 1445   CREATININE 0.69* 12/08/2015 1445   CREATININE 0.90 07/11/2015 1744      Component Value  Date/Time   CALCIUM 9.4 12/08/2015 1445   ALKPHOS 90 10/30/2015 0001   AST 18 10/30/2015 0001   ALT 14 10/30/2015 0001   BILITOT 0.6 10/30/2015 0001     Lab Results  Component Value Date   INR 1.7 01/15/2016   INR 2.2 01/02/2016   INR 1.7 12/20/2015     PENDING LABS:   RADIOGRAPHIC STUDIES:  No results found.   PATHOLOGY:    ASSESSMENT AND PLAN:  Prothrombin G20210A mutation, heterozygous, 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 Pulmonary embolism with bilateral DVT on 01/31/2015 with CTA chest demonstrating large volume PE with evidence of R heart strain.   Echo on 02/01/2015 illustrated an EF of 45- A999333, grade 1 diastolic dysfunction, moderate RV dilation, and PAP at 52 mm Hg.  He was treated with Heparin and transition to Xarelto.  Hypercoag panel demonstrates a heterozygosity for G-20210-A mutation (Prothrombin gene mutation). Resolution of DVTs noted on [place] on 04/18/2015.  2 D echo on 06/06/2015 demonstrates an improvement in LVEF.  In December 2016, Xarelto was discontinued and changed to Vitamin K antagonist by cardiology due to patient/wife reporting "side effects" of Xarelto.  Continue Vitamin K antagonist therapy with INRs as directed by cardiology.  Recommend maintaining an INR of 2- 3.5.  No role for hematology labs today. He is scheduled for an INR check after today's appointment.  This is being followed by cardiology.  Return in 12 months for follow-up.   THERAPY PLAN:  Continue with lifelong anticoagulation.  Compliance encouraged with Coumadin and scheduled INRs.  All questions were answered. The patient knows to call the clinic with any problems, questions or concerns. We can certainly see the patient much sooner if necessary.  Patient and plan discussed with Dr. Ancil Linsey and she is in agreement with the aforementioned.   This note is electronically signed by: Doy Mince 01/15/2016 6:20 PM

## 2016-01-14 NOTE — Assessment & Plan Note (Addendum)
Pulmonary embolism with bilateral DVT on 01/31/2015 with CTA chest demonstrating large volume PE with evidence of R heart strain.   Echo on 02/01/2015 illustrated an EF of 45- A999333, grade 1 diastolic dysfunction, moderate RV dilation, and PAP at 52 mm Hg.  He was treated with Heparin and transition to Xarelto.  Hypercoag panel demonstrates a heterozygosity for G-20210-A mutation (Prothrombin gene mutation). Resolution of DVTs noted on [place] on 04/18/2015.  2 D echo on 06/06/2015 demonstrates an improvement in LVEF.  In December 2016, Xarelto was discontinued and changed to Vitamin K antagonist by cardiology due to patient/wife reporting "side effects" of Xarelto.  Continue Vitamin K antagonist therapy with INRs as directed by cardiology.  Recommend maintaining an INR of 2- 3.5.  No role for hematology labs today. He is scheduled for an INR check after today's appointment.  This is being followed by cardiology.  Return in 12 months for follow-up.

## 2016-01-15 ENCOUNTER — Ambulatory Visit (INDEPENDENT_AMBULATORY_CARE_PROVIDER_SITE_OTHER): Payer: Medicare Other | Admitting: *Deleted

## 2016-01-15 ENCOUNTER — Encounter (HOSPITAL_COMMUNITY): Payer: Self-pay | Admitting: Oncology

## 2016-01-15 ENCOUNTER — Other Ambulatory Visit: Payer: Self-pay | Admitting: Neurology

## 2016-01-15 ENCOUNTER — Encounter (HOSPITAL_BASED_OUTPATIENT_CLINIC_OR_DEPARTMENT_OTHER): Payer: Medicare Other | Admitting: Oncology

## 2016-01-15 VITALS — BP 138/65 | HR 73 | Temp 98.2°F | Resp 18 | Wt 205.0 lb

## 2016-01-15 DIAGNOSIS — I2782 Chronic pulmonary embolism: Secondary | ICD-10-CM | POA: Diagnosis not present

## 2016-01-15 DIAGNOSIS — Z5181 Encounter for therapeutic drug level monitoring: Secondary | ICD-10-CM

## 2016-01-15 DIAGNOSIS — D6852 Prothrombin gene mutation: Secondary | ICD-10-CM

## 2016-01-15 DIAGNOSIS — I82493 Acute embolism and thrombosis of other specified deep vein of lower extremity, bilateral: Secondary | ICD-10-CM | POA: Diagnosis not present

## 2016-01-15 LAB — POCT INR: INR: 1.7

## 2016-01-15 NOTE — Telephone Encounter (Signed)
Faxed to [facility],forward to Edrick Oh RN

## 2016-01-15 NOTE — Patient Instructions (Addendum)
[facility] at [facility] Discharge Instructions  RECOMMENDATIONS MADE BY THE CONSULTANT AND ANY TEST RESULTS WILL BE SENT TO YOUR REFERRING PHYSICIAN.  You have an appointment today for an INR check (Coumadin level check).  After leaving the [facility], you are to report to the Coumadin clinic to have this lab value checked. Continue with Coumadin as directed by cardiology. Return in 12 months for follow-up.  Please report to ED with any signs and symptoms of blood clot.  Thank you for choosing [facility] at [facility] to provide your oncology and hematology care.  To afford each patient quality time with our provider, please arrive at least 15 minutes before your scheduled appointment time.   Beginning January 23rd 2017 lab work for the [facility] will be done in the  Main lab at [facility] on 1st floor. If you have a lab appointment with the [facility] please come in thru the  Main Entrance and check in at the main information desk  You need to re-schedule your appointment should you arrive 10 or more minutes late.  We strive to give you quality time with our providers, and arriving late affects you and other patients whose appointments are after yours.  Also, if you no show three or more times for appointments you may be dismissed from the clinic at the providers discretion.     Again, thank you for choosing [facility].  Our hope is that these requests will decrease the amount of time that you wait before being seen by our physicians.       _____________________________________________________________  Should you have questions after your visit to [facility], please contact our office at [phone] between the hours of 8:30 a.m. and 4:30 p.m.  Voicemails left after 4:30 p.m. will not be returned until the following business day.  For prescription refill requests, have your pharmacy contact our  office.

## 2016-01-15 NOTE — Telephone Encounter (Signed)
Please forward my last clinic note with addendum to his foot surgeon. Once he has a date we will need to be made aware, his coumadin will need to be bridged   Zandra Abts MD

## 2016-01-16 ENCOUNTER — Encounter (HOSPITAL_COMMUNITY): Payer: Medicare Other

## 2016-01-16 DIAGNOSIS — I10 Essential (primary) hypertension: Secondary | ICD-10-CM | POA: Diagnosis not present

## 2016-01-16 DIAGNOSIS — E1165 Type 2 diabetes mellitus with hyperglycemia: Secondary | ICD-10-CM | POA: Diagnosis not present

## 2016-01-16 DIAGNOSIS — G4730 Sleep apnea, unspecified: Secondary | ICD-10-CM | POA: Diagnosis not present

## 2016-01-17 NOTE — Progress Notes (Signed)
Pulmonary Rehabilitation Program Outcomes Report   Orientation:  12/27/15 Graduate Date:  tbd Discharge Date:  tbd # of sessions completed: 3  Pulmonologist: [place] Family MD:  Tysinger Class Time:  1330  A.  Exercise Program:  Tolerates exercise @ 3.63 METS for 15 minutes and Walk Test Results:  Pre: 2.17 mets  B.  Mental Health:  Good mental attitude and PHQ-9: 12. Patient refused counseling  C.  Education/Instruction/Skills  Uses Perceived Exertion Scale and/or Dyspnea Scale  Demonstrates accurate pursed lip breathing  D.  Nutrition/Weight Control/Body Composition:  Adherence to prescribed nutrition program: fair    E.  Blood Lipids    Lab Results  Component Value Date   CHOL 212* 10/30/2015   HDL 39* 10/30/2015   LDLCALC 151* 10/30/2015   TRIG 108 10/30/2015   CHOLHDL 5.4* 10/30/2015    F.  Lifestyle Changes:  Making positive lifestyle changes and Not smoking:  Quit 2006  G.  Symptoms noted with exercise:  Asymptomatic  Report Completed By:  Stevphen Rochester RN   Comments:  This is the patients first week progress note for [facility].

## 2016-01-18 ENCOUNTER — Telehealth: Payer: Self-pay | Admitting: Family Medicine

## 2016-01-18 ENCOUNTER — Encounter (HOSPITAL_COMMUNITY)
Admission: RE | Admit: 2016-01-18 | Discharge: 2016-01-18 | Disposition: A | Discharge status: Home / Self Care | Payer: Medicare Other | Source: Ambulatory Visit | Attending: Cardiology | Admitting: Cardiology

## 2016-01-18 DIAGNOSIS — G4733 Obstructive sleep apnea (adult) (pediatric): Secondary | ICD-10-CM | POA: Diagnosis not present

## 2016-01-18 DIAGNOSIS — Z7901 Long term (current) use of anticoagulants: Secondary | ICD-10-CM | POA: Diagnosis not present

## 2016-01-18 DIAGNOSIS — E7800 Pure hypercholesterolemia, unspecified: Secondary | ICD-10-CM | POA: Diagnosis not present

## 2016-01-18 DIAGNOSIS — Z86711 Personal history of pulmonary embolism: Secondary | ICD-10-CM | POA: Diagnosis not present

## 2016-01-18 DIAGNOSIS — G8929 Other chronic pain: Secondary | ICD-10-CM | POA: Diagnosis not present

## 2016-01-18 DIAGNOSIS — I10 Essential (primary) hypertension: Secondary | ICD-10-CM | POA: Diagnosis not present

## 2016-01-18 MED ORDER — GLUCOSE BLOOD VI STRP: ORAL_STRIP | Status: DC

## 2016-01-18 NOTE — Telephone Encounter (Signed)
Pt called said been out of strips for over 1 week.  He's been trying to get [facility] to order Rx from [place].  I advised we have had no request from [facility].  [name] advised ok to order.

## 2016-01-22 DIAGNOSIS — M2042 Other hammer toe(s) (acquired), left foot: Secondary | ICD-10-CM | POA: Diagnosis not present

## 2016-01-22 DIAGNOSIS — M24575 Contracture, left foot: Secondary | ICD-10-CM | POA: Diagnosis not present

## 2016-01-22 DIAGNOSIS — E119 Type 2 diabetes mellitus without complications: Secondary | ICD-10-CM | POA: Diagnosis not present

## 2016-01-22 DIAGNOSIS — M71572 Other bursitis, not elsewhere classified, left ankle and foot: Secondary | ICD-10-CM | POA: Diagnosis not present

## 2016-01-22 DIAGNOSIS — Z79899 Other long term (current) drug therapy: Secondary | ICD-10-CM | POA: Diagnosis not present

## 2016-01-23 ENCOUNTER — Encounter (HOSPITAL_COMMUNITY): Payer: Medicare Other

## 2016-01-23 ENCOUNTER — Encounter: Payer: Self-pay | Admitting: Neurology

## 2016-01-23 ENCOUNTER — Ambulatory Visit (INDEPENDENT_AMBULATORY_CARE_PROVIDER_SITE_OTHER): Payer: Medicare Other | Admitting: Neurology

## 2016-01-23 VITALS — BP 102/66 | HR 72 | Ht 70.0 in | Wt 209.0 lb

## 2016-01-23 DIAGNOSIS — G3184 Mild cognitive impairment, so stated: Secondary | ICD-10-CM
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
## 2016-01-23 NOTE — Addendum Note (Signed)
Addended by: Lendon Colonel on: 01/23/2016 03:41 PM   Modules accepted: Level of Service

## 2016-01-23 NOTE — Patient Instructions (Signed)
I had a long discussion with the patient and his wife regarding his mild cognitive impairment which appears to be stable. He was advised to discuss with his cardiologist whether he could switch from warfarin to newer anticoagulants since he is having trouble regulating INR.Christian [name] He was advised to increase participation in cognitively challenging task like playing bridge, solving crossword puzzles for sudoku. Greater than 50% time during this 25 minute visit was spent on counseling and coordination of care. He will return for follow-up in 6 months with my nurse practitioner or call earlier if necessary  Management of Memory Problems  There are some general things you can do to help manage your memory problems.  Your memory may not in fact recover, but by using techniques and strategies you will be able to manage your memory difficulties better.  1)  Establish a routine.  Try to establish and then stick to a regular routine.  By doing this, you will get used to what to expect and you will reduce the need to rely on your memory.  Also, try to do things at the same time of day, such as taking your medication or checking your calendar first thing in the morning.  Think about think that you can do as a part of a regular routine and make a list.  Then enter them into a daily planner to remind you.  This will help you establish a routine.  2)  Organize your environment.  Organize your environment so that it is uncluttered.  Decrease visual stimulation.  Place everyday items such as keys or cell phone in the same place every day (ie.  Basket next to front door)  Use post it notes with a brief message to yourself (ie. Turn off light, lock the door)  Use labels to indicate where things go (ie. Which cupboards are for food, dishes, etc.)  Keep a notepad and pen by the telephone to take messages  3)  Memory Aids  A diary or journal/notebook/daily planner  Making a list (shopping list, chore list, to do list  that needs to be done)  Using an alarm as a reminder ([name] timer or cell phone alarm)  Using cell phone to store information (Notes, Calendar, Reminders)  Calendar/White board placed in a prominent position  Post-it notes  In order for memory aids to be useful, you need to have good habits.  It's no good remembering to make a note in your journal if you don't remember to look in it.  Try setting aside a certain time of day to look in journal.  4)  Improving mood and managing fatigue.  There may be other factors that contribute to memory difficulties.  Factors, such as anxiety, depression and tiredness can affect memory.  Regular gentle exercise can help improve your mood and give you more energy.  Simple relaxation techniques may help relieve symptoms of anxiety  Try to get back to completing activities or hobbies you enjoyed doing in the past.  Learn to pace yourself through activities to decrease fatigue.  Find out about some local support groups where you can share experiences with others.  Try and achieve 7-8 hours of sleep at night.

## 2016-01-25 ENCOUNTER — Telehealth: Payer: Self-pay | Admitting: Cardiology

## 2016-01-25 ENCOUNTER — Encounter (HOSPITAL_COMMUNITY)
Admission: RE | Admit: 2016-01-25 | Discharge: 2016-01-25 | Disposition: A | Discharge status: Home / Self Care | Payer: Medicare Other | Source: Ambulatory Visit | Attending: Cardiology | Admitting: Cardiology

## 2016-01-25 DIAGNOSIS — I10 Essential (primary) hypertension: Secondary | ICD-10-CM | POA: Insufficient documentation

## 2016-01-25 DIAGNOSIS — G4733 Obstructive sleep apnea (adult) (pediatric): Secondary | ICD-10-CM | POA: Insufficient documentation

## 2016-01-25 DIAGNOSIS — Z7901 Long term (current) use of anticoagulants: Secondary | ICD-10-CM | POA: Diagnosis not present

## 2016-01-25 DIAGNOSIS — E7800 Pure hypercholesterolemia, unspecified: Secondary | ICD-10-CM | POA: Insufficient documentation

## 2016-01-25 DIAGNOSIS — G8929 Other chronic pain: Secondary | ICD-10-CM | POA: Diagnosis not present

## 2016-01-25 DIAGNOSIS — M549 Dorsalgia, unspecified: Secondary | ICD-10-CM | POA: Diagnosis not present

## 2016-01-25 DIAGNOSIS — Z79899 Other long term (current) drug therapy: Secondary | ICD-10-CM | POA: Diagnosis not present

## 2016-01-25 DIAGNOSIS — Z86711 Personal history of pulmonary embolism: Secondary | ICD-10-CM | POA: Diagnosis not present

## 2016-01-25 NOTE — Telephone Encounter (Signed)
Will FYI Dr Branch 

## 2016-01-25 NOTE — Telephone Encounter (Signed)
Christian [name], can you bridge his coumadin with lovenos for his surgery on 02/06/16   Zandra Abts MD

## 2016-01-25 NOTE — Telephone Encounter (Signed)
Pt is having surgery on 3/14

## 2016-01-25 NOTE — Telephone Encounter (Signed)
Will forward to [employer]

## 2016-01-25 NOTE — Telephone Encounter (Signed)
Already scheduled to see me 3/6 for bridging instructions. Thanks

## 2016-01-29 ENCOUNTER — Ambulatory Visit (INDEPENDENT_AMBULATORY_CARE_PROVIDER_SITE_OTHER): Payer: Medicare Other | Admitting: *Deleted

## 2016-01-29 ENCOUNTER — Emergency Department (HOSPITAL_COMMUNITY)
Admission: EM | Admit: 2016-01-29 | Discharge: 2016-01-29 | Disposition: A | Discharge status: Home / Self Care | Payer: Medicare Other | Attending: Emergency Medicine | Admitting: Emergency Medicine

## 2016-01-29 ENCOUNTER — Encounter (HOSPITAL_COMMUNITY): Payer: Self-pay | Admitting: *Deleted

## 2016-01-29 DIAGNOSIS — Y939 Activity, unspecified: Secondary | ICD-10-CM | POA: Insufficient documentation

## 2016-01-29 DIAGNOSIS — Z5181 Encounter for therapeutic drug level monitoring: Secondary | ICD-10-CM

## 2016-01-29 DIAGNOSIS — Z79899 Other long term (current) drug therapy: Secondary | ICD-10-CM | POA: Insufficient documentation

## 2016-01-29 DIAGNOSIS — Z7901 Long term (current) use of anticoagulants: Secondary | ICD-10-CM | POA: Insufficient documentation

## 2016-01-29 DIAGNOSIS — Z7984 Long term (current) use of oral hypoglycemic drugs: Secondary | ICD-10-CM | POA: Insufficient documentation

## 2016-01-29 DIAGNOSIS — Z87891 Personal history of nicotine dependence: Secondary | ICD-10-CM | POA: Insufficient documentation

## 2016-01-29 DIAGNOSIS — Y999 Unspecified external cause status: Secondary | ICD-10-CM | POA: Insufficient documentation

## 2016-01-29 DIAGNOSIS — I82493 Acute embolism and thrombosis of other specified deep vein of lower extremity, bilateral: Secondary | ICD-10-CM | POA: Diagnosis not present

## 2016-01-29 DIAGNOSIS — E782 Mixed hyperlipidemia: Secondary | ICD-10-CM | POA: Insufficient documentation

## 2016-01-29 DIAGNOSIS — W57XXXA Bitten or stung by nonvenomous insect and other nonvenomous arthropods, initial encounter: Secondary | ICD-10-CM | POA: Insufficient documentation

## 2016-01-29 DIAGNOSIS — Y929 Unspecified place or not applicable: Secondary | ICD-10-CM | POA: Diagnosis not present

## 2016-01-29 DIAGNOSIS — I2782 Chronic pulmonary embolism: Secondary | ICD-10-CM | POA: Diagnosis not present

## 2016-01-29 DIAGNOSIS — E119 Type 2 diabetes mellitus without complications: Secondary | ICD-10-CM | POA: Diagnosis not present

## 2016-01-29 DIAGNOSIS — I10 Essential (primary) hypertension: Secondary | ICD-10-CM | POA: Diagnosis not present

## 2016-01-29 DIAGNOSIS — S80262A Insect bite (nonvenomous), left knee, initial encounter: Secondary | ICD-10-CM | POA: Diagnosis not present

## 2016-01-29 LAB — POCT INR: INR: 2.4

## 2016-01-29 MED ORDER — ENOXAPARIN SODIUM 150 MG/ML ~~LOC~~ SOLN: 150.0000 mg | SUBCUTANEOUS | Status: DC

## 2016-01-29 MED ORDER — DOXYCYCLINE HYCLATE 100 MG PO CAPS: 100.0000 mg | ORAL_CAPSULE | Freq: Two times a day (BID) | ORAL | Status: DC

## 2016-01-29 NOTE — Patient Instructions (Addendum)
01/29/16- Take Coumadin 5mg    01/30/16- Take Coumadin 5mg    01/31/16- Take your last dose of coumadin today- 5mg s  02/01/16- Do Nothing  02/02/16- Start Lovenox ( Enoxaparin) 150mg   injection this morning at 10 am- Rotate sites, inject 2 inches away from navel.   02/03/16- Take a Lovenox 150mg  injection at 10am  02/04/16- Take a Lovenox 150mg  injection at 10am   02/05/16- Take your last  Lovenox 150mg  injection at 10am  02/06/16- Day of Surgery, Do Not take any Lovenox injections. If ok with surgeon restart Coumadin that night after surgery. When you restart your Coumadin  take an extra 1/2 tablet along with your normal dosage for 2 days, then resume regular dose of Coumadin   02/07/16- If okay with  Surgeon restart Lovenox 150mg  injection at 10 am. Also take Coumadin dosage.   02/08/16-02/11/16 Continue taking Lovenox 150mg  injection every morning at 10 am.  Continue Coumadin Dosage. You will continue both medications until your follow up appt on 02/12/16 [facility].   02/12/16- Coumadin appt

## 2016-01-29 NOTE — ED Notes (Signed)
Pt pulled a tick off of his left lower thigh several days ago. Pt has minimal redness and swelling to area. Pt states this area itches. Denies any other symptoms.

## 2016-01-29 NOTE — ED Provider Notes (Signed)
CSN: [id number]     Arrival date & time 01/29/16  1442 History  By signing my name below, I, Christian [name], attest that this documentation has been prepared under the direction and in the presence of Lily Kocher, PA-C Electronically Signed: Soijett [name], ED Scribe. 01/29/2016. 4:59 PM.  Chief Complaint  Patient presents with  . Insect Bite      Patient is a 75 y.o. male presenting with animal bite. The history is provided by the patient. No language interpreter was used.  Animal Bite Contact animal:  Insect Location:  Leg Leg injury location:  L upper leg Time since incident:  3 days Pain details:    Quality:  Unable to specify   Severity:  Mild   Progression:  Improving Relieved by: removing the tick. Worsened by:  Nothing tried Ineffective treatments:  None tried Associated symptoms: no fever and no rash     Christian [name] is a 75 y.o. male with a PMHx of HTN, DM, factor 5 clotting factor, who presents to the Emergency Department complaining of an insect bite onset 3 days. Pt pulled a tick off of his left lower thigh 3 days ago and has had minimal redness/swelling since. Pt has concern for if there is still tick remnants in the area. Pt reports that the area is also pruritic at this time. Pt is having surgery next week and prior to him seeing his specialist, they recommended that everything be further evaluated. Pt is having associated symptoms of redness and swelling. Pt reports that he has not tried any medications for the relief of his symptoms. Pt denies fever and any other symptoms.   Past Medical History  Diagnosis Date  . High cholesterol   . Hypertension   . OSA on CPAP   . Insomnia   . Pulmonary embolism ([place]) 01/2015  . DVT (deep venous thrombosis) ([place]) 01/2015  . Diabetes ([place])     type 2  . Chronic back pain   . Spinal stenosis     lumbar  . Leg pain, diffuse   . Cataract   . Tremor     on propranolol  . Wears glasses   . Hearing impaired   . Mild  cognitive impairment     sees [facility]  . Memory loss   . Insomnia   . Numbness     fingers, feet, toes  . Leg swelling   . Confusion   . Agitation   . 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 06/14/2015   Past Surgical History  Procedure Laterality Date  . Tonsillectomy    . Cataract extraction    . Colonoscopy  2014   Family History  Problem Relation Age of Onset  . Dementia Father   . Heart disease Brother   . Clotting disorder Brother   . Psychiatric Illness Sister    Social History  Substance Use Topics  . Smoking status: Former Smoker -- 1.00 packs/day for 10 years    Types: Cigarettes    Quit date: 11/25/2004  . Smokeless tobacco: Never Used  . Alcohol Use: No     Comment: drinks 2 cups of coffee daily    Review of Systems  Constitutional: Negative for fever.  Skin: Positive for color change. Negative for rash and wound.       Bite site to left lower thigh  All other systems reviewed and are negative.     Allergies  Bee venom; Oxycodone;  and Oxycontin  Home Medications   Prior to Admission medications   Medication Sig Start Date End Date Taking? Authorizing Provider  celecoxib (CELEBREX) 100 MG capsule Take 100 mg by mouth 2 (two) times daily.    Historical Provider, MD  cyclobenzaprine (FLEXERIL) 10 MG tablet TAKE 1 TABLET BY MOUTH AT BEDTIME 01/01/16   Camelia Eng Tysinger, PA-C  diltiazem (CARDIZEM) 30 MG tablet Take 30 mg by mouth 2 (two) times daily.    Historical Provider, MD  donepezil (ARICEPT) 10 MG tablet TAKE 1 TABLET BY MOUTH EVERY NIGHT AT BEDTIME 01/16/16   Garvin Fila, MD  Dulaglutide (TRULICITY) 1.5 0000000 SOPN Inject 1.5 mg into the skin once a week. 11/01/15   Camelia Eng Tysinger, PA-C  enoxaparin (LOVENOX) 150 MG/ML injection Inject 1 mL (150 mg total) into the skin daily. 01/29/16   Arnoldo Lenis, MD  EPINEPHrine (EPIPEN 2-PAK) 0.3 mg/0.3 mL IJ SOAJ injection Inject 0.3 mLs (0.3 mg  total) into the muscle once. 03/06/15   Camelia Eng Tysinger, PA-C  flecainide (TAMBOCOR) 150 MG tablet Take 0.5 tablets (75 mg total) by mouth 2 (two) times daily. 11/07/15   Evans Lance, MD  glucose blood (TRUE METRIX BLOOD GLUCOSE TEST) test strip Use as instructed 01/18/16   Camelia Eng Tysinger, PA-C  HYDROcodone-acetaminophen (NORCO/VICODIN) 5-325 MG tablet 1 tablet 1-2 times daily prn pain 10/30/15   Camelia Eng Tysinger, PA-C  INVOKAMET [phone] MG TABS TAKE 1 TABLET BY MOUTH TWICE DAILY 12/27/15   Camelia Eng Tysinger, PA-C  iron polysaccharides (NIFEREX) 150 MG capsule Take 1 capsule (150 mg total) by mouth daily. 10/05/15   Patrici Ranks, MD  Misc Natural Products (OSTEO BI-FLEX ADV JOINT SHIELD PO) Take 1 tablet by mouth daily.     Historical Provider, MD  pravastatin (PRAVACHOL) 20 MG tablet Take 1 tablet (20 mg total) by mouth daily. 12/14/15   Arnoldo Lenis, MD  traMADol (ULTRAM) 50 MG tablet Take 1 tablet (50 mg total) by mouth 2 (two) times daily. 12/08/15   Camelia Eng Tysinger, PA-C  warfarin (COUMADIN) 5 MG tablet Take 1 tablet (5 mg total) by mouth daily at 6 PM. Patient taking differently: Take 5 mg by mouth daily at 6 PM.  11/29/15   Arnoldo Lenis, MD   BP 149/86 mmHg  Pulse 73  Temp(Src) 97.9 F (36.6 C) (Oral)  Resp 16  Ht 5' 10.5" (1.791 m)  Wt 204 lb (92.534 kg)  BMI 28.85 kg/m2  SpO2 98% Physical Exam  Constitutional: He is oriented to person, place, and time. He appears well-developed and well-nourished. No distress.  HENT:  Head: Normocephalic and atraumatic.  Eyes: EOM are normal.  Neck: Neck supple.  Cardiovascular: Normal rate, regular rhythm and normal heart sounds.  Exam reveals no gallop and no friction rub.   No murmur heard. Pulmonary/Chest: Effort normal and breath sounds normal. No respiratory distress. He has no wheezes. He has no rales.  Musculoskeletal: Normal range of motion.  No left knee effusion. Left knee is not hot to touch.  Neurological: He is  alert and oriented to person, place, and time.  Skin: Skin is warm and dry.  Bite site at the posterior aspect of the left knee. Scab present. No red streaking. Area is not hot. No palpable nodes of the posterior knee. Under magnification, there is no foreign body left at the site.   Psychiatric: He has a normal mood and affect. His behavior is normal.  Nursing  note and vitals reviewed.   ED Course  Procedures (including critical care time) DIAGNOSTIC STUDIES: Oxygen Saturation is 98% on RA, nl by my interpretation.    COORDINATION OF CARE: 4:59 PM Discussed treatment plan with pt at bedside which includes doxycycline Rx and pt agreed to plan.    Labs Review Labs Reviewed - No data to display  Imaging Review No results found.    EKG Interpretation None      MDM  Vital signs are well within normal limits. There is no evidence of any foreign body, or removed portions of the tick at the wound site. The patient will be placed on doxycycline prophylactically. Discussed with the patient and the patient's family the need to return if any high fever, unusual rash, nausea vomiting, or deterioration in general condition.    Final diagnoses:  None    **I have reviewed nursing notes, vital signs, and all appropriate lab and imaging results for this patient.*  **I personally performed the services described in this documentation, which was scribed in my presence. The recorded information has been reviewed and is accurate.Lily Kocher, PA-C [id number]  Delora Fuel, MD [id number]

## 2016-01-29 NOTE — Discharge Instructions (Signed)
Please cleanse the wound with soap and water. Apply a band-aid to protect the site. Use doxycycline daily with food. Tick Bite Information Ticks are insects that attach themselves to the skin. There are many types of ticks. Common types include wood ticks and deer ticks. Sometimes, ticks carry diseases that can make a person very ill. The most common places for ticks to attach themselves are the scalp, neck, armpits, waist, and groin.  HOW CAN YOU PREVENT TICK BITES? Take these steps to help prevent tick bites when you are outdoors:  Wear long sleeves and long pants.  Wear white clothes so you can see ticks more easily.  Tuck your pant legs into your socks.  If walking on a trail, stay in the middle of the trail to avoid brushing against bushes.  Avoid walking through areas with long grass.  Put bug spray on all skin that is showing and along boot tops, pant legs, and sleeve cuffs.  Check clothes, hair, and skin often and before going inside.  Brush off any ticks that are not attached.  Take a shower or bath as soon as possible after being outdoors. HOW SHOULD YOU REMOVE A TICK? Ticks should be removed as soon as possible to help prevent diseases. 1. If latex gloves are available, put them on before trying to remove a tick. 2. Use tweezers to grasp the tick as close to the skin as possible. You may also use curved forceps or a tick removal tool. Grasp the tick as close to its head as possible. Avoid grasping the tick on its body. 3. Pull gently upward until the tick lets go. Do not twist the tick or jerk it suddenly. This may break off the tick's head or mouth parts. 4. Do not squeeze or crush the tick's body. This could force disease-carrying fluids from the tick into your body. 5. After the tick is removed, wash the bite area and your hands with soap and water or alcohol. 6. Apply a small amount of antiseptic cream or ointment to the bite site. 7. Wash any tools that were used. Do  not try to remove a tick by applying a hot match, petroleum jelly, or fingernail polish to the tick. These methods do not work. They may also increase the chances of disease being spread from the tick bite. WHEN SHOULD YOU SEEK HELP? Contact your health care provider if you are unable to remove a tick or if a part of the tick breaks off in the skin. After a tick bite, you need to watch for signs and symptoms of diseases that can be spread by ticks. Contact your health care provider if you develop any of the following:  Fever.  Rash.  Redness and puffiness (swelling) in the area of the tick bite.  Tender, puffy lymph glands.  Watery poop (diarrhea).  Weight loss.  Cough.  Feeling more tired than normal (fatigue).  Muscle, joint, or bone pain.  Belly (abdominal) pain.  Headache.  Change in your level of consciousness.  Trouble walking or moving your legs.  Loss of feeling (numbness) in the legs.  Loss of movement (paralysis).  Shortness of breath.  Confusion.  Throwing up (vomiting) many times.   This information is not intended to replace advice given to you by your health care provider. Make sure you discuss any questions you have with your health care provider.   Document Released: 02/05/2010 Document Revised: 07/14/2013 Document Reviewed: 04/21/2013 Elsevier Interactive Patient Education [employer].

## 2016-01-30 ENCOUNTER — Encounter (HOSPITAL_COMMUNITY)
Admission: RE | Admit: 2016-01-30 | Discharge: 2016-01-30 | Disposition: A | Discharge status: Home / Self Care | Payer: Medicare Other | Source: Ambulatory Visit | Attending: Cardiology | Admitting: Cardiology

## 2016-01-30 DIAGNOSIS — G8929 Other chronic pain: Secondary | ICD-10-CM | POA: Diagnosis not present

## 2016-01-30 DIAGNOSIS — I10 Essential (primary) hypertension: Secondary | ICD-10-CM | POA: Diagnosis not present

## 2016-01-30 DIAGNOSIS — E7800 Pure hypercholesterolemia, unspecified: Secondary | ICD-10-CM | POA: Diagnosis not present

## 2016-01-30 DIAGNOSIS — Z7901 Long term (current) use of anticoagulants: Secondary | ICD-10-CM | POA: Diagnosis not present

## 2016-01-30 DIAGNOSIS — G4733 Obstructive sleep apnea (adult) (pediatric): Secondary | ICD-10-CM | POA: Diagnosis not present

## 2016-01-30 DIAGNOSIS — Z86711 Personal history of pulmonary embolism: Secondary | ICD-10-CM | POA: Diagnosis not present

## 2016-02-01 ENCOUNTER — Encounter (HOSPITAL_COMMUNITY)
Admission: RE | Admit: 2016-02-01 | Discharge: 2016-02-01 | Disposition: A | Discharge status: Home / Self Care | Payer: Medicare Other | Source: Ambulatory Visit | Attending: Cardiology | Admitting: Cardiology

## 2016-02-01 ENCOUNTER — Other Ambulatory Visit (HOSPITAL_COMMUNITY): Payer: Self-pay | Admitting: Respiratory Therapy

## 2016-02-01 DIAGNOSIS — G4730 Sleep apnea, unspecified: Secondary | ICD-10-CM

## 2016-02-01 DIAGNOSIS — E7800 Pure hypercholesterolemia, unspecified: Secondary | ICD-10-CM | POA: Diagnosis not present

## 2016-02-01 DIAGNOSIS — G4733 Obstructive sleep apnea (adult) (pediatric): Secondary | ICD-10-CM | POA: Diagnosis not present

## 2016-02-01 DIAGNOSIS — Z7901 Long term (current) use of anticoagulants: Secondary | ICD-10-CM | POA: Diagnosis not present

## 2016-02-01 DIAGNOSIS — G8929 Other chronic pain: Secondary | ICD-10-CM | POA: Diagnosis not present

## 2016-02-01 DIAGNOSIS — Z86711 Personal history of pulmonary embolism: Secondary | ICD-10-CM | POA: Diagnosis not present

## 2016-02-01 DIAGNOSIS — I10 Essential (primary) hypertension: Secondary | ICD-10-CM | POA: Diagnosis not present

## 2016-02-02 ENCOUNTER — Other Ambulatory Visit: Payer: Self-pay | Admitting: Medical

## 2016-02-02 NOTE — Telephone Encounter (Signed)
Is this ok to refill?  

## 2016-02-06 ENCOUNTER — Encounter (HOSPITAL_COMMUNITY): Payer: Medicare Other

## 2016-02-06 DIAGNOSIS — M2042 Other hammer toe(s) (acquired), left foot: Secondary | ICD-10-CM | POA: Diagnosis not present

## 2016-02-06 DIAGNOSIS — M24575 Contracture, left foot: Secondary | ICD-10-CM | POA: Diagnosis not present

## 2016-02-08 ENCOUNTER — Encounter (HOSPITAL_COMMUNITY): Payer: Medicare Other

## 2016-02-09 DIAGNOSIS — M25572 Pain in left ankle and joints of left foot: Secondary | ICD-10-CM | POA: Diagnosis not present

## 2016-02-12 ENCOUNTER — Ambulatory Visit (INDEPENDENT_AMBULATORY_CARE_PROVIDER_SITE_OTHER): Payer: Medicare Other | Admitting: *Deleted

## 2016-02-12 DIAGNOSIS — Z5181 Encounter for therapeutic drug level monitoring: Secondary | ICD-10-CM

## 2016-02-12 DIAGNOSIS — I82493 Acute embolism and thrombosis of other specified deep vein of lower extremity, bilateral: Secondary | ICD-10-CM | POA: Diagnosis not present

## 2016-02-12 DIAGNOSIS — I2782 Chronic pulmonary embolism: Secondary | ICD-10-CM | POA: Diagnosis not present

## 2016-02-12 LAB — POCT INR: INR: 2.3

## 2016-02-13 ENCOUNTER — Encounter (HOSPITAL_COMMUNITY)
Admission: RE | Admit: 2016-02-13 | Discharge: 2016-02-13 | Disposition: A | Discharge status: Home / Self Care | Payer: Medicare Other | Source: Ambulatory Visit | Attending: Cardiology | Admitting: Cardiology

## 2016-02-13 DIAGNOSIS — E7800 Pure hypercholesterolemia, unspecified: Secondary | ICD-10-CM | POA: Diagnosis not present

## 2016-02-13 DIAGNOSIS — I10 Essential (primary) hypertension: Secondary | ICD-10-CM | POA: Diagnosis not present

## 2016-02-13 DIAGNOSIS — Z7901 Long term (current) use of anticoagulants: Secondary | ICD-10-CM | POA: Diagnosis not present

## 2016-02-13 DIAGNOSIS — G8929 Other chronic pain: Secondary | ICD-10-CM | POA: Diagnosis not present

## 2016-02-13 DIAGNOSIS — G4733 Obstructive sleep apnea (adult) (pediatric): Secondary | ICD-10-CM | POA: Diagnosis not present

## 2016-02-13 DIAGNOSIS — Z86711 Personal history of pulmonary embolism: Secondary | ICD-10-CM | POA: Diagnosis not present

## 2016-02-14 ENCOUNTER — Other Ambulatory Visit: Payer: Self-pay | Admitting: Neurology

## 2016-02-15 ENCOUNTER — Encounter: Payer: Self-pay | Admitting: Medical

## 2016-02-15 ENCOUNTER — Encounter (HOSPITAL_COMMUNITY)
Admission: RE | Admit: 2016-02-15 | Discharge: 2016-02-15 | Disposition: A | Discharge status: Home / Self Care | Payer: Medicare Other | Source: Ambulatory Visit | Attending: Cardiology | Admitting: Cardiology

## 2016-02-15 DIAGNOSIS — E7800 Pure hypercholesterolemia, unspecified: Secondary | ICD-10-CM | POA: Diagnosis not present

## 2016-02-15 DIAGNOSIS — I10 Essential (primary) hypertension: Secondary | ICD-10-CM | POA: Diagnosis not present

## 2016-02-15 DIAGNOSIS — Z7901 Long term (current) use of anticoagulants: Secondary | ICD-10-CM | POA: Diagnosis not present

## 2016-02-15 DIAGNOSIS — G8929 Other chronic pain: Secondary | ICD-10-CM | POA: Diagnosis not present

## 2016-02-15 DIAGNOSIS — G4733 Obstructive sleep apnea (adult) (pediatric): Secondary | ICD-10-CM | POA: Diagnosis not present

## 2016-02-15 DIAGNOSIS — Z86711 Personal history of pulmonary embolism: Secondary | ICD-10-CM | POA: Diagnosis not present

## 2016-02-19 DIAGNOSIS — M25572 Pain in left ankle and joints of left foot: Secondary | ICD-10-CM | POA: Diagnosis not present

## 2016-02-20 ENCOUNTER — Encounter (HOSPITAL_COMMUNITY)
Admission: RE | Admit: 2016-02-20 | Discharge: 2016-02-20 | Disposition: A | Discharge status: Home / Self Care | Payer: Medicare Other | Source: Ambulatory Visit | Attending: Cardiology | Admitting: Cardiology

## 2016-02-20 DIAGNOSIS — Z86711 Personal history of pulmonary embolism: Secondary | ICD-10-CM | POA: Diagnosis not present

## 2016-02-20 DIAGNOSIS — I10 Essential (primary) hypertension: Secondary | ICD-10-CM | POA: Diagnosis not present

## 2016-02-20 DIAGNOSIS — E7800 Pure hypercholesterolemia, unspecified: Secondary | ICD-10-CM | POA: Diagnosis not present

## 2016-02-20 DIAGNOSIS — G8929 Other chronic pain: Secondary | ICD-10-CM | POA: Diagnosis not present

## 2016-02-20 DIAGNOSIS — G4733 Obstructive sleep apnea (adult) (pediatric): Secondary | ICD-10-CM | POA: Diagnosis not present

## 2016-02-20 DIAGNOSIS — Z7901 Long term (current) use of anticoagulants: Secondary | ICD-10-CM | POA: Diagnosis not present

## 2016-02-22 ENCOUNTER — Encounter: Payer: Self-pay | Admitting: Internal Medicine

## 2016-02-22 ENCOUNTER — Encounter (HOSPITAL_COMMUNITY)
Admission: RE | Admit: 2016-02-22 | Discharge: 2016-02-22 | Disposition: A | Discharge status: Home / Self Care | Payer: Medicare Other | Source: Ambulatory Visit | Attending: Cardiology | Admitting: Cardiology

## 2016-02-22 ENCOUNTER — Ambulatory Visit (INDEPENDENT_AMBULATORY_CARE_PROVIDER_SITE_OTHER): Payer: Medicare Other | Admitting: Internal Medicine

## 2016-02-22 VITALS — BP 112/60 | HR 78 | Ht 71.0 in | Wt 206.0 lb

## 2016-02-22 DIAGNOSIS — Z7901 Long term (current) use of anticoagulants: Secondary | ICD-10-CM | POA: Diagnosis not present

## 2016-02-22 DIAGNOSIS — I10 Essential (primary) hypertension: Secondary | ICD-10-CM | POA: Diagnosis not present

## 2016-02-22 DIAGNOSIS — Z86711 Personal history of pulmonary embolism: Secondary | ICD-10-CM | POA: Diagnosis not present

## 2016-02-22 DIAGNOSIS — G8929 Other chronic pain: Secondary | ICD-10-CM | POA: Diagnosis not present

## 2016-02-22 DIAGNOSIS — E7800 Pure hypercholesterolemia, unspecified: Secondary | ICD-10-CM | POA: Diagnosis not present

## 2016-02-22 DIAGNOSIS — R002 Palpitations: Secondary | ICD-10-CM

## 2016-02-22 DIAGNOSIS — G4733 Obstructive sleep apnea (adult) (pediatric): Secondary | ICD-10-CM | POA: Diagnosis not present

## 2016-02-22 MED ORDER — RIVAROXABAN 20 MG PO TABS
20.0000 mg | ORAL_TABLET | Freq: Every day | ORAL | Status: DC
Start: 2016-02-22 — End: 2016-11-28
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
## 2016-02-22 NOTE — Patient Instructions (Addendum)
Your physician wants you to follow-up in:  1 Year with Dr. Lovena Le. You will receive a reminder letter in the mail two months in advance. If you don't receive a letter, please call our office to schedule the follow-up appointment.  Your physician has recommended you make the following change in your medication:   STOP Taking Warfarin   Start Taking  Xarelto 20 mg Daily ( You may start taking Xarelto 3 days after stopping Warfarin)   If you need a refill on your cardiac medications before your next appointment, please call your pharmacy.  Thank you for choosing [facility]!

## 2016-02-27 ENCOUNTER — Encounter (HOSPITAL_COMMUNITY)
Admission: RE | Admit: 2016-02-27 | Discharge: 2016-02-27 | Disposition: A | Discharge status: Home / Self Care | Payer: Medicare Other | Source: Ambulatory Visit | Attending: Cardiology | Admitting: Cardiology

## 2016-02-27 DIAGNOSIS — Z79899 Other long term (current) drug therapy: Secondary | ICD-10-CM | POA: Insufficient documentation

## 2016-02-27 DIAGNOSIS — G8929 Other chronic pain: Secondary | ICD-10-CM | POA: Diagnosis not present

## 2016-02-27 DIAGNOSIS — E7800 Pure hypercholesterolemia, unspecified: Secondary | ICD-10-CM | POA: Diagnosis not present

## 2016-02-27 DIAGNOSIS — M549 Dorsalgia, unspecified: Secondary | ICD-10-CM | POA: Insufficient documentation

## 2016-02-27 DIAGNOSIS — I10 Essential (primary) hypertension: Secondary | ICD-10-CM | POA: Diagnosis not present

## 2016-02-27 DIAGNOSIS — Z7901 Long term (current) use of anticoagulants: Secondary | ICD-10-CM | POA: Insufficient documentation

## 2016-02-27 DIAGNOSIS — Z86711 Personal history of pulmonary embolism: Secondary | ICD-10-CM | POA: Insufficient documentation

## 2016-02-27 DIAGNOSIS — G4733 Obstructive sleep apnea (adult) (pediatric): Secondary | ICD-10-CM | POA: Insufficient documentation

## 2016-02-27 NOTE — Progress Notes (Signed)
Patient was given individual home exercise plan. Handout was reviewed and discussed with patient. Patient's long term goals were reviewed and reassessed. Patient signed home exercise plan and expressed understanding.   

## 2016-02-29 ENCOUNTER — Encounter (HOSPITAL_COMMUNITY)
Admission: RE | Admit: 2016-02-29 | Discharge: 2016-02-29 | Disposition: A | Discharge status: Home / Self Care | Payer: Medicare Other | Source: Ambulatory Visit | Attending: Cardiology | Admitting: Cardiology

## 2016-02-29 DIAGNOSIS — E7800 Pure hypercholesterolemia, unspecified: Secondary | ICD-10-CM | POA: Diagnosis not present

## 2016-02-29 DIAGNOSIS — G4733 Obstructive sleep apnea (adult) (pediatric): Secondary | ICD-10-CM | POA: Diagnosis not present

## 2016-02-29 DIAGNOSIS — Z7901 Long term (current) use of anticoagulants: Secondary | ICD-10-CM | POA: Diagnosis not present

## 2016-02-29 DIAGNOSIS — I10 Essential (primary) hypertension: Secondary | ICD-10-CM | POA: Diagnosis not present

## 2016-02-29 DIAGNOSIS — Z86711 Personal history of pulmonary embolism: Secondary | ICD-10-CM | POA: Diagnosis not present

## 2016-02-29 DIAGNOSIS — G8929 Other chronic pain: Secondary | ICD-10-CM | POA: Diagnosis not present

## 2016-03-03 ENCOUNTER — Other Ambulatory Visit: Payer: Self-pay | Admitting: Medical

## 2016-03-04 NOTE — Telephone Encounter (Signed)
Shane pt, ok to fill?

## 2016-03-05 ENCOUNTER — Encounter (HOSPITAL_COMMUNITY)
Admission: RE | Admit: 2016-03-05 | Discharge: 2016-03-05 | Disposition: A | Discharge status: Home / Self Care | Payer: Medicare Other | Source: Ambulatory Visit | Attending: Cardiology | Admitting: Cardiology

## 2016-03-05 DIAGNOSIS — G4733 Obstructive sleep apnea (adult) (pediatric): Secondary | ICD-10-CM | POA: Diagnosis not present

## 2016-03-05 DIAGNOSIS — Z86711 Personal history of pulmonary embolism: Secondary | ICD-10-CM | POA: Diagnosis not present

## 2016-03-05 DIAGNOSIS — E7800 Pure hypercholesterolemia, unspecified: Secondary | ICD-10-CM | POA: Diagnosis not present

## 2016-03-05 DIAGNOSIS — I10 Essential (primary) hypertension: Secondary | ICD-10-CM | POA: Diagnosis not present

## 2016-03-05 DIAGNOSIS — Z7901 Long term (current) use of anticoagulants: Secondary | ICD-10-CM | POA: Diagnosis not present

## 2016-03-05 DIAGNOSIS — G8929 Other chronic pain: Secondary | ICD-10-CM | POA: Diagnosis not present

## 2016-03-06 ENCOUNTER — Other Ambulatory Visit: Payer: Self-pay | Admitting: Medical

## 2016-03-07 ENCOUNTER — Encounter (HOSPITAL_COMMUNITY)
Admission: RE | Admit: 2016-03-07 | Discharge: 2016-03-07 | Disposition: A | Discharge status: Home / Self Care | Payer: Medicare Other | Source: Ambulatory Visit | Attending: Cardiology | Admitting: Cardiology

## 2016-03-07 DIAGNOSIS — G4733 Obstructive sleep apnea (adult) (pediatric): Secondary | ICD-10-CM | POA: Diagnosis not present

## 2016-03-07 DIAGNOSIS — I10 Essential (primary) hypertension: Secondary | ICD-10-CM | POA: Diagnosis not present

## 2016-03-07 DIAGNOSIS — E7800 Pure hypercholesterolemia, unspecified: Secondary | ICD-10-CM | POA: Diagnosis not present

## 2016-03-07 DIAGNOSIS — Z7901 Long term (current) use of anticoagulants: Secondary | ICD-10-CM | POA: Diagnosis not present

## 2016-03-07 DIAGNOSIS — G8929 Other chronic pain: Secondary | ICD-10-CM | POA: Diagnosis not present

## 2016-03-07 DIAGNOSIS — Z86711 Personal history of pulmonary embolism: Secondary | ICD-10-CM | POA: Diagnosis not present

## 2016-03-08 ENCOUNTER — Encounter: Payer: Self-pay | Admitting: *Deleted

## 2016-03-12 ENCOUNTER — Encounter (HOSPITAL_COMMUNITY)
Admission: RE | Admit: 2016-03-12 | Discharge: 2016-03-12 | Disposition: A | Discharge status: Home / Self Care | Payer: Medicare Other | Source: Ambulatory Visit | Attending: Cardiology | Admitting: Cardiology

## 2016-03-12 DIAGNOSIS — E7800 Pure hypercholesterolemia, unspecified: Secondary | ICD-10-CM | POA: Diagnosis not present

## 2016-03-12 DIAGNOSIS — G8929 Other chronic pain: Secondary | ICD-10-CM | POA: Diagnosis not present

## 2016-03-12 DIAGNOSIS — G4733 Obstructive sleep apnea (adult) (pediatric): Secondary | ICD-10-CM | POA: Diagnosis not present

## 2016-03-12 DIAGNOSIS — Z7901 Long term (current) use of anticoagulants: Secondary | ICD-10-CM | POA: Diagnosis not present

## 2016-03-12 DIAGNOSIS — I10 Essential (primary) hypertension: Secondary | ICD-10-CM | POA: Diagnosis not present

## 2016-03-12 DIAGNOSIS — Z86711 Personal history of pulmonary embolism: Secondary | ICD-10-CM | POA: Diagnosis not present

## 2016-03-14 ENCOUNTER — Encounter (HOSPITAL_COMMUNITY)
Admission: RE | Admit: 2016-03-14 | Discharge: 2016-03-14 | Disposition: A | Discharge status: Home / Self Care | Payer: Medicare Other | Source: Ambulatory Visit | Attending: Cardiology | Admitting: Cardiology

## 2016-03-14 DIAGNOSIS — G8929 Other chronic pain: Secondary | ICD-10-CM | POA: Diagnosis not present

## 2016-03-14 DIAGNOSIS — Z7901 Long term (current) use of anticoagulants: Secondary | ICD-10-CM | POA: Diagnosis not present

## 2016-03-14 DIAGNOSIS — G4733 Obstructive sleep apnea (adult) (pediatric): Secondary | ICD-10-CM | POA: Diagnosis not present

## 2016-03-14 DIAGNOSIS — Z86711 Personal history of pulmonary embolism: Secondary | ICD-10-CM | POA: Diagnosis not present

## 2016-03-14 DIAGNOSIS — E7800 Pure hypercholesterolemia, unspecified: Secondary | ICD-10-CM | POA: Diagnosis not present

## 2016-03-14 DIAGNOSIS — I10 Essential (primary) hypertension: Secondary | ICD-10-CM | POA: Diagnosis not present

## 2016-03-19 ENCOUNTER — Encounter (HOSPITAL_COMMUNITY): Payer: Medicare Other

## 2016-03-21 ENCOUNTER — Encounter (HOSPITAL_COMMUNITY)
Admission: RE | Admit: 2016-03-21 | Discharge: 2016-03-21 | Disposition: A | Discharge status: Home / Self Care | Payer: Medicare Other | Source: Ambulatory Visit | Attending: Cardiology | Admitting: Cardiology

## 2016-03-21 DIAGNOSIS — Z86711 Personal history of pulmonary embolism: Secondary | ICD-10-CM | POA: Diagnosis not present

## 2016-03-21 DIAGNOSIS — G4733 Obstructive sleep apnea (adult) (pediatric): Secondary | ICD-10-CM | POA: Diagnosis not present

## 2016-03-21 DIAGNOSIS — I10 Essential (primary) hypertension: Secondary | ICD-10-CM | POA: Diagnosis not present

## 2016-03-21 DIAGNOSIS — Z7901 Long term (current) use of anticoagulants: Secondary | ICD-10-CM | POA: Diagnosis not present

## 2016-03-21 DIAGNOSIS — E7800 Pure hypercholesterolemia, unspecified: Secondary | ICD-10-CM | POA: Diagnosis not present

## 2016-03-21 DIAGNOSIS — G8929 Other chronic pain: Secondary | ICD-10-CM | POA: Diagnosis not present

## 2016-03-23 ENCOUNTER — Other Ambulatory Visit: Payer: Self-pay | Admitting: Medical

## 2016-03-26 ENCOUNTER — Encounter (HOSPITAL_COMMUNITY)
Admission: RE | Admit: 2016-03-26 | Discharge: 2016-03-26 | Disposition: A | Discharge status: Home / Self Care | Payer: Medicare Other | Source: Ambulatory Visit | Attending: Cardiology | Admitting: Cardiology

## 2016-03-26 ENCOUNTER — Encounter (HOSPITAL_COMMUNITY): Payer: Medicare Other

## 2016-03-26 DIAGNOSIS — I10 Essential (primary) hypertension: Secondary | ICD-10-CM | POA: Diagnosis not present

## 2016-03-26 DIAGNOSIS — Z7901 Long term (current) use of anticoagulants: Secondary | ICD-10-CM | POA: Insufficient documentation

## 2016-03-26 DIAGNOSIS — E7800 Pure hypercholesterolemia, unspecified: Secondary | ICD-10-CM | POA: Insufficient documentation

## 2016-03-26 DIAGNOSIS — G8929 Other chronic pain: Secondary | ICD-10-CM | POA: Insufficient documentation

## 2016-03-26 DIAGNOSIS — M549 Dorsalgia, unspecified: Secondary | ICD-10-CM | POA: Insufficient documentation

## 2016-03-26 DIAGNOSIS — Z79899 Other long term (current) drug therapy: Secondary | ICD-10-CM | POA: Insufficient documentation

## 2016-03-26 DIAGNOSIS — G4733 Obstructive sleep apnea (adult) (pediatric): Secondary | ICD-10-CM | POA: Insufficient documentation

## 2016-03-26 DIAGNOSIS — Z86711 Personal history of pulmonary embolism: Secondary | ICD-10-CM | POA: Diagnosis not present

## 2016-03-28 ENCOUNTER — Encounter (HOSPITAL_COMMUNITY): Payer: Medicare Other

## 2016-04-01 ENCOUNTER — Encounter: Payer: Self-pay | Admitting: Medical

## 2016-04-01 ENCOUNTER — Ambulatory Visit (INDEPENDENT_AMBULATORY_CARE_PROVIDER_SITE_OTHER): Payer: Medicare Other | Admitting: Medical

## 2016-04-01 VITALS — BP 110/76 | HR 81 | Wt 198.0 lb

## 2016-04-01 DIAGNOSIS — M25511 Pain in right shoulder: Secondary | ICD-10-CM | POA: Diagnosis not present

## 2016-04-01 DIAGNOSIS — R29898 Other symptoms and signs involving the musculoskeletal system: Secondary | ICD-10-CM | POA: Diagnosis not present

## 2016-04-01 DIAGNOSIS — G4700 Insomnia, unspecified: Secondary | ICD-10-CM | POA: Diagnosis not present

## 2016-04-01 DIAGNOSIS — I10 Essential (primary) hypertension: Secondary | ICD-10-CM | POA: Diagnosis not present

## 2016-04-01 DIAGNOSIS — M25512 Pain in left shoulder: Secondary | ICD-10-CM

## 2016-04-01 DIAGNOSIS — G4733 Obstructive sleep apnea (adult) (pediatric): Secondary | ICD-10-CM | POA: Diagnosis not present

## 2016-04-01 DIAGNOSIS — E118 Type 2 diabetes mellitus with unspecified complications: Secondary | ICD-10-CM

## 2016-04-01 DIAGNOSIS — E785 Hyperlipidemia, unspecified: Secondary | ICD-10-CM | POA: Diagnosis not present

## 2016-04-01 DIAGNOSIS — R269 Unspecified abnormalities of gait and mobility: Secondary | ICD-10-CM | POA: Diagnosis not present

## 2016-04-01 DIAGNOSIS — R5381 Other malaise: Secondary | ICD-10-CM | POA: Diagnosis not present

## 2016-04-01 DIAGNOSIS — G589 Mononeuropathy, unspecified: Secondary | ICD-10-CM | POA: Diagnosis not present

## 2016-04-01 DIAGNOSIS — R531 Weakness: Secondary | ICD-10-CM

## 2016-04-01 DIAGNOSIS — Z9989 Dependence on other enabling machines and devices: Secondary | ICD-10-CM

## 2016-04-01 DIAGNOSIS — E0841 Diabetes mellitus due to underlying condition with diabetic mononeuropathy: Secondary | ICD-10-CM | POA: Diagnosis not present

## 2016-04-01 LAB — CBC
HCT: 46.1 % (ref 38.5–50.0)
HEMOGLOBIN: 15.6 g/dL (ref 13.2–17.1)
MCH: 29.8 pg (ref 27.0–33.0)
MCHC: 33.8 g/dL (ref 32.0–36.0)
MCV: 88 fL (ref 80.0–100.0)
MPV: 10.8 fL (ref 7.5–12.5)
PLATELETS: 323 10*3/uL (ref 140–400)
RBC: 5.24 MIL/uL (ref 4.20–5.80)
RDW: 12.7 % (ref 11.0–15.0)
WBC: 7.8 10*3/uL (ref 4.0–10.5)

## 2016-04-01 LAB — HEMOGLOBIN A1C
HEMOGLOBIN A1C: 6.7 % — AB (ref <5.7)
MEAN PLASMA GLUCOSE: 146 mg/dL

## 2016-04-01 MED ORDER — HYDROCODONE-ACETAMINOPHEN 5-325 MG PO TABS: ORAL_TABLET | ORAL | Status: DC

## 2016-04-01 NOTE — Progress Notes (Signed)
Subjective: Chief Complaint  Patient presents with  . Follow-up    lack of strenght, pain in upper body. problems sleeping because of pain. is starting to shake sometimes when he gets up in the morning.    Here for pains, weakness and f/u.   Continues to feels weakness in shoulders, legs, but weak in general.   Shoulder pain is limiting.  Can't hardly get up from seated or lying position due to weakness in legs in general.  Having pain in shoulders bilat.   Is awake in the early morning due to pain in shoulders.  Is coming up on the end of his pulmonary rehab in [place].  They are not impressed with the facility and attention from the nurses there.   They want him to be able to go PT in [place], [place] through [facility].  Has been doing recumbent row machine at the pulmonary rehab and some weights with 1 lb weights max.  .  Asking for pain medication for shoulders, says Tramadol keeps him awake.   hasn't seen ortho about his shoulders or back.  Using some zquil to help with sleep, but the main issue is shoulder pain.  He tends to lie on his side/ shoulders which aggravates the shoulder pain.  Has spells of back pain intermittent.    OSA - quit using the CPAP as he didn't feel it was beneficial.  It seemed to be keeping him up at night.  Saw Dr. Luan Pulling in [place] for CPAP and was told he didn't really need the CPAP.    Diabetes - the current regimen seems to be keeping his sugars in control.  Checking regularly, compliant with medications  Compliant with medication and says his pains and weakness started before he went back on statin, so doesn't attribute his weakness due to statin.  Compliant with his blood pressure and heart medications, sees cardiology.   Past Medical History  Diagnosis Date  . High cholesterol   . Hypertension   . OSA on CPAP   . Insomnia   . Pulmonary embolism ([place]) 01/2015  . DVT (deep venous thrombosis) ([place]) 01/2015  . Diabetes ([place])     type 2  . Chronic  back pain   . Spinal stenosis     lumbar  . Leg pain, diffuse   . Cataract   . Tremor     on propranolol  . Wears glasses   . Hearing impaired   . Mild cognitive impairment     sees [facility]  . Memory loss   . Insomnia   . Numbness     fingers, feet, toes  . Leg swelling   . Confusion   . Agitation   . 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 06/14/2015   Current Outpatient Prescriptions on File Prior to Visit  Medication Sig Dispense Refill  . diltiazem (CARDIZEM) 30 MG tablet Take 30 mg by mouth 2 (two) times daily.    [name] donepezil (ARICEPT) 10 MG tablet TAKE 1 TABLET BY MOUTH EVERY NIGHT AT BEDTIME 30 tablet 6  . Dulaglutide (TRULICITY) 1.5 0000000 SOPN Inject 1.5 mg into the skin once a week. 2 mL 5  . flecainide (TAMBOCOR) 150 MG tablet Take 0.5 tablets (75 mg total) by mouth 2 (two) times daily. 45 tablet 3  . glucose blood (TRUE METRIX BLOOD GLUCOSE TEST) test strip Use as instructed 100 each 12  . INVOKAMET [phone] MG TABS TAKE 1 TABLET BY  MOUTH TWICE DAILY 60 tablet 0  . iron polysaccharides (NIFEREX) 150 MG capsule Take 1 capsule (150 mg total) by mouth daily. 30 capsule 6  . Misc Natural Products (OSTEO BI-FLEX ADV JOINT SHIELD PO) Take 1 tablet by mouth daily.     . pravastatin (PRAVACHOL) 20 MG tablet Take 1 tablet (20 mg total) by mouth daily. 90 tablet 3  . rivaroxaban (XARELTO) 20 MG TABS tablet Take 1 tablet (20 mg total) by mouth daily with supper. 30 tablet 11  . traMADol (ULTRAM) 50 MG tablet Take 1 tablet (50 mg total) by mouth 2 (two) times daily. 180 tablet 0  . celecoxib (CELEBREX) 100 MG capsule Take 100 mg by mouth 2 (two) times daily. Reported on 04/01/2016    . cyclobenzaprine (FLEXERIL) 10 MG tablet TAKE 1 TABLET BY MOUTH AT BEDTIME (Patient not taking: Reported on 04/01/2016) 30 tablet 0  . enoxaparin (LOVENOX) 150 MG/ML injection Inject 1 mL (150 mg total) into the skin daily. (Patient not taking:  Reported on 04/01/2016) 10 Syringe 1  . EPINEPHrine (EPIPEN 2-PAK) 0.3 mg/0.3 mL IJ SOAJ injection Inject 0.3 mLs (0.3 mg total) into the muscle once. (Patient not taking: Reported on 04/01/2016) 1 Device 0   No current facility-administered medications on file prior to visit.     ROS as in subjective   Objective: BP 110/76 mmHg  Pulse 81  Wt 198 lb (89.812 kg)  Wt Readings from Last 3 Encounters:  04/01/16 198 lb (89.812 kg)  02/22/16 206 lb (93.441 kg)  01/29/16 204 lb (92.534 kg)   Gen: wd, wn, nad No worrisome skin findings  His gait is somewhat shuffled and cautious He has a lot of difficulty getting up from seated position.  I had to help him off the exam table He has visible loss of muscle mass in the arms bilat Tender over upper arms and deltoids bilat, tender over right shoulder generalized, tender over right trapezius and supraspinatus, pain with apprehension test bilat, and he is tense and cautious with shoulder exam, is somewhat rigid, somewhat cog wheeling of arm motions.  bilat shoulder ROM is limited to about 100 degrees of flexion due to pain.  No specific tenders over [facility] joint or SCM joint Back: tender lumbar paraspinal region Legs nontender, relatively normal hip ROM, no swelling Neuro: barely 4/5 strength throughout of UE and LE, no obvious tremor at rest, but there is some slight tremor with goal oriented motion of arms Pulses 1+ throughout Neck: supple, no lymphadenopathy, no thyromegaly, no masses Heart: RRR, normal S1, S2, no murmurs Lungs: CTA bilaterally, no wheezes, rhonchi, or rales Psych: answered questions appropriately, A&Ox3     Assessment: Encounter Diagnoses  Name Primary?  . Type 2 diabetes mellitus with complication, without long-term current use of insulin ([place]) Yes  . Diabetic mononeuropathy associated with diabetes mellitus due to underlying condition ([place])   . Essential hypertension   . Generalized weakness   . Hyperlipidemia   .  Insomnia   . OSA on CPAP   . Leg weakness, bilateral   . Pain of both shoulder joints   . Physical deconditioning   . Gait disturbance        Plan: Reviewed neurology notes from  01/23/16.  C/t Aricept.     Reviewed oncology/hematology notes from 01/15/16.  reviewed cardiology notes from 02/22/16.  He will c/t Cardizem, Xarelto, Flecainide.  Diabetes type 2 - labs today.   C/t Invokamet [id number] mg BID, c/t Trulicity weekly.  hyperlipidemia - we will take a drug holiday from Pravachol to see if this is contributing to his leg weakness.   Advised he stop this for the next 2-3 weeks.  Shoulder pain, back pain, gait disturbance, deconditioning - reviewed xrays in chart from 10/2015 for hips and shoulder.   Refer to PT, can use Hydrocodone BID for now and consider ortho referral pending his progress with PT.  He is deconditioned with muscle loss in arms, so hopefully PT can help get some improvement there.  Stop Ultram.     OSA - he declines to use CPAP  They are concerned about parkinson's, and have discussed with neurology prior.  They may want to revisit neurology consult but with different neurologist.   They felt disrespected with the most recent visit at neurology and don't plan to go back.   F/u pending labs

## 2016-04-02 ENCOUNTER — Encounter (HOSPITAL_COMMUNITY)
Admission: RE | Admit: 2016-04-02 | Discharge: 2016-04-02 | Disposition: A | Discharge status: Home / Self Care | Payer: Medicare Other | Source: Ambulatory Visit | Attending: Cardiology | Admitting: Cardiology

## 2016-04-02 ENCOUNTER — Ambulatory Visit: Payer: Self-pay | Admitting: *Deleted

## 2016-04-02 ENCOUNTER — Other Ambulatory Visit: Payer: Self-pay | Admitting: *Deleted

## 2016-04-02 ENCOUNTER — Encounter (HOSPITAL_COMMUNITY): Payer: Medicare Other

## 2016-04-02 DIAGNOSIS — M25512 Pain in left shoulder: Secondary | ICD-10-CM

## 2016-04-02 DIAGNOSIS — Z86711 Personal history of pulmonary embolism: Secondary | ICD-10-CM | POA: Diagnosis not present

## 2016-04-02 DIAGNOSIS — IMO0002 Reserved for concepts with insufficient information to code with codable children: Secondary | ICD-10-CM | POA: Insufficient documentation

## 2016-04-02 DIAGNOSIS — E1165 Type 2 diabetes mellitus with hyperglycemia: Secondary | ICD-10-CM | POA: Insufficient documentation

## 2016-04-02 DIAGNOSIS — R29898 Other symptoms and signs involving the musculoskeletal system: Secondary | ICD-10-CM | POA: Insufficient documentation

## 2016-04-02 DIAGNOSIS — R269 Unspecified abnormalities of gait and mobility: Secondary | ICD-10-CM | POA: Insufficient documentation

## 2016-04-02 DIAGNOSIS — M25511 Pain in right shoulder: Secondary | ICD-10-CM | POA: Insufficient documentation

## 2016-04-02 DIAGNOSIS — Z5181 Encounter for therapeutic drug level monitoring: Secondary | ICD-10-CM

## 2016-04-02 DIAGNOSIS — Z7901 Long term (current) use of anticoagulants: Secondary | ICD-10-CM | POA: Diagnosis not present

## 2016-04-02 DIAGNOSIS — G8929 Other chronic pain: Secondary | ICD-10-CM | POA: Diagnosis not present

## 2016-04-02 DIAGNOSIS — E118 Type 2 diabetes mellitus with unspecified complications: Secondary | ICD-10-CM | POA: Insufficient documentation

## 2016-04-02 DIAGNOSIS — G4733 Obstructive sleep apnea (adult) (pediatric): Secondary | ICD-10-CM | POA: Diagnosis not present

## 2016-04-02 DIAGNOSIS — R5381 Other malaise: Secondary | ICD-10-CM | POA: Insufficient documentation

## 2016-04-02 DIAGNOSIS — I10 Essential (primary) hypertension: Secondary | ICD-10-CM | POA: Diagnosis not present

## 2016-04-02 DIAGNOSIS — E7800 Pure hypercholesterolemia, unspecified: Secondary | ICD-10-CM | POA: Diagnosis not present

## 2016-04-02 LAB — CK: Total CK: 21 U/L (ref 7–232)

## 2016-04-02 LAB — COMPREHENSIVE METABOLIC PANEL
ALBUMIN: 3.8 g/dL (ref 3.6–5.1)
ALK PHOS: 80 U/L (ref 40–115)
ALT: 14 U/L (ref 9–46)
AST: 19 U/L (ref 10–35)
BUN: 17 mg/dL (ref 7–25)
CALCIUM: 9.6 mg/dL (ref 8.6–10.3)
CO2: 25 mmol/L (ref 20–31)
Chloride: 101 mmol/L (ref 98–110)
Creat: 0.83 mg/dL (ref 0.70–1.18)
GLUCOSE: 192 mg/dL — AB (ref 65–99)
POTASSIUM: 4.4 mmol/L (ref 3.5–5.3)
Sodium: 136 mmol/L (ref 135–146)
TOTAL PROTEIN: 6.6 g/dL (ref 6.1–8.1)
Total Bilirubin: 0.4 mg/dL (ref 0.2–1.2)

## 2016-04-02 MED ORDER — DILTIAZEM HCL 30 MG PO TABS: 30.0000 mg | ORAL_TABLET | Freq: Two times a day (BID) | ORAL | Status: DC

## 2016-04-03 ENCOUNTER — Other Ambulatory Visit: Payer: Self-pay | Admitting: Medical

## 2016-04-04 ENCOUNTER — Encounter (HOSPITAL_COMMUNITY)
Admission: RE | Admit: 2016-04-04 | Discharge: 2016-04-04 | Disposition: A | Discharge status: Home / Self Care | Payer: Medicare Other | Source: Ambulatory Visit | Attending: Cardiology | Admitting: Cardiology

## 2016-04-04 ENCOUNTER — Encounter (HOSPITAL_COMMUNITY): Payer: Medicare Other

## 2016-04-04 DIAGNOSIS — I10 Essential (primary) hypertension: Secondary | ICD-10-CM | POA: Diagnosis not present

## 2016-04-04 DIAGNOSIS — G8929 Other chronic pain: Secondary | ICD-10-CM | POA: Diagnosis not present

## 2016-04-04 DIAGNOSIS — E7800 Pure hypercholesterolemia, unspecified: Secondary | ICD-10-CM | POA: Diagnosis not present

## 2016-04-04 DIAGNOSIS — G4733 Obstructive sleep apnea (adult) (pediatric): Secondary | ICD-10-CM | POA: Diagnosis not present

## 2016-04-04 DIAGNOSIS — Z86711 Personal history of pulmonary embolism: Secondary | ICD-10-CM | POA: Diagnosis not present

## 2016-04-04 DIAGNOSIS — Z7901 Long term (current) use of anticoagulants: Secondary | ICD-10-CM | POA: Diagnosis not present

## 2016-04-09 ENCOUNTER — Encounter (HOSPITAL_COMMUNITY): Payer: Medicare Other

## 2016-04-09 ENCOUNTER — Encounter (HOSPITAL_COMMUNITY)
Admission: RE | Admit: 2016-04-09 | Discharge: 2016-04-09 | Disposition: A | Discharge status: Home / Self Care | Payer: Medicare Other | Source: Ambulatory Visit | Attending: Cardiology | Admitting: Cardiology

## 2016-04-09 DIAGNOSIS — Z7901 Long term (current) use of anticoagulants: Secondary | ICD-10-CM | POA: Diagnosis not present

## 2016-04-09 DIAGNOSIS — E7800 Pure hypercholesterolemia, unspecified: Secondary | ICD-10-CM | POA: Diagnosis not present

## 2016-04-09 DIAGNOSIS — Z86711 Personal history of pulmonary embolism: Secondary | ICD-10-CM | POA: Diagnosis not present

## 2016-04-09 DIAGNOSIS — G4733 Obstructive sleep apnea (adult) (pediatric): Secondary | ICD-10-CM | POA: Diagnosis not present

## 2016-04-09 DIAGNOSIS — I10 Essential (primary) hypertension: Secondary | ICD-10-CM | POA: Diagnosis not present

## 2016-04-09 DIAGNOSIS — G8929 Other chronic pain: Secondary | ICD-10-CM | POA: Diagnosis not present

## 2016-04-10 ENCOUNTER — Ambulatory Visit: Payer: Medicare Other | Attending: Medical | Admitting: Physical Therapy

## 2016-04-10 ENCOUNTER — Encounter: Payer: Self-pay | Admitting: Physical Therapy

## 2016-04-10 VITALS — HR 82

## 2016-04-10 DIAGNOSIS — R2681 Unsteadiness on feet: Secondary | ICD-10-CM | POA: Diagnosis not present

## 2016-04-10 NOTE — Therapy (Signed)
[facility] [place], [place], [address] Phone: [phone]   Fax:  [phone]  Physical Therapy Evaluation  Patient Details  Name: Christian [name] MRN: [id number] Date of Birth: 1941-10-14 Referring Provider: Glade Lloyd MD  Encounter Date: 04/10/2016      PT End of Session - 04/10/16 1201    Visit Number 1   Number of Visits 16   Date for PT Re-Evaluation 06/02/16   PT Start Time 1115   PT Stop Time 1159   PT Time Calculation (min) 44 min      Past Medical History  Diagnosis Date  . High cholesterol   . Hypertension   . OSA on CPAP   . Insomnia   . Pulmonary embolism ([place]) 01/2015  . DVT (deep venous thrombosis) ([place]) 01/2015  . Diabetes ([place])     type 2  . Chronic back pain   . Spinal stenosis     lumbar  . Leg pain, diffuse   . Cataract   . Tremor     on propranolol  . Wears glasses   . Hearing impaired   . Mild cognitive impairment     sees [facility] Neurology  . Memory loss   . Insomnia   . Numbness     fingers, feet, toes  . Leg swelling   . Confusion   . Agitation   . 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 06/14/2015    Past Surgical History  Procedure Laterality Date  . Tonsillectomy    . Cataract extraction    . Colonoscopy  2014    Filed Vitals:   04/10/16 1129  Pulse: 82  SpO2: 97%         Subjective Assessment - 04/10/16 1122    Subjective I feel a bit woobly but I am better than I was a year and a half ago.   Pertinent History Patient was diagnosed with blood clots in bialteral LEs and in lungs (Saddle clot) in March; was in the hospital for 3-4 days. Patient did not receive rehab immediately after getting out of the hospital. Patient states things have been very up and down recently.    How long can you walk comfortably? 10 minutes.   Patient Stated Goals increase energy and quality of life.             [facility] PT  Assessment - 04/10/16 0001    Assessment   Medical Diagnosis Generalized weakness.   Referring Provider Tysinger MD   Onset Date/Surgical Date --  01/31/15   Precautions   Precaution Comments Blood thinner.   Restrictions   Weight Bearing Restrictions No   Balance Screen   Has the patient fallen in the past 6 months Yes   How many times? --  "3 slides off bed".   Has the patient had a decrease in activity level because of a fear of falling?  Yes   Is the patient reluctant to leave their home because of a fear of falling?  No   Home Ecologist residence   Prior Function   Level of Independence Independent   Vocation Retired   Mining engineer Comments Bilateral LE ER.   ROM / Strength   AROM / PROM / Strength --  Bil active LE ROM WFL.   Strength   Right Hip Flexion --  bil hip flex/abd=4/5.   Right Ankle Dorsiflexion --  Bilateral  dorsiflexion= 4/5.   Ambulation/Gait   Gait Comments Slow and purposeful gait pattern with bilateral LE ER.   Standardized Balance Assessment   Standardized Balance Assessment Berg Balance Test   Berg Balance Test   Sit to Stand Able to stand without using hands and stabilize independently   Standing Unsupported Able to stand safely 2 minutes   Sitting with Back Unsupported but Feet Supported on Floor or Stool Able to sit safely and securely 2 minutes   Stand to Sit Sits safely with minimal use of hands   Transfers Able to transfer safely, definite need of hands   Standing Unsupported with Eyes Closed Able to stand 10 seconds with supervision   Standing Ubsupported with Feet Together Able to place feet together independently and stand 1 minute safely   From Standing, Reach Forward with Outstretched Arm Can reach confidently >25 cm (10")   From Standing Position, Pick up Object from Floor Able to pick up shoe, needs supervision   From Standing Position, Turn to Look Behind Over each Shoulder Looks  behind from both sides and weight shifts well   Turn 360 Degrees Able to turn 360 degrees safely in 4 seconds or less   Standing Unsupported, Alternately Place Feet on Step/Stool Able to complete 4 steps without aid or supervision   Standing Unsupported, One Foot in Front Needs help to step but can hold 15 seconds   Standing on One Leg Tries to lift leg/unable to hold 3 seconds but remains standing independently   Total Score 45                   [facility] Adult PT Treatment/Exercise - 04/10/16 0001    Exercises   Exercises Knee/Hip   Knee/Hip Exercises: Aerobic   Nustep Level 3 x 10 minutes.                  PT Short Term Goals - 04/10/16 1157    PT SHORT TERM GOAL #1   Title Ind with an initial HEP.   Time 4   Period Weeks   Status New           PT Long Term Goals - 04/10/16 1157    PT LONG TERM GOAL #1   Title Ind with an advanced HEP.   Time 8   Period Weeks   Status New   PT LONG TERM GOAL #2   Title Patient will be able to complete Berg balance test with a score of at least 50 to demonstrate reduced overall fall risk    Time 8   Period Weeks   Status New   PT LONG TERM GOAL #3   Title 5/5 bilateral LE strength grade to improve ease of perfoming functional movements and activites.   Time 8   Period Weeks   Status New   PT LONG TERM GOAL #4   Title Patient to be able to tolerate full 45 minute treatment session of various activities with no rest breaks and minimal fatigue    Time 8   Period Weeks   Status New   PT LONG TERM GOAL #5   Title Patient walk a community distance without rest break.   Time 8   Period Weeks   Status New               Plan - 04/10/16 1149    Clinical Impression Statement The patient reports bilateral PE's with an onset of 18 months ago.  The  patient states he has one visit remaining for pulomary/cardiac rehab and now is transitioning to outpatient physical therapy.  He is motivated to improve to regain a  better quality of life.  Following Nstep after 10 minutes today his 02 sat= 98% and he felt good.  It was recommended that the patient use his cane at all times.  He stated that he doesn't bring it with him as he is afraid he will leave it.   PT Frequency 2x / week   PT Duration 8 weeks   PT Treatment/Interventions ADLs/Self Care Home Management;Therapeutic exercise;Therapeutic activities;Gait training;Balance training;Neuromuscular re-education;Patient/family education   PT Next Visit Plan Bilateral LE strengthening; gait and balance activites.  Please monitor 02 sat level.   Consulted and Agree with Plan of Care Patient      Patient will benefit from skilled therapeutic intervention in order to improve the following deficits and impairments:  Abnormal gait, Decreased balance, Decreased endurance, Decreased activity tolerance, Decreased strength  Visit Diagnosis: Unsteadiness - Plan: PT plan of care cert/re-cert      G-Codes - [id number]    Functional Assessment Tool Used Clinical judgement.   Functional Limitation Mobility: Walking and moving around   Mobility: Walking and Moving Around Current Status [phone]) At least 40 percent but less than 60 percent impaired, limited or restricted   Mobility: Walking and Moving Around Goal Status [phone]) At least 1 percent but less than 20 percent impaired, limited or restricted       Problem List Patient Active Problem List   Diagnosis Date Noted  . Type 2 diabetes mellitus with complication, without long-term current use of insulin ([place]) 04/02/2016  . Gait disturbance 04/02/2016  . Physical deconditioning 04/02/2016  . Pain of both shoulder joints 04/02/2016  . Leg weakness, bilateral 04/02/2016  . Encounter for therapeutic drug monitoring 11/29/2015  . Encounter for health maintenance examination in adult 10/30/2015  . Shoulder pain, bilateral 10/30/2015  . Muscle weakness 10/30/2015  . Generalized weakness 10/30/2015  . Skin lesion  10/30/2015  . PVC (premature ventricular contraction) 10/10/2015  . Diabetes type 2, controlled ([place]) 06/20/2015  . Decreased energy 06/20/2015  . Pulmonary embolism ([place]) 06/20/2015  . DVT (deep venous thrombosis) ([place]) 06/20/2015  . Hammer toe of left foot 06/20/2015  . Pre-ulcerative calluses 06/20/2015  . Hypertrophic toenail 06/20/2015  . Diabetic mononeuropathy associated with diabetes mellitus due to underlying condition ([place]) 06/20/2015  . Leg pain, bilateral 06/20/2015  . Chronic low back pain 06/20/2015  . 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 06/14/2015  . Diabetes type 2, uncontrolled ([place]) 03/06/2015  . OSA on CPAP 03/06/2015  . Edema 03/06/2015  . Leg pain, diffuse 03/06/2015  . Bilateral low back pain without sciatica 03/06/2015  . Tremor 03/06/2015  . Insomnia 03/06/2015  . Vaccine counseling 03/06/2015  . Chronic back pain 03/06/2015  . Wears glasses 03/06/2015  . Hearing loss 03/06/2015  . Spinal stenosis of lumbar region 03/06/2015  . Ptosis 03/06/2015  . Pulmonary emboli ([place]) 03/06/2015  . Positive depression screening 03/06/2015  . Advance directive discussed with patient 03/06/2015  . Bradycardia 02/27/2015  . Long-term (current) use of anticoagulants   . Dementia   . Essential hypertension 02/08/2015  . Hyperlipidemia 02/08/2015  . SOB (shortness of breath) 02/08/2015  . OSA (obstructive sleep apnea) 02/08/2015  . Mild cognitive impairment 02/08/2015  . PE (pulmonary embolism) 01/31/2015    Rielynn Trulson, [place] MPT 04/10/2016, 12:04 PM  [facility] [place], [place], [address] Phone: [phone]   Fax:  [phone]  Name: Christian [name] MRN: [id number] Date of Birth: [birth year]

## 2016-04-11 ENCOUNTER — Encounter (HOSPITAL_COMMUNITY)
Admission: RE | Admit: 2016-04-11 | Discharge: 2016-04-11 | Disposition: A | Discharge status: Home / Self Care | Payer: Medicare Other | Source: Ambulatory Visit | Attending: Cardiology | Admitting: Cardiology

## 2016-04-11 ENCOUNTER — Encounter (HOSPITAL_COMMUNITY): Payer: Medicare Other

## 2016-04-11 DIAGNOSIS — Z7901 Long term (current) use of anticoagulants: Secondary | ICD-10-CM | POA: Diagnosis not present

## 2016-04-11 DIAGNOSIS — Z86711 Personal history of pulmonary embolism: Secondary | ICD-10-CM | POA: Diagnosis not present

## 2016-04-11 DIAGNOSIS — G4733 Obstructive sleep apnea (adult) (pediatric): Secondary | ICD-10-CM | POA: Diagnosis not present

## 2016-04-11 DIAGNOSIS — I10 Essential (primary) hypertension: Secondary | ICD-10-CM | POA: Diagnosis not present

## 2016-04-11 DIAGNOSIS — G8929 Other chronic pain: Secondary | ICD-10-CM | POA: Diagnosis not present

## 2016-04-11 DIAGNOSIS — E7800 Pure hypercholesterolemia, unspecified: Secondary | ICD-10-CM | POA: Diagnosis not present

## 2016-04-12 ENCOUNTER — Ambulatory Visit: Payer: Medicare Other | Admitting: *Deleted

## 2016-04-12 VITALS — HR 90

## 2016-04-12 DIAGNOSIS — R2681 Unsteadiness on feet: Secondary | ICD-10-CM

## 2016-04-12 NOTE — Therapy (Signed)
[facility] [place], [place], [address] Phone: [phone]   Fax:  [phone]  Physical Therapy Treatment  Patient Details  Name: Christian [name] MRN: [id number] Date of Birth: [birth year] Referring Provider: Glade Lloyd MD  Encounter Date: 04/12/2016      PT End of Session - 04/12/16 1142    Visit Number 2   Number of Visits 16   Date for PT Re-Evaluation 06/02/16   PT Start Time 1030   PT Stop Time 1121   PT Time Calculation (min) 51 min      Past Medical History  Diagnosis Date  . High cholesterol   . Hypertension   . OSA on CPAP   . Insomnia   . Pulmonary embolism ([place]) 01/2015  . DVT (deep venous thrombosis) ([place]) 01/2015  . Diabetes ([place])     type 2  . Chronic back pain   . Spinal stenosis     lumbar  . Leg pain, diffuse   . Cataract   . Tremor     on propranolol  . Wears glasses   . Hearing impaired   . Mild cognitive impairment     sees [facility]  . Memory loss   . Insomnia   . Numbness     fingers, feet, toes  . Leg swelling   . Confusion   . Agitation   . 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 06/14/2015    Past Surgical History  Procedure Laterality Date  . Tonsillectomy    . Cataract extraction    . Colonoscopy  2014    Filed Vitals:   04/12/16 1049  Pulse: 90  SpO2: 96%        Subjective Assessment - 04/12/16 1032    Subjective I feel a bit woobly but I am better than I was a year and a half ago.   Pertinent History Patient was diagnosed with blood clots in bialteral LEs and in lungs (Saddle clot) in March; was in the hospital for 3-4 days. Patient did not receive rehab immediately after getting out of the hospital. Patient states things have been very up and down recently.    How long can you walk comfortably? 10 minutes.   Patient Stated Goals increase energy and quality of life.    Currently in Pain? No/denies                          [facility] Adult PT Treatment/Exercise - 04/12/16 0001    Exercises   Exercises Knee/Hip   Knee/Hip Exercises: Aerobic   Nustep Level 3 x 16 minutes.seat 12. UE and LE activity    O2  96%   Knee/Hip Exercises: Standing   Heel Raises Both;2 sets;10 reps   Hip Flexion AROM;Both;2 sets;20 reps  marching    Hip Abduction AROM;Both;2 sets;10 reps   Forward Step Up Both;2 sets;10 reps;Step Height: 6"   Rocker Board 5 minutes  balance and calf stretching   Knee/Hip Exercises: Seated   Sit to Sand 3 sets;10 reps;with UE support                  PT Short Term Goals - 04/10/16 1157    PT SHORT TERM GOAL #1   Title Ind with an initial HEP.   Time 4   Period Weeks   Status New           PT Long Term  Goals - 04/10/16 1157    PT LONG TERM GOAL #1   Title Ind with an advanced HEP.   Time 8   Period Weeks   Status New   PT LONG TERM GOAL #2   Title Patient will be able to complete Berg balance test with a score of at least 50 to demonstrate reduced overall fall risk    Time 8   Period Weeks   Status New   PT LONG TERM GOAL #3   Title 5/5 bilateral LE strength grade to improve ease of perfoming functional movements and activites.   Time 8   Period Weeks   Status New   PT LONG TERM GOAL #4   Title Patient to be able to tolerate full 45 minute treatment session of various activities with no rest breaks and minimal fatigue    Time 8   Period Weeks   Status New   PT LONG TERM GOAL #5   Title Patient walk a community distance without rest break.   Time 8   Period Weeks   Status New               Plan - 04/12/16 1033    Clinical Impression Statement Pt did fairly well today and was able to complete all exs today with O2 levels staying in the high 90's (96%). Pt ambulated independently in clinic today and did well   PT Duration 8 weeks   PT Treatment/Interventions ADLs/Self Care Home Management;Therapeutic exercise;Therapeutic  activities;Gait training;Balance training;Neuromuscular re-education;Patient/family education   PT Next Visit Plan Bilateral LE strengthening; gait and balance activites.  Please monitor 02 sat level.   PT Home Exercise Plan Marching in place, sit to stand, calf raises   Consulted and Agree with Plan of Care Patient      Patient will benefit from skilled therapeutic intervention in order to improve the following deficits and impairments:  Abnormal gait, Decreased balance, Decreased endurance, Decreased activity tolerance, Decreased strength  Visit Diagnosis: Unsteadiness     Problem List Patient Active Problem List   Diagnosis Date Noted  . Type 2 diabetes mellitus with complication, without long-term current use of insulin ([place]) 04/02/2016  . Gait disturbance 04/02/2016  . Physical deconditioning 04/02/2016  . Pain of both shoulder joints 04/02/2016  . Leg weakness, bilateral 04/02/2016  . Encounter for therapeutic drug monitoring 11/29/2015  . Encounter for health maintenance examination in adult 10/30/2015  . Shoulder pain, bilateral 10/30/2015  . Muscle weakness 10/30/2015  . Generalized weakness 10/30/2015  . Skin lesion 10/30/2015  . PVC (premature ventricular contraction) 10/10/2015  . Diabetes type 2, controlled ([place]) 06/20/2015  . Decreased energy 06/20/2015  . Pulmonary embolism ([place]) 06/20/2015  . DVT (deep venous thrombosis) ([place]) 06/20/2015  . Hammer toe of left foot 06/20/2015  . Pre-ulcerative calluses 06/20/2015  . Hypertrophic toenail 06/20/2015  . Diabetic mononeuropathy associated with diabetes mellitus due to underlying condition ([place]) 06/20/2015  . Leg pain, bilateral 06/20/2015  . Chronic low back pain 06/20/2015  . 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 06/14/2015  . Diabetes type 2, uncontrolled ([place]) 03/06/2015  . OSA on CPAP 03/06/2015  . Edema 03/06/2015  . Leg pain, diffuse 03/06/2015  .  Bilateral low back pain without sciatica 03/06/2015  . Tremor 03/06/2015  . Insomnia 03/06/2015  . Vaccine counseling 03/06/2015  . Chronic back pain 03/06/2015  . Wears glasses 03/06/2015  . Hearing loss 03/06/2015  . Spinal stenosis of lumbar  region 03/06/2015  . Ptosis 03/06/2015  . Pulmonary emboli ([place]) 03/06/2015  . Positive depression screening 03/06/2015  . Advance directive discussed with patient 03/06/2015  . Bradycardia 02/27/2015  . Long-term (current) use of anticoagulants   . Dementia   . Essential hypertension 02/08/2015  . Hyperlipidemia 02/08/2015  . SOB (shortness of breath) 02/08/2015  . OSA (obstructive sleep apnea) 02/08/2015  . Mild cognitive impairment 02/08/2015  . PE (pulmonary embolism) 01/31/2015    RAMSEUR,CHRIS, PTA 04/12/2016, 11:42 AM  [facility] [place], [place], [address] Phone: [phone]   Fax:  [phone]  Name: ABAS SGRO MRN: [id number] Date of Birth: October 29, 1941

## 2016-04-16 ENCOUNTER — Ambulatory Visit: Payer: Medicare Other | Admitting: Physical Therapy

## 2016-04-16 DIAGNOSIS — R2681 Unsteadiness on feet: Secondary | ICD-10-CM

## 2016-04-16 NOTE — Therapy (Signed)
[facility] [place], [place], [address] Phone: [phone]   Fax:  [phone]  Physical Therapy Treatment  Patient Details  Name: Christian [name] MRN: [id number] Date of Birth: Apr 28, 1941 Referring Provider: Glade Lloyd MD  Encounter Date: 04/16/2016      PT End of Session - 04/16/16 1520    Visit Number 3   Number of Visits 16   Date for PT Re-Evaluation 06/02/16   PT Start Time 1519   PT Stop Time 1600   PT Time Calculation (min) 41 min   Activity Tolerance Patient tolerated treatment well   Behavior During Therapy [facility] for tasks assessed/performed      Past Medical History  Diagnosis Date  . High cholesterol   . Hypertension   . OSA on CPAP   . Insomnia   . Pulmonary embolism ([place]) 01/2015  . DVT (deep venous thrombosis) ([place]) 01/2015  . Diabetes ([place])     type 2  . Chronic back pain   . Spinal stenosis     lumbar  . Leg pain, diffuse   . Cataract   . Tremor     on propranolol  . Wears glasses   . Hearing impaired   . Mild cognitive impairment     sees [facility]rology  . Memory loss   . Insomnia   . Numbness     fingers, feet, toes  . Leg swelling   . Confusion   . Agitation   . 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 06/14/2015    Past Surgical History  Procedure Laterality Date  . Tonsillectomy    . Cataract extraction    . Colonoscopy  2014    Filed Vitals:   04/16/16 1530  SpO2: 95%        Subjective Assessment - 04/16/16 1520    Subjective No new c/o since last visit.    Pertinent History Patient was diagnosed with blood clots in bialteral LEs and in lungs (Saddle clot) in March; was in the hospital for 3-4 days. Patient did not receive rehab immediately after getting out of the hospital. Patient states things have been very up and down recently.    How long can you walk comfortably? 10 minutes.   Patient Stated Goals increase  energy and quality of life.    Currently in Pain? No/denies                         [facility] Adult PT Treatment/Exercise - 04/16/16 0001    Knee/Hip Exercises: Aerobic   Nustep Level 3 x 18 minutes.seat 12. UE and LE activity    O2  96%   Knee/Hip Exercises: Standing   Heel Raises Both;2 sets;10 reps   Hip Flexion Stengthening;2 sets;10 reps   Hip Abduction Stengthening;4 sets;5 reps   Abduction Limitations Externally rotates hips   Rocker Board 2 minutes   Other Standing Knee Exercises standing on foam for balance VCs to look straight ahead; then with head turns. standing with toes on foam beam for COG - no problem   Knee/Hip Exercises: Seated   Sit to Sand 3 sets;10 reps;without UE support                  PT Short Term Goals - 04/10/16 1157    PT SHORT TERM GOAL #1   Title Ind with an initial HEP.   Time 4   Period Weeks  Status New           PT Long Term Goals - 04/10/16 1157    PT LONG TERM GOAL #1   Title Ind with an advanced HEP.   Time 8   Period Weeks   Status New   PT LONG TERM GOAL #2   Title Patient will be able to complete Berg balance test with a score of at least 50 to demonstrate reduced overall fall risk    Time 8   Period Weeks   Status New   PT LONG TERM GOAL #3   Title 5/5 bilateral LE strength grade to improve ease of perfoming functional movements and activites.   Time 8   Period Weeks   Status New   PT LONG TERM GOAL #4   Title Patient to be able to tolerate full 45 minute treatment session of various activities with no rest breaks and minimal fatigue    Time 8   Period Weeks   Status New   PT LONG TERM GOAL #5   Title Patient walk a community distance without rest break.   Time 8   Period Weeks   Status New               Plan - 04/16/16 1602    Clinical Impression Statement Patient did well with TE today and was able to completed with O2 levels at 95 or higher. Standing with good posture on foam was  challenging. He was able to complete sit to stands without UE support. Goals are ongoing.   PT Frequency 2x / week   PT Duration 8 weeks   PT Treatment/Interventions ADLs/Self Care Home Management;Therapeutic exercise;Therapeutic activities;Gait training;Balance training;Neuromuscular re-education;Patient/family education   PT Next Visit Plan Bilateral LE strengthening; gait and balance activites.  Please monitor 02 sat level.   Consulted and Agree with Plan of Care Patient      Patient will benefit from skilled therapeutic intervention in order to improve the following deficits and impairments:  Abnormal gait, Decreased balance, Decreased endurance, Decreased activity tolerance, Decreased strength  Visit Diagnosis: Unsteadiness     Problem List Patient Active Problem List   Diagnosis Date Noted  . Type 2 diabetes mellitus with complication, without long-term current use of insulin ([place]) 04/02/2016  . Gait disturbance 04/02/2016  . Physical deconditioning 04/02/2016  . Pain of both shoulder joints 04/02/2016  . Leg weakness, bilateral 04/02/2016  . Encounter for therapeutic drug monitoring 11/29/2015  . Encounter for health maintenance examination in adult 10/30/2015  . Shoulder pain, bilateral 10/30/2015  . Muscle weakness 10/30/2015  . Generalized weakness 10/30/2015  . Skin lesion 10/30/2015  . PVC (premature ventricular contraction) 10/10/2015  . Diabetes type 2, controlled ([place]) 06/20/2015  . Decreased energy 06/20/2015  . Pulmonary embolism ([place]) 06/20/2015  . DVT (deep venous thrombosis) ([place]) 06/20/2015  . Hammer toe of left foot 06/20/2015  . Pre-ulcerative calluses 06/20/2015  . Hypertrophic toenail 06/20/2015  . Diabetic mononeuropathy associated with diabetes mellitus due to underlying condition ([place]) 06/20/2015  . Leg pain, bilateral 06/20/2015  . Chronic low back pain 06/20/2015  . 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 06/14/2015  . Diabetes type 2, uncontrolled ([place]) 03/06/2015  . OSA on CPAP 03/06/2015  . Edema 03/06/2015  . Leg pain, diffuse 03/06/2015  . Bilateral low back pain without sciatica 03/06/2015  . Tremor 03/06/2015  . Insomnia 03/06/2015  . Vaccine counseling 03/06/2015  . Chronic back pain  03/06/2015  . Wears glasses 03/06/2015  . Hearing loss 03/06/2015  . Spinal stenosis of lumbar region 03/06/2015  . Ptosis 03/06/2015  . Pulmonary emboli ([place]) 03/06/2015  . Positive depression screening 03/06/2015  . Advance directive discussed with patient 03/06/2015  . Bradycardia 02/27/2015  . Long-term (current) use of anticoagulants   . Dementia   . Essential hypertension 02/08/2015  . Hyperlipidemia 02/08/2015  . SOB (shortness of breath) 02/08/2015  . OSA (obstructive sleep apnea) 02/08/2015  . Mild cognitive impairment 02/08/2015  . PE (pulmonary embolism) 01/31/2015    Madelyn Flavors PT  04/16/2016, 4:10 PM  [facility] [address] [place], [place], [address] Phone: [phone]   Fax:  [phone]  Name: ALLANMICHAEL PREISER MRN: [id number] Date of Birth: 1940/12/21

## 2016-04-17 ENCOUNTER — Ambulatory Visit: Payer: Medicare Other | Admitting: Physical Therapy

## 2016-04-18 ENCOUNTER — Ambulatory Visit: Payer: Medicare Other | Admitting: *Deleted

## 2016-04-18 DIAGNOSIS — R2681 Unsteadiness on feet: Secondary | ICD-10-CM

## 2016-04-18 NOTE — Therapy (Signed)
[facility] [place], [place], [address] Phone: [phone]   Fax:  [phone]  Physical Therapy Treatment  Patient Details  Name: Christian [name] MRN: [id number] Date of Birth: 01/19/1941 Referring Provider: Glade Lloyd MD  Encounter Date: 04/18/2016      PT End of Session - 04/18/16 1626    Visit Number 4   Number of Visits 16   Date for PT Re-Evaluation 06/02/16   PT Start Time 1600   PT Stop Time 1630   PT Time Calculation (min) 30 min      Past Medical History  Diagnosis Date  . High cholesterol   . Hypertension   . OSA on CPAP   . Insomnia   . Pulmonary embolism ([place]) 01/2015  . DVT (deep venous thrombosis) ([place]) 01/2015  . Diabetes ([place])     type 2  . Chronic back pain   . Spinal stenosis     lumbar  . Leg pain, diffuse   . Cataract   . Tremor     on propranolol  . Wears glasses   . Hearing impaired   . Mild cognitive impairment     sees [facility]  . Memory loss   . Insomnia   . Numbness     fingers, feet, toes  . Leg swelling   . Confusion   . Agitation   . 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 06/14/2015    Past Surgical History  Procedure Laterality Date  . Tonsillectomy    . Cataract extraction    . Colonoscopy  2014    There were no vitals filed for this visit.      Subjective Assessment - 04/18/16 1600    Subjective Not feeling good today. No energy. Probably a short RX today. Nustep only please. Didn't sleep well last night   Pertinent History Patient was diagnosed with blood clots in bialteral LEs and in lungs (Saddle clot) in March; was in the hospital for 3-4 days. Patient did not receive rehab immediately after getting out of the hospital. Patient states things have been very up and down recently.    How long can you walk comfortably? 10 minutes.   Patient Stated Goals increase energy and quality of life.    Currently  in Pain? No/denies                         [facility] Adult PT Treatment/Exercise - 04/18/16 0001    Exercises   Exercises Knee/Hip   Knee/Hip Exercises: Aerobic   Nustep Level 4 x 25 minutes.seat 12. UE and LE activity    O2  96%                  PT Short Term Goals - 04/10/16 1157    PT SHORT TERM GOAL #1   Title Ind with an initial HEP.   Time 4   Period Weeks   Status New           PT Long Term Goals - 04/10/16 1157    PT LONG TERM GOAL #1   Title Ind with an advanced HEP.   Time 8   Period Weeks   Status New   PT LONG TERM GOAL #2   Title Patient will be able to complete Berg balance test with a score of at least 50 to demonstrate reduced overall fall risk    Time 8  Period Weeks   Status New   PT LONG TERM GOAL #3   Title 5/5 bilateral LE strength grade to improve ease of perfoming functional movements and activites.   Time 8   Period Weeks   Status New   PT LONG TERM GOAL #4   Title Patient to be able to tolerate full 45 minute treatment session of various activities with no rest breaks and minimal fatigue    Time 8   Period Weeks   Status New   PT LONG TERM GOAL #5   Title Patient walk a community distance without rest break.   Time 8   Period Weeks   Status New               Plan - 04/18/16 1628    Clinical Impression Statement Pt did fairly well today with Rx. He only wanted to perform Nustep and denied any // bar exs or act's for balance today. He was able to progress to L4 today on Nustep.  Goals are ongoing   PT Frequency 2x / week   PT Duration 8 weeks   PT Treatment/Interventions ADLs/Self Care Home Management;Therapeutic exercise;Therapeutic activities;Gait training;Balance training;Neuromuscular re-education;Patient/family education   PT Next Visit Plan Bilateral LE strengthening; gait and balance activites.  Please monitor 02 sat level. Try and progress as tolerated   PT Home Exercise Plan Marching in place, sit  to stand, calf raises   Consulted and Agree with Plan of Care Patient      Patient will benefit from skilled therapeutic intervention in order to improve the following deficits and impairments:  Abnormal gait, Decreased balance, Decreased endurance, Decreased activity tolerance, Decreased strength  Visit Diagnosis: Unsteadiness     Problem List Patient Active Problem List   Diagnosis Date Noted  . Type 2 diabetes mellitus with complication, without long-term current use of insulin ([place]) 04/02/2016  . Gait disturbance 04/02/2016  . Physical deconditioning 04/02/2016  . Pain of both shoulder joints 04/02/2016  . Leg weakness, bilateral 04/02/2016  . Encounter for therapeutic drug monitoring 11/29/2015  . Encounter for health maintenance examination in adult 10/30/2015  . Shoulder pain, bilateral 10/30/2015  . Muscle weakness 10/30/2015  . Generalized weakness 10/30/2015  . Skin lesion 10/30/2015  . PVC (premature ventricular contraction) 10/10/2015  . Diabetes type 2, controlled ([place]) 06/20/2015  . Decreased energy 06/20/2015  . Pulmonary embolism ([place]) 06/20/2015  . DVT (deep venous thrombosis) ([place]) 06/20/2015  . Hammer toe of left foot 06/20/2015  . Pre-ulcerative calluses 06/20/2015  . Hypertrophic toenail 06/20/2015  . Diabetic mononeuropathy associated with diabetes mellitus due to underlying condition ([place]) 06/20/2015  . Leg pain, bilateral 06/20/2015  . Chronic low back pain 06/20/2015  . 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 06/14/2015  . Diabetes type 2, uncontrolled ([place]) 03/06/2015  . OSA on CPAP 03/06/2015  . Edema 03/06/2015  . Leg pain, diffuse 03/06/2015  . Bilateral low back pain without sciatica 03/06/2015  . Tremor 03/06/2015  . Insomnia 03/06/2015  . Vaccine counseling 03/06/2015  . Chronic back pain 03/06/2015  . Wears glasses 03/06/2015  . Hearing loss 03/06/2015  . Spinal stenosis of lumbar region  03/06/2015  . Ptosis 03/06/2015  . Pulmonary emboli ([place]) 03/06/2015  . Positive depression screening 03/06/2015  . Advance directive discussed with patient 03/06/2015  . Bradycardia 02/27/2015  . Long-term (current) use of anticoagulants   . Dementia   . Essential hypertension 02/08/2015  . Hyperlipidemia 02/08/2015  .  SOB (shortness of breath) 02/08/2015  . OSA (obstructive sleep apnea) 02/08/2015  . Mild cognitive impairment 02/08/2015  . PE (pulmonary embolism) 01/31/2015    RAMSEUR,CHRIS, PTA 04/18/2016, 4:36 PM  [facility] [address] [place], [place], [address] Phone: [phone]   Fax:  [phone]  Name: DEVORIS WIESE MRN: [id number] Date of Birth: [birth year]

## 2016-04-21 ENCOUNTER — Other Ambulatory Visit: Payer: Self-pay | Admitting: Medical

## 2016-04-21 ENCOUNTER — Other Ambulatory Visit (HOSPITAL_COMMUNITY): Payer: Self-pay | Admitting: Hematology & Oncology

## 2016-04-23 ENCOUNTER — Encounter: Payer: Medicare Other | Admitting: Physical Therapy

## 2016-04-23 NOTE — Telephone Encounter (Signed)
Faxed request rcvd for refill of Invokamet #[phone] to [facility] on [address] [place]. Thanks, RLB

## 2016-04-24 ENCOUNTER — Other Ambulatory Visit (HOSPITAL_COMMUNITY): Payer: Self-pay | Admitting: *Deleted

## 2016-04-24 DIAGNOSIS — D509 Iron deficiency anemia, unspecified: Secondary | ICD-10-CM

## 2016-04-24 MED ORDER — POLYSACCHARIDE IRON COMPLEX 150 MG PO CAPS: 150.0000 mg | ORAL_CAPSULE | Freq: Every day | ORAL | Status: DC

## 2016-04-25 NOTE — Progress Notes (Signed)
Patient is discharged from [place] and Pulmonary program today, 04/11/16 with 24 sessions.  He achieved LTG of 30 minutes of aerobic exercise at max met level of 3.63.   Patient has not met with dietician.  Discharge instructions have been reviewed in detail and patient expressed an understanding of material given.  Patient plans to join a gym near home for follow up exercise.  Cardiac Rehab will make 1 month, 6 month and 1 year call backs.  Patient had no complaints of any abnormal S/S or pain on their exit visit.

## 2016-04-26 ENCOUNTER — Encounter: Payer: Medicare Other | Admitting: Physical Therapy

## 2016-05-06 ENCOUNTER — Other Ambulatory Visit: Payer: Self-pay | Admitting: Internal Medicine

## 2016-05-10 ENCOUNTER — Ambulatory Visit (INDEPENDENT_AMBULATORY_CARE_PROVIDER_SITE_OTHER): Payer: Medicare Other | Admitting: Medical

## 2016-05-10 ENCOUNTER — Encounter: Payer: Self-pay | Admitting: Medical

## 2016-05-10 VITALS — BP 102/68 | HR 80 | Wt 205.0 lb

## 2016-05-10 DIAGNOSIS — R350 Frequency of micturition: Secondary | ICD-10-CM | POA: Diagnosis not present

## 2016-05-10 DIAGNOSIS — R531 Weakness: Secondary | ICD-10-CM

## 2016-05-10 DIAGNOSIS — R251 Tremor, unspecified: Secondary | ICD-10-CM | POA: Diagnosis not present

## 2016-05-10 DIAGNOSIS — M25512 Pain in left shoulder: Secondary | ICD-10-CM | POA: Diagnosis not present

## 2016-05-10 DIAGNOSIS — R300 Dysuria: Secondary | ICD-10-CM | POA: Diagnosis not present

## 2016-05-10 DIAGNOSIS — G3184 Mild cognitive impairment, so stated: Secondary | ICD-10-CM

## 2016-05-10 DIAGNOSIS — E118 Type 2 diabetes mellitus with unspecified complications: Secondary | ICD-10-CM | POA: Diagnosis not present

## 2016-05-10 DIAGNOSIS — R29898 Other symptoms and signs involving the musculoskeletal system: Secondary | ICD-10-CM | POA: Diagnosis not present

## 2016-05-10 DIAGNOSIS — M25511 Pain in right shoulder: Secondary | ICD-10-CM | POA: Diagnosis not present

## 2016-05-10 LAB — POCT URINALYSIS DIPSTICK
BILIRUBIN UA: NEGATIVE
Blood, UA: NEGATIVE
Ketones, UA: NEGATIVE
LEUKOCYTES UA: NEGATIVE
NITRITE UA: NEGATIVE
PH UA: 5.5
Protein, UA: NEGATIVE
Spec Grav, UA: >=1.03
Urobilinogen, UA: NEGATIVE

## 2016-05-10 MED ORDER — DULAGLUTIDE 1.5 MG/0.5ML ~~LOC~~ SOAJ: 1.5000 mg | SUBCUTANEOUS | Status: DC

## 2016-05-10 MED ORDER — HYDROCODONE-ACETAMINOPHEN 5-325 MG PO TABS: ORAL_TABLET | ORAL | Status: DC

## 2016-05-10 MED ORDER — METFORMIN HCL 1000 MG PO TABS: 1000.0000 mg | ORAL_TABLET | Freq: Two times a day (BID) | ORAL | Status: DC

## 2016-05-10 MED ORDER — TAMSULOSIN HCL 0.4 MG PO CAPS: 0.4000 mg | ORAL_CAPSULE | Freq: Every day | ORAL | Status: DC

## 2016-05-10 NOTE — Progress Notes (Signed)
Subjective: Chief Complaint  Patient presents with  . overall weakness    wife stated that he uses a cane, will no longer go to rehab, and wont get out of the chair much at all and will go to sleep around 3pm. doing no brain exercises. fell in the bathtub twice and has a huge bruise on his side. pt states that his body aches from bad fall. said shoulders and back hurts most. stated if he heals from his fall he will go back to pt.    Here for several concerns.  Here with wife.     Having urinary urgency, gets up 3-4 times night to urinate.  No stream weakness.  Still c/o bilat shoulder pains, worse R>L, pain and limited ROM overhead, wife has to help him get shirts on.  Both shoulders continue to hurt, no energy, legs hurt in right lower leg.    Had fall few weeks ago, hasn't recuperated from that.  Fell in bath tub twice recently. Lost balance.    Checks glucose occasionally, usually in the 140s.  Wants to see different neurologists about medications, tremor, memory, weakness.  Only went to [facility] for rehab 3 visits, just stopped going.  Wife says he was using hand bike but no therapy exercises and stretches begin done.  Past Medical History  Diagnosis Date  . High cholesterol   . Hypertension   . OSA on CPAP   . Insomnia   . Pulmonary embolism ([place]) 01/2015  . DVT (deep venous thrombosis) ([place]) 01/2015  . Diabetes ([place])     type 2  . Chronic back pain   . Spinal stenosis     lumbar  . Leg pain, diffuse   . Cataract   . Tremor     on propranolol  . Wears glasses   . Hearing impaired   . Mild cognitive impairment     sees [facility] Neurology  . Memory loss   . Insomnia   . Numbness     fingers, feet, toes  . Leg swelling   . Confusion   . Agitation   . 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 06/14/2015   Current Outpatient Prescriptions on File Prior to Visit  Medication Sig Dispense Refill  . diltiazem (CARDIZEM) 30 MG  tablet Take 1 tablet (30 mg total) by mouth 2 (two) times daily. 60 tablet 11  . donepezil (ARICEPT) 10 MG tablet TAKE 1 TABLET BY MOUTH EVERY NIGHT AT BEDTIME 30 tablet 6  . flecainide (TAMBOCOR) 150 MG tablet TAKE 1/2 TABLET BY MOUTH TWICE DAILY 45 tablet 1  . glucose blood (TRUE METRIX BLOOD GLUCOSE TEST) test strip Use as instructed 100 each 12  . iron polysaccharides (NIFEREX) 150 MG capsule Take 1 capsule (150 mg total) by mouth daily. 30 capsule 6  . Misc Natural Products (OSTEO BI-FLEX ADV JOINT SHIELD PO) Take 1 tablet by mouth daily.     . rivaroxaban (XARELTO) 20 MG TABS tablet Take 1 tablet (20 mg total) by mouth daily with supper. 30 tablet 11  . EPINEPHrine (EPIPEN 2-PAK) 0.3 mg/0.3 mL IJ SOAJ injection Inject 0.3 mLs (0.3 mg total) into the muscle once. (Patient not taking: Reported on 04/01/2016) 1 Device 0   No current facility-administered medications on file prior to visit.   ROS as in subjective   Objective: BP 102/68 mmHg  Pulse 80  Wt 205 lb (92.987 kg)  SpO2 98%  Wt Readings from Last 3  Encounters:  05/10/16 205 lb (92.987 kg)  04/01/16 198 lb (89.812 kg)  02/22/16 206 lb (93.441 kg)   Gen: wd, wn, nad No worrisome skin findings  His gait is somewhat shuffled and cautious He has visible loss of muscle mass in the arms bilat Tender over upper arms and deltoids bilat, tender over right shoulder generalized, tender over right trapezius and supraspinatus, pain with apprehension test bilat, and he is tense and cautious with shoulder exam, is somewhat rigid, somewhat cog wheeling of arm motions.  bilat shoulder ROM is limited to about 100 degrees of flexion due to pain.  No specific tenders over [facility] joint or SCM joint Back: tender lumbar paraspinal region Legs nontender, relatively normal hip ROM, no swelling Neuro: barely 4/5 strength throughout of UE and LE, no obvious tremor at rest, but there is some slight tremor with goal oriented motion of arms Pulses 1+  throughout Neck: supple, no lymphadenopathy, no thyromegaly, no masses Heart: RRR, normal S1, S2, no murmurs Lungs: CTA bilaterally, no wheezes, rhonchi, or rales Psych: answered questions appropriately, A&Ox3     Assessment: Encounter Diagnoses  Name Primary?  . Right shoulder pain Yes  . Pain of both shoulder joints   . Dysuria   . Type 2 diabetes mellitus with complication, without long-term current use of insulin ([place])   . Mild cognitive impairment   . Tremor, unspecified   . Leg weakness, bilateral   . Generalized weakness   . Urinary frequency      Plan: Mild cognitive impairment, tremor, weakness - wants second opinion/consult.  Refer to [facility] neurology  Shoulder pain bilat - referral to orthopedist  Leg weakness, shoulder pain - will call to clarify with PT/rehab to work on therapy for shoulder and leg pain and weakness  Urinary frequency - begin Flomax for possible BPH  Diabetes type 2 - c/t Trulicity weekly, stop Invokamet given new warnings for amputations.  Change to plain metformin/Glucophage    F/u pending labs   Bassem was seen today for overall weakness.  Diagnoses and all orders for this visit:  Right shoulder pain -     Ambulatory referral to Orthopedic Surgery  Pain of both shoulder joints  Dysuria -     POCT urinalysis dipstick  Type 2 diabetes mellitus with complication, without long-term current use of insulin (HCC)  Mild cognitive impairment -     Ambulatory referral to Neurology  Tremor, unspecified -     Ambulatory referral to Neurology  Leg weakness, bilateral  Generalized weakness  Urinary frequency  Other orders -     HYDROcodone-acetaminophen (NORCO/VICODIN) 5-325 MG tablet; 1 tablet 1-2 times daily prn pain -     tamsulosin (FLOMAX) 0.4 MG CAPS capsule; Take 1 capsule (0.4 mg total) by mouth daily. -     Dulaglutide (TRULICITY) 1.5 0000000 SOPN; Inject 1.5 mg into the skin once a week. -     metFORMIN (GLUCOPHAGE)  1000 MG tablet; Take 1 tablet (1,000 mg total) by mouth 2 (two) times daily with a meal.

## 2016-05-16 ENCOUNTER — Telehealth: Payer: Self-pay | Admitting: Neurology

## 2016-05-16 ENCOUNTER — Telehealth: Payer: Self-pay

## 2016-05-16 NOTE — Telephone Encounter (Signed)
Christian [name] 2041-01-02. We received a call from his I believe PCP office. Loma Sousa was wanting to see about getting him in sooner. His wife is afraid of him falling. I was able to move his appointment 1 week sooner. I also added him to the wait list. Her number is [phone]. Thank you

## 2016-05-16 NOTE — Telephone Encounter (Signed)
Tried to call Loma Sousa back with no answer. No other suggestions. Keep on waiting list.

## 2016-05-16 NOTE — Telephone Encounter (Signed)
Pt will not be able to see [facility] until the end of July. Called Neuro to see if they could move appt up and they stated 06/10/16 at 10:15 am. They will try and move him up sooner if they can and putting him on cancellation list. Wife is aware.Pt stated that his sugars are creeping up, and would like to know what he should do? He is drinking protein drinks and that is not helping his sugars. Pt is coming in [place]

## 2016-05-19 NOTE — Progress Notes (Signed)
   Orientation: 12/27/15 Graduate Date: tbd Discharge Date: tbd # of sessions completed: 12  Pulmonologist: [place] Family MD: Tysinger Class Time: 1330  A. Exercise Program: Tolerates exercise @ 3.63 METS for 15 minutes and Walk Test Results: Pre: 2.17 mets  B. Mental Health: Good mental attitude and PHQ-9: 12. Patient refused counseling  C. Education/Instruction/Skills Uses Perceived Exertion Scale and/or Dyspnea Scale Demonstrates accurate pursed lip breathing  D. Nutrition/Weight Control/Body Composition: Adherence to prescribed nutrition program: fair    E. Blood Lipids    Recent Labs    Lab Results  Component Value Date   CHOL 212* 10/30/2015   HDL 39* 10/30/2015   LDLCALC 151* 10/30/2015   TRIG 108 10/30/2015   CHOLHDL 5.4* 10/30/2015      F. Lifestyle Changes: Making positive lifestyle changes and Not smoking: Quit 2006  G. Symptoms noted with exercise: Asymptomatic  Report Completed By: Russella Dar, EP    Comments: This is the patients halfway note for [facility]. Patient is progressing well in the program.

## 2016-05-19 NOTE — Progress Notes (Signed)
   Orientation: 12/27/15 Graduate Date: 04/11/16 Discharge Date: 04/11/16 # of sessions completed: 24  Pulmonologist: Harl Bowie Family MD: Pennie Rushing Time: 1330  A. Exercise Program: Tolerates exercise @ 3.62 METS for 15 minutes and Walk Test Results: Pre: 2.17 mets  B. Mental Health: Good mental attitude and PHQ-9: 12. Patient refused counseling  C. Education/Instruction/Skills Uses Perceived Exertion Scale and/or Dyspnea Scale Demonstrates accurate pursed lip breathing  D. Nutrition/Weight Control/Body Composition: Adherence to prescribed nutrition program: fair    E. Blood Lipids    Recent Labs    Lab Results  Component Value Date   CHOL 212* 10/30/2015   HDL 39* 10/30/2015   LDLCALC 151* 10/30/2015   TRIG 108 10/30/2015   CHOLHDL 5.4* 10/30/2015      F. Lifestyle Changes: Making positive lifestyle changes and Not smoking: Quit 2006  G. Symptoms noted with exercise: Asymptomatic  Report Completed By: Russella Dar, EP    Comments: This is the patients graduation progress note for [facility]. Patient plans to continue his exercise program at his local gym 3 x week.

## 2016-05-19 NOTE — Addendum Note (Signed)
Encounter addended by: Gean Maidens on: 05/19/2016  2:03 PM<BR>     Documentation filed: Clinical Notes

## 2016-05-19 NOTE — Addendum Note (Signed)
Encounter addended by: Gean Maidens on: 05/19/2016  1:45 PM<BR>     Documentation filed: Clinical Notes

## 2016-05-20 ENCOUNTER — Ambulatory Visit: Payer: Medicare Other | Admitting: Medical

## 2016-05-31 DIAGNOSIS — M19012 Primary osteoarthritis, left shoulder: Secondary | ICD-10-CM | POA: Diagnosis not present

## 2016-05-31 DIAGNOSIS — M67911 Unspecified disorder of synovium and tendon, right shoulder: Secondary | ICD-10-CM | POA: Diagnosis not present

## 2016-06-10 ENCOUNTER — Ambulatory Visit: Payer: Medicare Other | Admitting: Neurology

## 2016-06-13 ENCOUNTER — Encounter: Payer: Self-pay | Admitting: Neurology

## 2016-06-17 ENCOUNTER — Ambulatory Visit: Payer: Medicare Other | Admitting: Neurology

## 2016-06-23 ENCOUNTER — Other Ambulatory Visit: Payer: Self-pay | Admitting: Internal Medicine

## 2016-06-23 DIAGNOSIS — I493 Ventricular premature depolarization: Secondary | ICD-10-CM

## 2016-06-24 ENCOUNTER — Telehealth: Payer: Self-pay

## 2016-06-24 NOTE — Telephone Encounter (Signed)
Pt called the office with request of hydrocodone. He is completely out. He has made appt for Wednesday at 1:20PM but is not able to sleep at night due to pain.   Thanks,   He can be reached at [phone]. Victorino December

## 2016-06-24 NOTE — Telephone Encounter (Signed)
At last visit we referred to ortho for this, so they should be treating his pain and shoulder issues.   Please inquire and see if the record is scanned.  I am pretty sure I signed off on ortho notes that came in but don't see them scanned in.

## 2016-06-25 NOTE — Telephone Encounter (Signed)
I'll await notes

## 2016-06-25 NOTE — Telephone Encounter (Signed)
[facility] stated he was seen once and told to follow up PRN, they are faxing over OV and pt has not been going to PT with [facility] as instructed at last visit

## 2016-06-26 ENCOUNTER — Ambulatory Visit (INDEPENDENT_AMBULATORY_CARE_PROVIDER_SITE_OTHER): Payer: Medicare Other | Admitting: Medical

## 2016-06-26 ENCOUNTER — Encounter: Payer: Self-pay | Admitting: Medical

## 2016-06-26 VITALS — BP 110/70 | HR 77 | Wt 214.0 lb

## 2016-06-26 DIAGNOSIS — E118 Type 2 diabetes mellitus with unspecified complications: Secondary | ICD-10-CM

## 2016-06-26 DIAGNOSIS — I10 Essential (primary) hypertension: Secondary | ICD-10-CM

## 2016-06-26 DIAGNOSIS — R531 Weakness: Secondary | ICD-10-CM

## 2016-06-26 DIAGNOSIS — R251 Tremor, unspecified: Secondary | ICD-10-CM | POA: Diagnosis not present

## 2016-06-26 DIAGNOSIS — M25512 Pain in left shoulder: Secondary | ICD-10-CM

## 2016-06-26 DIAGNOSIS — G3184 Mild cognitive impairment, so stated: Secondary | ICD-10-CM

## 2016-06-26 DIAGNOSIS — M545 Low back pain, unspecified: Secondary | ICD-10-CM

## 2016-06-26 DIAGNOSIS — G8929 Other chronic pain: Secondary | ICD-10-CM

## 2016-06-26 DIAGNOSIS — R29898 Other symptoms and signs involving the musculoskeletal system: Secondary | ICD-10-CM | POA: Diagnosis not present

## 2016-06-26 DIAGNOSIS — M25511 Pain in right shoulder: Secondary | ICD-10-CM

## 2016-06-26 DIAGNOSIS — E785 Hyperlipidemia, unspecified: Secondary | ICD-10-CM | POA: Diagnosis not present

## 2016-06-26 DIAGNOSIS — M79606 Pain in leg, unspecified: Secondary | ICD-10-CM

## 2016-06-26 DIAGNOSIS — M25532 Pain in left wrist: Secondary | ICD-10-CM

## 2016-06-26 DIAGNOSIS — M2550 Pain in unspecified joint: Secondary | ICD-10-CM

## 2016-06-26 DIAGNOSIS — Z7901 Long term (current) use of anticoagulants: Secondary | ICD-10-CM

## 2016-06-26 DIAGNOSIS — M2560 Stiffness of unspecified joint, not elsewhere classified: Secondary | ICD-10-CM

## 2016-06-26 LAB — HEMOGLOBIN A1C
HEMOGLOBIN A1C: 8.3 % — AB (ref <5.7)
Mean Plasma Glucose: 192 mg/dL

## 2016-06-26 LAB — URIC ACID: URIC ACID, SERUM: 4.3 mg/dL (ref 4.0–8.0)

## 2016-06-26 MED ORDER — CANAGLIFLOZIN-METFORMIN HCL 150-1000 MG PO TABS: 1.0000 | ORAL_TABLET | Freq: Two times a day (BID) | ORAL | 1 refills | Status: DC

## 2016-06-26 MED ORDER — HYDROCODONE-ACETAMINOPHEN 5-325 MG PO TABS: ORAL_TABLET | ORAL | 0 refills | Status: DC

## 2016-06-26 NOTE — Progress Notes (Signed)
Subjective: Chief Complaint  Patient presents with  . high blood sugars    has not been able to be controlled. 162 was his sugar this morning. will not continue to therapy.  got shots at ortho and helped for 2 days.    Here accompanied by wife for several concerns.  After last visit saw ortho, but they gave him steroids shots in both arms and told him to f/u with PCP, that he didn't need to come back.   However, he still hurts everywhere.  Definitely has pains in both shoulders,left wrist, legs ache throughout, aches all over in general.   Does get morning stiffness.  Does sometimes get swelling in left wrist.   Need handicap placard form today.  Diabetes since coming off Invokamet, sugars have been running high 100s, and he has gained weight.  He felt he was doing much better on Invokamet.  Is still taking trulicity weekly, metformin.  Wife notes he has the new neurology appt soon.  Still having a lot of tremor, gait it shuffled, still has generalized weakness and leg weakness specifically.  Since last visit he stopped Pravachol but this hasn't improved weakness.    He attempted a few more sessions with physical therapy, but this just seems to make him hurt worse.  He does not plan to resume PT.   He is requesting refill on hydrocodone which does give him some relief.  Past Medical History:  Diagnosis Date  . Agitation   . Cataract   . Chronic back pain   . Confusion   . Diabetes ([place])    type 2  . DVT (deep venous thrombosis) ([place]) 01/2015  . Hearing impaired   . High cholesterol   . Hypertension   . Insomnia   . Insomnia   . Leg pain, diffuse   . Leg swelling   . Memory loss   . Mild cognitive impairment    sees [facility] Neurology  . Numbness    fingers, feet, toes  . OSA on CPAP   . 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 06/14/2015  . Pulmonary embolism ([place]) 01/2015  . Spinal stenosis    lumbar  . Tremor    on  propranolol  . Wears glasses    ROS as in subjective    Objective: BP 110/70   Pulse 77   Wt 214 lb (97.1 kg)   BMI 29.85 kg/m   Gen: wd, wn, nad No worrisome skin findings  His gait is somewhat shuffled and cautious He has visible loss of muscle mass in the arms bilat Tender over upper arms and deltoids bilat, tender over right shoulder generalized, tender over right trapezius and supraspinatus, pain with apprehension test bilat, and he is tense and cautious with shoulder exam, is somewhat rigid, somewhat cog wheeling of arm motions.  bilat shoulder ROM is limited to about 100 degrees of flexion due to pain.  No specific tenders over [facility] joint or SCM joint Back: tender lumbar paraspinal region Legs nontender, relatively normal hip ROM, no swelling Neuro: barely 4/5 strength throughout of UE and LE, no obvious tremor at rest, but there is some slight tremor with goal oriented motion of arms Pulses 1+ throughout Neck: supple, no lymphadenopathy, no thyromegaly, no masses Heart: RRR, normal S1, S2, no murmurs Lungs: CTA bilaterally, no wheezes, rhonchi, or rales Psych: answered questions appropriately, A&Ox3   Assessment: Encounter Diagnoses  Name Primary?  . Polyarthralgia Yes  . Morning  joint stiffness   . Left wrist pain   . Generalized weakness   . Essential hypertension   . Type 2 diabetes mellitus with complication, without long-term current use of insulin ([place])   . Mild cognitive impairment   . Leg weakness, bilateral   . Long-term (current) use of anticoagulants   . Hyperlipidemia   . Tremor   . Bilateral low back pain without sciatica   . Leg pain, diffuse, unspecified laterality   . Chronic low back pain   . Pain of both shoulder joints     Plan: Diabetes - Begin back on invokamet, stop plain metformin, c/t trulicity.  C/t glucose monitoring.   Given ongoing pains, joint stiffness, left wrist pain, polyarthralgia, additional labs today HTN - at goal, c/t  same medication F/u as planned with neurology for weakness, tremor Hyperlipidemia - restart statin Handicap placard completed Refilled pain medication, but we will c/t efforts to rule out other disease and find better solutions.  Rykan was seen today for high blood sugars.  Diagnoses and all orders for this visit:  Polyarthralgia -     Sedimentation rate -     ANA -     Cyclic citrul peptide antibody, IgG -     Uric acid  Morning joint stiffness -     Sedimentation rate -     ANA -     Cyclic citrul peptide antibody, IgG -     Uric acid  Left wrist pain -     Sedimentation rate -     ANA -     Cyclic citrul peptide antibody, IgG -     Uric acid  Generalized weakness  Essential hypertension  Type 2 diabetes mellitus with complication, without long-term current use of insulin (HCC) -     Hemoglobin A1c  Mild cognitive impairment  Leg weakness, bilateral  Long-term (current) use of anticoagulants  Hyperlipidemia  Tremor  Bilateral low back pain without sciatica -     Sedimentation rate -     ANA -     Cyclic citrul peptide antibody, IgG -     Uric acid  Leg pain, diffuse, unspecified laterality  Chronic low back pain  Pain of both shoulder joints -     Sedimentation rate -     ANA -     Cyclic citrul peptide antibody, IgG -     Uric acid  Other orders -     HYDROcodone-acetaminophen (NORCO/VICODIN) 5-325 MG tablet; 1 tablet 1-2 times daily prn pain -     Canagliflozin-Metformin HCl (INVOKAMET) [phone] MG TABS; Take 1 tablet by mouth 2 (two) times daily.

## 2016-06-27 ENCOUNTER — Other Ambulatory Visit: Payer: Self-pay | Admitting: Medical

## 2016-06-27 LAB — CYCLIC CITRUL PEPTIDE ANTIBODY, IGG: Cyclic Citrullin Peptide Ab: <16 Units

## 2016-06-27 LAB — SEDIMENTATION RATE: Sed Rate: 12 mm/hr (ref 0–20)

## 2016-06-27 MED ORDER — PRAVASTATIN SODIUM 20 MG PO TABS: 20.0000 mg | ORAL_TABLET | Freq: Every day | ORAL | 3 refills | Status: DC

## 2016-06-28 LAB — ANTI-NUCLEAR AB-TITER (ANA TITER)

## 2016-06-28 LAB — ANA: Anti Nuclear Antibody(ANA): POSITIVE — AB

## 2016-07-08 ENCOUNTER — Telehealth: Payer: Self-pay

## 2016-07-08 DIAGNOSIS — M2550 Pain in unspecified joint: Secondary | ICD-10-CM

## 2016-07-08 NOTE — Telephone Encounter (Signed)
-----   Message from Carlena Hurl, PA-C sent at 07/03/2016  9:22 PM EDT ----- Labs show diabetes marker higher than desired.   The ANA marker was elevated which is nonspecific but he rheumatoid arthritis lab and sed rate marker for inflammation negative.   If desired we can refer to rheumatology for consult on labs and possibly other remedies for his pain.,  Restart the invokamet.  F/u 29mo

## 2016-07-08 NOTE — Telephone Encounter (Signed)
Referral to [facility]
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
## 2016-07-15 ENCOUNTER — Ambulatory Visit (INDEPENDENT_AMBULATORY_CARE_PROVIDER_SITE_OTHER): Payer: Medicare Other | Admitting: Neurology

## 2016-07-15 ENCOUNTER — Encounter: Payer: Self-pay | Admitting: Neurology

## 2016-07-15 VITALS — BP 120/70 | HR 75 | Ht 71.0 in | Wt 213.0 lb

## 2016-07-15 DIAGNOSIS — E1142 Type 2 diabetes mellitus with diabetic polyneuropathy: Secondary | ICD-10-CM | POA: Diagnosis not present

## 2016-07-15 DIAGNOSIS — F0150 Vascular dementia without behavioral disturbance: Secondary | ICD-10-CM | POA: Diagnosis not present

## 2016-07-17 ENCOUNTER — Encounter: Payer: Self-pay | Admitting: Internal Medicine

## 2016-07-24 ENCOUNTER — Ambulatory Visit: Payer: Medicare Other | Admitting: Neurology

## 2016-07-24 ENCOUNTER — Ambulatory Visit: Payer: Medicare Other | Admitting: Nurse Practitioner

## 2016-08-07 ENCOUNTER — Other Ambulatory Visit: Payer: Self-pay | Admitting: Internal Medicine

## 2016-08-16 ENCOUNTER — Telehealth: Payer: Self-pay | Admitting: Medical

## 2016-08-16 NOTE — Telephone Encounter (Signed)
Pt's wife was notified that she can call [facility] again to schedule a follow-up appt

## 2016-08-16 NOTE — Telephone Encounter (Signed)
He should be able to call ortho back himself for f/u.  If needed though, refer back

## 2016-08-16 NOTE — Telephone Encounter (Signed)
Pt wife called and was wanting him to be set up to have cortizone shots in his shoulder, he having some pain, states you set him up before with another dr to have them done, pt can be reached at [phone]

## 2016-08-21 DIAGNOSIS — M4692 Unspecified inflammatory spondylopathy, cervical region: Secondary | ICD-10-CM | POA: Diagnosis not present

## 2016-08-27 ENCOUNTER — Other Ambulatory Visit: Payer: Self-pay | Admitting: Medical

## 2016-08-27 NOTE — Telephone Encounter (Signed)
Is this okay to refill? 

## 2016-09-10 ENCOUNTER — Other Ambulatory Visit: Payer: Self-pay | Admitting: Internal Medicine

## 2016-09-10 ENCOUNTER — Other Ambulatory Visit: Payer: Self-pay | Admitting: Medical

## 2016-09-10 NOTE — Telephone Encounter (Signed)
Is this okay to call in? 

## 2016-09-23 ENCOUNTER — Telehealth: Payer: Self-pay | Admitting: Family Medicine

## 2016-09-23 DIAGNOSIS — R531 Weakness: Secondary | ICD-10-CM

## 2016-09-23 DIAGNOSIS — M25511 Pain in right shoulder: Secondary | ICD-10-CM

## 2016-09-23 DIAGNOSIS — M25512 Pain in left shoulder: Principal | ICD-10-CM

## 2016-09-23 DIAGNOSIS — Z86711 Personal history of pulmonary embolism: Secondary | ICD-10-CM

## 2016-09-23 NOTE — Telephone Encounter (Signed)
Wife called and requested referral to Rehab in [place] as it is much closer.  Pt fell this morning and it took 2 hours to get him back on his feet.  [facility] much closer for them

## 2016-09-23 NOTE — Telephone Encounter (Signed)
Please refer back to [facility] for PT then

## 2016-09-24 NOTE — Telephone Encounter (Signed)
Left message for pt to call me back 

## 2016-09-24 NOTE — Telephone Encounter (Signed)
shane- I am not sure why pt is needing to go PT, I am trying to put the referral in for pt in [place] to pick up in epic.  Referral is pending dx

## 2016-09-24 NOTE — Addendum Note (Signed)
Addended by: Minette Headland A on: 09/24/2016 02:23 PM   Modules accepted: Orders

## 2016-09-25 NOTE — Telephone Encounter (Signed)
Wife called back yesterday at end of day to let us know where to refer him today in [place]. I have put the refeerral in and rehab in [place] will pick it up on epic

## 2016-09-25 NOTE — Addendum Note (Signed)
Addended by: Minette Headland A on: 09/25/2016 10:17 AM   Modules accepted: Orders

## 2016-09-27 ENCOUNTER — Other Ambulatory Visit: Payer: Self-pay | Admitting: Neurology

## 2016-09-30 ENCOUNTER — Ambulatory Visit: Payer: Medicare Other | Attending: Medical | Admitting: Physical Therapy

## 2016-09-30 DIAGNOSIS — M6281 Muscle weakness (generalized): Secondary | ICD-10-CM | POA: Diagnosis not present

## 2016-09-30 DIAGNOSIS — G8929 Other chronic pain: Secondary | ICD-10-CM | POA: Diagnosis not present

## 2016-09-30 DIAGNOSIS — R2681 Unsteadiness on feet: Secondary | ICD-10-CM | POA: Insufficient documentation

## 2016-09-30 DIAGNOSIS — Z23 Encounter for immunization: Secondary | ICD-10-CM | POA: Diagnosis not present

## 2016-09-30 DIAGNOSIS — M25511 Pain in right shoulder: Secondary | ICD-10-CM | POA: Diagnosis not present

## 2016-09-30 DIAGNOSIS — M25512 Pain in left shoulder: Secondary | ICD-10-CM | POA: Diagnosis not present

## 2016-09-30 NOTE — Therapy (Signed)
[facility] [place], [place], [address] Phone: [phone]   Fax:  [phone]  Physical Therapy Evaluation  Patient Details  Name: Christian [name] MRN: [id number] Date of Birth: Nov 21, 1941 Referring Provider: Chana Bode, MD  Encounter Date: 09/30/2016      PT End of Session - 09/30/16 1306    Visit Number 1   Number of Visits 16   Date for PT Re-Evaluation 11/25/16   PT Start Time [id number]   PT Stop Time 1348   PT Time Calculation (min) 42 min   Activity Tolerance Patient tolerated treatment well   Behavior During Therapy [facility] for tasks assessed/performed      Past Medical History:  Diagnosis Date  . Agitation   . Cataract   . Chronic back pain   . Confusion   . Diabetes ([place])    type 2  . DVT (deep venous thrombosis) ([place]) 01/2015  . Hearing impaired   . High cholesterol   . Hypertension   . Insomnia   . Insomnia   . Leg pain, diffuse   . Leg swelling   . Memory loss   . Mild cognitive impairment    sees [facility]  . Numbness    fingers, feet, toes  . OSA on CPAP   . 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 06/14/2015  . Pulmonary embolism ([place]) 01/2015  . Spinal stenosis    lumbar  . Tremor    on propranolol  . Wears glasses     Past Surgical History:  Procedure Laterality Date  . CATARACT EXTRACTION    . COLONOSCOPY  2014  . TONSILLECTOMY      There were no vitals filed for this visit.       Subjective Assessment - 09/30/16 1303    Subjective Patient reports he is here because of his shoulders and back. Patient states that 4 days ago he bent to pick up something and lost his balance falling backwards. He also fell in the shower back in May and thinks he broke ribs back then. He reports B shoulder pain since his blood clot incident in 2016. He is unable to mow the yard on his riding mower because of weakness.   Pertinent History  Patient was diagnosed with blood clots in bialteral LEs and in lungs ([place] clot) in March 2016; DM, OA, stenosis, neuropathy   How long can you stand comfortably? 10 minute  limited by fatigue and pain   How long can you walk comfortably? .25 mile  limited by fatigue   Patient Stated Goals Be able to walk safely, decrease shoulder pain.   Currently in Pain? Yes   Pain Score 4    Pain Location Shoulder   Pain Orientation Right;Left   Pain Descriptors / Indicators Aching   Pain Type Chronic pain   Pain Onset More than a month ago   Pain Frequency Constant   Aggravating Factors  lifting   Pain Relieving Factors meds   Effect of Pain on Daily Activities limited with overhead activity            [facility] PT Assessment - 09/30/16 0001      Assessment   Medical Diagnosis B shoulder pain, weakness, h/o PE   Referring Provider Chana Bode, MD   Onset Date/Surgical Date 01/24/15   Hand Dominance Right     Precautions   Precaution Comments Blood thinner.     Restrictions  Weight Bearing Restrictions No     Balance Screen   Has the patient fallen in the past 6 months Yes   How many times? 2   Has the patient had a decrease in activity level because of a fear of falling?  Yes   Is the patient reluctant to leave their home because of a fear of falling?  No     Home Ecologist residence     Functional Tests   Functional tests --  see partial BERG     Posture/Postural Control   Posture Comments B rounded shoulders, forward head and B hips ER.     ROM / Strength   AROM / PROM / Strength AROM;PROM;Strength     AROM   Overall AROM Comments functonal ROM with B IR, ER and ABD (able to reach behind head and back) and OH like jumping jack   AROM Assessment Site Shoulder   Right/Left Shoulder Right;Left   Right Shoulder Flexion 125 Degrees   Left Shoulder Flexion 120 Degrees     PROM   PROM Assessment Site Shoulder   Right/Left Shoulder  Right;Left   Right Shoulder Flexion 135 Degrees   Left Shoulder Flexion 122 Degrees     Strength   Strength Assessment Site Shoulder;Hip   Right/Left Shoulder Right;Left   Right Shoulder Flexion 4+/5   Right Shoulder Extension 5/5   Right Shoulder ABduction 4-/5   Right Shoulder Internal Rotation 5/5   Right Shoulder External Rotation 4-/5  painful   Left Shoulder Flexion 4-/5  painful   Left Shoulder Extension 5/5   Left Shoulder ABduction 4-/5   Left Shoulder Internal Rotation 5/5   Left Shoulder External Rotation 4-/5   Right/Left Hip Right;Left   Right Hip Flexion 4-/5  4+/5 in supine   Right Hip Extension 4+/5  bridge   Right Hip External Rotation  --   Right Hip ABduction 5/5   Right Hip ADduction 5/5  in hooklying   Left Hip Flexion 4-/5  seated and supine   Left Hip Extension 3-/5  bridge   Left Hip ABduction 5/5  in hooklying   Left Hip ADduction 5/5     Special Tests    Special Tests Rotator Cuff Impingement   Rotator Cuff Impingment tests Michel Bickers test     Hawkins-Kennedy test   Findings Positive   Side Left   Comments neg R     Ambulation/Gait   Gait Comments decreased cadence; uses wall/items to add support     Berg Balance Test   Sit to Stand Able to stand without using hands and stabilize independently   Standing Unsupported Able to stand safely 2 minutes   Sitting with Back Unsupported but Feet Supported on Floor or Stool Able to sit safely and securely 2 minutes   Stand to Sit Sits safely with minimal use of hands   Standing Unsupported with Eyes Closed Able to stand 10 seconds safely   From Standing Position, Turn to Look Behind Over each Shoulder Looks behind one side only/other side shows less weight shift   Turn 360 Degrees Able to turn 360 degrees safely but slowly  LOB upon return   Standing Unsupported, Alternately Place Feet on Step/Stool Able to complete >2 steps/needs minimal assist   Standing on One Leg Able to lift leg  independently and hold equal to or more than 3 seconds  PT Education - 09/30/16 1621    Education provided Yes   Education Details HEP   Person(s) Educated Patient   Methods Explanation;Demonstration   Comprehension Verbalized understanding;Returned demonstration          PT Short Term Goals - 09/30/16 1632      PT SHORT TERM GOAL #1   Title Ind with an initial HEP.   Time 2   Period Weeks   Status New           PT Long Term Goals - 09/30/16 1632      PT LONG TERM GOAL #1   Title Ind with an advanced HEP.   Time 8   Period Weeks   Status New     PT LONG TERM GOAL #2   Title Patient will be able to complete Berg balance test with a score of at least 50 to demonstrate reduced overall fall risk    Time 8   Period Weeks   Status New     PT LONG TERM GOAL #3   Title 5/5 bilateral LE strength grade to improve ease of perfoming functional movements and activites.   Time 8   Period Weeks   Status New     PT LONG TERM GOAL #4   Title Pt to demo improved B shoulder flexion to 140 deg in standing    Time 8   Period Weeks   Status New     PT LONG TERM GOAL #5   Title Patient to report decreased shoulder pain to 2/10 or less with ADLS   Time 8   Period Weeks   Status New               Plan - 09/30/16 1621    Clinical Impression Statement Patient presents today with c/o B shoulder pain and back pain. He also reports 2 falls in the past 6 months. He has weakness in BLE and core. He has functional weakness with SLS activities especially on the RLE. He has B shoulder weakness and pain as well as decreased ROM into flexion B. Patient will benefit from shoulder ROM, strengthening and pain control.  Additionally, he will benefit from BLE strengthening to improve safety with ambulation. PT recommended use of cane and/or walker.   Rehab Potential Good   PT Frequency 2x / week   PT Duration 6 weeks   PT  Treatment/Interventions ADLs/Self Care Home Management;Therapeutic exercise;Therapeutic activities;Gait training;Balance training;Neuromuscular re-education;Patient/family education;Electrical Stimulation;Cryotherapy;Moist Heat;Manual techniques;Passive range of motion;Dry needling   PT Next Visit Plan ROM for B shoulder flexion; isometrics, PRE, scap stab; Bilateral LE strengthening; gait and balance activities including SL and core.  Monitor O2 sats.   PT Home Exercise Plan Wall slides for flexion   Consulted and Agree with Plan of Care Patient      Patient will benefit from skilled therapeutic intervention in order to improve the following deficits and impairments:  Decreased range of motion, Difficulty walking, Pain, Decreased activity tolerance, Decreased balance, Decreased strength, Postural dysfunction  Visit Diagnosis: Unsteadiness - Plan: PT plan of care cert/re-cert  Chronic left shoulder pain - Plan: PT plan of care cert/re-cert  Chronic right shoulder pain - Plan: PT plan of care cert/re-cert  Muscle weakness (generalized) - Plan: PT plan of care cert/re-cert      G-Codes - [phone]    Functional Assessment Tool Used Clinical judgement - shoulder   Functional Limitation Other PT primary   Mobility: Walking and Moving Around Current Status (  [id number]) At least 40 percent but less than 60 percent impaired, limited or restricted   Mobility: Walking and Moving Around Goal Status [phone]) At least 1 percent but less than 20 percent impaired, limited or restricted       Problem List Patient Active Problem List   Diagnosis Date Noted  . Left wrist pain 06/26/2016  . Morning joint stiffness 06/26/2016  . Polyarthralgia 06/26/2016  . Type 2 diabetes mellitus with complication, without long-term current use of insulin ([place]) 04/02/2016  . Gait disturbance 04/02/2016  . Physical deconditioning 04/02/2016  . Pain of both shoulder joints 04/02/2016  . Leg weakness, bilateral  04/02/2016  . Encounter for therapeutic drug monitoring 11/29/2015  . Encounter for health maintenance examination in adult 10/30/2015  . Shoulder pain, bilateral 10/30/2015  . Muscle weakness 10/30/2015  . Generalized weakness 10/30/2015  . Skin lesion 10/30/2015  . PVC (premature ventricular contraction) 10/10/2015  . Diabetes type 2, controlled ([place]) 06/20/2015  . Decreased energy 06/20/2015  . Pulmonary embolism ([place]) 06/20/2015  . DVT (deep venous thrombosis) ([place]) 06/20/2015  . Hammer toe of left foot 06/20/2015  . Pre-ulcerative calluses 06/20/2015  . Hypertrophic toenail 06/20/2015  . Diabetic mononeuropathy associated with diabetes mellitus due to underlying condition ([place]) 06/20/2015  . Leg pain, bilateral 06/20/2015  . Chronic low back pain 06/20/2015  . 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 06/14/2015  . Diabetes type 2, uncontrolled ([place]) 03/06/2015  . OSA on CPAP 03/06/2015  . Edema 03/06/2015  . Leg pain, diffuse 03/06/2015  . Bilateral low back pain without sciatica 03/06/2015  . Tremor 03/06/2015  . Insomnia 03/06/2015  . Vaccine counseling 03/06/2015  . Chronic back pain 03/06/2015  . Wears glasses 03/06/2015  . Hearing loss 03/06/2015  . Spinal stenosis of lumbar region 03/06/2015  . Ptosis 03/06/2015  . Pulmonary emboli ([place]) 03/06/2015  . Positive depression screening 03/06/2015  . Advance directive discussed with patient 03/06/2015  . Bradycardia 02/27/2015  . Long-term (current) use of anticoagulants   . Dementia   . Essential hypertension 02/08/2015  . Hyperlipidemia 02/08/2015  . SOB (shortness of breath) 02/08/2015  . OSA (obstructive sleep apnea) 02/08/2015  . Mild cognitive impairment 02/08/2015  . PE (pulmonary embolism) 01/31/2015   Madelyn Flavors PT 09/30/2016, 4:41 PM  [facility] [address] [place], [place], [address] Phone: [phone]    Fax:  [phone]  Name: Christian [name] MRN: [id number] Date of Birth: 12-Jan-1941

## 2016-10-02 ENCOUNTER — Ambulatory Visit: Payer: Medicare Other | Admitting: Physical Therapy

## 2016-10-02 ENCOUNTER — Encounter: Payer: Self-pay | Admitting: Physical Therapy

## 2016-10-02 DIAGNOSIS — M6281 Muscle weakness (generalized): Secondary | ICD-10-CM | POA: Diagnosis not present

## 2016-10-02 DIAGNOSIS — M25512 Pain in left shoulder: Secondary | ICD-10-CM

## 2016-10-02 DIAGNOSIS — G8929 Other chronic pain: Secondary | ICD-10-CM | POA: Diagnosis not present

## 2016-10-02 DIAGNOSIS — R2681 Unsteadiness on feet: Secondary | ICD-10-CM

## 2016-10-02 DIAGNOSIS — M25511 Pain in right shoulder: Secondary | ICD-10-CM

## 2016-10-02 NOTE — Therapy (Signed)
[facility] [place], [place], [address] Phone: [phone]   Fax:  [phone]  Physical Therapy Treatment  Patient Details  Name: Christian [name] MRN: [id number] Date of Birth: 16-Dec-1940 Referring Provider: Chana Bode, MD  Encounter Date: 10/02/2016      PT End of Session - 10/02/16 1356    Visit Number 2   Date for PT Re-Evaluation 11/25/16   PT Start Time 1312   PT Stop Time 1356   PT Time Calculation (min) 44 min   Activity Tolerance Patient tolerated treatment well   Behavior During Therapy [facility] for tasks assessed/performed      Past Medical History:  Diagnosis Date  . Agitation   . Cataract   . Chronic back pain   . Confusion   . Diabetes ([place])    type 2  . DVT (deep venous thrombosis) ([place]) 01/2015  . Hearing impaired   . High cholesterol   . Hypertension   . Insomnia   . Insomnia   . Leg pain, diffuse   . Leg swelling   . Memory loss   . Mild cognitive impairment    sees [facility] Neurology  . Numbness    fingers, feet, toes  . OSA on CPAP   . 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 06/14/2015  . Pulmonary embolism ([place]) 01/2015  . Spinal stenosis    lumbar  . Tremor    on propranolol  . Wears glasses     Past Surgical History:  Procedure Laterality Date  . CATARACT EXTRACTION    . COLONOSCOPY  2014  . TONSILLECTOMY      There were no vitals filed for this visit.      Subjective Assessment - 10/02/16 1315    Subjective Patient arrived and reported some pain in bil shoulders, difficulty getting in and out of bed and difficulty with balance. Patient had no assisitvive device upon arrival.   Pertinent History Patient was diagnosed with blood clots in bialteral LEs and in lungs (Saddle clot) in March 2016; DM, OA, stenosis, neuropathy   How long can you stand comfortably? 10 minute   How long can you walk comfortably? .25 mile   Patient  Stated Goals Be able to walk safely, decrease shoulder pain.   Currently in Pain? Yes   Pain Score 5    Pain Location Shoulder   Pain Orientation Right;Left   Pain Descriptors / Indicators Aching   Pain Type Chronic pain   Pain Onset More than a month ago   Pain Frequency Constant   Aggravating Factors  lifting   Pain Relieving Factors rest and meds                         [facility] Adult PT Treatment/Exercise - 10/02/16 0001      Exercises   Exercises Knee/Hip;Lumbar;Shoulder     Lumbar Exercises: Supine   Bridge 3 seconds  2x10     Knee/Hip Exercises: Aerobic   Nustep 17min L3 UE/LE activity  monitored O2 which was 98% throghout     Knee/Hip Exercises: Supine   Straight Leg Raises Strengthening;Both;20 reps     Shoulder Exercises: Seated   Row Strengthening;Both;Theraband  2x10 with red t-band   External Rotation Both;Strengthening;Theraband  2x10   Theraband Level (Shoulder External Rotation) Level 1 (Yellow)     Shoulder Exercises: Pulleys   Flexion 3 minutes  Balance Exercises - 10/02/16 1334      Balance Exercises: Standing   Standing Eyes Opened Wide (BOA);Foam/compliant surface;Time  62min   Rockerboard Anterior/posterior  54min   Step Ups 6 inch;Forward;UE support 1  2x10   Retro Gait Upper extremity support;5 reps   Sidestepping Upper extremity support;5 reps   Sit to Stand Time x10             PT Short Term Goals - 09/30/16 1632      PT SHORT TERM GOAL #1   Title Ind with an initial HEP.   Time 2   Period Weeks   Status New           PT Long Term Goals - 09/30/16 1632      PT LONG TERM GOAL #1   Title Ind with an advanced HEP.   Time 8   Period Weeks   Status New     PT LONG TERM GOAL #2   Title Patient will be able to complete Berg balance test with a score of at least 50 to demonstrate reduced overall fall risk    Time 8   Period Weeks   Status New     PT LONG TERM GOAL #3   Title 5/5  bilateral LE strength grade to improve ease of perfoming functional movements and activites.   Time 8   Period Weeks   Status New     PT LONG TERM GOAL #4   Title Pt to demo improved B shoulder flexion to 140 deg in standing    Time 8   Period Weeks   Status New     PT LONG TERM GOAL #5   Title Patient to report decreased shoulder pain to 2/10 or less with ADLS   Time 8   Period Weeks   Status New               Plan - 10/02/16 1402    Clinical Impression Statement Patient progressing today with good tolerance to treatment andno LOB. Patient reports not doing any exercises at home and sits a lot. Patient understands fall prevention and techniques to avoid falls. Patient O2 WNL throughout today. Goals ongoing due to strength, ROM and balance deficits.   Rehab Potential Good   PT Frequency 2x / week   PT Duration 6 weeks   PT Treatment/Interventions ADLs/Self Care Home Management;Therapeutic exercise;Therapeutic activities;Gait training;Balance training;Neuromuscular re-education;Patient/family education;Electrical Stimulation;Cryotherapy;Moist Heat;Manual techniques;Passive range of motion;Dry needling   PT Next Visit Plan cont with POC for ROM for B shoulder flexion; isometrics, PRE, scap stab; Bilateral LE strengthening; gait and balance activities including SL and core.  Monitor O2 sats.   PT Home Exercise Plan HEP next treatment   Consulted and Agree with Plan of Care Patient      Patient will benefit from skilled therapeutic intervention in order to improve the following deficits and impairments:  Decreased range of motion, Difficulty walking, Pain, Decreased activity tolerance, Decreased balance, Decreased strength, Postural dysfunction  Visit Diagnosis: Unsteadiness  Chronic left shoulder pain  Chronic right shoulder pain  Muscle weakness (generalized)     Problem List Patient Active Problem List   Diagnosis Date Noted  . Left wrist pain 06/26/2016  .  Morning joint stiffness 06/26/2016  . Polyarthralgia 06/26/2016  . Type 2 diabetes mellitus with complication, without long-term current use of insulin ([place]) 04/02/2016  . Gait disturbance 04/02/2016  . Physical deconditioning 04/02/2016  . Pain of both shoulder joints 04/02/2016  .  Leg weakness, bilateral 04/02/2016  . Encounter for therapeutic drug monitoring 11/29/2015  . Encounter for health maintenance examination in adult 10/30/2015  . Shoulder pain, bilateral 10/30/2015  . Muscle weakness 10/30/2015  . Generalized weakness 10/30/2015  . Skin lesion 10/30/2015  . PVC (premature ventricular contraction) 10/10/2015  . Diabetes type 2, controlled ([place]) 06/20/2015  . Decreased energy 06/20/2015  . Pulmonary embolism ([place]) 06/20/2015  . DVT (deep venous thrombosis) ([place]) 06/20/2015  . Hammer toe of left foot 06/20/2015  . Pre-ulcerative calluses 06/20/2015  . Hypertrophic toenail 06/20/2015  . Diabetic mononeuropathy associated with diabetes mellitus due to underlying condition ([place]) 06/20/2015  . Leg pain, bilateral 06/20/2015  . Chronic low back pain 06/20/2015  . 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 06/14/2015  . Diabetes type 2, uncontrolled ([place]) 03/06/2015  . OSA on CPAP 03/06/2015  . Edema 03/06/2015  . Leg pain, diffuse 03/06/2015  . Bilateral low back pain without sciatica 03/06/2015  . Tremor 03/06/2015  . Insomnia 03/06/2015  . Vaccine counseling 03/06/2015  . Chronic back pain 03/06/2015  . Wears glasses 03/06/2015  . Hearing loss 03/06/2015  . Spinal stenosis of lumbar region 03/06/2015  . Ptosis 03/06/2015  . Pulmonary emboli ([place]) 03/06/2015  . Positive depression screening 03/06/2015  . Advance directive discussed with patient 03/06/2015  . Bradycardia 02/27/2015  . Long-term (current) use of anticoagulants   . Dementia   . Essential hypertension 02/08/2015  . Hyperlipidemia 02/08/2015  . SOB (shortness  of breath) 02/08/2015  . OSA (obstructive sleep apnea) 02/08/2015  . Mild cognitive impairment 02/08/2015  . PE (pulmonary embolism) 01/31/2015    Phillips Climes, PTA 10/02/2016, 2:08 PM  [facility] [place], [place], [address] Phone: [phone]   Fax:  [phone]  Name: Christian [name] MRN: [id number] Date of Birth: May 13, 1941

## 2016-10-07 ENCOUNTER — Ambulatory Visit: Payer: Medicare Other | Admitting: Physical Therapy

## 2016-10-07 ENCOUNTER — Encounter: Payer: Self-pay | Admitting: Physical Therapy

## 2016-10-07 DIAGNOSIS — M25511 Pain in right shoulder: Secondary | ICD-10-CM

## 2016-10-07 DIAGNOSIS — M6281 Muscle weakness (generalized): Secondary | ICD-10-CM

## 2016-10-07 DIAGNOSIS — R2681 Unsteadiness on feet: Secondary | ICD-10-CM

## 2016-10-07 DIAGNOSIS — M25512 Pain in left shoulder: Secondary | ICD-10-CM

## 2016-10-07 DIAGNOSIS — G8929 Other chronic pain: Secondary | ICD-10-CM | POA: Diagnosis not present

## 2016-10-07 NOTE — Therapy (Signed)
[facility] [place], [place], [address] Phone: [phone]   Fax:  [phone]  Physical Therapy Treatment  Patient Details  Name: Christian [name] MRN: [id number] Date of Birth: [birth year] Referring Provider: Chana Bode, MD  Encounter Date: 10/07/2016      PT End of Session - 10/07/16 1309    Visit Number 3   Number of Visits 16   Date for PT Re-Evaluation 11/25/16   PT Start Time 1230   PT Stop Time 1310   PT Time Calculation (min) 40 min   Activity Tolerance Patient tolerated treatment well   Behavior During Therapy [facility] for tasks assessed/performed      Past Medical History:  Diagnosis Date  . Agitation   . Cataract   . Chronic back pain   . Confusion   . Diabetes ([place])    type 2  . DVT (deep venous thrombosis) ([place]) 01/2015  . Hearing impaired   . High cholesterol   . Hypertension   . Insomnia   . Insomnia   . Leg pain, diffuse   . Leg swelling   . Memory loss   . Mild cognitive impairment    sees [facility] Neurology  . Numbness    fingers, feet, toes  . OSA on CPAP   . 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 06/14/2015  . Pulmonary embolism ([place]) 01/2015  . Spinal stenosis    lumbar  . Tremor    on propranolol  . Wears glasses     Past Surgical History:  Procedure Laterality Date  . CATARACT EXTRACTION    . COLONOSCOPY  2014  . TONSILLECTOMY      There were no vitals filed for this visit.      Subjective Assessment - 10/07/16 1231    Subjective I feel "fair". still wtih pain in shoulders. pt able to gait in clinic without assistive device   Pertinent History Patient was diagnosed with blood clots in bialteral LEs and in lungs (Saddle clot) in March 2016; DM, OA, stenosis, neuropathy   How long can you walk comfortably? .25 mile   Patient Stated Goals Be able to walk safely, decrease shoulder pain.   Pain Score 5    Pain Location  Shoulder   Pain Orientation Left;Right   Pain Descriptors / Indicators Aching   Pain Type Chronic pain   Pain Onset More than a month ago   Pain Frequency Constant                         [facility] Adult PT Treatment/Exercise - 10/07/16 0001      High Level Balance   High Level Balance Activities Side stepping;Backward walking   High Level Balance Comments with close supervision 20' x 3 each bkwd and sideways     Knee/Hip Exercises: Aerobic   Nustep level 4 x 10 mins with UEs and LEs  spO2 98% throughout     Knee/Hip Exercises: Standing   Rocker Board 2 minutes  for balance   Other Standing Knee Exercises standing on blue foam with wt shifts 30 seconds x 3     Shoulder Exercises: Seated   Retraction Strengthening;Both;15 reps  with manual cues   Row Strengthening;Both;20 reps  red t band, 1 UE at a time for form   Horizontal ABduction Strengthening;Both;20 reps;Theraband   Theraband Level (Shoulder Horizontal ABduction) Level 2 (Red)   Other Seated Exercises D2  with red t band x 12 bilat  cues for form     Manual Therapy   Manual Therapy Soft tissue mobilization   Soft tissue mobilization trigger point release and soft tissue mobs to Rt upper trap, levator and rhomboids                  PT Short Term Goals - 10/07/16 1311      PT SHORT TERM GOAL #1   Title Ind with an initial HEP.   Time 2   Period Weeks   Status On-going     PT SHORT TERM GOAL #2   Time 4   Period Weeks   Status On-going           PT Long Term Goals - 10/07/16 1311      PT LONG TERM GOAL #1   Title Ind with an advanced HEP.   Time 8   Period Weeks   Status On-going     PT LONG TERM GOAL #2   Title Patient will be able to complete Berg balance test with a score of at least 50 to demonstrate reduced overall fall risk    Time 8   Period Weeks   Status On-going     PT LONG TERM GOAL #3   Title 5/5 bilateral LE strength grade to improve ease of perfoming  functional movements and activites.   Time 8   Period Weeks   Status On-going     PT LONG TERM GOAL #4   Title Pt to demo improved B shoulder flexion to 140 deg in standing    Time 8   Period Weeks   Status On-going     PT LONG TERM GOAL #5   Title Patient to report decreased shoulder pain to 2/10 or less with ADLS   Time 8   Period Weeks   Status On-going               Plan - 10/07/16 1310    Clinical Impression Statement Pt progressing well with standing balance. Difficulty sitting for UE exercises without LE support, good for core strength. Pt with significant decreased soft tissue mobility in shoulder mm, will benefti from continued manual therapy   Rehab Potential Good   PT Frequency 2x / week   PT Duration 6 weeks   PT Treatment/Interventions ADLs/Self Care Home Management;Therapeutic exercise;Therapeutic activities;Gait training;Balance training;Neuromuscular re-education;Patient/family education;Electrical Stimulation;Cryotherapy;Moist Heat;Manual techniques;Passive range of motion;Dry needling   PT Next Visit Plan progress shoulder therex and balance as tolerated. manual as needed   PT Home Exercise Plan HEP next treatment   Consulted and Agree with Plan of Care Patient      Patient will benefit from skilled therapeutic intervention in order to improve the following deficits and impairments:  Decreased range of motion, Difficulty walking, Pain, Decreased activity tolerance, Decreased balance, Decreased strength, Postural dysfunction  Visit Diagnosis: Unsteadiness  Chronic left shoulder pain  Chronic right shoulder pain  Muscle weakness (generalized)     Problem List Patient Active Problem List   Diagnosis Date Noted  . Left wrist pain 06/26/2016  . Morning joint stiffness 06/26/2016  . Polyarthralgia 06/26/2016  . Type 2 diabetes mellitus with complication, without long-term current use of insulin ([place]) 04/02/2016  . Gait disturbance 04/02/2016  .  Physical deconditioning 04/02/2016  . Pain of both shoulder joints 04/02/2016  . Leg weakness, bilateral 04/02/2016  . Encounter for therapeutic drug monitoring 11/29/2015  . Encounter for health maintenance examination in  adult 10/30/2015  . Shoulder pain, bilateral 10/30/2015  . Muscle weakness 10/30/2015  . Generalized weakness 10/30/2015  . Skin lesion 10/30/2015  . PVC (premature ventricular contraction) 10/10/2015  . Diabetes type 2, controlled ([place]) 06/20/2015  . Decreased energy 06/20/2015  . Pulmonary embolism ([place]) 06/20/2015  . DVT (deep venous thrombosis) ([place]) 06/20/2015  . Hammer toe of left foot 06/20/2015  . Pre-ulcerative calluses 06/20/2015  . Hypertrophic toenail 06/20/2015  . Diabetic mononeuropathy associated with diabetes mellitus due to underlying condition ([place]) 06/20/2015  . Leg pain, bilateral 06/20/2015  . Chronic low back pain 06/20/2015  . 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 06/14/2015  . Diabetes type 2, uncontrolled ([place]) 03/06/2015  . OSA on CPAP 03/06/2015  . Edema 03/06/2015  . Leg pain, diffuse 03/06/2015  . Bilateral low back pain without sciatica 03/06/2015  . Tremor 03/06/2015  . Insomnia 03/06/2015  . Vaccine counseling 03/06/2015  . Chronic back pain 03/06/2015  . Wears glasses 03/06/2015  . Hearing loss 03/06/2015  . Spinal stenosis of lumbar region 03/06/2015  . Ptosis 03/06/2015  . Pulmonary emboli ([place]) 03/06/2015  . Positive depression screening 03/06/2015  . Advance directive discussed with patient 03/06/2015  . Bradycardia 02/27/2015  . Long-term (current) use of anticoagulants   . Dementia   . Essential hypertension 02/08/2015  . Hyperlipidemia 02/08/2015  . SOB (shortness of breath) 02/08/2015  . OSA (obstructive sleep apnea) 02/08/2015  . Mild cognitive impairment 02/08/2015  . PE (pulmonary embolism) 01/31/2015    Isabelle Course, PT, DPT 10/07/2016, 1:13 PM  [facility] [address] [place], [place], [address] Phone: [phone]   Fax:  [phone]  Name: JAMAURI MORAND MRN: [id number] Date of Birth: 1941-11-11

## 2016-10-09 ENCOUNTER — Encounter: Payer: Self-pay | Admitting: Physical Therapy

## 2016-10-09 ENCOUNTER — Ambulatory Visit: Payer: Medicare Other | Admitting: Physical Therapy

## 2016-10-09 DIAGNOSIS — M25512 Pain in left shoulder: Secondary | ICD-10-CM

## 2016-10-09 DIAGNOSIS — M6281 Muscle weakness (generalized): Secondary | ICD-10-CM

## 2016-10-09 DIAGNOSIS — G8929 Other chronic pain: Secondary | ICD-10-CM

## 2016-10-09 DIAGNOSIS — R2681 Unsteadiness on feet: Secondary | ICD-10-CM

## 2016-10-09 DIAGNOSIS — M25511 Pain in right shoulder: Secondary | ICD-10-CM

## 2016-10-09 NOTE — Therapy (Unsigned)
[facility] [place], [place], [address] Phone: [phone]   Fax:  [phone]  Physical Therapy Treatment  Patient Details  Name: Christian [name] MRN: [id number] Date of Birth: [birth year] Referring Provider: Chana Bode, MD  Encounter Date: 10/09/2016      PT End of Session - 10/09/16 1350    Visit Number 4   Number of Visits 16   Date for PT Re-Evaluation 11/25/16   PT Start Time [id number]   PT Stop Time 1400   PT Time Calculation (min) 42 min   Activity Tolerance Patient tolerated treatment well   Behavior During Therapy [facility] for tasks assessed/performed      Past Medical History:  Diagnosis Date  . Agitation   . Cataract   . Chronic back pain   . Confusion   . Diabetes ([place])    type 2  . DVT (deep venous thrombosis) ([place]) 01/2015  . Hearing impaired   . High cholesterol   . Hypertension   . Insomnia   . Insomnia   . Leg pain, diffuse   . Leg swelling   . Memory loss   . Mild cognitive impairment    sees [facility] Neurology  . Numbness    fingers, feet, toes  . OSA on CPAP   . 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 06/14/2015  . Pulmonary embolism ([place]) 01/2015  . Spinal stenosis    lumbar  . Tremor    on propranolol  . Wears glasses     Past Surgical History:  Procedure Laterality Date  . CATARACT EXTRACTION    . COLONOSCOPY  2014  . TONSILLECTOMY      There were no vitals filed for this visit.      Subjective Assessment - 10/09/16 1330    Subjective Patient reported no change thus far "shoulders still sore"   Pertinent History Patient was diagnosed with blood clots in bialteral LEs and in lungs (Saddle clot) in March 2016; DM, OA, stenosis, neuropathy   How long can you stand comfortably? 10 minute   How long can you walk comfortably? .25 mile   Patient Stated Goals Be able to walk safely, decrease shoulder pain.   Currently in Pain? Yes    Pain Score 5    Pain Location Shoulder   Pain Orientation Left;Right   Pain Descriptors / Indicators Aching   Pain Type Chronic pain   Pain Onset More than a month ago   Aggravating Factors  lifting   Pain Relieving Factors rest and meds                         [facility] Adult PT Treatment/Exercise - 10/09/16 0001      Knee/Hip Exercises: Aerobic   Nustep level 4 x 10 mins with UEs and LEs     Shoulder Exercises: Seated   Retraction Strengthening;Both;Theraband  pink XTS 2x10   External Rotation Both;Strengthening;Theraband  2x10   Theraband Level (Shoulder External Rotation) Level 1 (Yellow)     Shoulder Exercises: Pulleys   Flexion 3 minutes             Balance Exercises - 10/09/16 1342      Balance Exercises: Standing   Standing Eyes Opened Wide (BOA);Foam/compliant surface;Time  65min   Rockerboard Anterior/posterior  52min   Step Ups 6 inch;Forward;UE support 1  2x10   Sit to Stand Time x10   Other Standing  Exercises resisted walking with pink XTS forward/backward             PT Short Term Goals - 10/07/16 1311      PT SHORT TERM GOAL #1   Title Ind with an initial HEP.   Time 2   Period Weeks   Status On-going     PT SHORT TERM GOAL #2   Time 4   Period Weeks   Status On-going           PT Long Term Goals - 10/07/16 1311      PT LONG TERM GOAL #1   Title Ind with an advanced HEP.   Time 8   Period Weeks   Status On-going     PT LONG TERM GOAL #2   Title Patient will be able to complete Berg balance test with a score of at least 50 to demonstrate reduced overall fall risk    Time 8   Period Weeks   Status On-going     PT LONG TERM GOAL #3   Title 5/5 bilateral LE strength grade to improve ease of perfoming functional movements and activites.   Time 8   Period Weeks   Status On-going     PT LONG TERM GOAL #4   Title Pt to demo improved B shoulder flexion to 140 deg in standing    Time 8   Period Weeks    Status On-going     PT LONG TERM GOAL #5   Title Patient to report decreased shoulder pain to 2/10 or less with ADLS   Time 8   Period Weeks   Status On-going               Plan - 10/09/16 1352    Clinical Impression Statement Patient progressing with all activities today with no LOB and no falls reported. Patient requires cues for technique for all exercises today. Patient able to progress with balance activities with resisted walking CGA/SBA. Patient current goals ongoing due to strength and balance deficts.   Rehab Potential Good   PT Frequency 2x / week   PT Duration 6 weeks   PT Treatment/Interventions ADLs/Self Care Home Management;Therapeutic exercise;Therapeutic activities;Gait training;Balance training;Neuromuscular re-education;Patient/family education;Electrical Stimulation;Cryotherapy;Moist Heat;Manual techniques;Passive range of motion;Dry needling   PT Next Visit Plan progress shoulder therex and balance as tolerated. manual as needed   PT Home Exercise Plan HEP next treatment   Consulted and Agree with Plan of Care Patient      Patient will benefit from skilled therapeutic intervention in order to improve the following deficits and impairments:  Decreased range of motion, Difficulty walking, Pain, Decreased activity tolerance, Decreased balance, Decreased strength, Postural dysfunction  Visit Diagnosis: Unsteadiness  Chronic left shoulder pain  Chronic right shoulder pain  Muscle weakness (generalized)     Problem List Patient Active Problem List   Diagnosis Date Noted  . Left wrist pain 06/26/2016  . Morning joint stiffness 06/26/2016  . Polyarthralgia 06/26/2016  . Type 2 diabetes mellitus with complication, without long-term current use of insulin ([place]) 04/02/2016  . Gait disturbance 04/02/2016  . Physical deconditioning 04/02/2016  . Pain of both shoulder joints 04/02/2016  . Leg weakness, bilateral 04/02/2016  . Encounter for therapeutic drug  monitoring 11/29/2015  . Encounter for health maintenance examination in adult 10/30/2015  . Shoulder pain, bilateral 10/30/2015  . Muscle weakness 10/30/2015  . Generalized weakness 10/30/2015  . Skin lesion 10/30/2015  . PVC (premature ventricular contraction) 10/10/2015  .  Diabetes type 2, controlled ([place]) 06/20/2015  . Decreased energy 06/20/2015  . Pulmonary embolism ([place]) 06/20/2015  . DVT (deep venous thrombosis) ([place]) 06/20/2015  . Hammer toe of left foot 06/20/2015  . Pre-ulcerative calluses 06/20/2015  . Hypertrophic toenail 06/20/2015  . Diabetic mononeuropathy associated with diabetes mellitus due to underlying condition ([place]) 06/20/2015  . Leg pain, bilateral 06/20/2015  . Chronic low back pain 06/20/2015  . 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 06/14/2015  . Diabetes type 2, uncontrolled ([place]) 03/06/2015  . OSA on CPAP 03/06/2015  . Edema 03/06/2015  . Leg pain, diffuse 03/06/2015  . Bilateral low back pain without sciatica 03/06/2015  . Tremor 03/06/2015  . Insomnia 03/06/2015  . Vaccine counseling 03/06/2015  . Chronic back pain 03/06/2015  . Wears glasses 03/06/2015  . Hearing loss 03/06/2015  . Spinal stenosis of lumbar region 03/06/2015  . Ptosis 03/06/2015  . Pulmonary emboli ([place]) 03/06/2015  . Positive depression screening 03/06/2015  . Advance directive discussed with patient 03/06/2015  . Bradycardia 02/27/2015  . Long-term (current) use of anticoagulants   . Dementia   . Essential hypertension 02/08/2015  . Hyperlipidemia 02/08/2015  . SOB (shortness of breath) 02/08/2015  . OSA (obstructive sleep apnea) 02/08/2015  . Mild cognitive impairment 02/08/2015  . PE (pulmonary embolism) 01/31/2015    Lavonne Cass P, PTA 10/09/2016, 2:01 PM   Ladean Raya, PTA 10/09/16 2:01 PM  [facility] [place], [place], [address] Phone:  [phone]   Fax:  [phone]  Name: Christian [name] MRN: [id number] Date of Birth: 10-29-1941

## 2016-10-14 ENCOUNTER — Encounter: Payer: Self-pay | Admitting: Physical Therapy

## 2016-10-14 ENCOUNTER — Ambulatory Visit: Payer: Medicare Other | Admitting: Physical Therapy

## 2016-10-14 DIAGNOSIS — M25511 Pain in right shoulder: Secondary | ICD-10-CM

## 2016-10-14 DIAGNOSIS — R2681 Unsteadiness on feet: Secondary | ICD-10-CM

## 2016-10-14 DIAGNOSIS — G8929 Other chronic pain: Secondary | ICD-10-CM | POA: Diagnosis not present

## 2016-10-14 DIAGNOSIS — M25512 Pain in left shoulder: Secondary | ICD-10-CM | POA: Diagnosis not present

## 2016-10-14 DIAGNOSIS — M6281 Muscle weakness (generalized): Secondary | ICD-10-CM

## 2016-10-14 NOTE — Therapy (Signed)
[facility] [place], [place], [address] Phone: [phone]   Fax:  [phone]  Physical Therapy Treatment  Patient Details  Name: Christian [name] MRN: [id number] Date of Birth: [birth year] Referring Provider: Chana Bode, MD  Encounter Date: 10/14/2016      PT End of Session - 10/14/16 1300    Visit Number 5   Number of Visits 16   Date for PT Re-Evaluation 11/25/16   PT Start Time 1228   PT Stop Time 1313   PT Time Calculation (min) 45 min   Activity Tolerance Patient tolerated treatment well   Behavior During Therapy [facility] for tasks assessed/performed      Past Medical History:  Diagnosis Date  . Agitation   . Cataract   . Chronic back pain   . Confusion   . Diabetes ([place])    type 2  . DVT (deep venous thrombosis) ([place]) 01/2015  . Hearing impaired   . High cholesterol   . Hypertension   . Insomnia   . Insomnia   . Leg pain, diffuse   . Leg swelling   . Memory loss   . Mild cognitive impairment    sees [facility] Neurology  . Numbness    fingers, feet, toes  . OSA on CPAP   . 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 06/14/2015  . Pulmonary embolism ([place]) 01/2015  . Spinal stenosis    lumbar  . Tremor    on propranolol  . Wears glasses     Past Surgical History:  Procedure Laterality Date  . CATARACT EXTRACTION    . COLONOSCOPY  2014  . TONSILLECTOMY      There were no vitals filed for this visit.      Subjective Assessment - 10/14/16 1229    Subjective Patient reported feeling ok after last treatment, no falls reported   Pertinent History Patient was diagnosed with blood clots in bialteral LEs and in lungs (Saddle clot) in March 2016; DM, OA, stenosis, neuropathy   How long can you stand comfortably? 10 minute   How long can you walk comfortably? .25 mile   Patient Stated Goals Be able to walk safely, decrease shoulder pain.   Currently in  Pain? Yes   Pain Score 5    Pain Location Shoulder   Pain Orientation Right;Left   Pain Descriptors / Indicators Aching   Pain Type Chronic pain   Pain Onset More than a month ago   Pain Frequency Constant   Aggravating Factors  lifting   Pain Relieving Factors rest/meds            [facility] PT Assessment - 10/14/16 0001      AROM   AROM Assessment Site Shoulder   Right/Left Shoulder Right;Left   Right Shoulder Flexion 110 Degrees   Left Shoulder Flexion 110 Degrees     PROM   PROM Assessment Site Shoulder   Right/Left Shoulder Right;Left   Right Shoulder Flexion 125 Degrees   Left Shoulder Flexion 130 Degrees                     [facility] Adult PT Treatment/Exercise - 10/14/16 0001      Knee/Hip Exercises: Aerobic   Nustep level 4 x 15 mins with UEs and LEs     Shoulder Exercises: Supine   Other Supine Exercises cane for flexion 2x10 and chest press x30     Shoulder Exercises: Standing  Protraction Strengthening;Both;20 reps;Theraband   Theraband Level (Shoulder Protraction) Level 1 (Yellow)   External Rotation Strengthening;Both;20 reps;Theraband   Theraband Level (Shoulder External Rotation) Level 1 (Yellow)   Internal Rotation Strengthening;Both;20 reps;Theraband   Theraband Level (Shoulder Internal Rotation) Level 1 (Yellow)   Row Strengthening;Both;20 reps;Theraband   Theraband Level (Shoulder Row) Level 1 (Yellow)     Shoulder Exercises: Pulleys   Flexion Other (comment)  19min             Balance Exercises - 10/14/16 1304      Balance Exercises: Standing   Standing Eyes Opened Wide (BOA);Foam/compliant surface;Time   Standing Eyes Closed --  54min   Rockerboard Anterior/posterior  45min   Step Ups 6 inch;Forward;UE support 1  2x10           PT Education - 10/14/16 1309    Education provided Yes   Education Details HEP   Person(s) Educated Patient   Methods Explanation;Demonstration;Handout   Comprehension Verbalized  understanding;Returned demonstration          PT Short Term Goals - 10/14/16 1246      PT SHORT TERM GOAL #1   Title Ind with an initial HEP.   Time 2   Period Weeks   Status On-going           PT Long Term Goals - 10/14/16 1247      PT LONG TERM GOAL #1   Title Ind with an advanced HEP.   Time 8   Period Weeks   Status On-going     PT LONG TERM GOAL #2   Title Patient will be able to complete Berg balance test with a score of at least 50 to demonstrate reduced overall fall risk    Time 8   Period Weeks   Status On-going     PT LONG TERM GOAL #3   Title 5/5 bilateral LE strength grade to improve ease of perfoming functional movements and activites.   Period Weeks   Status On-going     PT LONG TERM GOAL #4   Title Pt to demo improved B shoulder flexion to 140 deg in standing    Period Weeks   Status On-going  AROM 110 bil 10/14/16     PT LONG TERM GOAL #5   Title Patient to report decreased shoulder pain to 2/10 or less with ADLS   Time 8   Period Weeks   Status On-going  up to 9/10 with ADL's 10/14/16               Plan - 10/14/16 1305    Clinical Impression Statement Patient progressing overall with no falls reported and progressing with balance activities. Patient has little reported improvement with shoulders today and still unable to perform ADL's due to pain increase up to 9/10. Today HEP given for Shoulder exercises and focus on ROM and strengthening for bil UE. Patient current gola ongoing.   Rehab Potential Good   PT Frequency 2x / week   PT Duration 6 weeks   PT Treatment/Interventions ADLs/Self Care Home Management;Therapeutic exercise;Therapeutic activities;Gait training;Balance training;Neuromuscular re-education;Patient/family education;Electrical Stimulation;Cryotherapy;Moist Heat;Manual techniques;Passive range of motion;Dry needling   PT Next Visit Plan cont with [id number] shoulder therex and balance as tolerated   Consulted and  Agree with Plan of Care Patient      Patient will benefit from skilled therapeutic intervention in order to improve the following deficits and impairments:  Decreased range of motion, Difficulty walking, Pain, Decreased activity tolerance, Decreased  balance, Decreased strength, Postural dysfunction  Visit Diagnosis: Unsteadiness  Chronic left shoulder pain  Chronic right shoulder pain  Muscle weakness (generalized)     Problem List Patient Active Problem List   Diagnosis Date Noted  . Left wrist pain 06/26/2016  . Morning joint stiffness 06/26/2016  . Polyarthralgia 06/26/2016  . Type 2 diabetes mellitus with complication, without long-term current use of insulin ([place]) 04/02/2016  . Gait disturbance 04/02/2016  . Physical deconditioning 04/02/2016  . Pain of both shoulder joints 04/02/2016  . Leg weakness, bilateral 04/02/2016  . Encounter for therapeutic drug monitoring 11/29/2015  . Encounter for health maintenance examination in adult 10/30/2015  . Shoulder pain, bilateral 10/30/2015  . Muscle weakness 10/30/2015  . Generalized weakness 10/30/2015  . Skin lesion 10/30/2015  . PVC (premature ventricular contraction) 10/10/2015  . Diabetes type 2, controlled ([place]) 06/20/2015  . Decreased energy 06/20/2015  . Pulmonary embolism ([place]) 06/20/2015  . DVT (deep venous thrombosis) ([place]) 06/20/2015  . Hammer toe of left foot 06/20/2015  . Pre-ulcerative calluses 06/20/2015  . Hypertrophic toenail 06/20/2015  . Diabetic mononeuropathy associated with diabetes mellitus due to underlying condition ([place]) 06/20/2015  . Leg pain, bilateral 06/20/2015  . Chronic low back pain 06/20/2015  . 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 06/14/2015  . Diabetes type 2, uncontrolled ([place]) 03/06/2015  . OSA on CPAP 03/06/2015  . Edema 03/06/2015  . Leg pain, diffuse 03/06/2015  . Bilateral low back pain without sciatica 03/06/2015  . Tremor  03/06/2015  . Insomnia 03/06/2015  . Vaccine counseling 03/06/2015  . Chronic back pain 03/06/2015  . Wears glasses 03/06/2015  . Hearing loss 03/06/2015  . Spinal stenosis of lumbar region 03/06/2015  . Ptosis 03/06/2015  . Pulmonary emboli ([place]) 03/06/2015  . Positive depression screening 03/06/2015  . Advance directive discussed with patient 03/06/2015  . Bradycardia 02/27/2015  . Long-term (current) use of anticoagulants   . Dementia   . Essential hypertension 02/08/2015  . Hyperlipidemia 02/08/2015  . SOB (shortness of breath) 02/08/2015  . OSA (obstructive sleep apnea) 02/08/2015  . Mild cognitive impairment 02/08/2015  . PE (pulmonary embolism) 01/31/2015    Marikay Roads P, PTA 10/14/2016, 1:14 PM  [facility] [place], [place], [address] Phone: [phone]   Fax:  [phone]  Name: Christian [name] MRN: [id number] Date of Birth: 01/05/1941

## 2016-10-14 NOTE — Patient Instructions (Signed)
ROM: External / Internal Rotation - Wand  ROM: Flexion - Wand (Supine)   Lie on back holding wand. Raise arms over head.  Repeat __10__ times per set. Do _2-3___ sets per session. Do _2___ sessions per day.   Strengthening: Resisted Flexion   Hold tubing with left arm at side. Pull forward and up. Move shoulder through pain-free range of motion. Repeat __10__ times per set. Do _2-3___ sets per session. Do _2-3___ sessions per day.  Strengthening: Resisted Extension   Hold tubing in right hand, arm forward. Pull arm back, elbow straight. Repeat __10__ times per set. Do _2-3___ sets per session. Do _2-3___ sessions per day.   Strengthening: Resisted Internal Rotation   Hold tubing in left hand, elbow at side and forearm out. Rotate forearm in across body. Repeat __10__ times per set. Do _2-3___ sets per session. Do _2-3___ sessions per day.   Strengthening: Resisted External Rotation   Hold tubing in right hand, elbow at side and forearm across body. Rotate forearm out. Repeat __10__ times per set. Do __2-3__ sets per session. Do ____ sessions per day.

## 2016-10-16 ENCOUNTER — Encounter: Payer: Medicare Other | Admitting: Physical Therapy

## 2016-10-21 ENCOUNTER — Encounter: Payer: Self-pay | Admitting: Physical Therapy

## 2016-10-21 ENCOUNTER — Ambulatory Visit: Payer: Medicare Other | Admitting: Physical Therapy

## 2016-10-21 DIAGNOSIS — R2681 Unsteadiness on feet: Secondary | ICD-10-CM

## 2016-10-21 DIAGNOSIS — M25511 Pain in right shoulder: Secondary | ICD-10-CM

## 2016-10-21 DIAGNOSIS — M6281 Muscle weakness (generalized): Secondary | ICD-10-CM

## 2016-10-21 DIAGNOSIS — G8929 Other chronic pain: Secondary | ICD-10-CM

## 2016-10-21 DIAGNOSIS — M25512 Pain in left shoulder: Secondary | ICD-10-CM

## 2016-10-21 NOTE — Therapy (Unsigned)
[name], [place], [address] Phone: [phone]   Fax:  [phone]  Physical Therapy Treatment  Patient Details  Name: Christian [name] MRN: [id number] Date of Birth: 1940-12-07 Referring Provider: Chana Bode, MD  Encounter Date: 10/21/2016      PT End of Session - 10/21/16 1304    Visit Number 6   Number of Visits 16   Date for PT Re-Evaluation 11/25/16   PT Start Time 1228   PT Stop Time 1312   PT Time Calculation (min) 44 min   Activity Tolerance Patient tolerated treatment well   Behavior During Therapy [facility] for tasks assessed/performed      Past Medical History:  Diagnosis Date  . Agitation   . Cataract   . Chronic back pain   . Confusion   . Diabetes ([place])    type 2  . DVT (deep venous thrombosis) ([place]) 01/2015  . Hearing impaired   . High cholesterol   . Hypertension   . Insomnia   . Insomnia   . Leg pain, diffuse   . Leg swelling   . Memory loss   . Mild cognitive impairment    sees [facility] Neurology  . Numbness    fingers, feet, toes  . OSA on CPAP   . 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 06/14/2015  . Pulmonary embolism ([place]) 01/2015  . Spinal stenosis    lumbar  . Tremor    on propranolol  . Wears glasses     Past Surgical History:  Procedure Laterality Date  . CATARACT EXTRACTION    . COLONOSCOPY  2014  . TONSILLECTOMY      There were no vitals filed for this visit.      Subjective Assessment - 10/21/16 1229    Subjective No new complaints today, feeling good per patient and no falls reported   Pertinent History Patient was diagnosed with blood clots in bialteral LEs and in lungs (Saddle clot) in March 2016; DM, OA, stenosis, neuropathy   How long can you stand comfortably? 10 minute   How long can you walk comfortably? .25 mile   Patient Stated Goals Be able to walk safely, decrease shoulder pain.   Currently  in Pain? Yes   Pain Score 5    Pain Location Shoulder   Pain Orientation Right;Left   Pain Descriptors / Indicators Aching   Pain Type Chronic pain   Pain Onset More than a month ago   Pain Frequency Constant   Aggravating Factors  lifting   Pain Relieving Factors rest            [facility] PT Assessment - 10/21/16 0001      Berg Balance Test   Sit to Stand Able to stand without using hands and stabilize independently   Standing Unsupported Able to stand safely 2 minutes   Sitting with Back Unsupported but Feet Supported on Floor or Stool Able to sit safely and securely 2 minutes   Stand to Sit Sits safely with minimal use of hands   Standing Unsupported with Eyes Closed Able to stand 10 seconds safely   From Standing Position, Turn to Look Behind Over each Shoulder Looks behind from both sides and weight shifts well   Turn 360 Degrees Able to turn 360 degrees safely one side only in 4 seconds or less   Standing Unsupported, Alternately Place Feet on Step/Stool Able to complete >2 steps/needs minimal assist  Standing on One Leg Tries to lift leg/unable to hold 3 seconds but remains standing independently                     [facility] Adult PT Treatment/Exercise - 10/21/16 0001      Knee/Hip Exercises: Aerobic   Nustep level 4 x 15 mins with UEs and LEs     Shoulder Exercises: Standing   Protraction Strengthening;Both;20 reps;Theraband   Theraband Level (Shoulder Protraction) Level 1 (Yellow)   External Rotation Strengthening;Both;20 reps;Theraband   Theraband Level (Shoulder External Rotation) Level 1 (Yellow)   Internal Rotation Strengthening;Both;20 reps;Theraband   Theraband Level (Shoulder Internal Rotation) Level 1 (Yellow)   Row Strengthening;Both;20 reps;Theraband   Theraband Level (Shoulder Row) Level 1 (Yellow)     Shoulder Exercises: Pulleys   Flexion Other (comment)  79mn             Balance Exercises - 10/21/16 1302      Balance Exercises:  Standing   Standing Eyes Opened Wide (BOA);Foam/compliant surface;Time  388m   Rockerboard Anterior/posterior  41m69m  Step Ups 6 inch;Forward;UE support 1  2x10   Sit to Stand Time x10             PT Short Term Goals - 10/21/16 1253      PT SHORT TERM GOAL #1   Title Ind with an initial HEP.   Time 2   Status Achieved     PT SHORT TERM GOAL #2   Title --   Time --   Period --           PT Long Term Goals - 10/21/16 1255      PT LONG TERM GOAL #1   Title Ind with an advanced HEP.   Time 8   Period Weeks   Status On-going     PT LONG TERM GOAL #2   Title Patient will be able to complete Berg balance test with a score of at least 50 to demonstrate reduced overall fall risk    Time 8   Period Weeks   Status On-going     PT LONG TERM GOAL #3   Title 5/5 bilateral LE strength grade to improve ease of perfoming functional movements and activites.   Time 8   Period Weeks   Status On-going     PT LONG TERM GOAL #4   Title Pt to demo improved B shoulder flexion to 140 deg in standing    Time 8   Period Weeks   Status On-going  AROM bil 110 degrees 10/21/16     PT LONG TERM GOAL #5   Title Patient to report decreased shoulder pain to 2/10 or less with ADLS   Time 8   Period Weeks               Plan - 10/21/16 1306    Clinical Impression Statement Patient progressing with good tolerance to treatment. Patient has reported no falls and balance BERG score improved by 1 poiont today to 29. Patient has no improvement on ROM for bil shoulder flexion. Patient met STG #1 and LTG's ongoing due to ROM, pain and balance deficts   Rehab Potential Good   PT Frequency 2x / week   PT Duration 6 weeks   PT Treatment/Interventions ADLs/Self Care Home Management;Therapeutic exercise;Therapeutic activities;Gait training;Balance training;Neuromuscular re-education;Patient/family education;Electrical Stimulation;Cryotherapy;Moist Heat;Manual techniques;Passive range of  motion;Dry needling   PT Next Visit Plan cont with [id number] therex and balance  as tolerated   Consulted and Agree with Plan of Care Patient      Patient will benefit from skilled therapeutic intervention in order to improve the following deficits and impairments:  Decreased range of motion, Difficulty walking, Pain, Decreased activity tolerance, Decreased balance, Decreased strength, Postural dysfunction  Visit Diagnosis: Unsteadiness  Chronic left shoulder pain  Chronic right shoulder pain  Muscle weakness (generalized)     Problem List Patient Active Problem List   Diagnosis Date Noted  . Left wrist pain 06/26/2016  . Morning joint stiffness 06/26/2016  . Polyarthralgia 06/26/2016  . Type 2 diabetes mellitus with complication, without long-term current use of insulin ([place]) 04/02/2016  . Gait disturbance 04/02/2016  . Physical deconditioning 04/02/2016  . Pain of both shoulder joints 04/02/2016  . Leg weakness, bilateral 04/02/2016  . Encounter for therapeutic drug monitoring 11/29/2015  . Encounter for health maintenance examination in adult 10/30/2015  . Shoulder pain, bilateral 10/30/2015  . Muscle weakness 10/30/2015  . Generalized weakness 10/30/2015  . Skin lesion 10/30/2015  . PVC (premature ventricular contraction) 10/10/2015  . Diabetes type 2, controlled ([place]) 06/20/2015  . Decreased energy 06/20/2015  . Pulmonary embolism ([place]) 06/20/2015  . DVT (deep venous thrombosis) ([place]) 06/20/2015  . Hammer toe of left foot 06/20/2015  . Pre-ulcerative calluses 06/20/2015  . Hypertrophic toenail 06/20/2015  . Diabetic mononeuropathy associated with diabetes mellitus due to underlying condition ([place]) 06/20/2015  . Leg pain, bilateral 06/20/2015  . Chronic low back pain 06/20/2015  . 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 06/14/2015  . Diabetes type 2, uncontrolled ([place]) 03/06/2015  . OSA on CPAP 03/06/2015   . Edema 03/06/2015  . Leg pain, diffuse 03/06/2015  . Bilateral low back pain without sciatica 03/06/2015  . Tremor 03/06/2015  . Insomnia 03/06/2015  . Vaccine counseling 03/06/2015  . Chronic back pain 03/06/2015  . Wears glasses 03/06/2015  . Hearing loss 03/06/2015  . Spinal stenosis of lumbar region 03/06/2015  . Ptosis 03/06/2015  . Pulmonary emboli ([place]) 03/06/2015  . Positive depression screening 03/06/2015  . Advance directive discussed with patient 03/06/2015  . Bradycardia 02/27/2015  . Long-term (current) use of anticoagulants   . Dementia   . Essential hypertension 02/08/2015  . Hyperlipidemia 02/08/2015  . SOB (shortness of breath) 02/08/2015  . OSA (obstructive sleep apnea) 02/08/2015  . Mild cognitive impairment 02/08/2015  . PE (pulmonary embolism) 01/31/2015    Sameerah Nachtigal P, PTA 10/21/2016, 1:12 PM  Ladean Raya, PTA 10/21/16 1:12 PM  [facility] [address] [place], [place], [address] Phone: [phone]   Fax:  [phone]  Name: VASHAWN EKSTEIN MRN: [id number] Date of Birth: Jun 13, 1941

## 2016-10-24 ENCOUNTER — Ambulatory Visit: Payer: Medicare Other | Admitting: *Deleted

## 2016-10-24 DIAGNOSIS — M25512 Pain in left shoulder: Secondary | ICD-10-CM

## 2016-10-24 DIAGNOSIS — R2681 Unsteadiness on feet: Secondary | ICD-10-CM

## 2016-10-24 DIAGNOSIS — G8929 Other chronic pain: Secondary | ICD-10-CM

## 2016-10-24 DIAGNOSIS — M25511 Pain in right shoulder: Secondary | ICD-10-CM | POA: Diagnosis not present

## 2016-10-24 DIAGNOSIS — M6281 Muscle weakness (generalized): Secondary | ICD-10-CM

## 2016-10-24 NOTE — Therapy (Signed)
[facility] [place], [place], [address] Phone: [phone]   Fax:  [phone]  Physical Therapy Treatment  Patient Details  Name: Christian [name] MRN: [id number] Date of Birth: Aug 24, 1941 Referring Provider: Chana Bode, MD  Encounter Date: 10/24/2016      PT End of Session - 10/24/16 1446    Visit Number 7   Number of Visits 16   Date for PT Re-Evaluation 11/25/16   PT Start Time [id number]   PT Stop Time 1435   PT Time Calculation (min) 50 min      Past Medical History:  Diagnosis Date  . Agitation   . Cataract   . Chronic back pain   . Confusion   . Diabetes ([place])    type 2  . DVT (deep venous thrombosis) ([place]) 01/2015  . Hearing impaired   . High cholesterol   . Hypertension   . Insomnia   . Insomnia   . Leg pain, diffuse   . Leg swelling   . Memory loss   . Mild cognitive impairment    sees [facility]  . Numbness    fingers, feet, toes  . OSA on CPAP   . 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 06/14/2015  . Pulmonary embolism ([place]) 01/2015  . Spinal stenosis    lumbar  . Tremor    on propranolol  . Wears glasses     Past Surgical History:  Procedure Laterality Date  . CATARACT EXTRACTION    . COLONOSCOPY  2014  . TONSILLECTOMY      There were no vitals filed for this visit.      Subjective Assessment - 10/24/16 1351    Subjective Not doing well today. No energy and my neck and both shldrs hurt.   Pertinent History Patient was diagnosed with blood clots in bialteral LEs and in lungs (Saddle clot) in March 2016; DM, OA, stenosis, neuropathy   How long can you stand comfortably? 10 minute   How long can you walk comfortably? .25 mile   Patient Stated Goals Be able to walk safely, decrease shoulder pain.   Currently in Pain? Yes   Pain Score 6    Pain Location Shoulder   Pain Orientation Right;Left   Pain Descriptors / Indicators  Aching   Pain Type Chronic pain   Pain Onset More than a month ago   Pain Frequency Constant                         [facility] Adult PT Treatment/Exercise - 10/24/16 0001      Knee/Hip Exercises: Aerobic   Nustep level 4 x 15 mins with UEs and LEs     Shoulder Exercises: Standing   Protraction Strengthening;Both;20 reps;Theraband   Theraband Level (Shoulder Protraction) Level 1 (Yellow)   External Rotation Strengthening;Both;20 reps;Theraband   Theraband Level (Shoulder External Rotation) Level 1 (Yellow)   Internal Rotation Strengthening;Both;20 reps;Theraband   Theraband Level (Shoulder Internal Rotation) Level 1 (Yellow)   Row Strengthening;Both;20 reps;Theraband   Theraband Level (Shoulder Row) Level 1 (Yellow)     Shoulder Exercises: Pulleys   Flexion Other (comment)  5 min             Balance Exercises - 10/24/16 1408      Balance Exercises: Standing   Standing Eyes Opened Wide (BOA);Foam/compliant surface;Time   Rockerboard Anterior/posterior  35min   Step Ups 6 inch;Forward;UE support  1  2x10   Sit to Stand Time x10             PT Short Term Goals - 10/21/16 1253      PT SHORT TERM GOAL #1   Title Ind with an initial HEP.   Time 2   Status Achieved     PT SHORT TERM GOAL #2   Title --   Time --   Period --           PT Long Term Goals - 10/21/16 1255      PT LONG TERM GOAL #1   Title Ind with an advanced HEP.   Time 8   Period Weeks   Status On-going     PT LONG TERM GOAL #2   Title Patient will be able to complete Berg balance test with a score of at least 50 to demonstrate reduced overall fall risk    Time 8   Period Weeks   Status On-going     PT LONG TERM GOAL #3   Title 5/5 bilateral LE strength grade to improve ease of perfoming functional movements and activites.   Time 8   Period Weeks   Status On-going     PT LONG TERM GOAL #4   Title Pt to demo improved B shoulder flexion to 140 deg in standing     Time 8   Period Weeks   Status On-going  AROM bil 110 degrees 10/21/16     PT LONG TERM GOAL #5   Title Patient to report decreased shoulder pain to 2/10 or less with ADLS   Time 8   Period Weeks               Plan - 10/24/16 1447    Clinical Impression Statement Pt not feeling good today due to lack of energy and pain levels, but he was able to complete all LE and UE activities as well as balance. He was challenged most by rockerboard balane control and did well with toe taps today with no LOB. SBA and CGA with all balance exs.[name] LTGs are ongoing due to balance deficits and pain    Rehab Potential Good   PT Frequency 2x / week   PT Duration 6 weeks   PT Treatment/Interventions ADLs/Self Care Home Management;Therapeutic exercise;Therapeutic activities;Gait training;Balance training;Neuromuscular re-education;Patient/family education;Electrical Stimulation;Cryotherapy;Moist Heat;Manual techniques;Passive range of motion;Dry needling   PT Next Visit Plan cont with [id number] shoulder therex and balance as tolerated   PT Home Exercise Plan HEP next treatment   Consulted and Agree with Plan of Care Patient      Patient will benefit from skilled therapeutic intervention in order to improve the following deficits and impairments:  Decreased range of motion, Difficulty walking, Pain, Decreased activity tolerance, Decreased balance, Decreased strength, Postural dysfunction  Visit Diagnosis: Unsteadiness  Chronic left shoulder pain  Chronic right shoulder pain  Muscle weakness (generalized)     Problem List Patient Active Problem List   Diagnosis Date Noted  . Left wrist pain 06/26/2016  . Morning joint stiffness 06/26/2016  . Polyarthralgia 06/26/2016  . Type 2 diabetes mellitus with complication, without long-term current use of insulin ([place]) 04/02/2016  . Gait disturbance 04/02/2016  . Physical deconditioning 04/02/2016  . Pain of both shoulder joints 04/02/2016  .  Leg weakness, bilateral 04/02/2016  . Encounter for therapeutic drug monitoring 11/29/2015  . Encounter for health maintenance examination in adult 10/30/2015  . Shoulder pain, bilateral 10/30/2015  . Muscle weakness  10/30/2015  . Generalized weakness 10/30/2015  . Skin lesion 10/30/2015  . PVC (premature ventricular contraction) 10/10/2015  . Diabetes type 2, controlled ([place]) 06/20/2015  . Decreased energy 06/20/2015  . Pulmonary embolism ([place]) 06/20/2015  . DVT (deep venous thrombosis) ([place]) 06/20/2015  . Hammer toe of left foot 06/20/2015  . Pre-ulcerative calluses 06/20/2015  . Hypertrophic toenail 06/20/2015  . Diabetic mononeuropathy associated with diabetes mellitus due to underlying condition ([place]) 06/20/2015  . Leg pain, bilateral 06/20/2015  . Chronic low back pain 06/20/2015  . 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 06/14/2015  . Diabetes type 2, uncontrolled ([place]) 03/06/2015  . OSA on CPAP 03/06/2015  . Edema 03/06/2015  . Leg pain, diffuse 03/06/2015  . Bilateral low back pain without sciatica 03/06/2015  . Tremor 03/06/2015  . Insomnia 03/06/2015  . Vaccine counseling 03/06/2015  . Chronic back pain 03/06/2015  . Wears glasses 03/06/2015  . Hearing loss 03/06/2015  . Spinal stenosis of lumbar region 03/06/2015  . Ptosis 03/06/2015  . Pulmonary emboli ([place]) 03/06/2015  . Positive depression screening 03/06/2015  . Advance directive discussed with patient 03/06/2015  . Bradycardia 02/27/2015  . Long-term (current) use of anticoagulants   . Dementia   . Essential hypertension 02/08/2015  . Hyperlipidemia 02/08/2015  . SOB (shortness of breath) 02/08/2015  . OSA (obstructive sleep apnea) 02/08/2015  . Mild cognitive impairment 02/08/2015  . PE (pulmonary embolism) 01/31/2015    Tayelor Osborne,CHRIS, PTA 10/24/2016, 2:53 PM  [facility] [address] [place], [place],  [address] Phone: [phone]   Fax:  [phone]  Name: Christian [name] MRN: [id number] Date of Birth: 1940-12-08

## 2016-10-27 ENCOUNTER — Emergency Department (HOSPITAL_COMMUNITY): Payer: Medicare Other

## 2016-10-27 ENCOUNTER — Encounter (HOSPITAL_COMMUNITY): Payer: Self-pay | Admitting: Emergency Medicine

## 2016-10-27 ENCOUNTER — Emergency Department (HOSPITAL_COMMUNITY)
Admission: EM | Admit: 2016-10-27 | Discharge: 2016-10-27 | Disposition: A | Discharge status: Home / Self Care | Payer: Medicare Other | Attending: Emergency Medicine | Admitting: Emergency Medicine

## 2016-10-27 DIAGNOSIS — Z87891 Personal history of nicotine dependence: Secondary | ICD-10-CM | POA: Diagnosis not present

## 2016-10-27 DIAGNOSIS — R930 Abnormal findings on diagnostic imaging of skull and head, not elsewhere classified: Secondary | ICD-10-CM | POA: Diagnosis not present

## 2016-10-27 DIAGNOSIS — Z7901 Long term (current) use of anticoagulants: Secondary | ICD-10-CM | POA: Insufficient documentation

## 2016-10-27 DIAGNOSIS — I10 Essential (primary) hypertension: Secondary | ICD-10-CM | POA: Insufficient documentation

## 2016-10-27 DIAGNOSIS — S80211A Abrasion, right knee, initial encounter: Secondary | ICD-10-CM | POA: Diagnosis not present

## 2016-10-27 DIAGNOSIS — Y939 Activity, unspecified: Secondary | ICD-10-CM | POA: Insufficient documentation

## 2016-10-27 DIAGNOSIS — Z79899 Other long term (current) drug therapy: Secondary | ICD-10-CM | POA: Insufficient documentation

## 2016-10-27 DIAGNOSIS — R350 Frequency of micturition: Secondary | ICD-10-CM | POA: Diagnosis not present

## 2016-10-27 DIAGNOSIS — W19XXXA Unspecified fall, initial encounter: Secondary | ICD-10-CM | POA: Insufficient documentation

## 2016-10-27 DIAGNOSIS — E119 Type 2 diabetes mellitus without complications: Secondary | ICD-10-CM | POA: Insufficient documentation

## 2016-10-27 DIAGNOSIS — S80212A Abrasion, left knee, initial encounter: Secondary | ICD-10-CM | POA: Diagnosis not present

## 2016-10-27 DIAGNOSIS — Z7984 Long term (current) use of oral hypoglycemic drugs: Secondary | ICD-10-CM | POA: Diagnosis not present

## 2016-10-27 DIAGNOSIS — R531 Weakness: Secondary | ICD-10-CM | POA: Diagnosis not present

## 2016-10-27 DIAGNOSIS — S8991XA Unspecified injury of right lower leg, initial encounter: Secondary | ICD-10-CM | POA: Diagnosis present

## 2016-10-27 DIAGNOSIS — R404 Transient alteration of awareness: Secondary | ICD-10-CM | POA: Diagnosis not present

## 2016-10-27 DIAGNOSIS — Y929 Unspecified place or not applicable: Secondary | ICD-10-CM | POA: Insufficient documentation

## 2016-10-27 DIAGNOSIS — Y999 Unspecified external cause status: Secondary | ICD-10-CM | POA: Diagnosis not present

## 2016-10-27 DIAGNOSIS — N390 Urinary tract infection, site not specified: Secondary | ICD-10-CM | POA: Diagnosis not present

## 2016-10-27 HISTORY — DX: Unspecified osteoarthritis, unspecified site: M19.90

## 2016-10-27 LAB — COMPREHENSIVE METABOLIC PANEL
ALK PHOS: 74 U/L (ref 38–126)
ALT: 18 U/L (ref 17–63)
AST: 30 U/L (ref 15–41)
Albumin: 4.1 g/dL (ref 3.5–5.0)
Anion gap: 10 (ref 5–15)
BUN: 16 mg/dL (ref 6–20)
CALCIUM: 9 mg/dL (ref 8.9–10.3)
CO2: 22 mmol/L (ref 22–32)
CREATININE: 0.87 mg/dL (ref 0.61–1.24)
Chloride: 105 mmol/L (ref 101–111)
Glucose, Bld: 225 mg/dL — ABNORMAL HIGH (ref 65–99)
Potassium: 3.8 mmol/L (ref 3.5–5.1)
Sodium: 137 mmol/L (ref 135–145)
Total Bilirubin: 0.6 mg/dL (ref 0.3–1.2)
Total Protein: 7 g/dL (ref 6.5–8.1)

## 2016-10-27 LAB — CBC WITH DIFFERENTIAL/PLATELET
BASOS ABS: 0.1 10*3/uL (ref 0.0–0.1)
BASOS PCT: 1 %
Eosinophils Absolute: 0 10*3/uL (ref 0.0–0.7)
Eosinophils Relative: 0 %
HEMATOCRIT: 43.8 % (ref 39.0–52.0)
Hemoglobin: 14.4 g/dL (ref 13.0–17.0)
Lymphocytes Relative: 9 %
Lymphs Abs: 0.5 10*3/uL — ABNORMAL LOW (ref 0.7–4.0)
MCH: 29.8 pg (ref 26.0–34.0)
MCHC: 32.9 g/dL (ref 30.0–36.0)
MCV: 90.5 fL (ref 78.0–100.0)
MONO ABS: 0.5 10*3/uL (ref 0.1–1.0)
Monocytes Relative: 8 %
NEUTROS ABS: 5.2 10*3/uL (ref 1.7–7.7)
NEUTROS PCT: 82 %
Platelets: 204 10*3/uL (ref 150–400)
RBC: 4.84 MIL/uL (ref 4.22–5.81)
RDW: 12.7 % (ref 11.5–15.5)
WBC: 6.2 10*3/uL (ref 4.0–10.5)

## 2016-10-27 LAB — URINE MICROSCOPIC-ADD ON
Bacteria, UA: NONE SEEN
RBC / HPF: NONE SEEN RBC/hpf (ref 0–5)
SQUAMOUS EPITHELIAL / LPF: NONE SEEN

## 2016-10-27 LAB — URINALYSIS, ROUTINE W REFLEX MICROSCOPIC
Bilirubin Urine: NEGATIVE
Hgb urine dipstick: NEGATIVE
KETONES UR: 40 mg/dL — AB
LEUKOCYTES UA: NEGATIVE
NITRITE: NEGATIVE
PROTEIN: NEGATIVE mg/dL
Specific Gravity, Urine: 1.044 — ABNORMAL HIGH (ref 1.005–1.030)
pH: 6 (ref 5.0–8.0)

## 2016-10-27 MED ORDER — SODIUM CHLORIDE 0.9 % IV BOLUS (SEPSIS)
1000.0000 mL | Freq: Once | INTRAVENOUS | Status: AC
  Administered 2016-10-27: 1000 mL via INTRAVENOUS

## 2016-10-27 NOTE — ED Notes (Signed)
Family called. Will send someone to pick pt up.

## 2016-10-27 NOTE — ED Notes (Signed)
Pt ambulated to restroom independently.

## 2016-10-27 NOTE — Discharge Instructions (Signed)
Follow up with your family doc in the next couple of days.  They may want to check an A1C.

## 2016-10-27 NOTE — ED Notes (Signed)
Patient transported to X-ray 

## 2016-10-27 NOTE — ED Notes (Signed)
Bed: [id number] Expected date:  Expected time:  Means of arrival:  Comments: 75 yo UTI?

## 2016-10-27 NOTE — ED Notes (Signed)
Christian [name], Pt's wife called for an update. She can be called [phone]

## 2016-10-27 NOTE — ED Triage Notes (Signed)
Pt from home. Reports urinary frequency and generalized weakness for the past 2 days. No dysuria. Wife told EMS pt has early dementia. Alert and oriented 4x. Shaky when standing. Urine clear, but smells strong

## 2016-10-27 NOTE — ED Provider Notes (Signed)
[facility] DEPT Provider Note   CSN: [id number] Arrival date & time: 10/27/16  1043     History   Chief Complaint Chief Complaint  Patient presents with  . Urinary Frequency  . Weakness    HPI Christian [name] is a 75 y.o. male.  Level 5 caveat dementia. 75 year old male with a chief complaint of increased urinary frequency. Going on for the past couple days. Per EMS is also been generally weak as well. Patient is mildly confused about the symptoms that he has had. He denies chest pain shortness of breath abdominal pain flank pain dysuria. Denies head injury.   The history is provided by the patient and the EMS personnel.  Urinary Frequency  This is a new problem. The current episode started 2 days ago. The problem occurs constantly. The problem has not changed since onset.Pertinent negatives include no chest pain, no abdominal pain, no headaches and no shortness of breath. Nothing aggravates the symptoms. Nothing relieves the symptoms. He has tried nothing for the symptoms. The treatment provided no relief.  Weakness  Pertinent negatives include no shortness of breath, no chest pain, no vomiting, no confusion and no headaches.    Past Medical History:  Diagnosis Date  . Agitation   . Arthritis   . Cataract   . Chronic back pain   . Confusion   . Diabetes ([place])    type 2  . DVT (deep venous thrombosis) ([place]) 01/2015  . Hearing impaired   . High cholesterol   . Hypertension   . Insomnia   . Insomnia   . Leg pain, diffuse   . Leg swelling   . Memory loss   . Mild cognitive impairment    sees [facility]  . Numbness    fingers, feet, toes  . OSA on CPAP   . 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 06/14/2015  . Pulmonary embolism ([place]) 01/2015  . Spinal stenosis    lumbar  . Tremor    on propranolol  . Wears glasses     Patient Active Problem List   Diagnosis Date Noted  . Left wrist pain 06/26/2016  .  Morning joint stiffness 06/26/2016  . Polyarthralgia 06/26/2016  . Type 2 diabetes mellitus with complication, without long-term current use of insulin ([place]) 04/02/2016  . Gait disturbance 04/02/2016  . Physical deconditioning 04/02/2016  . Pain of both shoulder joints 04/02/2016  . Leg weakness, bilateral 04/02/2016  . Encounter for therapeutic drug monitoring 11/29/2015  . Encounter for health maintenance examination in adult 10/30/2015  . Shoulder pain, bilateral 10/30/2015  . Muscle weakness 10/30/2015  . Generalized weakness 10/30/2015  . Skin lesion 10/30/2015  . PVC (premature ventricular contraction) 10/10/2015  . Diabetes type 2, controlled ([place]) 06/20/2015  . Decreased energy 06/20/2015  . Pulmonary embolism ([place]) 06/20/2015  . DVT (deep venous thrombosis) ([place]) 06/20/2015  . Hammer toe of left foot 06/20/2015  . Pre-ulcerative calluses 06/20/2015  . Hypertrophic toenail 06/20/2015  . Diabetic mononeuropathy associated with diabetes mellitus due to underlying condition ([place]) 06/20/2015  . Leg pain, bilateral 06/20/2015  . Chronic low back pain 06/20/2015  . 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 06/14/2015  . Diabetes type 2, uncontrolled ([place]) 03/06/2015  . OSA on CPAP 03/06/2015  . Edema 03/06/2015  . Leg pain, diffuse 03/06/2015  . Bilateral low back pain without sciatica 03/06/2015  . Tremor 03/06/2015  . Insomnia 03/06/2015  .  Vaccine counseling 03/06/2015  . Chronic back pain 03/06/2015  . Wears glasses 03/06/2015  . Hearing loss 03/06/2015  . Spinal stenosis of lumbar region 03/06/2015  . Ptosis 03/06/2015  . Pulmonary emboli ([place]) 03/06/2015  . Positive depression screening 03/06/2015  . Advance directive discussed with patient 03/06/2015  . Bradycardia 02/27/2015  . Long-term (current) use of anticoagulants   . Dementia   . Essential hypertension 02/08/2015  . Hyperlipidemia 02/08/2015  . SOB (shortness  of breath) 02/08/2015  . OSA (obstructive sleep apnea) 02/08/2015  . Mild cognitive impairment 02/08/2015  . PE (pulmonary embolism) 01/31/2015    Past Surgical History:  Procedure Laterality Date  . CATARACT EXTRACTION    . COLONOSCOPY  2014  . TONSILLECTOMY         Home Medications    Prior to Admission medications   Medication Sig Start Date End Date Taking? Authorizing Provider  Canagliflozin-Metformin HCl (INVOKAMET) [id number] MG TABS Take 1 tablet by mouth 2 (two) times daily. 06/26/16 07/27/17  Camelia Eng Tysinger, PA-C  diltiazem (CARDIZEM) 30 MG tablet Take 1 tablet (30 mg total) by mouth 2 (two) times daily. 04/02/16   Evans Lance, MD  donepezil (ARICEPT) 10 MG tablet TAKE 1 TABLET BY MOUTH EVERY NIGHT AT BEDTIME 02/14/16   Garvin Fila, MD  Dulaglutide (TRULICITY) 1.5 0000000 SOPN Inject 1.5 mg into the skin once a week. 05/10/16   Camelia Eng Tysinger, PA-C  EPINEPHrine (EPIPEN 2-PAK) 0.3 mg/0.3 mL IJ SOAJ injection Inject 0.3 mLs (0.3 mg total) into the muscle once. Patient not taking: Reported on 09/30/2016 03/06/15   Camelia Eng Tysinger, PA-C  flecainide (TAMBOCOR) 150 MG tablet TAKE 1/2 TABLET BY MOUTH TWICE DAILY Patient not taking: Reported on 09/30/2016 08/08/16   Evans Lance, MD  flecainide (TAMBOCOR) 150 MG tablet TAKE 1/2 TABLET BY MOUTH TWICE DAILY Patient not taking: Reported on 09/30/2016 09/10/16   Evans Lance, MD  glucose blood (TRUE METRIX BLOOD GLUCOSE TEST) test strip Use as instructed Patient not taking: Reported on 07/15/2016 01/18/16   Camelia Eng Tysinger, PA-C  HYDROcodone-acetaminophen (NORCO/VICODIN) 5-325 MG tablet 1 tablet 1-2 times daily prn pain Patient not taking: Reported on 09/30/2016 06/26/16   Camelia Eng Tysinger, PA-C  iron polysaccharides (NIFEREX) 150 MG capsule Take 1 capsule (150 mg total) by mouth daily. 04/24/16   Baird Cancer, PA-C  Misc Natural Products (OSTEO BI-FLEX ADV JOINT SHIELD PO) Take 1 tablet by mouth daily.     Historical Provider, MD    rivaroxaban (XARELTO) 20 MG TABS tablet Take 1 tablet (20 mg total) by mouth daily with supper. 02/22/16   Evans Lance, MD  tamsulosin (FLOMAX) 0.4 MG CAPS capsule TAKE 1 CAPSULE(0.4 MG) BY MOUTH DAILY 08/27/16   Carlena Hurl, PA-C    Family History Family History  Problem Relation Age of Onset  . Dementia Father   . Clotting disorder Mother   . Heart disease Brother   . Clotting disorder Brother   . Psychiatric Illness Sister   . Leukemia Sister     Social History Social History  Substance Use Topics  . Smoking status: Former Smoker    Packs/day: 1.00    Years: 10.00    Types: Cigarettes    Quit date: 11/25/2004  . Smokeless tobacco: Never Used  . Alcohol use No     Comment: one every two months     Allergies   Bee venom; Ultram [tramadol hcl]; Oxycodone; and Oxycontin [oxycodone hcl]  Review of Systems Review of Systems  Constitutional: Positive for activity change. Negative for chills and fever.  HENT: Negative for congestion and facial swelling.   Eyes: Negative for discharge and visual disturbance.  Respiratory: Negative for shortness of breath.   Cardiovascular: Negative for chest pain and palpitations.  Gastrointestinal: Negative for abdominal pain, diarrhea and vomiting.  Genitourinary: Positive for frequency.  Musculoskeletal: Negative for arthralgias and myalgias.  Skin: Negative for color change and rash.  Neurological: Positive for weakness. Negative for tremors, syncope and headaches.  Psychiatric/Behavioral: Negative for confusion and dysphoric mood.     Physical Exam Updated Vital Signs BP 134/64   Pulse 98   Temp 98.5 F (36.9 C) (Oral)   Resp 16   SpO2 93%   Physical Exam  Constitutional: He is oriented to person, place, and time. He appears well-developed and well-nourished.  HENT:  Head: Normocephalic and atraumatic.  Eyes: EOM are normal. Pupils are equal, round, and reactive to light.  Neck: Normal range of motion. Neck supple.  No JVD present.  Cardiovascular: Normal rate and regular rhythm.  Exam reveals no gallop and no friction rub.   No murmur heard. Pulmonary/Chest: No respiratory distress. He has no wheezes.  Abdominal: He exhibits no distension and no mass. There is no tenderness. There is no rebound and no guarding.  Musculoskeletal: Normal range of motion.  Abrasion to bilateral knees  Neurological: He is alert and oriented to person, place, and time.  Skin: No rash noted. No pallor.  Psychiatric: He has a normal mood and affect. His behavior is normal.  Nursing note and vitals reviewed.    ED Treatments / Results  Labs (all labs ordered are listed, but only abnormal results are displayed) Labs Reviewed  URINALYSIS, ROUTINE W REFLEX MICROSCOPIC (NOT AT [facility]) - Abnormal; Notable for the following:       Result Value   Specific Gravity, Urine 1.044 (*)    Glucose, UA >1000 (*)    Ketones, ur 40 (*)    All other components within normal limits  CBC WITH DIFFERENTIAL/PLATELET - Abnormal; Notable for the following:    Lymphs Abs 0.5 (*)    All other components within normal limits  COMPREHENSIVE METABOLIC PANEL - Abnormal; Notable for the following:    Glucose, Bld 225 (*)    All other components within normal limits  URINE MICROSCOPIC-ADD ON    EKG  EKG Interpretation None       Radiology Dg Chest 2 View  Result Date: 10/27/2016 CLINICAL DATA:  Urinary frequency.  Generalized weakness. EXAM: CHEST  2 VIEW COMPARISON:  12/08/2015. FINDINGS: Low lung volumes accentuate the cardiac silhouette which is probably stable and within normal limits. No active infiltrates or failure. No significant effusion or pneumothorax. Degenerative change both shoulders. Thoracic spondylosis. IMPRESSION: Low lung volumes. No active infiltrates. No significant change from priors. Electronically Signed   By: Staci Righter M.D.   On: 10/27/2016 11:58   Ct Head Wo Contrast  Result Date: 10/27/2016 CLINICAL DATA:   Urinary frequency and generalized weakness. Early dementia. EXAM: CT HEAD WITHOUT CONTRAST TECHNIQUE: Contiguous axial images were obtained from the base of the skull through the vertex without intravenous contrast. COMPARISON:  MR and CT brain 07/11/2015. FINDINGS: Brain: No evidence for acute infarction, hemorrhage, mass lesion, hydrocephalus, or extra-axial fluid. Mild atrophy, not unexpected for age. No significant white matter hypoattenuation. Vascular: No hyperdense vessel. Proximal vascular calcification not unexpected for age. Skull: Normal. Negative for fracture or focal  lesion. Sinuses/Orbits: No acute finding. Other: None. IMPRESSION: Mild atrophy without acute intracranial findings. No significant change from prior neuroimaging. Electronically Signed   By: Staci Righter M.D.   On: 10/27/2016 12:00    Procedures Procedures (including critical care time)  Medications Ordered in ED Medications  sodium chloride 0.9 % bolus 1,000 mL (1,000 mLs Intravenous New Bag/Given 10/27/16 1147)     Initial Impression / Assessment and Plan / ED Course  I have reviewed the triage vital signs and the nursing notes.  Pertinent labs & imaging results that were available during my care of the patient were reviewed by me and considered in my medical decision making (see chart for details).  Clinical Course     75 yo M With a chief complaint of increased urinary frequency. Was doing the exam the patient has had an abrasion on bilateral knees. He stated "Oh yeah I fell yesterday." He was unsure if he struck his head. Will obtain basic labs CT head, cxr.   Evaluation was unremarkable. Patient is given a liter of fluids and feels better. He did have an elevated blood sugar this may be the cause of his increased frequency. Suggested he follow with his PCP in a couple days.  12:50 PM:  I have discussed the diagnosis/risks/treatment options with the patient and believe the pt to be eligible for discharge home  to follow-up with PCP. We also discussed returning to the ED immediately if new or worsening sx occur. We discussed the sx which are most concerning (e.g., sudden worsening pain, fever, inability to tolerate by mouth) that necessitate immediate return. Medications administered to the patient during their visit and any new prescriptions provided to the patient are listed below.  Medications given during this visit Medications  sodium chloride 0.9 % bolus 1,000 mL (1,000 mLs Intravenous New Bag/Given 10/27/16 1147)     The patient appears reasonably screen and/or stabilized for discharge and I doubt any other medical condition or other [facility] requiring further screening, evaluation, or treatment in the ED at this time prior to discharge.    Final Clinical Impressions(s) / ED Diagnoses   Final diagnoses:  Urinary frequency    New Prescriptions New Prescriptions   No medications on file     Deno Etienne, DO 10/27/16 1250

## 2016-10-28 ENCOUNTER — Encounter: Payer: Medicare Other | Admitting: Physical Therapy

## 2016-10-31 ENCOUNTER — Other Ambulatory Visit: Payer: Self-pay | Admitting: Medical

## 2016-10-31 ENCOUNTER — Ambulatory Visit: Payer: Medicare Other | Admitting: Physical Therapy

## 2016-11-07 ENCOUNTER — Ambulatory Visit: Payer: Medicare Other | Attending: Medical | Admitting: *Deleted

## 2016-11-07 DIAGNOSIS — R2681 Unsteadiness on feet: Secondary | ICD-10-CM | POA: Insufficient documentation

## 2016-11-07 DIAGNOSIS — M25511 Pain in right shoulder: Secondary | ICD-10-CM | POA: Diagnosis not present

## 2016-11-07 DIAGNOSIS — G8929 Other chronic pain: Secondary | ICD-10-CM

## 2016-11-07 DIAGNOSIS — M25512 Pain in left shoulder: Secondary | ICD-10-CM | POA: Insufficient documentation

## 2016-11-07 DIAGNOSIS — M6281 Muscle weakness (generalized): Secondary | ICD-10-CM | POA: Diagnosis not present

## 2016-11-07 NOTE — Therapy (Signed)
[facility] [place], [place], [address] Phone: [phone]   Fax:  [phone]  Physical Therapy Treatment  Patient Details  Name: Christian [name] MRN: [id number] Date of Birth: 26-Nov-1940 Referring Provider: Chana Bode, MD  Encounter Date: 11/07/2016      PT End of Session - 11/07/16 1304    Visit Number 8   Number of Visits 16   Date for PT Re-Evaluation 11/25/16   PT Start Time 1300   PT Stop Time [id number]   PT Time Calculation (min) 49 min      Past Medical History:  Diagnosis Date  . Agitation   . Arthritis   . Cataract   . Chronic back pain   . Confusion   . Diabetes ([place])    type 2  . DVT (deep venous thrombosis) ([place]) 01/2015  . Hearing impaired   . High cholesterol   . Hypertension   . Insomnia   . Insomnia   . Leg pain, diffuse   . Leg swelling   . Memory loss   . Mild cognitive impairment    sees [facility] Neurology  . Numbness    fingers, feet, toes  . OSA on CPAP   . 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 06/14/2015  . Pulmonary embolism ([place]) 01/2015  . Spinal stenosis    lumbar  . Tremor    on propranolol  . Wears glasses     Past Surgical History:  Procedure Laterality Date  . CATARACT EXTRACTION    . COLONOSCOPY  2014  . TONSILLECTOMY      There were no vitals filed for this visit.      Subjective Assessment - 11/07/16 1301    Subjective Not doing well today. No energy and my neck and both shldrs hurt.   Pertinent History Patient was diagnosed with blood clots in bialteral LEs and in lungs (Saddle clot) in March 2016; DM, OA, stenosis, neuropathy   How long can you stand comfortably? 10 minute   How long can you walk comfortably? .25 mile   Patient Stated Goals Be able to walk safely, decrease shoulder pain.   Pain Location Shoulder   Pain Orientation Right;Left   Pain Onset More than a month ago                          [facility] Adult PT Treatment/Exercise - 11/07/16 0001      Knee/Hip Exercises: Aerobic   Nustep level 4 x 15 mins with UEs and LEs     Shoulder Exercises: Standing   Protraction Strengthening;Both;20 reps;Theraband   Theraband Level (Shoulder Protraction) Level 1 (Yellow)   External Rotation Strengthening;Both;20 reps;Theraband   Theraband Level (Shoulder External Rotation) Level 1 (Yellow)   Internal Rotation Strengthening;Both;20 reps;Theraband   Theraband Level (Shoulder Internal Rotation) Level 1 (Yellow)   Row Strengthening;Both;20 reps;Theraband   Theraband Level (Shoulder Row) Level 1 (Yellow)     Shoulder Exercises: Pulleys   Flexion Other (comment)  5 min    6 in step ups 3 x10 with 2 hands on bars         Balance Exercises - 11/07/16 1309      Balance Exercises: Standing   Standing Eyes Opened Wide (BOA);Foam/compliant surface   Rockerboard Anterior/posterior  16min   Step Ups 6 inch;Forward;UE support 1  2x10             PT  Short Term Goals - 10/21/16 1253      PT SHORT TERM GOAL #1   Title Ind with an initial HEP.   Time 2   Status Achieved     PT SHORT TERM GOAL #2   Title --   Time --   Period --           PT Long Term Goals - 10/21/16 1255      PT LONG TERM GOAL #1   Title Ind with an advanced HEP.   Time 8   Period Weeks   Status On-going     PT LONG TERM GOAL #2   Title Patient will be able to complete Berg balance test with a score of at least 50 to demonstrate reduced overall fall risk    Time 8   Period Weeks   Status On-going     PT LONG TERM GOAL #3   Title 5/5 bilateral LE strength grade to improve ease of perfoming functional movements and activites.   Time 8   Period Weeks   Status On-going     PT LONG TERM GOAL #4   Title Pt to demo improved B shoulder flexion to 140 deg in standing    Time 8   Period Weeks   Status On-going  AROM bil 110 degrees 10/21/16     PT LONG  TERM GOAL #5   Title Patient to report decreased shoulder pain to 2/10 or less with ADLS   Time 8   Period Weeks               Plan - 11/07/16 1349    Clinical Impression Statement Pt arrives to clinic again today not feeling well. He has low energy levels again and Both shldrs are mainly just sore. He was able to complete all therex for shldrs and balance Act.'s.  The rockerboard control was still a challenge for him and he only had 1 LOB today and that was during 6 inch toe taps.[name] LTGs are ongoing at this time due to balance and  strengths deficits.   Rehab Potential Good   PT Frequency 2x / week   PT Duration 6 weeks   PT Next Visit Plan cont with [id number] shoulder therex and balance as tolerated   PT Home Exercise Plan HEP next treatment   Consulted and Agree with Plan of Care Patient      Patient will benefit from skilled therapeutic intervention in order to improve the following deficits and impairments:  Decreased range of motion, Difficulty walking, Pain, Decreased activity tolerance, Decreased balance, Decreased strength, Postural dysfunction  Visit Diagnosis: Unsteadiness  Chronic left shoulder pain  Chronic right shoulder pain  Muscle weakness (generalized)     Problem List Patient Active Problem List   Diagnosis Date Noted  . Left wrist pain 06/26/2016  . Morning joint stiffness 06/26/2016  . Polyarthralgia 06/26/2016  . Type 2 diabetes mellitus with complication, without long-term current use of insulin ([place]) 04/02/2016  . Gait disturbance 04/02/2016  . Physical deconditioning 04/02/2016  . Pain of both shoulder joints 04/02/2016  . Leg weakness, bilateral 04/02/2016  . Encounter for therapeutic drug monitoring 11/29/2015  . Encounter for health maintenance examination in adult 10/30/2015  . Shoulder pain, bilateral 10/30/2015  . Muscle weakness 10/30/2015  . Generalized weakness 10/30/2015  . Skin lesion 10/30/2015  . PVC (premature  ventricular contraction) 10/10/2015  . Diabetes type 2, controlled ([place]) 06/20/2015  . Decreased energy 06/20/2015  . Pulmonary embolism (  [place]) 06/20/2015  . DVT (deep venous thrombosis) ([place]) 06/20/2015  . Hammer toe of left foot 06/20/2015  . Pre-ulcerative calluses 06/20/2015  . Hypertrophic toenail 06/20/2015  . Diabetic mononeuropathy associated with diabetes mellitus due to underlying condition ([place]) 06/20/2015  . Leg pain, bilateral 06/20/2015  . Chronic low back pain 06/20/2015  . 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 06/14/2015  . Diabetes type 2, uncontrolled ([place]) 03/06/2015  . OSA on CPAP 03/06/2015  . Edema 03/06/2015  . Leg pain, diffuse 03/06/2015  . Bilateral low back pain without sciatica 03/06/2015  . Tremor 03/06/2015  . Insomnia 03/06/2015  . Vaccine counseling 03/06/2015  . Chronic back pain 03/06/2015  . Wears glasses 03/06/2015  . Hearing loss 03/06/2015  . Spinal stenosis of lumbar region 03/06/2015  . Ptosis 03/06/2015  . Pulmonary emboli ([place]) 03/06/2015  . Positive depression screening 03/06/2015  . Advance directive discussed with patient 03/06/2015  . Bradycardia 02/27/2015  . Long-term (current) use of anticoagulants   . Dementia   . Essential hypertension 02/08/2015  . Hyperlipidemia 02/08/2015  . SOB (shortness of breath) 02/08/2015  . OSA (obstructive sleep apnea) 02/08/2015  . Mild cognitive impairment 02/08/2015  . PE (pulmonary embolism) 01/31/2015    Alaney Witter,CHRIS, PTA 11/07/2016, 1:57 PM  [facility] [address] [place], [place], [address] Phone: [phone]   Fax:  [phone]  Name: Christian [name] MRN: [id number] Date of Birth: 04-01-1941

## 2016-11-11 ENCOUNTER — Encounter: Payer: Self-pay | Admitting: Physical Therapy

## 2016-11-11 ENCOUNTER — Ambulatory Visit: Payer: Medicare Other | Admitting: Physical Therapy

## 2016-11-11 DIAGNOSIS — M25512 Pain in left shoulder: Secondary | ICD-10-CM

## 2016-11-11 DIAGNOSIS — G8929 Other chronic pain: Secondary | ICD-10-CM | POA: Diagnosis not present

## 2016-11-11 DIAGNOSIS — M25511 Pain in right shoulder: Secondary | ICD-10-CM | POA: Diagnosis not present

## 2016-11-11 DIAGNOSIS — R2681 Unsteadiness on feet: Secondary | ICD-10-CM | POA: Diagnosis not present

## 2016-11-11 DIAGNOSIS — M6281 Muscle weakness (generalized): Secondary | ICD-10-CM | POA: Diagnosis not present

## 2016-11-11 NOTE — Therapy (Signed)
[facility] [place], [place], [address] Phone: [phone]   Fax:  [phone]  Physical Therapy Treatment  Patient Details  Name: Christian [name] MRN: [id number] Date of Birth: 1941-09-21 Referring Provider: Chana Bode, MD  Encounter Date: 11/11/2016      PT End of Session - 11/11/16 1459    Visit Number 9   Number of Visits 16   Date for PT Re-Evaluation 11/25/16   PT Start Time [id number]   PT Stop Time 1526   PT Time Calculation (min) 40 min   Activity Tolerance Patient tolerated treatment well   Behavior During Therapy [facility] for tasks assessed/performed      Past Medical History:  Diagnosis Date  . Agitation   . Arthritis   . Cataract   . Chronic back pain   . Confusion   . Diabetes ([place])    type 2  . DVT (deep venous thrombosis) ([place]) 01/2015  . Hearing impaired   . High cholesterol   . Hypertension   . Insomnia   . Insomnia   . Leg pain, diffuse   . Leg swelling   . Memory loss   . Mild cognitive impairment    sees [facility]rology  . Numbness    fingers, feet, toes  . OSA on CPAP   . 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 06/14/2015  . Pulmonary embolism ([place]) 01/2015  . Spinal stenosis    lumbar  . Tremor    on propranolol  . Wears glasses     Past Surgical History:  Procedure Laterality Date  . CATARACT EXTRACTION    . COLONOSCOPY  2014  . TONSILLECTOMY      There were no vitals filed for this visit.      Subjective Assessment - 11/11/16 1445    Subjective Patient reported doing "some better" today   Pertinent History Patient was diagnosed with blood clots in bialteral LEs and in lungs (Saddle clot) in March 2016; DM, OA, stenosis, neuropathy   How long can you stand comfortably? 10 minute   How long can you walk comfortably? .25 mile   Patient Stated Goals Be able to walk safely, decrease shoulder pain.   Currently in Pain? Yes    Pain Score 5    Pain Location Shoulder   Pain Orientation Right;Left   Pain Descriptors / Indicators Aching   Pain Type Chronic pain   Pain Onset More than a month ago   Pain Frequency Constant   Aggravating Factors  lifting   Pain Relieving Factors rest            [facility] PT Assessment - 11/11/16 0001      AROM   AROM Assessment Site Shoulder   Right/Left Shoulder Right;Left   Right Shoulder Flexion 110 Degrees   Left Shoulder Flexion 110 Degrees     PROM   PROM Assessment Site Shoulder   Right/Left Shoulder Right;Left   Right Shoulder Flexion 120 Degrees   Left Shoulder Flexion 125 Degrees                     [facility] Adult PT Treatment/Exercise - 11/11/16 0001      Knee/Hip Exercises: Aerobic   Nustep level 5 x 15 mins with UEs and LEs     Shoulder Exercises: Standing   Protraction Strengthening;Both;20 reps;Theraband   Theraband Level (Shoulder Protraction) Level 1 (Yellow)   External Rotation Strengthening;Both;20 reps;Theraband  Theraband Level (Shoulder External Rotation) Level 1 (Yellow)   Internal Rotation Strengthening;Both;20 reps;Theraband   Theraband Level (Shoulder Internal Rotation) Level 1 (Yellow)   Row Strengthening;Both;20 reps;Theraband   Theraband Level (Shoulder Row) Level 1 (Yellow)     Shoulder Exercises: Pulleys   Flexion Other (comment)  16min             Balance Exercises - 11/11/16 1515      Balance Exercises: Standing   Standing Eyes Opened Wide (BOA);Foam/compliant surface   Rockerboard Anterior/posterior  26min   Step Ups 6 inch;Forward;UE support 1  2x10             PT Short Term Goals - 10/21/16 1253      PT SHORT TERM GOAL #1   Title Ind with an initial HEP.   Time 2   Status Achieved     PT SHORT TERM GOAL #2   Title --   Time --   Period --           PT Long Term Goals - 11/11/16 1459      PT LONG TERM GOAL #1   Title Ind with an advanced HEP.   Time 8   Period Weeks   Status  On-going     PT LONG TERM GOAL #2   Title Patient will be able to complete Berg balance test with a score of at least 50 to demonstrate reduced overall fall risk    Time 8   Period Weeks   Status On-going     PT LONG TERM GOAL #3   Title 5/5 bilateral LE strength grade to improve ease of perfoming functional movements and activites.   Time 8   Period Weeks   Status On-going     PT LONG TERM GOAL #4   Title Pt to demo improved B shoulder flexion to 140 deg in standing    Time 8   Period Weeks   Status On-going     PT LONG TERM GOAL #5   Title Patient to report decreased shoulder pain to 2/10 or less with ADLS   Time 8   Period Weeks   Status On-going  5+/10 11/11/16               Plan - 11/11/16 1509    Clinical Impression Statement Patient tolerated treatment well today. Patient progressing slowly due to pain limitations. Patient able to resch overhead yet not with full range and very painful up to 5+/10 pain. Patient has improved with balance activities and has reported no falls. Patient current goals ongoing due to ROM, strength, pain and balance deficts.   Rehab Potential Good   PT Frequency 2x / week   PT Duration 6 weeks   PT Treatment/Interventions ADLs/Self Care Home Management;Therapeutic exercise;Therapeutic activities;Gait training;Balance training;Neuromuscular re-education;Patient/family education;Electrical Stimulation;Cryotherapy;Moist Heat;Manual techniques;Passive range of motion;Dry needling   PT Next Visit Plan cont with [id number] shoulder therex and balance as tolerated   Consulted and Agree with Plan of Care Patient      Patient will benefit from skilled therapeutic intervention in order to improve the following deficits and impairments:  Decreased range of motion, Difficulty walking, Pain, Decreased activity tolerance, Decreased balance, Decreased strength, Postural dysfunction  Visit Diagnosis: Unsteadiness  Chronic left shoulder  pain  Chronic right shoulder pain  Muscle weakness (generalized)     Problem List Patient Active Problem List   Diagnosis Date Noted  . Left wrist pain 06/26/2016  . Morning joint stiffness 06/26/2016  .  Polyarthralgia 06/26/2016  . Type 2 diabetes mellitus with complication, without long-term current use of insulin ([place]) 04/02/2016  . Gait disturbance 04/02/2016  . Physical deconditioning 04/02/2016  . Pain of both shoulder joints 04/02/2016  . Leg weakness, bilateral 04/02/2016  . Encounter for therapeutic drug monitoring 11/29/2015  . Encounter for health maintenance examination in adult 10/30/2015  . Shoulder pain, bilateral 10/30/2015  . Muscle weakness 10/30/2015  . Generalized weakness 10/30/2015  . Skin lesion 10/30/2015  . PVC (premature ventricular contraction) 10/10/2015  . Diabetes type 2, controlled ([place]) 06/20/2015  . Decreased energy 06/20/2015  . Pulmonary embolism ([place]) 06/20/2015  . DVT (deep venous thrombosis) ([place]) 06/20/2015  . Hammer toe of left foot 06/20/2015  . Pre-ulcerative calluses 06/20/2015  . Hypertrophic toenail 06/20/2015  . Diabetic mononeuropathy associated with diabetes mellitus due to underlying condition ([place]) 06/20/2015  . Leg pain, bilateral 06/20/2015  . Chronic low back pain 06/20/2015  . 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 06/14/2015  . Diabetes type 2, uncontrolled ([place]) 03/06/2015  . OSA on CPAP 03/06/2015  . Edema 03/06/2015  . Leg pain, diffuse 03/06/2015  . Bilateral low back pain without sciatica 03/06/2015  . Tremor 03/06/2015  . Insomnia 03/06/2015  . Vaccine counseling 03/06/2015  . Chronic back pain 03/06/2015  . Wears glasses 03/06/2015  . Hearing loss 03/06/2015  . Spinal stenosis of lumbar region 03/06/2015  . Ptosis 03/06/2015  . Pulmonary emboli ([place]) 03/06/2015  . Positive depression screening 03/06/2015  . Advance directive discussed with patient  03/06/2015  . Bradycardia 02/27/2015  . Long-term (current) use of anticoagulants   . Dementia   . Essential hypertension 02/08/2015  . Hyperlipidemia 02/08/2015  . SOB (shortness of breath) 02/08/2015  . OSA (obstructive sleep apnea) 02/08/2015  . Mild cognitive impairment 02/08/2015  . PE (pulmonary embolism) 01/31/2015    Shona Pardo P, PTA 11/11/2016, 3:26 PM  [facility] [place], [place], [address] Phone: [phone]   Fax:  [phone]  Name: Christian [name] MRN: [id number] Date of Birth: May 31, 1941

## 2016-11-13 ENCOUNTER — Encounter: Payer: Self-pay | Admitting: Physical Therapy

## 2016-11-13 ENCOUNTER — Ambulatory Visit: Payer: Medicare Other | Admitting: Physical Therapy

## 2016-11-13 DIAGNOSIS — R2681 Unsteadiness on feet: Secondary | ICD-10-CM

## 2016-11-13 DIAGNOSIS — G8929 Other chronic pain: Secondary | ICD-10-CM

## 2016-11-13 DIAGNOSIS — M25512 Pain in left shoulder: Secondary | ICD-10-CM | POA: Diagnosis not present

## 2016-11-13 DIAGNOSIS — M6281 Muscle weakness (generalized): Secondary | ICD-10-CM | POA: Diagnosis not present

## 2016-11-13 DIAGNOSIS — M25511 Pain in right shoulder: Secondary | ICD-10-CM | POA: Diagnosis not present

## 2016-11-13 NOTE — Therapy (Addendum)
[facility] [place], [place], [address] Phone: [phone]   Fax:  [phone]  Physical Therapy Treatment  Patient Details  Name: Christian [name] MRN: [id number] Date of Birth: 07/30/1941 Referring Provider: Chana Bode, MD  Encounter Date: 11/13/2016      PT End of Session - 11/13/16 1446    Visit Number 10   Number of Visits 16   Date for PT Re-Evaluation 11/25/16   PT Start Time 9767   PT Stop Time 1518   PT Time Calculation (min) 41 min   Activity Tolerance Patient tolerated treatment well   Behavior During Therapy [facility] for tasks assessed/performed      Past Medical History:  Diagnosis Date  . Agitation   . Arthritis   . Cataract   . Chronic back pain   . Confusion   . Diabetes ([place])    type 2  . DVT (deep venous thrombosis) ([place]) 01/2015  . Hearing impaired   . High cholesterol   . Hypertension   . Insomnia   . Insomnia   . Leg pain, diffuse   . Leg swelling   . Memory loss   . Mild cognitive impairment    sees [facility] Neurology  . Numbness    fingers, feet, toes  . OSA on CPAP   . 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 06/14/2015  . Pulmonary embolism ([place]) 01/2015  . Spinal stenosis    lumbar  . Tremor    on propranolol  . Wears glasses     Past Surgical History:  Procedure Laterality Date  . CATARACT EXTRACTION    . COLONOSCOPY  2014  . TONSILLECTOMY      There were no vitals filed for this visit.      Subjective Assessment - 11/13/16 1444    Subjective Reports that he feels alright today and reports overall pain. Denies any recent falls or LOB although wife called clinic reporting 2 falls in the past month with one fall occuring at 3 am in the morning and they had to get a neighbor to help them.   Pertinent History Patient was diagnosed with blood clots in bialteral LEs and in lungs (Saddle clot) in March 2016; DM, OA, stenosis,  neuropathy   How long can you stand comfortably? 10 minute   How long can you walk comfortably? .25 mile   Patient Stated Goals Be able to walk safely, decrease shoulder pain.   Currently in Pain? Other (Comment)  Reported "all over pain" but no rating or description            [facility] PT Assessment - 11/13/16 0001      Assessment   Medical Diagnosis B shoulder pain, weakness, h/o PE   Onset Date/Surgical Date 01/24/15   Hand Dominance Right     Precautions   Precaution Comments Blood thinner.     Restrictions   Weight Bearing Restrictions No                     [facility] Adult PT Treatment/Exercise - 11/13/16 0001      Standardized Balance Assessment   Standardized Balance Assessment Berg Balance Test     Berg Balance Test   Sit to Stand Able to stand without using hands and stabilize independently   Standing Unsupported Able to stand safely 2 minutes   Sitting with Back Unsupported but Feet Supported on Floor or Stool Able to sit  safely and securely 2 minutes   Stand to Sit Controls descent by using hands   Transfers Able to transfer safely, minor use of hands   Standing Unsupported with Eyes Closed Able to stand 10 seconds safely   Standing Ubsupported with Feet Together Able to place feet together independently and stand 1 minute safely   From Standing, Reach Forward with Outstretched Arm Can reach forward >5 cm safely (2")   From Standing Position, Pick up Object from Floor Able to pick up shoe, needs supervision   From Standing Position, Turn to Look Behind Over each Shoulder Turn sideways only but maintains balance   Turn 360 Degrees Able to turn 360 degrees safely but slowly   Standing Unsupported, Alternately Place Feet on Step/Stool Able to stand independently and complete 8 steps >20 seconds   Standing Unsupported, One Foot in Front Able to plae foot ahead of the other independently and hold 30 seconds   Standing on One Leg Able to lift leg independently  and hold 5-10 seconds   Total Score 45     Knee/Hip Exercises: Aerobic   Nustep L5 x15 min     Shoulder Exercises: Standing   Horizontal ABduction Strengthening;Both;20 reps;Theraband   Theraband Level (Shoulder Horizontal ABduction) Level 1 (Yellow)   External Rotation Strengthening;Both;Theraband  3x10 reps   Theraband Level (Shoulder External Rotation) Level 1 (Yellow)   Internal Rotation Strengthening;Both;Theraband  3x10 reps   Theraband Level (Shoulder Internal Rotation) Level 1 (Yellow)   Extension Strengthening;Both;Theraband   Theraband Level (Shoulder Extension) Level 1 (Yellow)   Extension Limitations 3x10 reps   Row Strengthening;Both;Theraband   Theraband Level (Shoulder Row) Level 1 (Yellow)   Row Limitations 3x10 reps                  PT Short Term Goals - 10/21/16 1253      PT SHORT TERM GOAL #1   Title Ind with an initial HEP.   Time 2   Status Achieved     PT SHORT TERM GOAL #2   Title --   Time --   Period --           PT Long Term Goals - 11/11/16 1459      PT LONG TERM GOAL #1   Title Ind with an advanced HEP.   Time 8   Period Weeks   Status On-going     PT LONG TERM GOAL #2   Title Patient will be able to complete Berg balance test with a score of at least 50 to demonstrate reduced overall fall risk    Time 8   Period Weeks   Status On-going     PT LONG TERM GOAL #3   Title 5/5 bilateral LE strength grade to improve ease of perfoming functional movements and activites.   Time 8   Period Weeks   Status On-going     PT LONG TERM GOAL #4   Title Pt to demo improved B shoulder flexion to 140 deg in standing    Time 8   Period Weeks   Status On-going     PT LONG TERM GOAL #5   Title Patient to report decreased shoulder pain to 2/10 or less with ADLS   Time 8   Period Weeks   Status On-going  5+/10 11/11/16               Plan - 11/13/16 1523    Clinical Impression Statement Patient arrived to treatment with  denial of any recent falls although his wife called in today reporting two falls in the past two months with on occuring at 3 am and required a neighbor to help. Patient arrived to clinic with reports of "all over" pain but no rating or description provided. Patient able to complete B shoulder strengthening with yellow theraband with no reports of pain only intermittant decrease in speed and form for unknown reason as patient had his eyes open. Tactile and visual cues were required for improved form. Patient continues to demonstrate decreased BERG score with 45 rating today. Patient completed all exercise in slow speed.   Rehab Potential Good   PT Frequency 2x / week   PT Duration 6 weeks   PT Treatment/Interventions ADLs/Self Care Home Management;Therapeutic exercise;Therapeutic activities;Gait training;Balance training;Neuromuscular re-education;Patient/family education;Electrical Stimulation;Cryotherapy;Moist Heat;Manual techniques;Passive range of motion;Dry needling   PT Next Visit Plan cont with BWG:[id number] shoulder therex and balance as tolerated   Consulted and Agree with Plan of Care Patient      Patient will benefit from skilled therapeutic intervention in order to improve the following deficits and impairments:  Decreased range of motion, Difficulty walking, Pain, Decreased activity tolerance, Decreased balance, Decreased strength, Postural dysfunction  Visit Diagnosis: Unsteadiness  Chronic left shoulder pain  Chronic right shoulder pain  Muscle weakness (generalized)     Problem List Patient Active Problem List   Diagnosis Date Noted  . Left wrist pain 06/26/2016  . Morning joint stiffness 06/26/2016  . Polyarthralgia 06/26/2016  . Type 2 diabetes mellitus with complication, without long-term current use of insulin ([place]) 04/02/2016  . Gait disturbance 04/02/2016  . Physical deconditioning 04/02/2016  . Pain of both shoulder joints 04/02/2016  . Leg weakness, bilateral  04/02/2016  . Encounter for therapeutic drug monitoring 11/29/2015  . Encounter for health maintenance examination in adult 10/30/2015  . Shoulder pain, bilateral 10/30/2015  . Muscle weakness 10/30/2015  . Generalized weakness 10/30/2015  . Skin lesion 10/30/2015  . PVC (premature ventricular contraction) 10/10/2015  . Diabetes type 2, controlled ([place]) 06/20/2015  . Decreased energy 06/20/2015  . Pulmonary embolism ([place]) 06/20/2015  . DVT (deep venous thrombosis) ([place]) 06/20/2015  . Hammer toe of left foot 06/20/2015  . Pre-ulcerative calluses 06/20/2015  . Hypertrophic toenail 06/20/2015  . Diabetic mononeuropathy associated with diabetes mellitus due to underlying condition ([place]) 06/20/2015  . Leg pain, bilateral 06/20/2015  . Chronic low back pain 06/20/2015  . 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 06/14/2015  . Diabetes type 2, uncontrolled ([place]) 03/06/2015  . OSA on CPAP 03/06/2015  . Edema 03/06/2015  . Leg pain, diffuse 03/06/2015  . Bilateral low back pain without sciatica 03/06/2015  . Tremor 03/06/2015  . Insomnia 03/06/2015  . Vaccine counseling 03/06/2015  . Chronic back pain 03/06/2015  . Wears glasses 03/06/2015  . Hearing loss 03/06/2015  . Spinal stenosis of lumbar region 03/06/2015  . Ptosis 03/06/2015  . Pulmonary emboli ([place]) 03/06/2015  . Positive depression screening 03/06/2015  . Advance directive discussed with patient 03/06/2015  . Bradycardia 02/27/2015  . Long-term (current) use of anticoagulants   . Dementia   . Essential hypertension 02/08/2015  . Hyperlipidemia 02/08/2015  . SOB (shortness of breath) 02/08/2015  . OSA (obstructive sleep apnea) 02/08/2015  . Mild cognitive impairment 02/08/2015  . PE (pulmonary embolism) 01/31/2015    Ahmed Prima, PTA 11/13/16 3:28 PM  [facility] [address] [place], [place], [address] Phone: [phone]  Fax:  [phone]  Name: Christian [name] MRN: [id number] Date of Birth: Jul 08, 1941  PHYSICAL THERAPY DISCHARGE SUMMARY  Visits from Start of Care: 10.  Current functional level related to goals / functional outcomes: See above.   Remaining deficits: See goal section.   Education / Equipment: HEP. Plan: Patient agrees to discharge.  Patient goals were not met. Patient is being discharged due to not returning since the last visit.  ?????          Mali Applegate MPT

## 2016-11-20 ENCOUNTER — Ambulatory Visit: Payer: Medicare Other | Admitting: Physical Therapy

## 2016-11-24 ENCOUNTER — Other Ambulatory Visit: Payer: Self-pay | Admitting: Medical

## 2016-11-26 NOTE — Telephone Encounter (Signed)
Pt has an appt this week. Will send in med

## 2016-11-27 ENCOUNTER — Telehealth: Payer: Self-pay | Admitting: Medical

## 2016-11-27 ENCOUNTER — Ambulatory Visit (INDEPENDENT_AMBULATORY_CARE_PROVIDER_SITE_OTHER): Payer: Medicare Other | Admitting: Medical

## 2016-11-27 ENCOUNTER — Encounter: Payer: Self-pay | Admitting: Medical

## 2016-11-27 VITALS — BP 132/84 | HR 73 | Wt 215.6 lb

## 2016-11-27 DIAGNOSIS — I10 Essential (primary) hypertension: Secondary | ICD-10-CM | POA: Diagnosis not present

## 2016-11-27 DIAGNOSIS — R5383 Other fatigue: Secondary | ICD-10-CM

## 2016-11-27 DIAGNOSIS — E0841 Diabetes mellitus due to underlying condition with diabetic mononeuropathy: Secondary | ICD-10-CM | POA: Diagnosis not present

## 2016-11-27 DIAGNOSIS — R29898 Other symptoms and signs involving the musculoskeletal system: Secondary | ICD-10-CM

## 2016-11-27 DIAGNOSIS — F0390 Unspecified dementia without behavioral disturbance: Secondary | ICD-10-CM

## 2016-11-27 DIAGNOSIS — R531 Weakness: Secondary | ICD-10-CM | POA: Diagnosis not present

## 2016-11-27 DIAGNOSIS — R0602 Shortness of breath: Secondary | ICD-10-CM | POA: Diagnosis not present

## 2016-11-27 NOTE — Telephone Encounter (Signed)
2 referrals  Get a home health agency involved to make adjustment to his CPAP.  He feels like the pressure is too strong, suffocating feeling  Refer to home health nurse to come out and do home health evaluation of need and to review fall prevention, identify fall risks, given his frequent falls despite use of cane, therapy.

## 2016-11-27 NOTE — Progress Notes (Signed)
Subjective: Chief Complaint  Patient presents with  . discuss refills , weakness    weakness, falling x2-3 , discuss meds refill    Here for f/u with wife.   Having falls, losing balance, has no energy.  Before holidays was still going twice per week to therapy.  Its like he is skating when he walks.  Has no energy to do anything. glucose running around 150s.    Wife notes episode 2 weeks ago where he felt weak. She was scared he had stroke, called EMS.  Was observed in the ED for a while then released.    In general his recent falls are random.  Sometimes he falls walking in the house to check on the dogs, gets weak or unsteady on gait.  He has had falls in the shower.  He does use cane, there are several hand holds available in the bathroom, they have good lighting.  He attributes to weakness  He isn't using CPAP often,feels too forceful.  Hasn't contacted supplier about adjustments  They wonder about his testosterone  He sees cardiology next month  No chest pain, no edema, no SOB.  Still gets some shoulder pains but not as bad as they had been months ago.  Still compliant with xarelto, BP medication, trulicity, invokamet.  meds reviewed.   Past Medical History:  Diagnosis Date  . Agitation   . Arthritis   . Cataract   . Chronic back pain   . Confusion   . Diabetes ([place])    type 2  . DVT (deep venous thrombosis) ([place]) 01/2015  . Hearing impaired   . High cholesterol   . Hypertension   . Insomnia   . Insomnia   . Leg pain, diffuse   . Leg swelling   . Memory loss   . Mild cognitive impairment    sees [facility] Neurology  . Numbness    fingers, feet, toes  . OSA on CPAP   . 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 06/14/2015  . Pulmonary embolism ([place]) 01/2015  . Spinal stenosis    lumbar  . Tremor    on propranolol  . Wears glasses    Current Outpatient Prescriptions on File Prior to Visit  Medication Sig Dispense  Refill  . Canagliflozin-Metformin HCl (INVOKAMET) [phone] MG TABS Take 1 tablet by mouth 2 (two) times daily. 180 tablet 1  . diltiazem (CARDIZEM) 30 MG tablet Take 1 tablet (30 mg total) by mouth 2 (two) times daily. 60 tablet 11  . donepezil (ARICEPT) 10 MG tablet TAKE 1 TABLET BY MOUTH EVERY NIGHT AT BEDTIME 30 tablet 6  . flecainide (TAMBOCOR) 150 MG tablet TAKE 1/2 TABLET BY MOUTH TWICE DAILY 90 tablet 3  . glucose blood (TRUE METRIX BLOOD GLUCOSE TEST) test strip Use as instructed 100 each 12  . Misc Natural Products (OSTEO BI-FLEX ADV JOINT SHIELD PO) Take 1 tablet by mouth daily.     . rivaroxaban (XARELTO) 20 MG TABS tablet Take 1 tablet (20 mg total) by mouth daily with supper. 30 tablet 11  . tamsulosin (FLOMAX) 0.4 MG CAPS capsule TAKE 1 CAPSULE(0.4 MG) BY MOUTH DAILY 30 capsule 5  . TRULICITY 1.5 0000000 SOPN INJECT 1.5 MG INTO THE SKIN ONCE A WEEK 2 mL 0  . iron polysaccharides (NIFEREX) 150 MG capsule Take 1 capsule (150 mg total) by mouth daily. 30 capsule 6   No current facility-administered medications on file prior to visit.  ROS as in subjective   Objective BP 132/84   Pulse 73   Wt 215 lb 9.6 oz (97.8 kg)   SpO2 97%   BMI 30.07 kg/m   Wt Readings from Last 3 Encounters:  11/27/16 215 lb 9.6 oz (97.8 kg)  07/15/16 213 lb (96.6 kg)  06/26/16 214 lb (97.1 kg)   General appearance: alert, no distress, WD/WN,  HEENT: normocephalic, sclerae anicteric, TMs pearly, nares patent, no discharge or erythema, pharynx normal Oral cavity: MMM, no lesions Neck: supple, no lymphadenopathy, no thyromegaly, no masses, no JVD Heart: RRR, normal S1, S2, no murmurs Lungs: CTA bilaterally, no wheezes, rhonchi, or rales Ext:no edema Pulses: 2+ symmetric, upper and lower extremities, normal cap refill Neuro: legs 4-5/5 strength, otherwise gait is slow, cautious, CN2-12 intact, answers questions appropriately    Assessment: Encounter Diagnoses  Name Primary?  . Leg  weakness, bilateral Yes  . Dementia without behavioral disturbance, unspecified dementia type   . Essential hypertension   . Diabetic mononeuropathy associated with diabetes mellitus due to underlying condition ([place])   . Decreased energy   . Generalized weakness     Plan: Reviewed neurology notes from 07/15/16.  He was diagnosed with peripheral neuropathy which can affect gait and balance, likely due to diabetes.   Was advised to use cane, to sit down in shower.  No evidence of Parkinson's disease.  Memory change due to likely dementia.  Is on Aricept   Labs today  Will have home health come out and do home eval to address fall prevention, identify fall risks, identify other needs  Will refer to supplier/home health to adjust his CPAP as pressure may be too strong causing noncompliance  C/t PT/rehab  Discussed he and wife discussed his concerns and not having too high expectations.  There may just be unreasonable expectations on behalf of his wife in regards to what he can do physical or what he has the desire to do.  His neuropathy, age, dementia all are likely playing a role in regards to his fatigue.    C/t to work on reducing falls. discussed falls, risks, preventive measures  Will likely make changes to his diabetes regimen pending labs  PFTs - abnormal.  We will call back with recommendations since he left after PFTs.  F/u with cardiology next month  Drelan was seen today for discuss refills , weakness.  Diagnoses and all orders for this visit:  Leg weakness, bilateral -     Hemoglobin A1c -     TSH -     Testosterone  Dementia without behavioral disturbance, unspecified dementia type -     Hemoglobin A1c -     TSH -     Testosterone  Essential hypertension -     Hemoglobin A1c -     TSH -     Testosterone  Diabetic mononeuropathy associated with diabetes mellitus due to underlying condition ([facility]) -     Hemoglobin A1c -     TSH -     Testosterone -     Vitamin  B12  Decreased energy -     Hemoglobin A1c -     TSH -     Testosterone  Generalized weakness -     Hemoglobin A1c -     TSH -     Testosterone -     Spirometry with graph  Spent > 45 minutes face to face with patient in discussion of symptoms, evaluation, plan and recommendations.

## 2016-11-28 ENCOUNTER — Other Ambulatory Visit: Payer: Self-pay | Admitting: Medical

## 2016-11-28 DIAGNOSIS — D509 Iron deficiency anemia, unspecified: Secondary | ICD-10-CM

## 2016-11-28 LAB — TSH: TSH: 1.31 m[IU]/L (ref 0.40–4.50)

## 2016-11-28 LAB — HEMOGLOBIN A1C
Hgb A1c MFr Bld: 7.9 % — ABNORMAL HIGH (ref <5.7)
MEAN PLASMA GLUCOSE: 180 mg/dL

## 2016-11-28 LAB — VITAMIN B12: Vitamin B-12: 576 pg/mL (ref 200–1100)

## 2016-11-28 LAB — TESTOSTERONE: TESTOSTERONE: 318 ng/dL (ref 250–827)

## 2016-11-28 MED ORDER — TAMSULOSIN HCL 0.4 MG PO CAPS: ORAL_CAPSULE | ORAL | 1 refills | Status: DC

## 2016-11-28 MED ORDER — POLYSACCHARIDE IRON COMPLEX 150 MG PO CAPS: 150.0000 mg | ORAL_CAPSULE | Freq: Every day | ORAL | 3 refills | Status: DC

## 2016-11-28 MED ORDER — INSULIN DEGLUDEC-LIRAGLUTIDE 100-3.6 UNIT-MG/ML ~~LOC~~ SOPN: 16.0000 [IU] | PEN_INJECTOR | Freq: Every day | SUBCUTANEOUS | 2 refills | Status: DC

## 2016-11-28 MED ORDER — METFORMIN HCL 850 MG PO TABS
850.0000 mg | ORAL_TABLET | Freq: Two times a day (BID) | ORAL | 0 refills | Status: DC
Start: 2016-11-28 — End: 2017-02-28

## 2016-11-28 MED ORDER — RIVAROXABAN 20 MG PO TABS: 20.0000 mg | ORAL_TABLET | Freq: Every day | ORAL | 3 refills | Status: DC

## 2016-11-28 MED ORDER — BUDESONIDE-FORMOTEROL FUMARATE 160-4.5 MCG/ACT IN AERO: 2.0000 | INHALATION_SPRAY | Freq: Two times a day (BID) | RESPIRATORY_TRACT | 3 refills | Status: DC

## 2016-11-28 NOTE — Telephone Encounter (Signed)
Faxed referral to [facility].

## 2016-11-28 NOTE — Progress Notes (Signed)
Labs ok, except diabetes marker not at goal.  See message below as well as other message about home health.  Changes/recommendations: 1-I want to change his diabetes medications to plain metformin instead of Invokamet and change to Xultophy injection instead of Trulicity.  This assumes they are reasonable costs and covered by insurance.   If the Claris Che is not covered by insurance, then we will need to use something else.   Until he gets the 2 new medications, finish out the Invokamet and Trulicity he has now.  2-continue to monitor sugars, goal <130 in the mornings fasting  3-begin a trial of a medication called Symbicort.  This is 2 puffs twice daily to help lung function/breathing.  He can have pharmacist explain how to use this.    His breathing test was abnormal.  Its hard to say if he has a lung disease or if this is still related to the large blood clots.  Either way, given his fatigue, its worth a try.  This also assumes is not too excessive and covered by insurance.   Let me know if too expensive.    4- c/t rehab/physical therapy  F/u in 3-4 wk

## 2016-12-01 DIAGNOSIS — R42 Dizziness and giddiness: Secondary | ICD-10-CM | POA: Diagnosis not present

## 2016-12-01 DIAGNOSIS — T50904A Poisoning by unspecified drugs, medicaments and biological substances, undetermined, initial encounter: Secondary | ICD-10-CM | POA: Diagnosis not present

## 2016-12-01 DIAGNOSIS — R404 Transient alteration of awareness: Secondary | ICD-10-CM | POA: Diagnosis not present

## 2016-12-01 DIAGNOSIS — M5489 Other dorsalgia: Secondary | ICD-10-CM | POA: Diagnosis not present

## 2016-12-02 DIAGNOSIS — E1141 Type 2 diabetes mellitus with diabetic mononeuropathy: Secondary | ICD-10-CM | POA: Diagnosis not present

## 2016-12-02 DIAGNOSIS — M25512 Pain in left shoulder: Secondary | ICD-10-CM | POA: Diagnosis not present

## 2016-12-02 DIAGNOSIS — R296 Repeated falls: Secondary | ICD-10-CM | POA: Diagnosis not present

## 2016-12-02 DIAGNOSIS — I10 Essential (primary) hypertension: Secondary | ICD-10-CM | POA: Diagnosis not present

## 2016-12-02 DIAGNOSIS — M25511 Pain in right shoulder: Secondary | ICD-10-CM | POA: Diagnosis not present

## 2016-12-02 DIAGNOSIS — M545 Low back pain: Secondary | ICD-10-CM | POA: Diagnosis not present

## 2016-12-03 ENCOUNTER — Ambulatory Visit: Payer: Medicare Other | Admitting: Physical Therapy

## 2016-12-03 ENCOUNTER — Telehealth: Payer: Self-pay

## 2016-12-03 NOTE — Telephone Encounter (Signed)
Benjamine Mola- at [facility] called to let us know there was a delay in start of care for pt, d/t pt request.  # [phone].  Elizabeth request verbal for the orders below: 3 x week for 1 week 2 x week for 3 week 1 x week for 2 week Order PT eval. ///RLB

## 2016-12-03 NOTE — Telephone Encounter (Signed)
That is fine 

## 2016-12-03 NOTE — Telephone Encounter (Signed)
LM on VCM that it is ok for requested orders. Victorino December

## 2016-12-04 ENCOUNTER — Ambulatory Visit (INDEPENDENT_AMBULATORY_CARE_PROVIDER_SITE_OTHER): Payer: Medicare Other | Admitting: Nurse Practitioner

## 2016-12-04 ENCOUNTER — Encounter: Payer: Self-pay | Admitting: Nurse Practitioner

## 2016-12-04 VITALS — BP 122/75 | HR 78 | Ht 71.0 in | Wt 216.8 lb

## 2016-12-04 DIAGNOSIS — M545 Low back pain: Secondary | ICD-10-CM | POA: Diagnosis not present

## 2016-12-04 DIAGNOSIS — M25512 Pain in left shoulder: Secondary | ICD-10-CM | POA: Diagnosis not present

## 2016-12-04 DIAGNOSIS — F0390 Unspecified dementia without behavioral disturbance: Secondary | ICD-10-CM

## 2016-12-04 DIAGNOSIS — M25511 Pain in right shoulder: Secondary | ICD-10-CM | POA: Diagnosis not present

## 2016-12-04 DIAGNOSIS — R269 Unspecified abnormalities of gait and mobility: Secondary | ICD-10-CM | POA: Diagnosis not present

## 2016-12-04 DIAGNOSIS — E1141 Type 2 diabetes mellitus with diabetic mononeuropathy: Secondary | ICD-10-CM | POA: Diagnosis not present

## 2016-12-04 DIAGNOSIS — R296 Repeated falls: Secondary | ICD-10-CM | POA: Diagnosis not present

## 2016-12-04 DIAGNOSIS — I10 Essential (primary) hypertension: Secondary | ICD-10-CM | POA: Diagnosis not present

## 2016-12-04 MED ORDER — MEMANTINE HCL 5 MG PO TABS: 5.0000 mg | ORAL_TABLET | Freq: Two times a day (BID) | ORAL | 6 refills | Status: DC

## 2016-12-04 MED ORDER — DONEPEZIL HCL 10 MG PO TABS: 10.0000 mg | ORAL_TABLET | Freq: Every day | ORAL | 6 refills | Status: DC
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
## 2016-12-04 NOTE — Patient Instructions (Signed)
Continue Aricept at current dose Namenda 5 mg 1 by mouth daily for 2 weeks then 1 twice daily After therapies conclude  need to get some exercise every day Follow-up in 6 months

## 2016-12-05 ENCOUNTER — Ambulatory Visit: Payer: Medicare Other | Admitting: Physical Therapy

## 2016-12-05 DIAGNOSIS — I10 Essential (primary) hypertension: Secondary | ICD-10-CM | POA: Diagnosis not present

## 2016-12-05 DIAGNOSIS — M545 Low back pain: Secondary | ICD-10-CM | POA: Diagnosis not present

## 2016-12-05 DIAGNOSIS — M25512 Pain in left shoulder: Secondary | ICD-10-CM | POA: Diagnosis not present

## 2016-12-05 DIAGNOSIS — E1141 Type 2 diabetes mellitus with diabetic mononeuropathy: Secondary | ICD-10-CM | POA: Diagnosis not present

## 2016-12-05 DIAGNOSIS — M25511 Pain in right shoulder: Secondary | ICD-10-CM | POA: Diagnosis not present

## 2016-12-05 DIAGNOSIS — R296 Repeated falls: Secondary | ICD-10-CM | POA: Diagnosis not present

## 2016-12-06 DIAGNOSIS — I10 Essential (primary) hypertension: Secondary | ICD-10-CM | POA: Diagnosis not present

## 2016-12-06 DIAGNOSIS — M25511 Pain in right shoulder: Secondary | ICD-10-CM | POA: Diagnosis not present

## 2016-12-06 DIAGNOSIS — R296 Repeated falls: Secondary | ICD-10-CM | POA: Diagnosis not present

## 2016-12-06 DIAGNOSIS — E1141 Type 2 diabetes mellitus with diabetic mononeuropathy: Secondary | ICD-10-CM | POA: Diagnosis not present

## 2016-12-06 DIAGNOSIS — M545 Low back pain: Secondary | ICD-10-CM | POA: Diagnosis not present

## 2016-12-06 DIAGNOSIS — M25512 Pain in left shoulder: Secondary | ICD-10-CM | POA: Diagnosis not present

## 2016-12-09 DIAGNOSIS — M25511 Pain in right shoulder: Secondary | ICD-10-CM | POA: Diagnosis not present

## 2016-12-09 DIAGNOSIS — I10 Essential (primary) hypertension: Secondary | ICD-10-CM | POA: Diagnosis not present

## 2016-12-09 DIAGNOSIS — M25512 Pain in left shoulder: Secondary | ICD-10-CM | POA: Diagnosis not present

## 2016-12-09 DIAGNOSIS — M545 Low back pain: Secondary | ICD-10-CM | POA: Diagnosis not present

## 2016-12-09 DIAGNOSIS — R296 Repeated falls: Secondary | ICD-10-CM | POA: Diagnosis not present

## 2016-12-09 DIAGNOSIS — E1141 Type 2 diabetes mellitus with diabetic mononeuropathy: Secondary | ICD-10-CM | POA: Diagnosis not present

## 2016-12-16 DIAGNOSIS — E1141 Type 2 diabetes mellitus with diabetic mononeuropathy: Secondary | ICD-10-CM | POA: Diagnosis not present

## 2016-12-16 DIAGNOSIS — I10 Essential (primary) hypertension: Secondary | ICD-10-CM | POA: Diagnosis not present

## 2016-12-16 DIAGNOSIS — M25511 Pain in right shoulder: Secondary | ICD-10-CM | POA: Diagnosis not present

## 2016-12-16 DIAGNOSIS — M545 Low back pain: Secondary | ICD-10-CM | POA: Diagnosis not present

## 2016-12-16 DIAGNOSIS — M25512 Pain in left shoulder: Secondary | ICD-10-CM | POA: Diagnosis not present

## 2016-12-16 DIAGNOSIS — R296 Repeated falls: Secondary | ICD-10-CM | POA: Diagnosis not present

## 2016-12-17 DIAGNOSIS — I10 Essential (primary) hypertension: Secondary | ICD-10-CM | POA: Diagnosis not present

## 2016-12-17 DIAGNOSIS — E1141 Type 2 diabetes mellitus with diabetic mononeuropathy: Secondary | ICD-10-CM | POA: Diagnosis not present

## 2016-12-17 DIAGNOSIS — M25512 Pain in left shoulder: Secondary | ICD-10-CM | POA: Diagnosis not present

## 2016-12-17 DIAGNOSIS — M545 Low back pain: Secondary | ICD-10-CM | POA: Diagnosis not present

## 2016-12-17 DIAGNOSIS — R296 Repeated falls: Secondary | ICD-10-CM | POA: Diagnosis not present

## 2016-12-17 DIAGNOSIS — M25511 Pain in right shoulder: Secondary | ICD-10-CM | POA: Diagnosis not present

## 2016-12-18 DIAGNOSIS — M25511 Pain in right shoulder: Secondary | ICD-10-CM | POA: Diagnosis not present

## 2016-12-18 DIAGNOSIS — M25512 Pain in left shoulder: Secondary | ICD-10-CM | POA: Diagnosis not present

## 2016-12-18 DIAGNOSIS — R296 Repeated falls: Secondary | ICD-10-CM | POA: Diagnosis not present

## 2016-12-18 DIAGNOSIS — E1141 Type 2 diabetes mellitus with diabetic mononeuropathy: Secondary | ICD-10-CM | POA: Diagnosis not present

## 2016-12-18 DIAGNOSIS — I10 Essential (primary) hypertension: Secondary | ICD-10-CM | POA: Diagnosis not present

## 2016-12-18 DIAGNOSIS — M545 Low back pain: Secondary | ICD-10-CM | POA: Diagnosis not present

## 2016-12-22 ENCOUNTER — Other Ambulatory Visit: Payer: Self-pay | Admitting: Medical

## 2016-12-23 DIAGNOSIS — R296 Repeated falls: Secondary | ICD-10-CM | POA: Diagnosis not present

## 2016-12-23 DIAGNOSIS — M25512 Pain in left shoulder: Secondary | ICD-10-CM | POA: Diagnosis not present

## 2016-12-23 DIAGNOSIS — M25511 Pain in right shoulder: Secondary | ICD-10-CM | POA: Diagnosis not present

## 2016-12-23 DIAGNOSIS — E1141 Type 2 diabetes mellitus with diabetic mononeuropathy: Secondary | ICD-10-CM | POA: Diagnosis not present

## 2016-12-23 DIAGNOSIS — M545 Low back pain: Secondary | ICD-10-CM | POA: Diagnosis not present

## 2016-12-23 DIAGNOSIS — I10 Essential (primary) hypertension: Secondary | ICD-10-CM | POA: Diagnosis not present

## 2016-12-25 DIAGNOSIS — I10 Essential (primary) hypertension: Secondary | ICD-10-CM | POA: Diagnosis not present

## 2016-12-25 DIAGNOSIS — M25512 Pain in left shoulder: Secondary | ICD-10-CM | POA: Diagnosis not present

## 2016-12-25 DIAGNOSIS — R296 Repeated falls: Secondary | ICD-10-CM | POA: Diagnosis not present

## 2016-12-25 DIAGNOSIS — M25511 Pain in right shoulder: Secondary | ICD-10-CM | POA: Diagnosis not present

## 2016-12-25 DIAGNOSIS — E1141 Type 2 diabetes mellitus with diabetic mononeuropathy: Secondary | ICD-10-CM | POA: Diagnosis not present

## 2016-12-25 DIAGNOSIS — M545 Low back pain: Secondary | ICD-10-CM | POA: Diagnosis not present

## 2016-12-26 DIAGNOSIS — I10 Essential (primary) hypertension: Secondary | ICD-10-CM | POA: Diagnosis not present

## 2016-12-26 DIAGNOSIS — M25511 Pain in right shoulder: Secondary | ICD-10-CM | POA: Diagnosis not present

## 2016-12-26 DIAGNOSIS — E1141 Type 2 diabetes mellitus with diabetic mononeuropathy: Secondary | ICD-10-CM | POA: Diagnosis not present

## 2016-12-26 DIAGNOSIS — R296 Repeated falls: Secondary | ICD-10-CM | POA: Diagnosis not present

## 2016-12-26 DIAGNOSIS — M25512 Pain in left shoulder: Secondary | ICD-10-CM | POA: Diagnosis not present

## 2016-12-26 DIAGNOSIS — M545 Low back pain: Secondary | ICD-10-CM | POA: Diagnosis not present

## 2016-12-30 DIAGNOSIS — M545 Low back pain: Secondary | ICD-10-CM | POA: Diagnosis not present

## 2016-12-30 DIAGNOSIS — I10 Essential (primary) hypertension: Secondary | ICD-10-CM | POA: Diagnosis not present

## 2016-12-30 DIAGNOSIS — E1141 Type 2 diabetes mellitus with diabetic mononeuropathy: Secondary | ICD-10-CM | POA: Diagnosis not present

## 2016-12-30 DIAGNOSIS — R296 Repeated falls: Secondary | ICD-10-CM | POA: Diagnosis not present

## 2016-12-30 DIAGNOSIS — M25512 Pain in left shoulder: Secondary | ICD-10-CM | POA: Diagnosis not present

## 2016-12-30 DIAGNOSIS — M25511 Pain in right shoulder: Secondary | ICD-10-CM | POA: Diagnosis not present

## 2017-01-02 DIAGNOSIS — M545 Low back pain: Secondary | ICD-10-CM | POA: Diagnosis not present

## 2017-01-02 DIAGNOSIS — I10 Essential (primary) hypertension: Secondary | ICD-10-CM | POA: Diagnosis not present

## 2017-01-02 DIAGNOSIS — M25512 Pain in left shoulder: Secondary | ICD-10-CM | POA: Diagnosis not present

## 2017-01-02 DIAGNOSIS — R296 Repeated falls: Secondary | ICD-10-CM | POA: Diagnosis not present

## 2017-01-02 DIAGNOSIS — E1141 Type 2 diabetes mellitus with diabetic mononeuropathy: Secondary | ICD-10-CM | POA: Diagnosis not present

## 2017-01-02 DIAGNOSIS — M25511 Pain in right shoulder: Secondary | ICD-10-CM | POA: Diagnosis not present

## 2017-01-06 DIAGNOSIS — R296 Repeated falls: Secondary | ICD-10-CM | POA: Diagnosis not present

## 2017-01-06 DIAGNOSIS — I10 Essential (primary) hypertension: Secondary | ICD-10-CM | POA: Diagnosis not present

## 2017-01-06 DIAGNOSIS — E1141 Type 2 diabetes mellitus with diabetic mononeuropathy: Secondary | ICD-10-CM | POA: Diagnosis not present

## 2017-01-06 DIAGNOSIS — M545 Low back pain: Secondary | ICD-10-CM | POA: Diagnosis not present

## 2017-01-06 DIAGNOSIS — M25512 Pain in left shoulder: Secondary | ICD-10-CM | POA: Diagnosis not present

## 2017-01-06 DIAGNOSIS — M25511 Pain in right shoulder: Secondary | ICD-10-CM | POA: Diagnosis not present

## 2017-01-09 DIAGNOSIS — M25511 Pain in right shoulder: Secondary | ICD-10-CM | POA: Diagnosis not present

## 2017-01-09 DIAGNOSIS — M25512 Pain in left shoulder: Secondary | ICD-10-CM | POA: Diagnosis not present

## 2017-01-09 DIAGNOSIS — M545 Low back pain: Secondary | ICD-10-CM | POA: Diagnosis not present

## 2017-01-09 DIAGNOSIS — I10 Essential (primary) hypertension: Secondary | ICD-10-CM | POA: Diagnosis not present

## 2017-01-09 DIAGNOSIS — R296 Repeated falls: Secondary | ICD-10-CM | POA: Diagnosis not present

## 2017-01-09 DIAGNOSIS — E1141 Type 2 diabetes mellitus with diabetic mononeuropathy: Secondary | ICD-10-CM | POA: Diagnosis not present

## 2017-01-14 DIAGNOSIS — M25511 Pain in right shoulder: Secondary | ICD-10-CM | POA: Diagnosis not present

## 2017-01-14 DIAGNOSIS — I10 Essential (primary) hypertension: Secondary | ICD-10-CM | POA: Diagnosis not present

## 2017-01-14 DIAGNOSIS — E1141 Type 2 diabetes mellitus with diabetic mononeuropathy: Secondary | ICD-10-CM | POA: Diagnosis not present

## 2017-01-14 DIAGNOSIS — M25512 Pain in left shoulder: Secondary | ICD-10-CM | POA: Diagnosis not present

## 2017-01-14 DIAGNOSIS — R296 Repeated falls: Secondary | ICD-10-CM | POA: Diagnosis not present

## 2017-01-14 DIAGNOSIS — M545 Low back pain: Secondary | ICD-10-CM | POA: Diagnosis not present

## 2017-01-16 ENCOUNTER — Ambulatory Visit (HOSPITAL_COMMUNITY): Payer: Medicare Other

## 2017-01-16 DIAGNOSIS — I10 Essential (primary) hypertension: Secondary | ICD-10-CM | POA: Diagnosis not present

## 2017-01-16 DIAGNOSIS — R296 Repeated falls: Secondary | ICD-10-CM | POA: Diagnosis not present

## 2017-01-16 DIAGNOSIS — M25512 Pain in left shoulder: Secondary | ICD-10-CM | POA: Diagnosis not present

## 2017-01-16 DIAGNOSIS — M25511 Pain in right shoulder: Secondary | ICD-10-CM | POA: Diagnosis not present

## 2017-01-16 DIAGNOSIS — E1141 Type 2 diabetes mellitus with diabetic mononeuropathy: Secondary | ICD-10-CM | POA: Diagnosis not present

## 2017-01-16 DIAGNOSIS — M545 Low back pain: Secondary | ICD-10-CM | POA: Diagnosis not present

## 2017-01-18 ENCOUNTER — Other Ambulatory Visit: Payer: Self-pay | Admitting: Medical

## 2017-01-21 DIAGNOSIS — I10 Essential (primary) hypertension: Secondary | ICD-10-CM | POA: Diagnosis not present

## 2017-01-21 DIAGNOSIS — M545 Low back pain: Secondary | ICD-10-CM | POA: Diagnosis not present

## 2017-01-21 DIAGNOSIS — M25511 Pain in right shoulder: Secondary | ICD-10-CM | POA: Diagnosis not present

## 2017-01-21 DIAGNOSIS — E1141 Type 2 diabetes mellitus with diabetic mononeuropathy: Secondary | ICD-10-CM | POA: Diagnosis not present

## 2017-01-21 DIAGNOSIS — R296 Repeated falls: Secondary | ICD-10-CM | POA: Diagnosis not present

## 2017-01-21 DIAGNOSIS — M25512 Pain in left shoulder: Secondary | ICD-10-CM | POA: Diagnosis not present

## 2017-01-23 DIAGNOSIS — M25512 Pain in left shoulder: Secondary | ICD-10-CM | POA: Diagnosis not present

## 2017-01-23 DIAGNOSIS — E1141 Type 2 diabetes mellitus with diabetic mononeuropathy: Secondary | ICD-10-CM | POA: Diagnosis not present

## 2017-01-23 DIAGNOSIS — M25511 Pain in right shoulder: Secondary | ICD-10-CM | POA: Diagnosis not present

## 2017-01-23 DIAGNOSIS — I10 Essential (primary) hypertension: Secondary | ICD-10-CM | POA: Diagnosis not present

## 2017-01-23 DIAGNOSIS — M545 Low back pain: Secondary | ICD-10-CM | POA: Diagnosis not present

## 2017-01-23 DIAGNOSIS — R296 Repeated falls: Secondary | ICD-10-CM | POA: Diagnosis not present

## 2017-01-30 DIAGNOSIS — E1141 Type 2 diabetes mellitus with diabetic mononeuropathy: Secondary | ICD-10-CM | POA: Diagnosis not present

## 2017-01-30 DIAGNOSIS — R296 Repeated falls: Secondary | ICD-10-CM | POA: Diagnosis not present

## 2017-01-30 DIAGNOSIS — M25511 Pain in right shoulder: Secondary | ICD-10-CM | POA: Diagnosis not present

## 2017-01-30 DIAGNOSIS — M545 Low back pain: Secondary | ICD-10-CM | POA: Diagnosis not present

## 2017-01-30 DIAGNOSIS — M25512 Pain in left shoulder: Secondary | ICD-10-CM | POA: Diagnosis not present

## 2017-01-30 DIAGNOSIS — I10 Essential (primary) hypertension: Secondary | ICD-10-CM | POA: Diagnosis not present

## 2017-02-12 ENCOUNTER — Telehealth: Payer: Self-pay

## 2017-02-12 ENCOUNTER — Other Ambulatory Visit: Payer: Self-pay

## 2017-02-12 MED ORDER — DULAGLUTIDE 1.5 MG/0.5ML ~~LOC~~ SOAJ: 1.5000 mg | SUBCUTANEOUS | 1 refills | Status: DC

## 2017-02-12 NOTE — Telephone Encounter (Signed)
Goes to [employer]

## 2017-02-12 NOTE — Telephone Encounter (Signed)
Wife called to request referral for hearing specialist. Please call wife back at [phone]. Pt needs appt after lunch please

## 2017-02-12 NOTE — Telephone Encounter (Signed)
Pt's  Pharmacy called and said the pt dropped his trucity on the sink when he was giving his self a shot. Refill his trucity  X 1 refill.

## 2017-02-12 NOTE — Telephone Encounter (Signed)
pls refer to ENT or hearing clinic

## 2017-02-13 ENCOUNTER — Encounter (HOSPITAL_COMMUNITY): Payer: Medicare Other | Attending: Hematology | Admitting: Hematology

## 2017-02-13 ENCOUNTER — Encounter (HOSPITAL_COMMUNITY): Payer: Self-pay | Admitting: Hematology

## 2017-02-13 VITALS — BP 122/72 | HR 62 | Temp 98.3°F | Resp 18 | Wt 214.0 lb

## 2017-02-13 DIAGNOSIS — I2699 Other pulmonary embolism without acute cor pulmonale: Secondary | ICD-10-CM | POA: Diagnosis not present

## 2017-02-13 DIAGNOSIS — D6852 Prothrombin gene mutation: Secondary | ICD-10-CM | POA: Diagnosis not present

## 2017-02-13 NOTE — Patient Instructions (Signed)
[place] at [facility] Discharge Instructions  RECOMMENDATIONS MADE BY THE CONSULTANT AND ANY TEST RESULTS WILL BE SENT TO YOUR REFERRING PHYSICIAN.  You were seen today by Dr. Irene Limbo Follow up as needed See Amy up front for appointments   Thank you for choosing [place] [facility] to provide your oncology and hematology care.  To afford each patient quality time with our provider, please arrive at least 15 minutes before your scheduled appointment time.    If you have a lab appointment with the [facility] please come in thru the  Main Entrance and check in at the main information desk  You need to re-schedule your appointment should you arrive 10 or more minutes late.  We strive to give you quality time with our providers, and arriving late affects you and other patients whose appointments are after yours.  Also, if you no show three or more times for appointments you may be dismissed from the clinic at the providers discretion.     Again, thank you for choosing [facility].  Our hope is that these requests will decrease the amount of time that you wait before being seen by our physicians.       _____________________________________________________________  Should you have questions after your visit to [facility], please contact our office at [phone] between the hours of 8:30 a.m. and 4:30 p.m.  Voicemails left after 4:30 p.m. will not be returned until the following business day.  For prescription refill requests, have your pharmacy contact our office.       Resources For Cancer Patients and their Caregivers ? American Cancer Society: Can assist with transportation, wigs, general needs, runs Look Good Feel Better.        409 796 2817 ? Cancer Care: Provides financial assistance, online support groups, medication/co-pay assistance.  1-800-813-HOPE 813-787-7619) ? [facility] Assists [place] cancer patients and their families through emotional , educational and financial support.  731-462-6638 ? [place] DSS Where to apply for food stamps, Medicaid and utility assistance. 415-688-9868 ? [facility]: Transportation to medical appointments. (463) 368-9132 ? Social Security Administration: May apply for disability if have a Stage IV cancer. (605)163-2200 386-502-1091 ? [employer], Disability and Transit Services: Assists with nutrition, care and transit needs. [place] Support Programs: @10RELATIVEDAYS @ > Cancer Support Group  2nd Tuesday of the month 1pm-2pm, Journey Room  > Creative Journey  3rd Tuesday of the month 1130am-1pm, Journey Room  > Look Good Feel Better  1st Wednesday of the month 10am-12 noon, Journey Room (Call [place] to register [phone])

## 2017-02-13 NOTE — Telephone Encounter (Signed)
Sent referral to [facility]

## 2017-02-15 NOTE — Progress Notes (Signed)
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  Current Treatment: Lifelong anticoagulation, currently on Xarelto.    INTERVAL HISTORY: Patient is here for follow-up regarding his inherited thrombophilia. He is currently on Xarelto as per cardiology for his history of life threatening PE with prothrombin gene mutation . He reports no new events of the thromboembolism. Notes increased bruising no overt bleeding. Has had issues with worsening neuropathy from diabetes increasing his fall risk. He notes that he has fallen despite using a cane. I encouraged him to discuss pros and cons of ongoing anticoagulation with his cardiologist and primary care physician given his increased risk of falls. His lack of balance and decreased responsiveness has led to him not being able to drive his car and this frustrates him somewhat. His wife is with him for this clinic visit and is very helpful with providing information and notes that he is in no condition to drive. I recommended the patient to hold off on any driving. No other acute new symptoms.  REVIEW OF SYSTEMS:    10 Point review of systems of done and is negative except as noted above.  . Past Medical History:  Diagnosis Date  . Agitation   . Arthritis   . Cataract   . Chronic back pain   . Confusion   . Diabetes ([place])    type 2  . DVT (deep venous thrombosis) ([place]) 01/2015  . Hearing impaired   . High cholesterol   . Hypertension   . Insomnia   . Insomnia   . Leg pain, diffuse   . Leg swelling   . Memory loss   . Mild cognitive impairment    sees [facility]  . Numbness    fingers, feet, toes  . OSA on CPAP   . 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 06/14/2015  . Pulmonary embolism ([place]) 01/2015  . Spinal stenosis    lumbar  . Tremor    on propranolol  . Wears glasses     . Past Surgical History:  Procedure Laterality Date  . CATARACT EXTRACTION    . COLONOSCOPY  2014  . TONSILLECTOMY      . Social History  Substance Use Topics  . Smoking status: Former Smoker    Packs/day: 1.00    Years: 10.00    Types: Cigarettes    Quit date: 11/25/2004  . Smokeless tobacco: Never Used  . Alcohol use 0.0 oz/week     Comment: one every two months    ALLERGIES:  is allergic to bee venom; ultram [tramadol hcl]; oxycodone; and oxycontin [oxycodone hcl].  MEDICATIONS:  Current Outpatient Prescriptions  Medication Sig Dispense Refill  . budesonide-formoterol (SYMBICORT) 160-4.5 MCG/ACT inhaler Inhale 2 puffs into the lungs 2 (two) times daily. 1 Inhaler 3  . diltiazem (CARDIZEM) 30 MG tablet Take 1 tablet (30 mg total) by mouth 2 (two) times daily. 60 tablet 11  . donepezil (ARICEPT) 10 MG tablet Take 1 tablet (10 mg total) by mouth at bedtime. 30 tablet 6  . Dulaglutide (TRULICITY) 1.5 FT/7.3UK SOPN Inject 1.5 mg into the skin once a week. 2 mL 1  . flecainide (TAMBOCOR) 150 MG tablet TAKE 1/2 TABLET BY MOUTH TWICE DAILY 90 tablet 3  .  iron polysaccharides (NIFEREX) 150 MG capsule Take 1 capsule (150 mg total) by mouth daily. 90 capsule 3  . memantine (NAMENDA) 5 MG tablet Take 1 tablet (5 mg total) by mouth 2 (two) times daily. 1 po daily for 2 weeks then increase to 1 twice daily 60 tablet 6  . metFORMIN (GLUCOPHAGE) 850 MG tablet Take 1 tablet (850 mg total) by mouth 2 (two) times daily with a meal. 180 tablet 0  . Misc Natural Products (OSTEO BI-FLEX ADV JOINT SHIELD PO) Take 1 tablet by mouth daily.     . rivaroxaban (XARELTO) 20 MG TABS tablet Take 1 tablet (20 mg total) by mouth daily with supper. 90 tablet 3  . tamsulosin (FLOMAX) 0.4 MG CAPS capsule TAKE 1 CAPSULE(0.4 MG) BY MOUTH DAILY 90 capsule 1   No current  facility-administered medications for this visit.     PHYSICAL EXAMINATION: ECOG PERFORMANCE STATUS: 2-3  . Vitals:   02/13/17 1542  BP: 122/72  Pulse: 62  Resp: 18  Temp: 98.3 F (36.8 C)    Filed Weights   02/13/17 1542  Weight: 214 lb (97.1 kg)   .Body mass index is 29.85 kg/m.  GENERAL:alert, in no acute distress and comfortable SKIN: no acute rashes, no significant lesions EYES: conjunctiva are pink and non-injected, sclera anicteric OROPHARYNX: MMM, no exudates, no oropharyngeal erythema or ulceration NECK: supple, no JVD LYMPH:  no palpable lymphadenopathy in the cervical, axillary or inguinal regions LUNGS: clear to auscultation b/l with normal respiratory effort HEART: regular rate & rhythm ABDOMEN:  normoactive bowel sounds , non tender, not distended. Extremity: b/l 1+ pedal edema PSYCH: alert & oriented x 3 with fluent speech NEURO: no focal motor/sensory deficits  LABORATORY DATA:   I have reviewed the data as listed  . CBC Latest Ref Rng & Units 10/27/2016 04/01/2016 12/08/2015  WBC 4.0 - 10.5 K/uL 6.2 7.8 6.0  Hemoglobin 13.0 - 17.0 g/dL 14.4 15.6 14.9  Hematocrit 39.0 - 52.0 % 43.8 46.1 45.3  Platelets 150 - 400 K/uL 204 323 296    . CMP Latest Ref Rng & Units 10/27/2016 04/01/2016 12/08/2015  Glucose 65 - 99 mg/dL 225(H) 192(H) 173(H)  BUN 6 - 20 mg/dL 16 17 13   Creatinine 0.61 - 1.24 mg/dL 0.87 0.83 0.69(L)  Sodium 135 - 145 mmol/L 137 136 138  Potassium 3.5 - 5.1 mmol/L 3.8 4.4 4.8  Chloride 101 - 111 mmol/L 105 101 100  CO2 22 - 32 mmol/L 22 25 26   Calcium 8.9 - 10.3 mg/dL 9.0 9.6 9.4  Total Protein 6.5 - 8.1 g/dL 7.0 6.6 -  Total Bilirubin 0.3 - 1.2 mg/dL 0.6 0.4 -  Alkaline Phos 38 - 126 U/L 74 80 -  AST 15 - 41 U/L 30 19 -  ALT 17 - 63 U/L 18 14 -     RADIOGRAPHIC STUDIES: I have personally reviewed the radiological images as listed and agreed with the findings in the report. No results found.  ASSESSMENT & PLAN:   Prothrombin  G20210A mutation, heterozygous, 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 Pulmonary embolism with bilateral DVT on 01/31/2015 with CTA chest demonstrating large volume PE with evidence of R heart strain.   Echo on 02/01/2015 illustrated an EF of 45- 19%, grade 1 diastolic dysfunction, moderate RV dilation, and PAP at 52 mm Hg.  He was treated with Heparin and transition to Xarelto.  Hypercoag panel demonstrates a heterozygosity for G-20210-A mutation (Prothrombin gene mutation). Resolution of DVTs noted  on [place] on 04/18/2015.  2 D echo on 06/06/2015 demonstrates an improvement in LVEF.  In December 2016, Xarelto was discontinued and changed to Vitamin K antagonist by cardiology due to patient/wife reporting "side effects" of Xarelto. He is now back on Xarelto. Plan -No new evidence of venous thromboembolism. -He continues to be on Xarelto. -Fall precautions given his neuropathy. He has stopped driving and uses a cane for proper prevention. Patient was recommended to follow-up with his primary care physician. If that is significant fall risk despite cane use might need to use a walker. -Continue physical therapy. -If there is a significant fall risk despite appropriate use of assistive devices might need to consider dialing down the anticoagulation to prophylactic dose of Xarelto. -We'll discharge the patient to his primary care physician's care.  Kindly reconsult [place] if any new questions or concerns arise.  I spent 15 minutes counseling the patient face to face. The total time spent in the appointment was 20 minutes and more than 50% was on counseling and direct patient cares.    Sullivan Lone MD [place] A[facility]IV[facility] Hematology/Oncology Physician [facility]  (Office):       [phone] (Work cell):  [phone] (Fax):           [phone]

## 2017-02-17 ENCOUNTER — Telehealth: Payer: Self-pay | Admitting: Neurology

## 2017-02-17 MED ORDER — MEMANTINE HCL 10 MG PO TABS: 10.0000 mg | ORAL_TABLET | Freq: Two times a day (BID) | ORAL | 6 refills | Status: DC

## 2017-02-17 NOTE — Telephone Encounter (Signed)
Telephone call to wife may now increase Namenda to 10mg  BID will call in new RX. Uses a current prescription of 5 mg tablets so he would take (2) 5 mg tablets twice a day. She verbalizes understanding

## 2017-02-17 NOTE — Telephone Encounter (Signed)
Wife called Friday Pm and asked why namenda was reduced form 10 mg daily divided bid  To 5 mg daily.  I asked her to go on to 10 mg at night and 5 mg in AM as this is usual titration. He will have only 10 days worth of 5 MG Namenda. Please call and correct today. CD

## 2017-02-25 ENCOUNTER — Other Ambulatory Visit: Payer: Self-pay

## 2017-02-25 DIAGNOSIS — H9193 Unspecified hearing loss, bilateral: Secondary | ICD-10-CM

## 2017-02-28 ENCOUNTER — Other Ambulatory Visit: Payer: Self-pay | Admitting: Medical

## 2017-03-11 ENCOUNTER — Other Ambulatory Visit: Payer: Self-pay | Admitting: Medical

## 2017-04-02 ENCOUNTER — Other Ambulatory Visit: Payer: Self-pay | Admitting: Medical

## 2017-04-05 ENCOUNTER — Other Ambulatory Visit: Payer: Self-pay | Admitting: Medical

## 2017-04-10 ENCOUNTER — Encounter: Payer: Self-pay | Admitting: Internal Medicine

## 2017-04-10 ENCOUNTER — Ambulatory Visit (INDEPENDENT_AMBULATORY_CARE_PROVIDER_SITE_OTHER): Payer: Medicare Other | Admitting: Internal Medicine

## 2017-04-10 VITALS — BP 136/74 | HR 77 | Ht 70.0 in | Wt 223.0 lb

## 2017-04-10 DIAGNOSIS — I493 Ventricular premature depolarization: Secondary | ICD-10-CM

## 2017-04-10 DIAGNOSIS — I10 Essential (primary) hypertension: Secondary | ICD-10-CM | POA: Diagnosis not present

## 2017-04-10 DIAGNOSIS — R002 Palpitations: Secondary | ICD-10-CM | POA: Diagnosis not present
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
## 2017-04-10 NOTE — Patient Instructions (Signed)

## 2017-04-24 ENCOUNTER — Other Ambulatory Visit: Payer: Self-pay | Admitting: Internal Medicine

## 2017-04-24 ENCOUNTER — Other Ambulatory Visit: Payer: Self-pay

## 2017-05-08 ENCOUNTER — Ambulatory Visit: Payer: Medicare Other | Admitting: Medical

## 2017-05-09 ENCOUNTER — Other Ambulatory Visit: Payer: Self-pay | Admitting: Medical

## 2017-05-13 ENCOUNTER — Ambulatory Visit: Payer: Medicare Other | Admitting: Medical

## 2017-05-14 ENCOUNTER — Ambulatory Visit: Payer: Medicare Other | Admitting: Medical

## 2017-05-29 ENCOUNTER — Other Ambulatory Visit: Payer: Self-pay | Admitting: Medical

## 2017-06-03 ENCOUNTER — Ambulatory Visit (INDEPENDENT_AMBULATORY_CARE_PROVIDER_SITE_OTHER): Payer: Medicare Other | Admitting: Nurse Practitioner

## 2017-06-03 ENCOUNTER — Encounter: Payer: Self-pay | Admitting: Nurse Practitioner

## 2017-06-03 VITALS — BP 124/75 | HR 70 | Wt 218.0 lb

## 2017-06-03 DIAGNOSIS — R29898 Other symptoms and signs involving the musculoskeletal system: Secondary | ICD-10-CM

## 2017-06-03 DIAGNOSIS — R5381 Other malaise: Secondary | ICD-10-CM | POA: Diagnosis not present

## 2017-06-03 DIAGNOSIS — G3184 Mild cognitive impairment, so stated: Secondary | ICD-10-CM | POA: Diagnosis not present

## 2017-06-03 DIAGNOSIS — R269 Unspecified abnormalities of gait and mobility: Secondary | ICD-10-CM | POA: Diagnosis not present

## 2017-06-03 DIAGNOSIS — E0841 Diabetes mellitus due to underlying condition with diabetic mononeuropathy: Secondary | ICD-10-CM

## 2017-06-03 MED ORDER — MEMANTINE HCL 10 MG PO TABS: 10.0000 mg | ORAL_TABLET | Freq: Two times a day (BID) | ORAL | 6 refills | Status: DC

## 2017-06-03 MED ORDER — DONEPEZIL HCL 10 MG PO TABS: 10.0000 mg | ORAL_TABLET | Freq: Every day | ORAL | 6 refills | Status: DC
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
## 2017-06-03 NOTE — Patient Instructions (Signed)
Continue Aricept at current dose will refill  Namenda 10mg   1 twice daily Can order therapy  in [place]  Be careful with ambulation at risk for falls F/U in 6 months with Dr. [name]

## 2017-06-05 ENCOUNTER — Telehealth: Payer: Self-pay | Admitting: Medical

## 2017-06-05 ENCOUNTER — Other Ambulatory Visit: Payer: Self-pay | Admitting: Medical

## 2017-06-05 NOTE — Telephone Encounter (Signed)
He is due for f/u appt.

## 2017-06-05 NOTE — Telephone Encounter (Signed)
Pt called and states that he needs a referral to go the the audiologist in [place] they are hearing life and they are on  [address], [place], [place] [address] they can be reached at [phone] he can be reached at [phone]

## 2017-06-06 ENCOUNTER — Telehealth: Payer: Self-pay | Admitting: Medical

## 2017-06-06 NOTE — Telephone Encounter (Addendum)
error 

## 2017-06-06 NOTE — Telephone Encounter (Signed)
Called and left message on VM to make a appt

## 2017-06-09 ENCOUNTER — Ambulatory Visit (INDEPENDENT_AMBULATORY_CARE_PROVIDER_SITE_OTHER): Payer: Medicare Other | Admitting: Medical

## 2017-06-09 ENCOUNTER — Encounter: Payer: Self-pay | Admitting: Medical

## 2017-06-09 ENCOUNTER — Telehealth: Payer: Self-pay

## 2017-06-09 VITALS — BP 132/74 | HR 74 | Wt 222.0 lb

## 2017-06-09 DIAGNOSIS — E785 Hyperlipidemia, unspecified: Secondary | ICD-10-CM

## 2017-06-09 DIAGNOSIS — H9313 Tinnitus, bilateral: Secondary | ICD-10-CM | POA: Diagnosis not present

## 2017-06-09 DIAGNOSIS — H6121 Impacted cerumen, right ear: Secondary | ICD-10-CM | POA: Diagnosis not present

## 2017-06-09 DIAGNOSIS — I10 Essential (primary) hypertension: Secondary | ICD-10-CM | POA: Diagnosis not present

## 2017-06-09 DIAGNOSIS — H9190 Unspecified hearing loss, unspecified ear: Secondary | ICD-10-CM

## 2017-06-09 DIAGNOSIS — Z974 Presence of external hearing-aid: Secondary | ICD-10-CM | POA: Insufficient documentation

## 2017-06-09 DIAGNOSIS — H6123 Impacted cerumen, bilateral: Secondary | ICD-10-CM | POA: Insufficient documentation

## 2017-06-09 DIAGNOSIS — E118 Type 2 diabetes mellitus with unspecified complications: Secondary | ICD-10-CM

## 2017-06-09 LAB — COMPREHENSIVE METABOLIC PANEL
ALBUMIN: 3.9 g/dL (ref 3.6–5.1)
ALK PHOS: 85 U/L (ref 40–115)
ALT: 29 U/L (ref 9–46)
AST: 20 U/L (ref 10–35)
BILIRUBIN TOTAL: 0.4 mg/dL (ref 0.2–1.2)
BUN: 15 mg/dL (ref 7–25)
CALCIUM: 9.2 mg/dL (ref 8.6–10.3)
CO2: 23 mmol/L (ref 20–31)
CREATININE: 0.78 mg/dL (ref 0.70–1.18)
Chloride: 104 mmol/L (ref 98–110)
GLUCOSE: 157 mg/dL — AB (ref 65–99)
Potassium: 4.4 mmol/L (ref 3.5–5.3)
SODIUM: 137 mmol/L (ref 135–146)
Total Protein: 6.4 g/dL (ref 6.1–8.1)

## 2017-06-09 LAB — CBC
HCT: 43.6 % (ref 38.5–50.0)
Hemoglobin: 14.5 g/dL (ref 13.2–17.1)
MCH: 30.3 pg (ref 27.0–33.0)
MCHC: 33.3 g/dL (ref 32.0–36.0)
MCV: 91.2 fL (ref 80.0–100.0)
MPV: 10.7 fL (ref 7.5–12.5)
PLATELETS: 283 10*3/uL (ref 140–400)
RBC: 4.78 MIL/uL (ref 4.20–5.80)
RDW: 12.2 % (ref 11.0–15.0)
WBC: 6.2 10*3/uL (ref 4.0–10.5)

## 2017-06-09 NOTE — Telephone Encounter (Signed)
I am not sure if audiology office removes wax or not.   He was advised to call audiologist to let them know he does have impacted wax in right ear, and we couldn't get it all out.  If they need to postpone his appt or have him see ENT first then that is what I would advise.  If audiology office removes wax, then he can keep his appt

## 2017-06-09 NOTE — Progress Notes (Signed)
Subjective: Chief Complaint  Patient presents with  . hear loss    hear loss ringing in ears bloods sugar.   Here today by himself.  Wife in lobby.  Mainly here for ear issue, but has questinos about his diabetes.  Needs referral.  Lost his right hearing aid.   Needs to see audiologist for new hearing aid.  Has some ongoing ringing and loss in right ear.  Wonders about heavy wax in right ear.  Wants to get new clip on type hearing aids. W ants to see Dr. Zenia Resides, audiologist in [place], [place]  Diabetes - wants to discuss his diabetes today.  Sugars running high lately, 150-210.  He is taking metformin BID, Trulicity weekly.  Doesn't think its working as well.   Eats whatever he can get.  Does eat out.  He says wife doesn't cook much and when she does cooks sweets and bread.  He doesn't feel he has healthy foods at home.  He can't just go out and get what he wants or what he needs.  No other diabetes concerns.  No other aggravating or relieving factors. No other complaint.  Past Medical History:  Diagnosis Date  . Agitation   . Arthritis   . Cataract   . Chronic back pain   . Confusion   . Diabetes ([place])    type 2  . DVT (deep venous thrombosis) ([place]) 01/2015  . Hearing impaired   . High cholesterol   . Hypertension   . Insomnia   . Insomnia   . Leg pain, diffuse   . Leg swelling   . Memory loss   . Mild cognitive impairment    sees [facility] Neurology  . Numbness    fingers, feet, toes  . OSA on CPAP   . 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 06/14/2015  . Pulmonary embolism ([place]) 01/2015  . Spinal stenosis    lumbar  . Tremor    on propranolol  . Wears glasses    Current Outpatient Prescriptions on File Prior to Visit  Medication Sig Dispense Refill  . budesonide-formoterol (SYMBICORT) 160-4.5 MCG/ACT inhaler Inhale 2 puffs into the lungs 2 (two) times daily. 1 Inhaler 3  . diltiazem (CARDIZEM) 30 MG tablet TAKE 1 TABLET(30 MG)  BY MOUTH TWICE DAILY 60 tablet 6  . donepezil (ARICEPT) 10 MG tablet Take 1 tablet (10 mg total) by mouth at bedtime. 30 tablet 6  . flecainide (TAMBOCOR) 150 MG tablet TAKE 1/2 TABLET BY MOUTH TWICE DAILY 90 tablet 3  . iron polysaccharides (NIFEREX) 150 MG capsule Take 1 capsule (150 mg total) by mouth daily. 90 capsule 3  . memantine (NAMENDA) 10 MG tablet Take 1 tablet (10 mg total) by mouth 2 (two) times daily. 60 tablet 6  . metFORMIN (GLUCOPHAGE) 850 MG tablet TAKE 1 TABLET BY MOUTH TWICE DAILY WITH A MEAL 180 tablet 0  . Misc Natural Products (OSTEO BI-FLEX ADV JOINT SHIELD PO) Take 1 tablet by mouth daily.     . rivaroxaban (XARELTO) 20 MG TABS tablet Take 1 tablet (20 mg total) by mouth daily with supper. 90 tablet 3  . tamsulosin (FLOMAX) 0.4 MG CAPS capsule TAKE 1 CAPSULE(0.4 MG) BY MOUTH DAILY 90 capsule 0  . TRUE METRIX BLOOD GLUCOSE TEST test strip USE AS INSTRUCTED 062 each 1  . TRULICITY 1.5 IR/4.8NI SOPN INJECT 1.5 MG UNDER THE SKIN ONCE A WEEK 2 mL 0   No current facility-administered medications  on file prior to visit.     ROS as in subjective   Objective: BP 132/74   Pulse 74   Wt 222 lb (100.7 kg)   SpO2 96%   BMI 31.85 kg/m   General appearance: alert, no distress, WD/WN Impacted cerumen right ear, left TM normal HEENT: normocephalic, sclerae anicteric, nares patent, no discharge or erythema, pharynx normal Otherwise not examined   Assessment: Encounter Diagnoses  Name Primary?  . Type 2 diabetes mellitus with complication, without long-term current use of insulin ([place]) Yes  . Essential hypertension   . Hyperlipidemia, unspecified hyperlipidemia type   . Impacted cerumen of right ear   . Hearing loss, unspecified hearing loss type, unspecified laterality   . Wears hearing aid   . Tinnitus of both ears      Plan Hearing loss, tinnitus - referral back to audiology due to symptoms and lost hearing aid  Impacted cerumen -  Discussed findings.   Discussed risk/benefits of procedure and patient agrees to procedure. used warm water lavage to remove impacted cerumen from right ear canal. Patient tolerated procedure well. Advised they avoid using any cotton swabs or other devices to clean the ear canals.  Discussed home remedies to get rest of wax to dissolve as we were about 70% successful and removing cerumen.  Use basic hygiene as discussed.  Follow up prn.   Diabetes - labs today.   Will likely need to make some medication adjustments.  counseled on diet, exercise, foot checks, f/u.  HTN - c/t same medications.    hyperlipidemia - c/t same medication   Christian [name] was seen today for hear loss.  Diagnoses and all orders for this visit:  Type 2 diabetes mellitus with complication, without long-term current use of insulin (HCC) -     Comprehensive metabolic panel -     CBC -     Hemoglobin A1c -     Microalbumin / creatinine urine ratio  Essential hypertension -     Comprehensive metabolic panel  Hyperlipidemia, unspecified hyperlipidemia type  Impacted cerumen of right ear  Hearing loss, unspecified hearing loss type, unspecified laterality  Wears hearing aid  Tinnitus of both ears

## 2017-06-09 NOTE — Telephone Encounter (Signed)
Pt's wife informed me at check out that you was not able to get the wax out of pt's Rt ear, and that he has hearing test for hearing aids on 06/11/2017 and that she thought the lady could get the wax out. Advised wife that audiologist may not be able to get the wax out and to call the audiologist to clarify this first. If audiologist can not get wax out, I advised wife to call Dr. Wilburn Cornelia to schedule appt for wax removal. Please let me know if this is appropriate or not. Victorino December

## 2017-06-10 ENCOUNTER — Other Ambulatory Visit: Payer: Self-pay | Admitting: Medical

## 2017-06-10 LAB — HEMOGLOBIN A1C
HEMOGLOBIN A1C: 9.5 % — AB (ref <5.7)
Mean Plasma Glucose: 226 mg/dL

## 2017-06-10 LAB — MICROALBUMIN / CREATININE URINE RATIO
Creatinine, Urine: 198 mg/dL (ref 20–370)
MICROALB UR: 1.9 mg/dL
Microalb Creat Ratio: 10 mcg/mg creat (ref <30)

## 2017-06-10 MED ORDER — INSULIN DEGLUDEC 100 UNIT/ML ~~LOC~~ SOPN: 10.0000 [IU] | PEN_INJECTOR | Freq: Every day | SUBCUTANEOUS | 2 refills | Status: DC

## 2017-06-10 MED ORDER — METFORMIN HCL 850 MG PO TABS: ORAL_TABLET | ORAL | 0 refills | Status: DC

## 2017-06-10 MED ORDER — EMPAGLIFLOZIN 10 MG PO TABS: 10.0000 mg | ORAL_TABLET | Freq: Every day | ORAL | 2 refills | Status: DC

## 2017-06-10 NOTE — Progress Notes (Signed)
med

## 2017-06-11 ENCOUNTER — Other Ambulatory Visit (HOSPITAL_COMMUNITY): Payer: Self-pay | Admitting: Otolaryngology

## 2017-06-11 DIAGNOSIS — H903 Sensorineural hearing loss, bilateral: Secondary | ICD-10-CM

## 2017-06-11 DIAGNOSIS — H6061 Unspecified chronic otitis externa, right ear: Secondary | ICD-10-CM

## 2017-06-11 DIAGNOSIS — H6121 Impacted cerumen, right ear: Secondary | ICD-10-CM | POA: Diagnosis not present

## 2017-06-16 ENCOUNTER — Ambulatory Visit: Payer: Medicare Other | Admitting: Physical Therapy

## 2017-06-19 ENCOUNTER — Ambulatory Visit (HOSPITAL_COMMUNITY)
Admission: RE | Admit: 2017-06-19 | Discharge: 2017-06-19 | Disposition: A | Discharge status: Home / Self Care | Payer: Medicare Other | Source: Ambulatory Visit | Attending: Otolaryngology | Admitting: Otolaryngology

## 2017-06-19 DIAGNOSIS — H6121 Impacted cerumen, right ear: Secondary | ICD-10-CM | POA: Insufficient documentation

## 2017-06-19 DIAGNOSIS — H6061 Unspecified chronic otitis externa, right ear: Secondary | ICD-10-CM | POA: Insufficient documentation

## 2017-06-19 DIAGNOSIS — H903 Sensorineural hearing loss, bilateral: Secondary | ICD-10-CM

## 2017-06-19 DIAGNOSIS — H902 Conductive hearing loss, unspecified: Secondary | ICD-10-CM | POA: Diagnosis not present

## 2017-06-19 MED ORDER — GADOBENATE DIMEGLUMINE 529 MG/ML IV SOLN
20.0000 mL | Freq: Once | INTRAVENOUS | Status: AC | PRN
  Administered 2017-06-19: 20 mL via INTRAVENOUS

## 2017-06-29 ENCOUNTER — Other Ambulatory Visit: Payer: Self-pay | Admitting: Internal Medicine

## 2017-06-29 ENCOUNTER — Other Ambulatory Visit: Payer: Self-pay | Admitting: Medical

## 2017-06-29 DIAGNOSIS — I493 Ventricular premature depolarization: Secondary | ICD-10-CM

## 2017-07-04 ENCOUNTER — Telehealth: Payer: Self-pay

## 2017-07-04 MED ORDER — INSULIN DEGLUDEC 100 UNIT/ML ~~LOC~~ SOPN: 10.0000 [IU] | PEN_INJECTOR | Freq: Every day | SUBCUTANEOUS | 2 refills | Status: DC

## 2017-07-04 NOTE — Telephone Encounter (Signed)
Pt's wife called to let us know that Christian [name] was called into the wrong pharmacy. This was called into [facility] per wife request. Christian [name]

## 2017-07-07 ENCOUNTER — Telehealth: Payer: Self-pay | Admitting: Medical

## 2017-07-07 ENCOUNTER — Other Ambulatory Visit: Payer: Self-pay

## 2017-07-07 MED ORDER — INSULIN DEGLUDEC 100 UNIT/ML ~~LOC~~ SOPN: 10.0000 [IU] | PEN_INJECTOR | Freq: Every day | SUBCUTANEOUS | 2 refills | Status: DC

## 2017-07-07 MED ORDER — EMPAGLIFLOZIN 10 MG PO TABS: 10.0000 mg | ORAL_TABLET | Freq: Every day | ORAL | 2 refills | Status: DC

## 2017-07-07 NOTE — Telephone Encounter (Signed)
Pt called and stated that refills for Jardiance should have gone to [facility] in [place] not [place]. Please change that. ALSO  Pt needs needles for [place] flextouch pen. Those [facility] [place] [facility] [place].

## 2017-07-07 NOTE — Telephone Encounter (Signed)
Sent both meds to [facility]

## 2017-07-09 ENCOUNTER — Other Ambulatory Visit: Payer: Self-pay

## 2017-07-09 MED ORDER — INSULIN PEN NEEDLE 32G X 4 MM MISC: 1.0000 | Freq: Every day | 3 refills | Status: DC

## 2017-07-14 NOTE — Progress Notes (Signed)
I reviewed note and agree with plan.   Raden Byington R. Alexandr Oehler, MD  Certified in Neurology, Neurophysiology and Neuroimaging  [facility] [address], [address] [place],  [address] [phone]   

## 2017-07-31 ENCOUNTER — Other Ambulatory Visit: Payer: Self-pay | Admitting: Nurse Practitioner

## 2017-08-20 ENCOUNTER — Ambulatory Visit: Payer: Medicare Other | Admitting: Medical

## 2017-08-21 ENCOUNTER — Ambulatory Visit (INDEPENDENT_AMBULATORY_CARE_PROVIDER_SITE_OTHER): Payer: Medicare Other | Admitting: Medical

## 2017-08-21 VITALS — BP 130/74 | HR 76 | Wt 210.6 lb

## 2017-08-21 DIAGNOSIS — Z23 Encounter for immunization: Secondary | ICD-10-CM

## 2017-08-21 DIAGNOSIS — E1165 Type 2 diabetes mellitus with hyperglycemia: Secondary | ICD-10-CM | POA: Diagnosis not present

## 2017-08-21 DIAGNOSIS — Z794 Long term (current) use of insulin: Secondary | ICD-10-CM

## 2017-08-21 DIAGNOSIS — E785 Hyperlipidemia, unspecified: Secondary | ICD-10-CM | POA: Diagnosis not present

## 2017-08-21 DIAGNOSIS — R32 Unspecified urinary incontinence: Secondary | ICD-10-CM | POA: Diagnosis not present

## 2017-08-21 DIAGNOSIS — E1169 Type 2 diabetes mellitus with other specified complication: Secondary | ICD-10-CM

## 2017-08-21 DIAGNOSIS — Z7189 Other specified counseling: Secondary | ICD-10-CM | POA: Diagnosis not present

## 2017-08-21 DIAGNOSIS — I10 Essential (primary) hypertension: Secondary | ICD-10-CM | POA: Diagnosis not present

## 2017-08-21 DIAGNOSIS — IMO0002 Reserved for concepts with insufficient information to code with codable children: Secondary | ICD-10-CM

## 2017-08-21 DIAGNOSIS — Z7185 Encounter for immunization safety counseling: Secondary | ICD-10-CM

## 2017-08-21 NOTE — Progress Notes (Signed)
Subjective: Chief Complaint  Patient presents with  . Urinary Incontinence    incontinene   Here with wife for diabetes f/u.  Accompanied by wife.   No particular c/o.   After discussing diet, he is using no diet discretion, eating pie regularly, chips and other junk food.   He is compliant with medications.  He did start [place] last visit and stopped Trulicity.  Still takes metformin.  He has been having incontinence with urine, but not bowels.    Past Medical History:  Diagnosis Date  . Agitation   . Arthritis   . Cataract   . Chronic back pain   . Confusion   . Diabetes ([place])    type 2  . DVT (deep venous thrombosis) ([place]) 01/2015  . Hearing impaired   . High cholesterol   . Hypertension   . Insomnia   . Insomnia   . Leg pain, diffuse   . Leg swelling   . Memory loss   . Mild cognitive impairment    sees [facility]  . Numbness    fingers, feet, toes  . OSA on CPAP   . 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 06/14/2015  . Pulmonary embolism ([place]) 01/2015  . Spinal stenosis    lumbar  . Tremor    on propranolol  . Wears glasses    Current Outpatient Prescriptions on File Prior to Visit  Medication Sig Dispense Refill  . budesonide-formoterol (SYMBICORT) 160-4.5 MCG/ACT inhaler Inhale 2 puffs into the lungs 2 (two) times daily. 1 Inhaler 3  . diltiazem (CARDIZEM) 30 MG tablet TAKE 1 TABLET(30 MG) BY MOUTH TWICE DAILY 60 tablet 6  . donepezil (ARICEPT) 10 MG tablet Take 1 tablet (10 mg total) by mouth at bedtime. 30 tablet 6  . empagliflozin (JARDIANCE) 10 MG TABS tablet Take 10 mg by mouth daily. 30 tablet 2  . flecainide (TAMBOCOR) 150 MG tablet TAKE 1/2 TABLET BY MOUTH TWICE DAILY 90 tablet 3  . flecainide (TAMBOCOR) 150 MG tablet TAKE 1/2 TABLET BY MOUTH TWICE DAILY 90 tablet 2  . insulin degludec (TRESIBA) 100 UNIT/ML SOPN FlexTouch Pen Inject 0.1 mLs (10 Units total) into the skin daily at 10 pm.  3 mL 2  . Insulin Pen Needle (BD PEN NEEDLE NANO U/F) 32G X 4 MM MISC 1 each by Does not apply route daily. DX E11.9 100 each 3  . iron polysaccharides (NIFEREX) 150 MG capsule Take 1 capsule (150 mg total) by mouth daily. 90 capsule 3  . memantine (NAMENDA) 10 MG tablet Take 1 tablet (10 mg total) by mouth 2 (two) times daily. 60 tablet 6  . metFORMIN (GLUCOPHAGE) 850 MG tablet TAKE 1 TABLET BY MOUTH TWICE DAILY WITH A MEAL 180 tablet 0  . metFORMIN (GLUCOPHAGE) 850 MG tablet TAKE 1 TABLET BY MOUTH TWICE DAILY WITH MEALS 180 tablet 0  . Misc Natural Products (OSTEO BI-FLEX ADV JOINT SHIELD PO) Take 1 tablet by mouth daily.     . rivaroxaban (XARELTO) 20 MG TABS tablet Take 1 tablet (20 mg total) by mouth daily with supper. 90 tablet 3  . tamsulosin (FLOMAX) 0.4 MG CAPS capsule TAKE ONE CAPSULE BY MOUTH DAILY 90 capsule 0  . TRUE METRIX BLOOD GLUCOSE TEST test strip USE AS INSTRUCTED 100 each 1   No current facility-administered medications on file prior to visit.    ROS as in subjective   Objective: BP 130/74   Pulse 76  Wt 210 lb 9.6 oz (95.5 kg)   SpO2 95%   BMI 30.22 kg/m   General appearance: alert, no distress, WD/WN In good spirits, pleasant  Neck: supple, no lymphadenopathy, no thyromegaly, no masses Heart: RRR, normal S1, S2, no murmurs Lungs: CTA bilaterally, no wheezes, rhonchi, or rales Abdomen: +bs, soft, non tender, non distended, no masses, no hepatomegaly, no splenomegaly Pulses: 1+ symmetric, upper and lower extremities, normal cap refill Ext: no edema    Assessment: Encounter Diagnoses  Name Primary?  . Essential hypertension Yes  . Uncontrolled type 2 diabetes mellitus with other specified complication, with long-term current use of insulin ([place])   . Hyperlipidemia, unspecified hyperlipidemia type   . Vaccine counseling   . Need for influenza vaccination   . Need for pneumococcal vaccination   . Urinary incontinence, unspecified type     Plan: HTN -  c/t same medications, labs today  Diabetes - c/t daily foot checks, yearly eye exam.  Labs today.   Interestingly he was more candid about his diet today, and he apparently uses very little diet discretion in light of diabetes.  Eats pie, chips, high sugar foods regularly.    Hyperlipidemia - labs today  Advised he check insurance for coverage for Shingrix and Tdap vaccine.  Counseled on the influenza virus vaccine.  Vaccine information sheet given.   High dose Influenza vaccine given after consent obtained.  Counseled on the pneumococcal vaccine.  Vaccine information sheet given.  Pneumococcal vaccine Prevnar 13 given after consent obtained.  Christian [name] was seen today for urinary incontinence.  Diagnoses and all orders for this visit:  Essential hypertension -     Comprehensive metabolic panel -     CBC  Uncontrolled type 2 diabetes mellitus with other specified complication, with long-term current use of insulin (HCC) -     Comprehensive metabolic panel -     CBC -     Hemoglobin A1c  Hyperlipidemia, unspecified hyperlipidemia type -     Comprehensive metabolic panel -     CBC -     Lipid panel  Vaccine counseling  Need for influenza vaccination -     Flu vaccine HIGH DOSE PF (Fluzone High Dose)  Need for pneumococcal vaccination -     Pneumococcal conjugate vaccine 13-valent  Urinary incontinence, unspecified type

## 2017-08-22 ENCOUNTER — Other Ambulatory Visit: Payer: Self-pay | Admitting: Medical

## 2017-08-22 LAB — COMPREHENSIVE METABOLIC PANEL
AG RATIO: 1.5 (calc) (ref 1.0–2.5)
ALBUMIN MSPROF: 3.8 g/dL (ref 3.6–5.1)
ALT: 27 U/L (ref 9–46)
AST: 21 U/L (ref 10–35)
Alkaline phosphatase (APISO): 76 U/L (ref 40–115)
BILIRUBIN TOTAL: 0.4 mg/dL (ref 0.2–1.2)
BUN: 15 mg/dL (ref 7–25)
CALCIUM: 9 mg/dL (ref 8.6–10.3)
CHLORIDE: 108 mmol/L (ref 98–110)
CO2: 19 mmol/L — AB (ref 20–32)
Creat: 0.75 mg/dL (ref 0.70–1.18)
GLOBULIN: 2.5 g/dL (ref 1.9–3.7)
Glucose, Bld: 136 mg/dL — ABNORMAL HIGH (ref 65–99)
POTASSIUM: 4.4 mmol/L (ref 3.5–5.3)
SODIUM: 140 mmol/L (ref 135–146)
TOTAL PROTEIN: 6.3 g/dL (ref 6.1–8.1)

## 2017-08-22 LAB — HEMOGLOBIN A1C
HEMOGLOBIN A1C: 8.3 %{Hb} — AB (ref <5.7)
MEAN PLASMA GLUCOSE: 192 (calc)
eAG (mmol/L): 10.6 (calc)

## 2017-08-22 LAB — CBC
HCT: 45.8 % (ref 38.5–50.0)
Hemoglobin: 15.3 g/dL (ref 13.2–17.1)
MCH: 29.7 pg (ref 27.0–33.0)
MCHC: 33.4 g/dL (ref 32.0–36.0)
MCV: 88.9 fL (ref 80.0–100.0)
MPV: 12 fL (ref 7.5–12.5)
Platelets: 268 10*3/uL (ref 140–400)
RBC: 5.15 10*6/uL (ref 4.20–5.80)
RDW: 11.2 % (ref 11.0–15.0)
WBC: 7.1 10*3/uL (ref 3.8–10.8)

## 2017-08-22 LAB — LIPID PANEL
Cholesterol: 215 mg/dL — ABNORMAL HIGH (ref <200)
HDL: 37 mg/dL — ABNORMAL LOW (ref >40)
LDL CHOLESTEROL (CALC): 147 mg/dL — AB
Non-HDL Cholesterol (Calc): 178 mg/dL (calc) — ABNORMAL HIGH (ref <130)
Total CHOL/HDL Ratio: 5.8 (calc) — ABNORMAL HIGH (ref <5.0)
Triglycerides: 172 mg/dL — ABNORMAL HIGH (ref <150)

## 2017-08-22 MED ORDER — METFORMIN HCL 850 MG PO TABS: 850.0000 mg | ORAL_TABLET | Freq: Two times a day (BID) | ORAL | 3 refills | Status: DC

## 2017-08-22 MED ORDER — INSULIN DEGLUDEC 100 UNIT/ML ~~LOC~~ SOPN
15.0000 [IU] | PEN_INJECTOR | Freq: Every day | SUBCUTANEOUS | 2 refills | Status: DC
Start: 2017-08-22 — End: 2017-10-06

## 2017-08-22 MED ORDER — ROSUVASTATIN CALCIUM 10 MG PO TABS: 10.0000 mg | ORAL_TABLET | Freq: Every day | ORAL | 1 refills | Status: DC

## 2017-08-22 MED ORDER — EMPAGLIFLOZIN 10 MG PO TABS: ORAL_TABLET | ORAL | 1 refills | Status: DC

## 2017-08-22 MED ORDER — TAMSULOSIN HCL 0.4 MG PO CAPS: 0.4000 mg | ORAL_CAPSULE | Freq: Every day | ORAL | 3 refills | Status: DC

## 2017-08-25 ENCOUNTER — Telehealth: Payer: Self-pay

## 2017-08-25 NOTE — Telephone Encounter (Signed)
Spoke with pt's wife she said that she didn't see and zocor or simvastatin  On any of his bottle , she didn't think he has been taking anything for his cholesterol.

## 2017-09-02 ENCOUNTER — Other Ambulatory Visit: Payer: Self-pay | Admitting: Internal Medicine

## 2017-09-03 NOTE — Telephone Encounter (Signed)
Medication Detail    Disp Refills Start End   flecainide (TAMBOCOR) 150 MG tablet 90 tablet 2 06/30/2017    Sig: TAKE 1/2 TABLET BY MOUTH TWICE DAILY   Sent to pharmacy as: flecainide (TAMBOCOR) 150 MG tablet   E-Prescribing Status: Receipt confirmed by pharmacy (06/30/2017 1:35 PM EDT)   Associated Diagnoses   PVC's (premature ventricular contractions)     Pharmacy   [id number] - [place], [place] - [address]

## 2017-10-04 ENCOUNTER — Other Ambulatory Visit: Payer: Self-pay | Admitting: Nurse Practitioner

## 2017-10-04 ENCOUNTER — Other Ambulatory Visit: Payer: Self-pay | Admitting: Medical

## 2017-10-06 ENCOUNTER — Other Ambulatory Visit: Payer: Self-pay | Admitting: Medical

## 2017-10-06 MED ORDER — INSULIN DEGLUDEC 100 UNIT/ML ~~LOC~~ SOPN: 15.0000 [IU] | PEN_INJECTOR | Freq: Every day | SUBCUTANEOUS | 2 refills | Status: DC

## 2017-10-06 NOTE — Telephone Encounter (Signed)
[facility]. Pt called to check on status of refills. Please send refill of Tresiba to [facility], [facility] [address]. Pt can be reached at [phone].

## 2017-10-12 ENCOUNTER — Other Ambulatory Visit: Payer: Self-pay | Admitting: Medical

## 2017-10-21 ENCOUNTER — Other Ambulatory Visit: Payer: Self-pay | Admitting: Medical

## 2017-11-10 ENCOUNTER — Other Ambulatory Visit: Payer: Self-pay | Admitting: Medical

## 2017-11-21 ENCOUNTER — Ambulatory Visit: Payer: Medicare Other | Admitting: Medical

## 2017-11-25 ENCOUNTER — Other Ambulatory Visit: Payer: Self-pay | Admitting: Medical

## 2017-11-26 NOTE — Telephone Encounter (Signed)
Is this ok to refill?  

## 2017-11-27 ENCOUNTER — Other Ambulatory Visit: Payer: Self-pay | Admitting: Internal Medicine

## 2017-11-27 ENCOUNTER — Other Ambulatory Visit: Payer: Self-pay | Admitting: Medical

## 2017-11-27 NOTE — Telephone Encounter (Signed)
Xarelto 20mg  refill request received; pt is 77 yrs old, Crea-0.75 on 08/21/17, wt-95.5kg, last seen by Dr. Lovena Le on 04/10/17, CrCl-116.18ml/min; will send in refill to requested pharmacy.

## 2017-11-27 NOTE — Telephone Encounter (Signed)
Refill and get in for med check visit

## 2017-12-01 ENCOUNTER — Other Ambulatory Visit: Payer: Self-pay | Admitting: Medical

## 2017-12-01 ENCOUNTER — Telehealth: Payer: Self-pay | Admitting: Medical

## 2017-12-01 NOTE — Telephone Encounter (Signed)
Pt called and wanted to know why we sent a refill in for Invokamet. I told him we received a refill request from pharmacy but he hasnt been on that medication in over a year. He did pick it up and pay for it. He wants to know why it was declined. Spoke to Marlette and pt is to stay on metformin. Please return pt's call at [phone].

## 2017-12-01 NOTE — Telephone Encounter (Signed)
Called and spoke with pharmacist at [facility] and stated that the pt's wife  requested to be on auto refill for all medicine . Several times pt has not known what he needs refill on , or gets his meds mix up on what he is taking . She said that she will have to discuss refund  with her the manger will call the pt an let him know if can get a refund.

## 2017-12-02 ENCOUNTER — Other Ambulatory Visit: Payer: Self-pay | Admitting: Medical

## 2017-12-02 DIAGNOSIS — D509 Iron deficiency anemia, unspecified: Secondary | ICD-10-CM

## 2017-12-02 NOTE — Telephone Encounter (Signed)
Is this ok to refill?  

## 2017-12-08 ENCOUNTER — Other Ambulatory Visit: Payer: Self-pay | Admitting: Internal Medicine

## 2017-12-08 ENCOUNTER — Ambulatory Visit: Payer: Medicare Other | Admitting: Neurology

## 2017-12-16 ENCOUNTER — Emergency Department (HOSPITAL_COMMUNITY): Payer: Medicare Other

## 2017-12-16 ENCOUNTER — Inpatient Hospital Stay (HOSPITAL_COMMUNITY)
Admission: EM | Admit: 2017-12-16 | Discharge: 2017-12-19 | DRG: 896 | Disposition: A | Discharge status: Home Health | Payer: Medicare Other | Attending: Family Medicine | Admitting: Family Medicine

## 2017-12-16 ENCOUNTER — Encounter (HOSPITAL_COMMUNITY): Payer: Self-pay | Admitting: Internal Medicine

## 2017-12-16 DIAGNOSIS — Z86711 Personal history of pulmonary embolism: Secondary | ICD-10-CM

## 2017-12-16 DIAGNOSIS — N400 Enlarged prostate without lower urinary tract symptoms: Secondary | ICD-10-CM | POA: Diagnosis present

## 2017-12-16 DIAGNOSIS — Z885 Allergy status to narcotic agent status: Secondary | ICD-10-CM

## 2017-12-16 DIAGNOSIS — Z7901 Long term (current) use of anticoagulants: Secondary | ICD-10-CM

## 2017-12-16 DIAGNOSIS — F0390 Unspecified dementia without behavioral disturbance: Secondary | ICD-10-CM | POA: Diagnosis present

## 2017-12-16 DIAGNOSIS — E1151 Type 2 diabetes mellitus with diabetic peripheral angiopathy without gangrene: Secondary | ICD-10-CM | POA: Diagnosis present

## 2017-12-16 DIAGNOSIS — R569 Unspecified convulsions: Secondary | ICD-10-CM | POA: Diagnosis not present

## 2017-12-16 DIAGNOSIS — G4733 Obstructive sleep apnea (adult) (pediatric): Secondary | ICD-10-CM | POA: Diagnosis present

## 2017-12-16 DIAGNOSIS — I2782 Chronic pulmonary embolism: Secondary | ICD-10-CM | POA: Diagnosis present

## 2017-12-16 DIAGNOSIS — F10129 Alcohol abuse with intoxication, unspecified: Principal | ICD-10-CM | POA: Diagnosis present

## 2017-12-16 DIAGNOSIS — Z7984 Long term (current) use of oral hypoglycemic drugs: Secondary | ICD-10-CM | POA: Diagnosis not present

## 2017-12-16 DIAGNOSIS — I110 Hypertensive heart disease with heart failure: Secondary | ICD-10-CM | POA: Diagnosis present

## 2017-12-16 DIAGNOSIS — I152 Hypertension secondary to endocrine disorders: Secondary | ICD-10-CM | POA: Diagnosis present

## 2017-12-16 DIAGNOSIS — I5032 Chronic diastolic (congestive) heart failure: Secondary | ICD-10-CM | POA: Diagnosis present

## 2017-12-16 DIAGNOSIS — R5381 Other malaise: Secondary | ICD-10-CM | POA: Diagnosis not present

## 2017-12-16 DIAGNOSIS — H9190 Unspecified hearing loss, unspecified ear: Secondary | ICD-10-CM | POA: Diagnosis present

## 2017-12-16 DIAGNOSIS — R410 Disorientation, unspecified: Secondary | ICD-10-CM | POA: Diagnosis not present

## 2017-12-16 DIAGNOSIS — R4781 Slurred speech: Secondary | ICD-10-CM | POA: Diagnosis not present

## 2017-12-16 DIAGNOSIS — I639 Cerebral infarction, unspecified: Secondary | ICD-10-CM

## 2017-12-16 DIAGNOSIS — R5383 Other fatigue: Secondary | ICD-10-CM | POA: Diagnosis present

## 2017-12-16 DIAGNOSIS — Z7951 Long term (current) use of inhaled steroids: Secondary | ICD-10-CM | POA: Diagnosis not present

## 2017-12-16 DIAGNOSIS — E118 Type 2 diabetes mellitus with unspecified complications: Secondary | ICD-10-CM | POA: Diagnosis not present

## 2017-12-16 DIAGNOSIS — I69992 Facial weakness following unspecified cerebrovascular disease: Secondary | ICD-10-CM | POA: Diagnosis not present

## 2017-12-16 DIAGNOSIS — R1110 Vomiting, unspecified: Secondary | ICD-10-CM | POA: Diagnosis not present

## 2017-12-16 DIAGNOSIS — E119 Type 2 diabetes mellitus without complications: Secondary | ICD-10-CM

## 2017-12-16 DIAGNOSIS — F329 Major depressive disorder, single episode, unspecified: Secondary | ICD-10-CM | POA: Diagnosis present

## 2017-12-16 DIAGNOSIS — G9340 Encephalopathy, unspecified: Secondary | ICD-10-CM | POA: Diagnosis present

## 2017-12-16 DIAGNOSIS — E1159 Type 2 diabetes mellitus with other circulatory complications: Secondary | ICD-10-CM | POA: Diagnosis present

## 2017-12-16 DIAGNOSIS — Z91030 Bee allergy status: Secondary | ICD-10-CM | POA: Diagnosis not present

## 2017-12-16 DIAGNOSIS — E785 Hyperlipidemia, unspecified: Secondary | ICD-10-CM | POA: Diagnosis present

## 2017-12-16 DIAGNOSIS — R4189 Other symptoms and signs involving cognitive functions and awareness: Secondary | ICD-10-CM | POA: Diagnosis present

## 2017-12-16 DIAGNOSIS — Z79899 Other long term (current) drug therapy: Secondary | ICD-10-CM

## 2017-12-16 DIAGNOSIS — R531 Weakness: Secondary | ICD-10-CM

## 2017-12-16 DIAGNOSIS — F10929 Alcohol use, unspecified with intoxication, unspecified: Secondary | ICD-10-CM | POA: Diagnosis present

## 2017-12-16 DIAGNOSIS — R4182 Altered mental status, unspecified: Secondary | ICD-10-CM | POA: Diagnosis not present

## 2017-12-16 DIAGNOSIS — I10 Essential (primary) hypertension: Secondary | ICD-10-CM | POA: Diagnosis present

## 2017-12-16 DIAGNOSIS — I2692 Saddle embolus of pulmonary artery without acute cor pulmonale: Secondary | ICD-10-CM | POA: Diagnosis not present

## 2017-12-16 DIAGNOSIS — I6523 Occlusion and stenosis of bilateral carotid arteries: Secondary | ICD-10-CM | POA: Diagnosis not present

## 2017-12-16 DIAGNOSIS — I4891 Unspecified atrial fibrillation: Secondary | ICD-10-CM | POA: Diagnosis present

## 2017-12-16 DIAGNOSIS — Z87891 Personal history of nicotine dependence: Secondary | ICD-10-CM

## 2017-12-16 DIAGNOSIS — I2699 Other pulmonary embolism without acute cor pulmonale: Secondary | ICD-10-CM | POA: Diagnosis present

## 2017-12-16 DIAGNOSIS — Y907 Blood alcohol level of 200-239 mg/100 ml: Secondary | ICD-10-CM | POA: Diagnosis present

## 2017-12-16 DIAGNOSIS — R269 Unspecified abnormalities of gait and mobility: Secondary | ICD-10-CM

## 2017-12-16 DIAGNOSIS — Z794 Long term (current) use of insulin: Secondary | ICD-10-CM | POA: Diagnosis not present

## 2017-12-16 DIAGNOSIS — R4020 Unspecified coma: Secondary | ICD-10-CM | POA: Diagnosis not present

## 2017-12-16 DIAGNOSIS — I6789 Other cerebrovascular disease: Secondary | ICD-10-CM | POA: Diagnosis not present

## 2017-12-16 LAB — COMPREHENSIVE METABOLIC PANEL
ALBUMIN: 4.1 g/dL (ref 3.5–5.0)
ALK PHOS: 81 U/L (ref 38–126)
ALT: 18 U/L (ref 17–63)
ANION GAP: 20 — AB (ref 5–15)
AST: 21 U/L (ref 15–41)
BILIRUBIN TOTAL: 0.3 mg/dL (ref 0.3–1.2)
BUN: 13 mg/dL (ref 6–20)
CO2: 18 mmol/L — ABNORMAL LOW (ref 22–32)
Calcium: 9.3 mg/dL (ref 8.9–10.3)
Chloride: 105 mmol/L (ref 101–111)
Creatinine, Ser: 0.68 mg/dL (ref 0.61–1.24)
GFR calc Af Amer: >60 mL/min (ref >60)
GLUCOSE: 175 mg/dL — AB (ref 65–99)
Potassium: 4 mmol/L (ref 3.5–5.1)
Sodium: 143 mmol/L (ref 135–145)
TOTAL PROTEIN: 7.5 g/dL (ref 6.5–8.1)

## 2017-12-16 LAB — URINALYSIS, ROUTINE W REFLEX MICROSCOPIC
BILIRUBIN URINE: NEGATIVE
Bacteria, UA: NONE SEEN
HGB URINE DIPSTICK: NEGATIVE
Ketones, ur: 20 mg/dL — AB
LEUKOCYTES UA: NEGATIVE
Nitrite: NEGATIVE
PH: 5 (ref 5.0–8.0)
Protein, ur: 30 mg/dL — AB
RBC / HPF: NONE SEEN RBC/hpf (ref 0–5)
SPECIFIC GRAVITY, URINE: 1.035 — AB (ref 1.005–1.030)
Squamous Epithelial / LPF: NONE SEEN

## 2017-12-16 LAB — CBC
HEMATOCRIT: 48.2 % (ref 39.0–52.0)
HEMOGLOBIN: 15.6 g/dL (ref 13.0–17.0)
MCH: 29.9 pg (ref 26.0–34.0)
MCHC: 32.4 g/dL (ref 30.0–36.0)
MCV: 92.3 fL (ref 78.0–100.0)
Platelets: 252 10*3/uL (ref 150–400)
RBC: 5.22 MIL/uL (ref 4.22–5.81)
RDW: 12.7 % (ref 11.5–15.5)
WBC: 10.9 10*3/uL — ABNORMAL HIGH (ref 4.0–10.5)

## 2017-12-16 LAB — I-STAT CHEM 8, ED
BUN: 12 mg/dL (ref 6–20)
CALCIUM ION: 1.14 mmol/L — AB (ref 1.15–1.40)
CREATININE: 1 mg/dL (ref 0.61–1.24)
Chloride: 104 mmol/L (ref 101–111)
GLUCOSE: 176 mg/dL — AB (ref 65–99)
HEMATOCRIT: 49 % (ref 39.0–52.0)
Hemoglobin: 16.7 g/dL (ref 13.0–17.0)
Potassium: 3.9 mmol/L (ref 3.5–5.1)
Sodium: 142 mmol/L (ref 135–145)
TCO2: 18 mmol/L — AB (ref 22–32)

## 2017-12-16 LAB — I-STAT TROPONIN, ED: Troponin i, poc: 0 ng/mL (ref 0.00–0.08)

## 2017-12-16 LAB — DIFFERENTIAL
Basophils Absolute: 0.1 10*3/uL (ref 0.0–0.1)
Basophils Relative: 1 %
EOS PCT: 1 %
Eosinophils Absolute: 0.1 10*3/uL (ref 0.0–0.7)
LYMPHS ABS: 0.9 10*3/uL (ref 0.7–4.0)
Lymphocytes Relative: 8 %
MONOS PCT: 3 %
Monocytes Absolute: 0.3 10*3/uL (ref 0.1–1.0)
NEUTROS PCT: 87 %
Neutro Abs: 9.5 10*3/uL — ABNORMAL HIGH (ref 1.7–7.7)

## 2017-12-16 LAB — RAPID URINE DRUG SCREEN, HOSP PERFORMED
Amphetamines: NOT DETECTED
BARBITURATES: NOT DETECTED
Benzodiazepines: NOT DETECTED
Cocaine: NOT DETECTED
Opiates: NOT DETECTED
TETRAHYDROCANNABINOL: NOT DETECTED

## 2017-12-16 LAB — PROTIME-INR
INR: 1.03
Prothrombin Time: 13.4 seconds (ref 11.4–15.2)

## 2017-12-16 LAB — CBG MONITORING, ED: GLUCOSE-CAPILLARY: 169 mg/dL — AB (ref 65–99)

## 2017-12-16 LAB — AMMONIA: Ammonia: 13 umol/L (ref 9–35)

## 2017-12-16 LAB — APTT: aPTT: 38 seconds — ABNORMAL HIGH (ref 24–36)

## 2017-12-16 LAB — ETHANOL: Alcohol, Ethyl (B): 211 mg/dL — ABNORMAL HIGH (ref <10)

## 2017-12-16 MED ORDER — SODIUM CHLORIDE 0.9 % IV BOLUS (SEPSIS)
1000.0000 mL | Freq: Once | INTRAVENOUS | Status: AC
  Administered 2017-12-16: 1000 mL via INTRAVENOUS

## 2017-12-16 NOTE — H&P (Signed)
TRH H&P   Patient Demographics:    Christian [name], is a 77 y.o. male  MRN: [id number]   DOB - 08-15-1941  Admit Date - 12/16/2017  Outpatient Primary MD for the patient is Tysinger, Camelia Eng, PA-C  Referring MD/NP/PA: Christ Kick  Outpatient Specialists:   Patient coming from: home  Chief Complaint  Patient presents with  . Altered Mental Status      HPI:    Christian [name]  is a 77 y.o. male, h/o PE/ DVT, (prothrombin gene mutation), hypertension, hyperlipidemia, ? Diastolic CHF, palitations on flecainide, cognitive decline who apparently was last seen well by wife around 10 am when she left for work.  He wasn't answering his phone so she had a friend check on him and he was unresponsive and EMS summoned.  Pt time EMS arrived he was responding but with slurred speech,   There was some question of right facial droop.  Pt was drinking 1.5 pints of liquor today because he felt lonely.    In ED,  Wife states that he does not drink on a regular basis and will not have etoh withdrawal.  BAL 211.   CT brain  IMPRESSION: 1. Findings felt to represent acute infarct in the right inferior, posterior frontal lobe and involving a portion of the medial anterior right temporal lobe near the sylvian fissure on the right.  2.  No mass or hemorrhage.  3.  Mucosal thickening in several ethmoid air cells.  4. Mild right-sided mastoid disease. Probable cerumen in each external auditory canal.  Neurology telemedicine called and did not think patient was having CVA, more likely related to etoh intoxication.  I d/w wife will hold Xarelto tonight because of risk of bleeding in case of CVA and restart if MRI brain negative. Pt and wife understand that risk of hold xarelto is increase risk of blood clot and are in agreement with plan.  Pt will be admitted for r/o CVA, and etoh intoxication.     Review of systems:    In addition to the HPI above, "Im better now,  I was just drunk" No Fever-chills, No Headache, No changes with Vision or hearing, No problems swallowing food or Liquids, No Chest pain, Cough or Shortness of Breath, No Abdominal pain, No Nausea or Vommitting, Bowel movements are regular, No Blood in stool or Urine, No dysuria, No new skin rashes or bruises, No new joints pains-aches,  No new weakness, tingling, numbness in any extremity, No recent weight gain or loss, No polyuria, polydypsia or polyphagia, No significant Mental Stressors.  A full 10 point Review of Systems was done, except as stated above, all other Review of Systems were negative.   With Past History of the following :    Past Medical History:  Diagnosis Date  . Agitation   . Arthritis   . Cataract   . Chronic back  pain   . Confusion   . Diabetes ([place])    type 2  . DVT (deep venous thrombosis) ([place]) 01/2015  . Hearing impaired   . High cholesterol   . Hypertension   . Insomnia   . Insomnia   . Leg pain, diffuse   . Leg swelling   . Memory loss   . Mild cognitive impairment    sees [facility]  . Numbness    fingers, feet, toes  . OSA on CPAP   . 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 06/14/2015  . Pulmonary embolism ([place]) 01/2015  . Spinal stenosis    lumbar  . Tremor    on propranolol  . Wears glasses       Past Surgical History:  Procedure Laterality Date  . CATARACT EXTRACTION    . COLONOSCOPY  2014  . TONSILLECTOMY        Social History:     Social History   Tobacco Use  . Smoking status: Former Smoker    Packs/day: 1.00    Years: 10.00    Pack years: 10.00    Types: Cigarettes    Last attempt to quit: 11/25/2004    Years since quitting: 13.0  . Smokeless tobacco: Never Used  Substance Use Topics  . Alcohol use: Yes    Alcohol/week: 0.0 oz    Comment: one every two months     Lives - lives at  home,   Mobility - walks by self   Family History :     Family History  Problem Relation Age of Onset  . Dementia Father   . Clotting disorder Mother   . Heart disease Brother   . Clotting disorder Brother   . Psychiatric Illness Sister   . Leukemia Sister       Home Medications:   Prior to Admission medications   Medication Sig Start Date End Date Taking? Authorizing Provider  budesonide-formoterol (SYMBICORT) 160-4.5 MCG/ACT inhaler Inhale 2 puffs into the lungs 2 (two) times daily. Patient taking differently: Inhale 2 puffs into the lungs daily as needed (for shortness of breath).  11/28/16  Yes Tysinger, Camelia Eng, PA-C  diltiazem (CARDIZEM) 30 MG tablet TAKE 1 TABLET BY MOUTH TWICE DAILY 12/08/17  Yes Evans Lance, MD  donepezil (ARICEPT) 10 MG tablet Take 1 tablet (10 mg total) by mouth at bedtime. 06/03/17  Yes Dennie Bible, NP  empagliflozin (JARDIANCE) 10 MG TABS tablet 1/2 tablet daily Patient taking differently: Take 10 mg by mouth daily.  08/22/17  Yes Tysinger, Camelia Eng, PA-C  flecainide (TAMBOCOR) 150 MG tablet TAKE 1/2 TABLET BY MOUTH TWICE DAILY 08/08/16  Yes Evans Lance, MD  insulin degludec (TRESIBA FLEXTOUCH) 100 UNIT/ML SOPN FlexTouch Pen Inject 0.15 mLs (15 Units total) daily into the skin. 10/06/17  Yes Tysinger, Camelia Eng, PA-C  JARDIANCE 10 MG TABS tablet TAKE 1 TABLET BY MOUTH DAILY 11/10/17  Yes Tysinger, Camelia Eng, PA-C  memantine (NAMENDA) 10 MG tablet TAKE 1 TABLET BY MOUTH TWICE DAILY 10/06/17  Yes Garvin Fila, MD  metFORMIN (GLUCOPHAGE) 850 MG tablet Take 1 tablet (850 mg total) by mouth 2 (two) times daily with a meal. 08/22/17  Yes Tysinger, Camelia Eng, PA-C  Misc Natural Products (OSTEO BI-FLEX ADV JOINT SHIELD PO) Take 1 tablet by mouth daily.    Yes [provider]  POLY-IRON 150 150 MG capsule TAKE 1 CAPSULE(150 MG) BY MOUTH DAILY 12/03/17  Yes  Tysinger, Camelia Eng, PA-C  rosuvastatin (CRESTOR) 10 MG tablet Take 1 tablet (10 mg total) by  mouth at bedtime. 08/22/17 08/22/18 Yes Tysinger, Camelia Eng, PA-C  tamsulosin (FLOMAX) 0.4 MG CAPS capsule TAKE 1 CAPSULE(0.4 MG) BY MOUTH DAILY 11/27/17  Yes Tysinger, Camelia Eng, PA-C  XARELTO 20 MG TABS tablet TAKE 1 TABLET(20 MG) BY MOUTH DAILY WITH SUPPER 11/27/17  Yes Tysinger, Camelia Eng, PA-C  flecainide (TAMBOCOR) 150 MG tablet TAKE 1/2 TABLET BY MOUTH TWICE DAILY Patient not taking: Reported on 12/16/2017 06/30/17   Evans Lance, MD  insulin degludec (TRESIBA) 100 UNIT/ML SOPN FlexTouch Pen Inject 0.15 mLs (15 Units total) daily at 10 pm into the skin. Patient not taking: Reported on 12/16/2017 10/06/17   Tysinger, Camelia Eng, PA-C  Insulin Pen Needle (BD PEN NEEDLE NANO U/F) 32G X 4 MM MISC 1 each by Does not apply route daily. DX E11.9 07/09/17   Tysinger, Camelia Eng, PA-C  INVOKAMET [phone] MG TABS TAKE 1 TABLET BY MOUTH TWICE DAILY Patient not taking: Reported on 12/16/2017 11/27/17   Carlena Hurl, PA-C  metFORMIN (GLUCOPHAGE) 850 MG tablet TAKE 1 TABLET BY MOUTH TWICE DAILY WITH MEALS Patient not taking: Reported on 12/16/2017 12/02/17   Tysinger, Camelia Eng, PA-C  tamsulosin (FLOMAX) 0.4 MG CAPS capsule Take 1 capsule (0.4 mg total) by mouth daily. Patient not taking: Reported on 12/16/2017 08/22/17   Carlena Hurl, PA-C  TRUE METRIX BLOOD GLUCOSE TEST test strip TEST TWICE DAILY AS DIRECTED 10/22/17   Tysinger, Camelia Eng, PA-C  XARELTO 20 MG TABS tablet TAKE 1 TABLET(20 MG) BY MOUTH DAILY WITH SUPPER Patient not taking: Reported on 12/16/2017 11/27/17   Evans Lance, MD     Allergies:     Allergies  Allergen Reactions  . Bee Venom Swelling  . Ultram [Tramadol Hcl]     Makes him wired, gives insomnia  . Oxycodone Other (See Comments)    Mental status changes  . Oxycontin [Oxycodone Hcl] Other (See Comments)    Mental status changes per pt     Physical Exam:   Vitals  Blood pressure (!) 144/77, pulse 79, temperature 97.6 F (36.4 C), temperature source Oral, resp. rate 16, SpO2 97  %.   1. General  lying in bed in NAD,    2. Normal affect and insight, Not Suicidal or Homicidal, Awake Alert, Oriented X 3.  3. No F.N deficits, ALL C.Nerves Intact, Strength 5/5 all 4 extremities, Sensation intact all 4 extremities, Plantars down going.  4. Ears and Eyes appear Normal, Conjunctivae clear, PERRLA. Moist Oral Mucosa.  5. Supple Neck, No JVD, No cervical lymphadenopathy appriciated, No Carotid Bruits.  6. Symmetrical Chest wall movement, Good air movement bilaterally, CTAB.  7. RRR, No Gallops, Rubs or Murmurs, No Parasternal Heave.  8. Positive Bowel Sounds, Abdomen Soft, No tenderness, No organomegaly appriciated,No rebound -guarding or rigidity.  9.  No Cyanosis, Normal Skin Turgor, No Skin Rash or Bruise.  10. Good muscle tone,  joints appear normal , no effusions, Normal ROM.  11. No Palpable Lymph Nodes in Neck or Axillae     Data Review:    CBC Recent Labs  Lab 12/16/17 1904 12/16/17 1918  WBC 10.9*  --   HGB 15.6 16.7  HCT 48.2 49.0  PLT 252  --   MCV 92.3  --   MCH 29.9  --   MCHC 32.4  --   RDW 12.7  --   LYMPHSABS 0.9  --  MONOABS 0.3  --   EOSABS 0.1  --   BASOSABS 0.1  --    ------------------------------------------------------------------------------------------------------------------  Chemistries  Recent Labs  Lab 12/16/17 1904 12/16/17 1918  NA 143 142  K 4.0 3.9  CL 105 104  CO2 18*  --   GLUCOSE 175* 176*  BUN 13 12  CREATININE 0.68 1.00  CALCIUM 9.3  --   AST 21  --   ALT 18  --   ALKPHOS 81  --   BILITOT 0.3  --    ------------------------------------------------------------------------------------------------------------------ CrCl cannot be calculated (Unknown ideal weight.). ------------------------------------------------------------------------------------------------------------------ No results for input(s): TSH, T4TOTAL, T3FREE, THYROIDAB in the last 72 hours.  Invalid input(s):  FREET3  Coagulation profile Recent Labs  Lab 12/16/17 1904  INR 1.03   ------------------------------------------------------------------------------------------------------------------- No results for input(s): DDIMER in the last 72 hours. -------------------------------------------------------------------------------------------------------------------  Cardiac Enzymes No results for input(s): CKMB, TROPONINI, MYOGLOBIN in the last 168 hours.  Invalid input(s): CK ------------------------------------------------------------------------------------------------------------------    Component Value Date/Time   BNP 176.5 (H) 07/11/2015 2239     ---------------------------------------------------------------------------------------------------------------  Urinalysis    Component Value Date/Time   COLORURINE YELLOW 12/16/2017 1901   APPEARANCEUR CLEAR 12/16/2017 1901   LABSPEC 1.035 (H) 12/16/2017 1901   PHURINE 5.0 12/16/2017 1901   GLUCOSEU >=500 (A) 12/16/2017 1901   HGBUR NEGATIVE 12/16/2017 1901   BILIRUBINUR NEGATIVE 12/16/2017 1901   BILIRUBINUR n 05/10/2016 1500   KETONESUR 20 (A) 12/16/2017 1901   PROTEINUR 30 (A) 12/16/2017 1901   UROBILINOGEN negative 05/10/2016 1500   UROBILINOGEN 0.2 01/31/2015 1505   NITRITE NEGATIVE 12/16/2017 1901   LEUKOCYTESUR NEGATIVE 12/16/2017 1901    ----------------------------------------------------------------------------------------------------------------   Imaging Results:    Ct Head Wo Contrast  Result Date: 12/16/2017 CLINICAL DATA:  Slurred speech and right facial droop.  Confusion. EXAM: CT HEAD WITHOUT CONTRAST TECHNIQUE: Contiguous axial images were obtained from the base of the skull through the vertex without intravenous contrast. COMPARISON:  Head CT October 27, 2016 and brain MRI June 19, 2017 FINDINGS: Brain: There is mild diffuse atrophy. There is no well-defined mass, hemorrhage, extra-axial fluid collection, or  midline shift. There is ill-defined decreased attenuation in the inferior right frontal lobe medial and anterior to the right sylvian fissure. This decreased attenuation involves a portion the right insular cortex along its inferior aspect as well as a portion of the right claustrum inferiorly. This areas felt to be consistent with an acute infarct. No other acute infarct evident. Vascular: There is no appreciable hyperdense vessel. There is no appreciable vascular calcification. Skull: Bony calvarium appears intact. Sinuses/Orbits: There is mucosal thickening in several ethmoid air cells bilaterally. Other visualized paranasal sinuses are clear. Orbits appear symmetric bilaterally except for apparent cataract extraction on the right. Other: There is minimal mastoid disease on the right posteriorly. Mastoids elsewhere clear. There is debris in each external auditory canal. IMPRESSION: 1. Findings felt to represent acute infarct in the right inferior, posterior frontal lobe and involving a portion of the medial anterior right temporal lobe near the sylvian fissure on the right. 2.  No mass or hemorrhage. 3.  Mucosal thickening in several ethmoid air cells. 4. Mild right-sided mastoid disease. Probable cerumen in each external auditory canal. These results were called by telephone at the time of interpretation on 12/16/2017 at 8:06 pm to Dr. Wilson Singer, ED physician , who verbally acknowledged these results. Electronically Signed   By: Lowella Grip III M.D.   On: 12/16/2017 20:06   Dg Chest Port 1  View  Result Date: 12/16/2017 CLINICAL DATA:  Vomiting EXAM: PORTABLE CHEST 1 VIEW COMPARISON:  10/27/2016 FINDINGS: Low volume chest with borderline cardiomegaly. There is no edema, consolidation, effusion, or pneumothorax. Artifact from EKG leads. Moderate distension of the stomach by gas. IMPRESSION: Limited low volume chest without acute finding. Electronically Signed   By: Monte Fantasia M.D.   On: 12/16/2017 19:39       Assessment & Plan:    Principal Problem:   Stroke [facility]) Active Problems:   Diabetes type 2, controlled ([place])   Pulmonary embolism (HCC)   Alcohol intoxication ([place])   AMS/ unresposive CT brain=> ? CVA Aspirin HOLD xarelto (risk and benefits discussed w patient's wife/patient, see above) MRI brain Carotid ultrasound Cardiac echo  ETOH intoxication CIWA  Depression ? Suicidal ideation (per wife, not patient) Tele psych consult  PE/DVT If MRI negative for CVA, please restart xarelto  ? Heart condition Cont flecainide Cont cardizem  Cognitive impairment Cont Aricept Cont Namenda  DM2: fsbs ac and qhs, ISS  Hyperlipidemia Cont Crestor    DVT Prophylaxis Lovenox - SCDs  AM Labs Ordered, also please review Full Orders  Family Communication: Admission, patients condition and plan of care including tests being ordered have been discussed with the patient  who indicate understanding and agree with the plan and Code Status.  Code Status FULL CODE  Likely DC to  home  Condition GUARDED    Consults called: psychiatry consult  Admission status: inpatient  Time spent in minutes : 45  Jani Gravel M.D on 12/16/2017 at 10:06 PM  Between 7am to 7pm - Pager - [phone]. After 7pm go to [email or web address] - password [facility]  [facility] - Office  [phone]

## 2017-12-16 NOTE — ED Notes (Signed)
Pt back from CT

## 2017-12-16 NOTE — ED Notes (Signed)
Pt's wife, Rockie Neighbours cell phone is [phone]

## 2017-12-16 NOTE — ED Triage Notes (Signed)
Wife went to work this morning at 10 am and pt was normal.  Friend went to check on pt one hour ago and found pt unresponsive.  When EMS arrived pt was responsive.  Had slurred speech,  Right sided facial droop , and some confusion.   CBG 207.  B/p 163/65.

## 2017-12-16 NOTE — Consult Note (Signed)
   [facility] Consult Services  Impression:  Patient appears to be more intoxicated than anything without any focal deficits on exam.  On my review of the CT, I don't see the hypodensity interpreted by radiology.  Lower suspicion for stroke with the clinical information and exam.  Would reconcile the CT with MRI, though.  Recommendations:  MRI brain wo Inpatient neurology consult - if stroke present on MRI can pursue further work up Supportive measures per admitting team for alcohol intoxication ---------------------------------------------------------------------  CC: AMS, abnormal CT  History of Present Illness:  STAT consult requested by Dr. Eulis Foster. Patient is a 77 year old man with DVT/PE on Xarelto, HTN, HLD, DM and cognitive decline who presented with AMS.  Was last seen well by his wife around 1000 when she left for work.  He wasn't answering the phone, so she had a friend check on him about an hour PTA.  Friend found the patient unresponsive and EMS summoned.  By the time EMS arrived he was responding, but with slurred speech.  Friend thought there was right facial droop, but this has not been evident in the ED.  Patient endorsed drinking 1.5 pints of liquor today because he felt lonely.  Wife states he is not a heavy drinker and not sure what would cause him to drink heavily today.  BAL was elevated at 211.  CT head was interpreted as an ill-defined right frontal-insular hypodensity concerning for acute stroke.  Diagnostic Testing: CT head wo - radiology interpreted right frontal/insular ill-defined hypodensity concerning for possible acute ischemia, but I do not appreciate this on my review  Vital Signs:   Reviewed in EMR  Exam:  Mental Status:  Awake, alert, oriented, a little disinhibited Naming: Intact Repetition: Intact   Speech: very subtle slurring  Cranial Nerves:  Pupils: Equal round and reactive to light Extraocular movements: Intact in all  cardinal gaze Ptosis: Absent Visual fields: Intact to finger counting Facial sensation: Intact to pin and light touch Facial movements: Intact and symmetric    Motor Exam:  No drift   Tremor/Abnormal Movements:  Resting tremor: Absent Intention tremor: Absent Postural tremor: Absent  Sensory Exam:   Light touch: Intact Pinprick: Intact    Coordination:   Finger to nose: Intact Heel to shin: Intact    Medical Decision Making:  - Extensive number of diagnosis or management options are considered above.   - Extensive amount of complex data reviewed.   - High risk of complication and/or morbidity or mortality are associated with differential diagnostic considerations above.  - There may be uncertain outcome and increased probability of prolonged functional impairment or high probability of severe prolonged functional impairment associated with some of these differential diagnosis.   Medical Data Reviewed:  1.Data reviewed include clinical labs, radiology,  Medical Tests;   2.Tests results discussed w/performing or interpreting physician;   3.Obtaining/reviewing old medical records;  4.Obtaining case history from another source;  5.Independent review of image, tracing or specimen.    Patient was informed the Neurology Consult would happen via telehealth (remote video) and consented to receiving care in this manner.

## 2017-12-16 NOTE — ED Notes (Signed)
Pt states he has not used cpap in a few years.

## 2017-12-16 NOTE — ED Provider Notes (Signed)
[facility] EMERGENCY DEPARTMENT Provider Note   CSN: [id number] Arrival date & time: 12/16/17  1855     History   Chief Complaint Chief Complaint  Patient presents with  . Altered Mental Status    HPI Christian [name] is a 77 y.o. male.  He presents for evaluation of altered mental status.  Arrival in the ED, [place] PM, last seen normal by wife after she left for work, around 10 AM this morning.  Patient is able to verbalize, but does not know why he is here.  Patient follows commands and is helpful for examination.  EMS was concerned that he had slurred speech, and a right facial droop, so on arrival the patient was "code stroke."  Level 5 caveat-confusion  HPI  Past Medical History:  Diagnosis Date  . Agitation   . Arthritis   . Cataract   . Chronic back pain   . Confusion   . Diabetes ([place])    type 2  . DVT (deep venous thrombosis) ([place]) 01/2015  . Hearing impaired   . High cholesterol   . Hypertension   . Insomnia   . Insomnia   . Leg pain, diffuse   . Leg swelling   . Memory loss   . Mild cognitive impairment    sees [facility]  . Numbness    fingers, feet, toes  . OSA on CPAP   . 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 06/14/2015  . Pulmonary embolism ([place]) 01/2015  . Spinal stenosis    lumbar  . Tremor    on propranolol  . Wears glasses     Patient Active Problem List   Diagnosis Date Noted  . Stroke ([place]) 12/16/2017  . Alcohol intoxication ([place]) 12/16/2017  . Wears hearing aid 06/09/2017  . Tinnitus of both ears 06/09/2017  . Impacted cerumen of right ear 06/09/2017  . Left wrist pain 06/26/2016  . Morning joint stiffness 06/26/2016  . Polyarthralgia 06/26/2016  . Type 2 diabetes mellitus with complication, without long-term current use of insulin ([place]) 04/02/2016  . Gait disturbance 04/02/2016  . Physical deconditioning 04/02/2016  . Pain of both shoulder joints 04/02/2016  . Leg  weakness, bilateral 04/02/2016  . Encounter for therapeutic drug monitoring 11/29/2015  . Encounter for health maintenance examination in adult 10/30/2015  . Shoulder pain, bilateral 10/30/2015  . Muscle weakness 10/30/2015  . Generalized weakness 10/30/2015  . Skin lesion 10/30/2015  . PVC (premature ventricular contraction) 10/10/2015  . Diabetes type 2, controlled ([place]) 06/20/2015  . Decreased energy 06/20/2015  . Pulmonary embolism ([place]) 06/20/2015  . DVT (deep venous thrombosis) ([place]) 06/20/2015  . Hammer toe of left foot 06/20/2015  . Pre-ulcerative calluses 06/20/2015  . Hypertrophic toenail 06/20/2015  . Diabetic mononeuropathy associated with diabetes mellitus due to underlying condition ([place]) 06/20/2015  . Leg pain, bilateral 06/20/2015  . Chronic low back pain 06/20/2015  . 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 06/14/2015  . Diabetes type 2, uncontrolled ([place]) 03/06/2015  . OSA on CPAP 03/06/2015  . Edema 03/06/2015  . Leg pain, diffuse 03/06/2015  . Bilateral low back pain without sciatica 03/06/2015  . Tremor 03/06/2015  . Insomnia 03/06/2015  . Vaccine counseling 03/06/2015  . Chronic back pain 03/06/2015  . Wears glasses 03/06/2015  . Hearing loss 03/06/2015  . Spinal stenosis of lumbar region 03/06/2015  . Ptosis 03/06/2015  . Pulmonary emboli ([place]) 03/06/2015  . Positive  depression screening 03/06/2015  . Advance directive discussed with patient 03/06/2015  . Bradycardia 02/27/2015  . Long-term (current) use of anticoagulants   . Dementia   . Essential hypertension 02/08/2015  . Hyperlipidemia 02/08/2015  . SOB (shortness of breath) 02/08/2015  . OSA (obstructive sleep apnea) 02/08/2015  . Mild cognitive impairment 02/08/2015  . PE (pulmonary embolism) 01/31/2015    Past Surgical History:  Procedure Laterality Date  . CATARACT EXTRACTION    . COLONOSCOPY  2014  . TONSILLECTOMY         Home  Medications    Prior to Admission medications   Medication Sig Start Date End Date Taking? Authorizing Provider  budesonide-formoterol (SYMBICORT) 160-4.5 MCG/ACT inhaler Inhale 2 puffs into the lungs 2 (two) times daily. Patient taking differently: Inhale 2 puffs into the lungs daily as needed (for shortness of breath).  11/28/16  Yes Tysinger, Camelia Eng, PA-C  diltiazem (CARDIZEM) 30 MG tablet TAKE 1 TABLET BY MOUTH TWICE DAILY 12/08/17  Yes Evans Lance, MD  donepezil (ARICEPT) 10 MG tablet Take 1 tablet (10 mg total) by mouth at bedtime. 06/03/17  Yes Dennie Bible, NP  empagliflozin (JARDIANCE) 10 MG TABS tablet 1/2 tablet daily Patient taking differently: Take 10 mg by mouth daily.  08/22/17  Yes Tysinger, Camelia Eng, PA-C  flecainide (TAMBOCOR) 150 MG tablet TAKE 1/2 TABLET BY MOUTH TWICE DAILY 08/08/16  Yes Evans Lance, MD  insulin degludec (TRESIBA FLEXTOUCH) 100 UNIT/ML SOPN FlexTouch Pen Inject 0.15 mLs (15 Units total) daily into the skin. 10/06/17  Yes Tysinger, Camelia Eng, PA-C  JARDIANCE 10 MG TABS tablet TAKE 1 TABLET BY MOUTH DAILY 11/10/17  Yes Tysinger, Camelia Eng, PA-C  memantine (NAMENDA) 10 MG tablet TAKE 1 TABLET BY MOUTH TWICE DAILY 10/06/17  Yes Garvin Fila, MD  metFORMIN (GLUCOPHAGE) 850 MG tablet Take 1 tablet (850 mg total) by mouth 2 (two) times daily with a meal. 08/22/17  Yes Tysinger, Camelia Eng, PA-C  Misc Natural Products (OSTEO BI-FLEX ADV JOINT SHIELD PO) Take 1 tablet by mouth daily.    Yes [provider]  POLY-IRON 150 150 MG capsule TAKE 1 CAPSULE(150 MG) BY MOUTH DAILY 12/03/17  Yes Tysinger, Camelia Eng, PA-C  rosuvastatin (CRESTOR) 10 MG tablet Take 1 tablet (10 mg total) by mouth at bedtime. 08/22/17 08/22/18 Yes Tysinger, Camelia Eng, PA-C  tamsulosin (FLOMAX) 0.4 MG CAPS capsule TAKE 1 CAPSULE(0.4 MG) BY MOUTH DAILY 11/27/17  Yes Tysinger, Camelia Eng, PA-C  XARELTO 20 MG TABS tablet TAKE 1 TABLET(20 MG) BY MOUTH DAILY WITH SUPPER 11/27/17  Yes Tysinger, Camelia Eng,  PA-C  flecainide (TAMBOCOR) 150 MG tablet TAKE 1/2 TABLET BY MOUTH TWICE DAILY Patient not taking: Reported on 12/16/2017 06/30/17   Evans Lance, MD  insulin degludec (TRESIBA) 100 UNIT/ML SOPN FlexTouch Pen Inject 0.15 mLs (15 Units total) daily at 10 pm into the skin. Patient not taking: Reported on 12/16/2017 10/06/17   Tysinger, Camelia Eng, PA-C  Insulin Pen Needle (BD PEN NEEDLE NANO U/F) 32G X 4 MM MISC 1 each by Does not apply route daily. DX E11.9 07/09/17   Tysinger, Camelia Eng, PA-C  INVOKAMET [phone] MG TABS TAKE 1 TABLET BY MOUTH TWICE DAILY Patient not taking: Reported on 12/16/2017 11/27/17   Carlena Hurl, PA-C  metFORMIN (GLUCOPHAGE) 850 MG tablet TAKE 1 TABLET BY MOUTH TWICE DAILY WITH MEALS Patient not taking: Reported on 12/16/2017 12/02/17   Tysinger, Camelia Eng, PA-C  tamsulosin (FLOMAX) 0.4 MG CAPS  capsule Take 1 capsule (0.4 mg total) by mouth daily. Patient not taking: Reported on 12/16/2017 08/22/17   Carlena Hurl, PA-C  TRUE METRIX BLOOD GLUCOSE TEST test strip TEST TWICE DAILY AS DIRECTED 10/22/17   Tysinger, Camelia Eng, PA-C  XARELTO 20 MG TABS tablet TAKE 1 TABLET(20 MG) BY MOUTH DAILY WITH SUPPER Patient not taking: Reported on 12/16/2017 11/27/17   Evans Lance, MD    Family History Family History  Problem Relation Age of Onset  . Dementia Father   . Clotting disorder Mother   . Heart disease Brother   . Clotting disorder Brother   . Psychiatric Illness Sister   . Leukemia Sister     Social History Social History   Tobacco Use  . Smoking status: Former Smoker    Packs/day: 1.00    Years: 10.00    Pack years: 10.00    Types: Cigarettes    Last attempt to quit: 11/25/2004    Years since quitting: 13.0  . Smokeless tobacco: Never Used  Substance Use Topics  . Alcohol use: Yes    Alcohol/week: 0.0 oz    Comment: one every two months  . Drug use: No     Allergies   Bee venom; Ultram [tramadol hcl]; Oxycodone; and Oxycontin [oxycodone hcl]   Review of  Systems Review of Systems  Unable to perform ROS: Mental status change     Physical Exam Updated Vital Signs BP (!) 144/77   Pulse 79   Temp 97.6 F (36.4 C) (Oral)   Resp 16   SpO2 97%   Physical Exam  Constitutional: He appears well-developed. He appears distressed (He is uncomfortable).  Elderly, overweight  HENT:  Head: Normocephalic and atraumatic.  Right Ear: External ear normal.  Left Ear: External ear normal.  Emesis, brown color on face.  Eyes: Conjunctivae and EOM are normal. Pupils are equal, round, and reactive to light. Right eye exhibits no discharge. Left eye exhibits no discharge.  Neck: Normal range of motion and phonation normal. Neck supple.  Cardiovascular: Normal rate, regular rhythm and normal heart sounds.  Pulmonary/Chest: Effort normal and breath sounds normal. He exhibits no bony tenderness.  Abdominal: Soft. There is no tenderness.  Musculoskeletal: Normal range of motion.  Neurological: He is alert. No cranial nerve deficit or sensory deficit. He exhibits normal muscle tone.  Mild dysarthria.  No apparent aphasia.  Normal gaze.  No pronator drift.  Able to independently hold arms and right leg off the stretcher for 5 seconds.  Left leg, dropped to stretcher after 3 seconds.  No facial asymmetry.  Normal grip strength hands bilaterally.  Skin: Skin is warm, dry and intact.  Psychiatric: He has a normal mood and affect. His behavior is normal. Judgment and thought content normal.  Nursing note and vitals reviewed.    ED Treatments / Results  Labs (all labs ordered are listed, but only abnormal results are displayed) Labs Reviewed  ETHANOL - Abnormal; Notable for the following components:      Result Value   Alcohol, Ethyl (B) 211 (*)    All other components within normal limits  APTT - Abnormal; Notable for the following components:   aPTT 38 (*)    All other components within normal limits  CBC - Abnormal; Notable for the following  components:   WBC 10.9 (*)    All other components within normal limits  DIFFERENTIAL - Abnormal; Notable for the following components:   Neutro Abs 9.5 (*)  All other components within normal limits  COMPREHENSIVE METABOLIC PANEL - Abnormal; Notable for the following components:   CO2 18 (*)    Glucose, Bld 175 (*)    Anion gap 20 (*)    All other components within normal limits  URINALYSIS, ROUTINE W REFLEX MICROSCOPIC - Abnormal; Notable for the following components:   Specific Gravity, Urine 1.035 (*)    Glucose, UA >=500 (*)    Ketones, ur 20 (*)    Protein, ur 30 (*)    All other components within normal limits  I-STAT CHEM 8, ED - Abnormal; Notable for the following components:   Glucose, Bld 176 (*)    Calcium, Ion 1.14 (*)    TCO2 18 (*)    All other components within normal limits  CBG MONITORING, ED - Abnormal; Notable for the following components:   Glucose-Capillary 169 (*)    All other components within normal limits  PROTIME-INR  RAPID URINE DRUG SCREEN, HOSP PERFORMED  AMMONIA  I-STAT TROPONIN, ED    EKG  EKG Interpretation  Date/Time:  Tuesday December 16 2017 18:58:18 EST Ventricular Rate:  78 PR Interval:    QRS Duration: 169 QT Interval:  442 QTC Calculation: 504 R Axis:   -74 Text Interpretation:  Sinus rhythm RBBB and LAFB Since last tracing PVCs resolved Confirmed by Daleen Bo [phone]) on 12/16/2017 7:30:27 PM       Radiology Ct Head Wo Contrast  Result Date: 12/16/2017 CLINICAL DATA:  Slurred speech and right facial droop.  Confusion. EXAM: CT HEAD WITHOUT CONTRAST TECHNIQUE: Contiguous axial images were obtained from the base of the skull through the vertex without intravenous contrast. COMPARISON:  Head CT October 27, 2016 and brain MRI June 19, 2017 FINDINGS: Brain: There is mild diffuse atrophy. There is no well-defined mass, hemorrhage, extra-axial fluid collection, or midline shift. There is ill-defined decreased attenuation in the  inferior right frontal lobe medial and anterior to the right sylvian fissure. This decreased attenuation involves a portion the right insular cortex along its inferior aspect as well as a portion of the right claustrum inferiorly. This areas felt to be consistent with an acute infarct. No other acute infarct evident. Vascular: There is no appreciable hyperdense vessel. There is no appreciable vascular calcification. Skull: Bony calvarium appears intact. Sinuses/Orbits: There is mucosal thickening in several ethmoid air cells bilaterally. Other visualized paranasal sinuses are clear. Orbits appear symmetric bilaterally except for apparent cataract extraction on the right. Other: There is minimal mastoid disease on the right posteriorly. Mastoids elsewhere clear. There is debris in each external auditory canal. IMPRESSION: 1. Findings felt to represent acute infarct in the right inferior, posterior frontal lobe and involving a portion of the medial anterior right temporal lobe near the sylvian fissure on the right. 2.  No mass or hemorrhage. 3.  Mucosal thickening in several ethmoid air cells. 4. Mild right-sided mastoid disease. Probable cerumen in each external auditory canal. These results were called by telephone at the time of interpretation on 12/16/2017 at 8:06 pm to Dr. Wilson Singer, ED physician , who verbally acknowledged these results. Electronically Signed   By: Lowella Grip III M.D.   On: 12/16/2017 20:06   Dg Chest Port 1 View  Result Date: 12/16/2017 CLINICAL DATA:  Vomiting EXAM: PORTABLE CHEST 1 VIEW COMPARISON:  10/27/2016 FINDINGS: Low volume chest with borderline cardiomegaly. There is no edema, consolidation, effusion, or pneumothorax. Artifact from EKG leads. Moderate distension of the stomach by gas. IMPRESSION: Limited low volume  chest without acute finding. Electronically Signed   By: Monte Fantasia M.D.   On: 12/16/2017 19:39    Procedures .Critical Care Performed by: Daleen Bo,  MD Authorized by: Daleen Bo, MD   Critical care provider statement:    Critical care time (minutes):  67   Critical care start time:  12/16/2017 6:55 PM   Critical care end time:  12/16/2017 8:20 PM   Critical care time was exclusive of:  Separately billable procedures and treating other patients   Critical care was necessary to treat or prevent imminent or life-threatening deterioration of the following conditions:  CNS failure or compromise   Critical care was time spent personally by me on the following activities:  Blood draw for specimens, development of treatment plan with patient or surrogate, discussions with consultants, evaluation of patient's response to treatment, examination of patient, obtaining history from patient or surrogate, ordering and performing treatments and interventions, ordering and review of laboratory studies, ordering and review of radiographic studies, pulse oximetry, re-evaluation of patient's condition and review of old charts   (including critical care time)  Medications Ordered in ED Medications  sodium chloride 0.9 % bolus 1,000 mL (0 mLs Intravenous Stopped 12/16/17 2101)     Initial Impression / Assessment and Plan / ED Course  I have reviewed the triage vital signs and the nursing notes.  Pertinent labs & imaging results that were available during my care of the patient were reviewed by me and considered in my medical decision making (see chart for details).  Clinical Course as of Dec 16 2312  Tue Dec 16, 2017  2016 At this time he remains alert and communicative.  No change in clinical exam.  Continues to have mild dysarthria, and confusion, and mild left leg weakness.  Gaze continues to be normal.  No distinct a aphasia.  No neglect.  [EW]    Clinical Course User Index [EW] Daleen Bo, MD     Patient Vitals for the past 24 hrs:  BP Temp Temp src Pulse Resp SpO2  12/16/17 2130 (!) 144/77 - - - 16 -  12/16/17 2100 131/68 - - 79 12 97 %    12/16/17 2030 (!) 103/56 - - 71 12 97 %  12/16/17 2015 - - - 76 13 98 %  12/16/17 2000 127/67 - - 72 13 98 %  12/16/17 1945 - - - 68 11 99 %  12/16/17 1930 137/72 - - 73 12 96 %  12/16/17 1915 - - - 68 12 93 %  12/16/17 1900 135/78 97.6 F (36.4 C) Oral 77 12 94 %    Large Artery Stroke Screening     Weakness:  Mild (minor drift)  Vision:  NONE  Aphasia:  NONE  Neglect:  NONE:  VAN =  Negative  If patient has any weakness PLUS any one of the below: Visual Disturbance (field cut, double, or blind vision) Aphasia (inability to speak or understand) Neglect (gaze to one side or ignoring one side) This is likely a large artery clot (cortical symptoms) = VAN Positive    8:14 PM-requested callback from tele-neurology for evaluation.  Patient's wife is not yet shown up.  We tried to contact her by phone and there was no answer.  Message left for her to return the call.    8: 30 p.m.-Case discussed with tele-neurology who agrees to evaluate the patient for consultation       9:03 PM Reevaluation with update and discussion. After initial  assessment and treatment, an updated evaluation reveals no change in clinical status.  He continues to be dysarthric. Daleen Bo   22: 00 p.m.-patient's wife is now concerned that the patient is suicidal.  Hospitalist notified and he will address this issue.  Final Clinical Impressions(s) / ED Diagnoses   Final diagnoses:  Cerebrovascular accident (CVA), unspecified mechanism ([place])  Alcoholic intoxication with complication ([place])    Evaluation consistent with CVA, and acute alcohol intoxication.  Patient requires admission for further evaluation, MRI testing, and reassessment after sober.  He may require a psychiatric evaluation for stated suicidal ideation.  Nursing Notes Reviewed/ Care Coordinated Applicable Imaging Reviewed Interpretation of Laboratory Data incorporated into ED treatment  Plan: [place]  ED Discharge Orders     None       Daleen Bo, MD 12/16/17 2315

## 2017-12-16 NOTE — ED Notes (Signed)
Pt's wife called this RN aside and spoke about how pt has made the comment he wants to "end it all"; pt has stated he should "just shoot myself";  Pt does have access to guns;  Dr. Eulis Foster notified and told to inform Dr. Maudie Mercury; Dr. Maudie Mercury in with pt at this time and pt's wife spoke with Dr. Maudie Mercury; pt admits to drinking "jack daniels and moonshine" today

## 2017-12-17 ENCOUNTER — Encounter (HOSPITAL_COMMUNITY): Payer: Self-pay | Admitting: *Deleted

## 2017-12-17 ENCOUNTER — Other Ambulatory Visit: Payer: Self-pay

## 2017-12-17 ENCOUNTER — Inpatient Hospital Stay (HOSPITAL_COMMUNITY): Payer: Medicare Other

## 2017-12-17 LAB — HEMOGLOBIN A1C
Hgb A1c MFr Bld: 8 % — ABNORMAL HIGH (ref 4.8–5.6)
Mean Plasma Glucose: 182.9 mg/dL

## 2017-12-17 LAB — MRSA PCR SCREENING: MRSA by PCR: INVALID — AB

## 2017-12-17 LAB — LIPID PANEL
Cholesterol: 132 mg/dL (ref 0–200)
HDL: 38 mg/dL — AB (ref >40)
LDL CALC: 80 mg/dL (ref 0–99)
TRIGLYCERIDES: 69 mg/dL (ref <150)
Total CHOL/HDL Ratio: 3.5 RATIO
VLDL: 14 mg/dL (ref 0–40)

## 2017-12-17 LAB — GLUCOSE, CAPILLARY: Glucose-Capillary: 111 mg/dL — ABNORMAL HIGH (ref 65–99)

## 2017-12-17 MED ORDER — THIAMINE HCL 100 MG/ML IJ SOLN
100.0000 mg | Freq: Every day | INTRAMUSCULAR | Status: DC
  Administered 2017-12-17 – 2017-12-18 (×2): 100 mg via INTRAVENOUS
  Filled 2017-12-17 (×3): qty 2

## 2017-12-17 MED ORDER — ACETAMINOPHEN 160 MG/5ML PO SOLN: 650.0000 mg | ORAL | Status: DC | PRN

## 2017-12-17 MED ORDER — STROKE: EARLY STAGES OF RECOVERY BOOK
Freq: Once | Status: AC
  Administered 2017-12-17: 17:00:00
  Filled 2017-12-17: qty 1

## 2017-12-17 MED ORDER — M.V.I. ADULT IV INJ
INJECTION | INTRAVENOUS | Status: AC
  Filled 2017-12-17: qty 10

## 2017-12-17 MED ORDER — ASPIRIN 300 MG RE SUPP: 300.0000 mg | Freq: Every day | RECTAL | Status: DC

## 2017-12-17 MED ORDER — ROSUVASTATIN CALCIUM 20 MG PO TABS
20.0000 mg | ORAL_TABLET | Freq: Every day | ORAL | Status: DC
  Administered 2017-12-17 – 2017-12-18 (×2): 20 mg via ORAL
  Filled 2017-12-17 (×2): qty 1

## 2017-12-17 MED ORDER — ROSUVASTATIN CALCIUM 10 MG PO TABS
10.0000 mg | ORAL_TABLET | Freq: Every day | ORAL | Status: DC
  Administered 2017-12-17: 10 mg via ORAL
  Filled 2017-12-17: qty 1

## 2017-12-17 MED ORDER — DILTIAZEM HCL 30 MG PO TABS
30.0000 mg | ORAL_TABLET | Freq: Two times a day (BID) | ORAL | Status: DC
  Administered 2017-12-17 – 2017-12-18 (×4): 30 mg via ORAL
  Filled 2017-12-17 (×4): qty 1

## 2017-12-17 MED ORDER — RIVAROXABAN 20 MG PO TABS: 20.0000 mg | ORAL_TABLET | Freq: Every day | ORAL | Status: DC

## 2017-12-17 MED ORDER — TAMSULOSIN HCL 0.4 MG PO CAPS
0.4000 mg | ORAL_CAPSULE | Freq: Every day | ORAL | Status: DC
  Administered 2017-12-17 – 2017-12-18 (×3): 0.4 mg via ORAL
  Filled 2017-12-17 (×3): qty 1

## 2017-12-17 MED ORDER — ACETAMINOPHEN 650 MG RE SUPP: 650.0000 mg | RECTAL | Status: DC | PRN

## 2017-12-17 MED ORDER — DONEPEZIL HCL 5 MG PO TABS
10.0000 mg | ORAL_TABLET | Freq: Every day | ORAL | Status: DC
  Administered 2017-12-17 – 2017-12-18 (×3): 10 mg via ORAL
  Filled 2017-12-17 (×3): qty 2

## 2017-12-17 MED ORDER — MEMANTINE HCL 10 MG PO TABS
10.0000 mg | ORAL_TABLET | Freq: Two times a day (BID) | ORAL | Status: DC
  Administered 2017-12-17 – 2017-12-19 (×6): 10 mg via ORAL
  Filled 2017-12-17 (×6): qty 1

## 2017-12-17 MED ORDER — LORAZEPAM 2 MG/ML IJ SOLN
2.0000 mg | INTRAMUSCULAR | Status: DC | PRN
  Administered 2017-12-17: 2 mg via INTRAVENOUS
  Filled 2017-12-17: qty 1

## 2017-12-17 MED ORDER — RIVAROXABAN 20 MG PO TABS
20.0000 mg | ORAL_TABLET | Freq: Every day | ORAL | Status: DC
  Administered 2017-12-17 – 2017-12-19 (×3): 20 mg via ORAL
  Filled 2017-12-17 (×3): qty 1

## 2017-12-17 MED ORDER — FOLIC ACID 5 MG/ML IJ SOLN
1.0000 mg | Freq: Every day | INTRAMUSCULAR | Status: DC
  Administered 2017-12-17: 1 mg via INTRAVENOUS
  Filled 2017-12-17 (×6): qty 0.2

## 2017-12-17 MED ORDER — FLECAINIDE ACETATE 50 MG PO TABS
75.0000 mg | ORAL_TABLET | Freq: Two times a day (BID) | ORAL | Status: DC
  Administered 2017-12-17 – 2017-12-19 (×6): 75 mg via ORAL
  Filled 2017-12-17 (×2): qty 1
  Filled 2017-12-17 (×7): qty 2

## 2017-12-17 MED ORDER — ASPIRIN 325 MG PO TABS: 325.0000 mg | ORAL_TABLET | Freq: Every day | ORAL | Status: DC

## 2017-12-17 MED ORDER — THIAMINE HCL 100 MG/ML IJ SOLN
Freq: Once | INTRAVENOUS | Status: AC
  Administered 2017-12-17: 01:00:00 via INTRAVENOUS
  Filled 2017-12-17: qty 1000

## 2017-12-17 MED ORDER — ACETAMINOPHEN 325 MG PO TABS
650.0000 mg | ORAL_TABLET | ORAL | Status: DC | PRN
  Administered 2017-12-17: 650 mg via ORAL
  Filled 2017-12-17: qty 2

## 2017-12-17 MED ORDER — THIAMINE HCL 100 MG/ML IJ SOLN
INTRAMUSCULAR | Status: AC
  Filled 2017-12-17: qty 2

## 2017-12-17 MED ORDER — FOLIC ACID 5 MG/ML IJ SOLN
INTRAMUSCULAR | Status: AC
  Filled 2017-12-17: qty 0.2

## 2017-12-17 NOTE — Progress Notes (Signed)
PROGRESS NOTE    Patient: Christian [name]     PCP: Carlena Hurl, PA-C                    DOB: 1941-01-12            DOA: 12/16/2017 [id number]:[id number]             DOS: 12/17/2017, 10:36 AM   Date of Service: the patient was seen and examined on 12/17/2017 Subjective:   Examined this morning post MRI, alert and oriented no acute distress negative for any focal neurological findings, speech intact, cognition intact.  ----------------------------------------------------------------------------------------------------------------------  Brief Narrative:  Christian [name]  is a 77 y.o. male, h/o PE/ DVT, (prothrombin gene mutation), hypertension, hyperlipidemia, ? Diastolic CHF, palitations on flecainide, cognitive decline who apparently was last seen well by wife around 10 am when she left for work.  He wasn't answering his phone so she had a friend check on him and he was unresponsive and EMS summoned.  Pt time EMS arrived he was responding but with slurred speech,   There was some question of right facial droop.  Pt was drinking 1.5 pints of liquor today because he felt lonely.    In ED,  Wife states that he does not drink on a regular basis and will not have etoh withdrawal.  BAL 211.   Assessment & Plan:   Altered mental status/unresponsive -likely alcohol intoxication - CVA ruled out, CT of the head was reviewed, MRI of the head was negative, Will resume home dose of Xarelto (patient has been on it due to history of PE Bilateral carotid studies revealing less than 50% stenosis bilateral Any 2D echocardiogram Continue neurochecks Ending PT/OT/speech elevation recommendation  Alcohol intoxication -  He is awake alert oriented reports that he drinks every other weeks small amount does get drunk at times. He has been advised to quit given his medical conditions . Continue CIWA protocol  Depression -patient was assessed thoroughly, Mini-Mental exam completed Patient currently does not  reveal any sign of withdrawal, denies any suicidal or homicidal ideation Open to supportive therapy, currently reluctant except depression medication, stating he does not want to add more medication to his list.  History of PE/ DVT - He has been on Xarelto which will be continued recommending follow-up with PCP, as he has been on anticoagulants since 2016  Diabetes mellitus -pending speech evaluation once tolerating p.o. we will check his blood sugar QA CHS with sliding scale insulin  Personal history proximal A. fib, - Currently normal sinus rhythm Cardizem and flecainide which will be continued along with Xarelto  Dementia -  At baseline, once tolerating p.o. we will resume home medication of Aricept and Namenda  BPH  Continue Flomax  DVT prophylaxis:       SCDs/compression stockings         Code Status:         Full code Family Communication:  The above findings and plan of care has been discussed with patient  in detail, they expressed understanding and agreement of above.   Disposition Plan:  1-2 days,  Home                Consultants:   Neurology  Procedures:   CT of the head/MRI of the head/2D echocardiogram/carotid studies  Antimicrobials:  Anti-infectives (From admission, onward)   None       Objective: Vitals:   12/17/17 0500 12/17/17 0600 12/17/17 0700 12/17/17 0816  BP: [name])  134/59 (!) 154/57 (!) 139/56   Pulse: 74 85 80 91  Resp: 13 (!) 21 11 (!) 30  Temp: 98.9 F (37.2 C)   98.7 F (37.1 C)  TempSrc: Oral   Oral  SpO2: 95% 94% 95% 93%  Weight:      Height:        Intake/Output Summary (Last 24 hours) at 12/17/2017 1036 Last data filed at 12/17/2017 0700 Gross per 24 hour  Intake 1000 ml  Output 1260 ml  Net -260 ml   Filed Weights   12/17/17 0005  Weight: 97.2 kg (214 lb 4.6 oz)    Examination:  General exam: Appears calm and comfortable  Respiratory system: Clear to auscultation. Respiratory effort normal. Cardiovascular system:  S1 & S2 heard, RRR. No JVD, murmurs, rubs, gallops or clicks. No pedal edema. Gastrointestinal system: Abdomen is nondistended, soft and nontender. No organomegaly or masses felt. Normal bowel sounds heard. Central nervous system: Alert and oriented. No focal neurological deficits.  Cranial nerves through 12 within normal limits, sensory intact, motor intact cognition intact, negative  pronator drift, Speech intact, negative any facial asymmetry Extremities: Symmetric 5 x 5 power. Skin: No rashes, lesions or ulcers Psychiatry: Judgement and insight appear normal. Mood & affect appropriate.     Data Reviewed: I have personally reviewed following labs and imaging studies  CBC: Recent Labs  Lab 12/16/17 1904 12/16/17 1918  WBC 10.9*  --   NEUTROABS 9.5*  --   HGB 15.6 16.7  HCT 48.2 49.0  MCV 92.3  --   PLT 252  --    Basic Metabolic Panel: Recent Labs  Lab 12/16/17 1904 12/16/17 1918  NA 143 142  K 4.0 3.9  CL 105 104  CO2 18*  --   GLUCOSE 175* 176*  BUN 13 12  CREATININE 0.68 1.00  CALCIUM 9.3  --    GFR: Estimated Creatinine Clearance: 77.1 mL/min (by C-G formula based on SCr of 1 mg/dL). Liver Function Tests: Recent Labs  Lab 12/16/17 1904  AST 21  ALT 18  ALKPHOS 81  BILITOT 0.3  PROT 7.5  ALBUMIN 4.1   No results for input(s): LIPASE, AMYLASE in the last 168 hours. Recent Labs  Lab 12/16/17 1916  AMMONIA 13   Coagulation Profile: Recent Labs  Lab 12/16/17 1904  INR 1.03   Cardiac Enzymes: No results for input(s): CKTOTAL, CKMB, CKMBINDEX, TROPONINI in the last 168 hours. BNP (last 3 results) No results for input(s): PROBNP in the last 8760 hours. HbA1C: No results for input(s): HGBA1C in the last 72 hours. CBG: Recent Labs  Lab 12/16/17 1902 12/17/17 0045  GLUCAP 169* 111*   Lipid Profile: Recent Labs    12/17/17 0405  CHOL 132  HDL 38*  LDLCALC 80  TRIG 69  CHOLHDL 3.5   Thyroid Function Tests: No results for input(s): TSH,  T4TOTAL, FREET4, T3FREE, THYROIDAB in the last 72 hours. Anemia Panel: No results for input(s): VITAMINB12, FOLATE, FERRITIN, TIBC, IRON, RETICCTPCT in the last 72 hours. Sepsis Labs: No results for input(s): PROCALCITON, LATICACIDVEN in the last 168 hours.  Recent Results (from the past 240 hour(s))  MRSA PCR Screening     Status: Abnormal   Collection Time: 12/16/17 11:52 PM  Result Value Ref Range Status   MRSA by PCR INVALID RESULTS, SPECIMEN SENT FOR CULTURE (A) NEGATIVE Final    Comment: RESULT CALLED TO, READ BACK BY AND VERIFIED WITH: WAGONER,R @ 0515 ON 1.23.19 BY BOWMAN,L  The GeneXpert MRSA Assay (FDA approved for NASAL specimens only), is one component of a comprehensive MRSA colonization surveillance program. It is not intended to diagnose MRSA infection nor to guide or monitor treatment for MRSA infections.        Radiology Studies:  Ct Head Wo Contrast  Result Date: 12/16/2017 CLINICAL DATA:  Slurred speech and right facial droop.  Confusion. EXAM: CT HEAD W. IMPRESSION: 1. Findings felt to represent acute infarct in the right inferior, posterior frontal lobe and involving a portion of the medial anterior right temporal lobe near the sylvian fissure on the right. 2.  No mass or hemorrhage. 3.  Mucosal thickening in several ethmoid air cells. 4. Mild right-sided mastoid disease. Probable cerumen in each external auditory canal. These results were called by telephone at the time of interpretation on 12/16/2017 at 8:06 pm to Dr. Wilson Singer, ED physician , who verbally acknowledged these results. Electronically Signed   By: Lowella Grip III M.D.   On: 12/16/2017 20:06   Mr Brain Wo Contrast  Result Date: 12/17/2017 CLINICAL DATA:  Patient found unresponsive. Slurred speech, right-sided facial droop, confusion. EXAM: MRI HEAD WITHOUT CONTRAST MRA HEAD WITHOUT CONTRAST TECHNIQUE: Multiplanar, multiecho pulse sequences of the brain and surrounding structures were  obtained without intravenous contrast. Angiographic images of the head were obtained using MRA technique without contrast. COMPARISON:  CT head without contrast 12/16/2017.  IMPRESSION: 1. No acute or subacute infarct. 2. Normal MRI appearance brain for age. 3. Mild distal small vessel disease evident on the MRA. No significant proximal stenosis, aneurysm, or branch vessel occlusion. Electronically Signed   By: San Morelle M.D.   On: 12/17/2017 09:40   US Carotid Bilateral (at [facility] And [facility] Only)  Result Date: 12/17/2017 CLINICAL DATA:  Stroke symptoms, hypertension, hyperlipidemia EXAM: BILATERAL CAROTID DUPLEX ULTRASOUND TECHNIQUE: Pearline Cables scale imaging, color Doppler and duplex ultrasound were performed of bilateral carotid and vertebral arteries in the neck. COMPARISON:  None. FI  IMPRESSION: Minor carotid atherosclerosis. No hemodynamically significant ICA stenosis. Degree of narrowing less than 50% bilaterally by ultrasound criteria. Patent antegrade vertebral flow bilaterally Electronically Signed   By: Jerilynn Mages.  Shick M.D.   On: 12/17/2017 09:59   Dg Chest Port 1 View  Result Date: 12/16/2017 CLINICAL DATA:  Vomiting EXAM: PORTABLE CHEST 1 VIEW COMPARISON:  10/27/2016 FINDINGS: Low volume chest with borderline cardiomegaly. There is no edema, consolidation, effusion, or pneumothorax. Artifact from EKG leads. Moderate distension of the stomach by gas. IMPRESSION: Limited low volume chest without acute finding. Electronically Signed   By: Monte Fantasia M.D.   On: 12/16/2017 19:39   Mr Jodene Nam Head/brain NW Cm  Result Date: 12/17/2017 CLINICAL DATA:  Patient found unresponsive. Slurred speech, right-sided facial droop, confusion. EXAM: MRI HEAD WITHOUT CONTRAST MRA HEAD WITHOUT CONTRAST TECHNI IMPRESSION: 1. No acute or subacute infarct. 2. Normal MRI appearance brain for age. 3. Mild distal small vessel disease evident on the MRA. No significant proximal stenosis, aneurysm, or branch vessel  occlusion. Electronically Signed   By: San Morelle M.D.   On: 12/17/2017 09:40    Scheduled Meds: .  stroke: mapping our early stages of recovery book   Does not apply Once  . aspirin  300 mg Rectal Daily   Or  . aspirin  325 mg Oral Daily  . diltiazem  30 mg Oral BID  . donepezil  10 mg Oral QHS  . flecainide  75 mg Oral BID  . folic acid  1 mg Intravenous  Daily  . memantine  10 mg Oral BID  . rosuvastatin  20 mg Oral QHS  . tamsulosin  0.4 mg Oral QHS  . thiamine  100 mg Intravenous Daily   Continuous Infusions:   LOS: 1 day    Time spent: >25 minutes   Deatra James, MD [facility] Pager [phone]  If 7PM-7AM, please contact night-coverage [email or web address] Password [facility] 12/17/2017, 10:36 AM

## 2017-12-17 NOTE — Progress Notes (Signed)
In room with Dr. When questioning patient about intending to harm hisself or others, patient stated no.

## 2017-12-17 NOTE — Progress Notes (Signed)
Security wanded patient 

## 2017-12-17 NOTE — Consult Note (Signed)
[place] A. Merlene Laughter, MD     [email or web address]          Christian [name] is an 77 y.o. male.   ASSESSMENT/PLAN: 1.   Acute encephalopathy/syncope likely due to alcoholic intoxication: No evidence on imaging of acute ischemic stroke or other intracranial processes.  Alcohol cessation discussed with the patient.  No additional workup warranted at this time.   An EEG may be warranted in the future if he has recurrence symptoms without association with alcohol use.  2.   Baseline mild cognitive impairment:  3.   Prothrombin mutation with life-threatening PE:  Okay to restart anticoagulation.    The patient is a 77 year old white male who presented with unresponsiveness, syncope and confusion.  He was evaluated for stroke code but he was not a candidate based upon his presentation and the fact that he is on anticoagulation.  The patient has recovered and the admits to consuming a large quantity of alcoholic beverage before he blacked out.  He does not report having focal neurological symptoms such as numbness, weakness, dysarthria dysphagia.  No headaches are reported.  No dizziness, diplopia are other neurological symptoms.  He does have a baseline history of pulmonary embolism due to prothrombin mutation.  He also has a history of mild cognitive impairment and is being seen by [facility] group.   GENERAL:   He is doing well and in no acute distress.  HEENT:   This is normal.  ABDOMEN: soft  EXTREMITIES: No edema   BACK:  Normal  SKIN: Normal by inspection.    MENTAL STATUS: Alert and oriented. Speech, language and cognition are generally intact. Judgment and insight normal.   CRANIAL NERVES: Pupils are equal, round and reactive to light and accomodation; extra ocular movements are full, there is no significant nystagmus; visual fields are full; upper and lower facial muscles are normal in strength and symmetric, there is no flattening of the nasolabial  folds; tongue is midline; uvula is midline; shoulder elevation is normal.  MOTOR: Normal tone, bulk and strength; no pronator drift.  COORDINATION: Left finger to nose is normal, right finger to nose is normal, No rest tremor; no intention tremor; no postural tremor; no bradykinesia.  REFLEXES: Deep tendon reflexes are symmetrical and normal. Plantar reflexes are flexor bilaterally.   SENSATION: Normal to light touch.      Blood pressure 130/68, pulse 69, temperature 98.1 F (36.7 C), temperature source Oral, resp. rate 20, height '5\' 11"'$  (1.803 m), weight 214 lb 4.6 oz (97.2 kg), SpO2 96 %.  Past Medical History:  Diagnosis Date  . Agitation   . Arthritis   . Cataract   . Chronic back pain   . Confusion   . Diabetes ([place])    type 2  . DVT (deep venous thrombosis) ([place]) 01/2015  . Hearing impaired   . High cholesterol   . Hypertension   . Insomnia   . Insomnia   . Leg pain, diffuse   . Leg swelling   . Memory loss   . Mild cognitive impairment    sees [facility]  . Numbness    fingers, feet, toes  . OSA on CPAP   . 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 06/14/2015  . Pulmonary embolism ([place]) 01/2015  . Spinal stenosis    lumbar  . Tremor    on propranolol  . Wears glasses     Past Surgical History:  Procedure Laterality  Date  . CATARACT EXTRACTION    . COLONOSCOPY  2014  . TONSILLECTOMY      Family History  Problem Relation Age of Onset  . Dementia Father   . Clotting disorder Mother   . Heart disease Brother   . Clotting disorder Brother   . Psychiatric Illness Sister   . Leukemia Sister     Social History:  reports that he quit smoking about 13 years ago. His smoking use included cigarettes. He has a 10.00 pack-year smoking history. he has never used smokeless tobacco. He reports that he drinks alcohol. He reports that he does not use drugs.  Allergies:  Allergies  Allergen Reactions  . Bee  Venom Swelling  . Ultram [Tramadol Hcl]     Makes him wired, gives insomnia  . Oxycodone Other (See Comments)    Mental status changes  . Oxycontin [Oxycodone Hcl] Other (See Comments)    Mental status changes per pt    Medications: Prior to Admission medications   Medication Sig Start Date End Date Taking? Authorizing Provider  budesonide-formoterol (SYMBICORT) 160-4.5 MCG/ACT inhaler Inhale 2 puffs into the lungs 2 (two) times daily. Patient taking differently: Inhale 2 puffs into the lungs daily as needed (for shortness of breath).  11/28/16  Yes Tysinger, Camelia Eng, PA-C  diltiazem (CARDIZEM) 30 MG tablet TAKE 1 TABLET BY MOUTH TWICE DAILY 12/08/17  Yes Evans Lance, MD  donepezil (ARICEPT) 10 MG tablet Take 1 tablet (10 mg total) by mouth at bedtime. 06/03/17  Yes Dennie Bible, NP  empagliflozin (JARDIANCE) 10 MG TABS tablet 1/2 tablet daily Patient taking differently: Take 10 mg by mouth daily.  08/22/17  Yes Tysinger, Camelia Eng, PA-C  flecainide (TAMBOCOR) 150 MG tablet TAKE 1/2 TABLET BY MOUTH TWICE DAILY 08/08/16  Yes Evans Lance, MD  insulin degludec (TRESIBA FLEXTOUCH) 100 UNIT/ML SOPN FlexTouch Pen Inject 0.15 mLs (15 Units total) daily into the skin. 10/06/17  Yes Tysinger, Camelia Eng, PA-C  JARDIANCE 10 MG TABS tablet TAKE 1 TABLET BY MOUTH DAILY 11/10/17  Yes Tysinger, Camelia Eng, PA-C  memantine (NAMENDA) 10 MG tablet TAKE 1 TABLET BY MOUTH TWICE DAILY 10/06/17  Yes Garvin Fila, MD  metFORMIN (GLUCOPHAGE) 850 MG tablet Take 1 tablet (850 mg total) by mouth 2 (two) times daily with a meal. 08/22/17  Yes Tysinger, Camelia Eng, PA-C  Misc Natural Products (OSTEO BI-FLEX ADV JOINT SHIELD PO) Take 1 tablet by mouth daily.    Yes [provider]  POLY-IRON 150 150 MG capsule TAKE 1 CAPSULE(150 MG) BY MOUTH DAILY 12/03/17  Yes Tysinger, Camelia Eng, PA-C  rosuvastatin (CRESTOR) 10 MG tablet Take 1 tablet (10 mg total) by mouth at bedtime. 08/22/17 08/22/18 Yes Tysinger, Camelia Eng,  PA-C  tamsulosin (FLOMAX) 0.4 MG CAPS capsule TAKE 1 CAPSULE(0.4 MG) BY MOUTH DAILY 11/27/17  Yes Tysinger, Camelia Eng, PA-C  XARELTO 20 MG TABS tablet TAKE 1 TABLET(20 MG) BY MOUTH DAILY WITH SUPPER 11/27/17  Yes Tysinger, Camelia Eng, PA-C  flecainide (TAMBOCOR) 150 MG tablet TAKE 1/2 TABLET BY MOUTH TWICE DAILY Patient not taking: Reported on 12/16/2017 06/30/17   Evans Lance, MD  insulin degludec (TRESIBA) 100 UNIT/ML SOPN FlexTouch Pen Inject 0.15 mLs (15 Units total) daily at 10 pm into the skin. Patient not taking: Reported on 12/16/2017 10/06/17   Tysinger, Camelia Eng, PA-C  Insulin Pen Needle (BD PEN NEEDLE NANO U/F) 32G X 4 MM MISC 1 each by Does not apply route daily.  DX E11.9 07/09/17   Tysinger, Camelia Eng, PA-C  INVOKAMET [phone] MG TABS TAKE 1 TABLET BY MOUTH TWICE DAILY Patient not taking: Reported on 12/16/2017 11/27/17   Carlena Hurl, PA-C  metFORMIN (GLUCOPHAGE) 850 MG tablet TAKE 1 TABLET BY MOUTH TWICE DAILY WITH MEALS Patient not taking: Reported on 12/16/2017 12/02/17   Tysinger, Camelia Eng, PA-C  tamsulosin (FLOMAX) 0.4 MG CAPS capsule Take 1 capsule (0.4 mg total) by mouth daily. Patient not taking: Reported on 12/16/2017 08/22/17   Carlena Hurl, PA-C  TRUE METRIX BLOOD GLUCOSE TEST test strip TEST TWICE DAILY AS DIRECTED 10/22/17   Tysinger, Camelia Eng, PA-C  XARELTO 20 MG TABS tablet TAKE 1 TABLET(20 MG) BY MOUTH DAILY WITH SUPPER Patient not taking: Reported on 12/16/2017 11/27/17   Evans Lance, MD    Scheduled Meds: . aspirin  300 mg Rectal Daily   Or  . aspirin  325 mg Oral Daily  . diltiazem  30 mg Oral BID  . donepezil  10 mg Oral QHS  . flecainide  75 mg Oral BID  . folic acid  1 mg Intravenous Daily  . memantine  10 mg Oral BID  . rosuvastatin  20 mg Oral QHS  . tamsulosin  0.4 mg Oral QHS  . thiamine  100 mg Intravenous Daily   Continuous Infusions: PRN Meds:.acetaminophen **OR** acetaminophen (TYLENOL) oral liquid 160 mg/5 mL **OR** acetaminophen,  LORazepam     Results for orders placed or performed during the hospital encounter of 12/16/17 (from the past 48 hour(s))  Urine rapid drug screen (hosp performed)     Status: None   Collection Time: 12/16/17  7:01 PM  Result Value Ref Range   Opiates NONE DETECTED NONE DETECTED   Cocaine NONE DETECTED NONE DETECTED   Benzodiazepines NONE DETECTED NONE DETECTED   Amphetamines NONE DETECTED NONE DETECTED   Tetrahydrocannabinol NONE DETECTED NONE DETECTED   Barbiturates NONE DETECTED NONE DETECTED    Comment: (NOTE) DRUG SCREEN FOR MEDICAL PURPOSES ONLY.  IF CONFIRMATION IS NEEDED FOR ANY PURPOSE, NOTIFY LAB WITHIN 5 DAYS. LOWEST DETECTABLE LIMITS FOR URINE DRUG SCREEN Drug Class                     Cutoff (ng/mL) Amphetamine and metabolites    1000 Barbiturate and metabolites    200 Benzodiazepine                 096 Tricyclics and metabolites     300 Opiates and metabolites        300 Cocaine and metabolites        300 THC                            50   Urinalysis, Routine w reflex microscopic     Status: Abnormal   Collection Time: 12/16/17  7:01 PM  Result Value Ref Range   Color, Urine YELLOW YELLOW   APPearance CLEAR CLEAR   Specific Gravity, Urine 1.035 (H) 1.005 - 1.030   pH 5.0 5.0 - 8.0   Glucose, UA >=500 (A) NEGATIVE mg/dL   Hgb urine dipstick NEGATIVE NEGATIVE   Bilirubin Urine NEGATIVE NEGATIVE   Ketones, ur 20 (A) NEGATIVE mg/dL   Protein, ur 30 (A) NEGATIVE mg/dL   Nitrite NEGATIVE NEGATIVE   Leukocytes, UA NEGATIVE NEGATIVE   RBC / HPF NONE SEEN 0 - 5 RBC/hpf   WBC, UA 0-5 0 -  5 WBC/hpf   Bacteria, UA NONE SEEN NONE SEEN   Squamous Epithelial / LPF NONE SEEN NONE SEEN   Mucus PRESENT   CBG monitoring, ED     Status: Abnormal   Collection Time: 12/16/17  7:02 PM  Result Value Ref Range   Glucose-Capillary 169 (H) 65 - 99 mg/dL  Ethanol     Status: Abnormal   Collection Time: 12/16/17  7:04 PM  Result Value Ref Range   Alcohol, Ethyl (B) 211 (H)  <10 mg/dL    Comment:        LOWEST DETECTABLE LIMIT FOR SERUM ALCOHOL IS 10 mg/dL FOR MEDICAL PURPOSES ONLY   Protime-INR     Status: None   Collection Time: 12/16/17  7:04 PM  Result Value Ref Range   Prothrombin Time 13.4 11.4 - 15.2 seconds   INR 1.03   APTT     Status: Abnormal   Collection Time: 12/16/17  7:04 PM  Result Value Ref Range   aPTT 38 (H) 24 - 36 seconds    Comment:        IF BASELINE aPTT IS ELEVATED, SUGGEST PATIENT RISK ASSESSMENT BE USED TO DETERMINE APPROPRIATE ANTICOAGULANT THERAPY.   CBC     Status: Abnormal   Collection Time: 12/16/17  7:04 PM  Result Value Ref Range   WBC 10.9 (H) 4.0 - 10.5 K/uL   RBC 5.22 4.22 - 5.81 MIL/uL   Hemoglobin 15.6 13.0 - 17.0 g/dL   HCT 48.2 39.0 - 52.0 %   MCV 92.3 78.0 - 100.0 fL   MCH 29.9 26.0 - 34.0 pg   MCHC 32.4 30.0 - 36.0 g/dL   RDW 12.7 11.5 - 15.5 %   Platelets 252 150 - 400 K/uL  Differential     Status: Abnormal   Collection Time: 12/16/17  7:04 PM  Result Value Ref Range   Neutrophils Relative % 87 %   Neutro Abs 9.5 (H) 1.7 - 7.7 K/uL   Lymphocytes Relative 8 %   Lymphs Abs 0.9 0.7 - 4.0 K/uL   Monocytes Relative 3 %   Monocytes Absolute 0.3 0.1 - 1.0 K/uL   Eosinophils Relative 1 %   Eosinophils Absolute 0.1 0.0 - 0.7 K/uL   Basophils Relative 1 %   Basophils Absolute 0.1 0.0 - 0.1 K/uL  Comprehensive metabolic panel     Status: Abnormal   Collection Time: 12/16/17  7:04 PM  Result Value Ref Range   Sodium 143 135 - 145 mmol/L   Potassium 4.0 3.5 - 5.1 mmol/L   Chloride 105 101 - 111 mmol/L   CO2 18 (L) 22 - 32 mmol/L   Glucose, Bld 175 (H) 65 - 99 mg/dL   BUN 13 6 - 20 mg/dL   Creatinine, Ser 0.68 0.61 - 1.24 mg/dL   Calcium 9.3 8.9 - 10.3 mg/dL   Total Protein 7.5 6.5 - 8.1 g/dL   Albumin 4.1 3.5 - 5.0 g/dL   AST 21 15 - 41 U/L   ALT 18 17 - 63 U/L   Alkaline Phosphatase 81 38 - 126 U/L   Total Bilirubin 0.3 0.3 - 1.2 mg/dL   GFR calc non Af Amer >60 >60 mL/min   GFR calc Af Amer  >60 >60 mL/min    Comment: (NOTE) The eGFR has been calculated using the CKD EPI equation. This calculation has not been validated in all clinical situations. eGFR's persistently <60 mL/min signify possible Chronic Kidney Disease.    Anion gap 20 (  H) 5 - 15  Ammonia     Status: None   Collection Time: 12/16/17  7:16 PM  Result Value Ref Range   Ammonia 13 9 - 35 umol/L  I-stat troponin, ED     Status: None   Collection Time: 12/16/17  7:17 PM  Result Value Ref Range   Troponin i, poc 0.00 0.00 - 0.08 ng/mL   Comment 3            Comment: Due to the release kinetics of cTnI, a negative result within the first hours of the onset of symptoms does not rule out myocardial infarction with certainty. If myocardial infarction is still suspected, repeat the test at appropriate intervals.   I-Stat Chem 8, ED     Status: Abnormal   Collection Time: 12/16/17  7:18 PM  Result Value Ref Range   Sodium 142 135 - 145 mmol/L   Potassium 3.9 3.5 - 5.1 mmol/L   Chloride 104 101 - 111 mmol/L   BUN 12 6 - 20 mg/dL   Creatinine, Ser 1.00 0.61 - 1.24 mg/dL   Glucose, Bld 176 (H) 65 - 99 mg/dL   Calcium, Ion 1.14 (L) 1.15 - 1.40 mmol/L   TCO2 18 (L) 22 - 32 mmol/L   Hemoglobin 16.7 13.0 - 17.0 g/dL   HCT 49.0 39.0 - 52.0 %  MRSA PCR Screening     Status: Abnormal   Collection Time: 12/16/17 11:52 PM  Result Value Ref Range   MRSA by PCR INVALID RESULTS, SPECIMEN SENT FOR CULTURE (A) NEGATIVE    Comment: RESULT CALLED TO, READ BACK BY AND VERIFIED WITH: WAGONER,R @ 0515 ON 1.23.19 BY BOWMAN,L        The GeneXpert MRSA Assay (FDA approved for NASAL specimens only), is one component of a comprehensive MRSA colonization surveillance program. It is not intended to diagnose MRSA infection nor to guide or monitor treatment for MRSA infections.   Glucose, capillary     Status: Abnormal   Collection Time: 12/17/17 12:45 AM  Result Value Ref Range   Glucose-Capillary 111 (H) 65 - 99 mg/dL   Hemoglobin A1c     Status: Abnormal   Collection Time: 12/17/17  4:05 AM  Result Value Ref Range   Hgb A1c MFr Bld 8.0 (H) 4.8 - 5.6 %    Comment: (NOTE) Pre diabetes:          5.7%-6.4% Diabetes:              >6.4% Glycemic control for   <7.0% adults with diabetes    Mean Plasma Glucose 182.9 mg/dL    Comment: Performed at [facility] [address], [place], [place] [address]  Lipid panel     Status: Abnormal   Collection Time: 12/17/17  4:05 AM  Result Value Ref Range   Cholesterol 132 0 - 200 mg/dL   Triglycerides 69 <150 mg/dL   HDL 38 (L) >40 mg/dL   Total CHOL/HDL Ratio 3.5 RATIO   VLDL 14 0 - 40 mg/dL   LDL Cholesterol 80 0 - 99 mg/dL    Comment:        Total Cholesterol/HDL:CHD Risk Coronary Heart Disease Risk Table                     Men   Women  1/2 Average Risk   3.4   3.3  Average Risk       5.0   4.4  2 X Average  Risk   9.6   7.1  3 X Average Risk  23.4   11.0        Use the calculated Patient Ratio above and the CHD Risk Table to determine the patient's CHD Risk.        ATP III CLASSIFICATION (LDL):  <100     mg/dL   Optimal  100-129  mg/dL   Near or Above                    Optimal  130-159  mg/dL   Borderline  160-189  mg/dL   High  >190     mg/dL   Very High     Studies/Results:    MRA MRI BRAIN FINDINGS: MRI HEAD FINDINGS  Brain: The diffusion-weighted images demonstrate no acute or subacute infarct. The area of hypoattenuation on the previous CT scan is felt to be artifactual. Mild atrophy is within normal limits for age. No significant white matter disease is present. A single remote lacunar infarct is present in the left cerebellum. The brainstem and cerebellum are otherwise normal. The internal auditory canals are within normal limits.  Vascular: Flow is present in the major intracranial arteries.  Skull and upper cervical spine: The skull base is within normal limits. The craniocervical junction is  normal.  Sinuses/Orbits: The paranasal sinuses are clear. There fluid in the right mastoid air cells. No obstructing nasopharyngeal lesion is present. A right lens replacement is present. Globes and orbits are otherwise within normal limits.  MRA HEAD FINDINGS  The internal carotid arteries are within normal limits from the high cervical segments through the ICA termini. The A1 and M1 segments are normal. The anterior communicating artery is patent. There is some attenuation of distal MCA branch vessels bilaterally without a significant proximal stenosis or occlusion. There is no aneurysm.  The right vertebral artery is dominant. PICA origins are below the field of view. The basilar artery is normal. Both posterior cerebral arteries originate from the basilar tip. There is attenuation of distal small vessels bilaterally.  IMPRESSION: 1. No acute or subacute infarct. 2. Normal MRI appearance brain for age. 3. Mild distal small vessel disease evident on the MRA. No significant proximal stenosis, aneurysm, or branch vessel occlusion.      CAROTID DOPPLERS IMPRESSION: Minor carotid atherosclerosis. No hemodynamically significant ICA stenosis. Degree of narrowing less than 50% bilaterally by ultrasound criteria.  Patent antegrade vertebral flow bilaterally      HEAD CT FINDINGS: Brain: There is mild diffuse atrophy. There is no well-defined mass, hemorrhage, extra-axial fluid collection, or midline shift. There is ill-defined decreased attenuation in the inferior right frontal lobe medial and anterior to the right sylvian fissure. This decreased attenuation involves a portion the right insular cortex along its inferior aspect as well as a portion of the right claustrum inferiorly. This areas felt to be consistent with an acute infarct. No other acute infarct evident.  Vascular: There is no appreciable hyperdense vessel. There is no appreciable vascular  calcification.  Skull: Bony calvarium appears intact.  Sinuses/Orbits: There is mucosal thickening in several ethmoid air cells bilaterally. Other visualized paranasal sinuses are clear. Orbits appear symmetric bilaterally except for apparent cataract extraction on the right.  Other: There is minimal mastoid disease on the right posteriorly. Mastoids elsewhere clear. There is debris in each external auditory canal.  IMPRESSION: 1. Findings felt to represent acute infarct in the right inferior, posterior frontal lobe and involving a portion of the medial  anterior right temporal lobe near the sylvian fissure on the right.  2.  No mass or hemorrhage.  3.  Mucosal thickening in several ethmoid air cells.  4. Mild right-sided mastoid disease. Probable cerumen in each external auditory canal.       The brain MRI and MRA are reviewed in person.  There no evidence of acute infarct seen on DWI.  SWI shows no hemorrhage.  A FLAIR imaging shows no lesions surprisingly given his age.  There is moderate global atrophy.  MRA shows no large vessel occlusive disease.  CT scan shows moderate atrophy with questionable hypodensity involving the R medial temporal lobe at the junction of the insular cortex but again this was not borne out on MRI.     Gadiel John A. Merlene Laughter, M.D.  Diplomate, Tax adviser of Psychiatry and Neurology ( Neurology). 12/17/2017, 5:47 PM

## 2017-12-17 NOTE — Progress Notes (Signed)
Christian [name] was made a suicide patient.  Reportedly made comments last night in ED.

## 2017-12-17 NOTE — [facility] Counselor (Signed)
TTS notified [place], RN, patient nurse reporting patient is psych-cleared.

## 2017-12-17 NOTE — Evaluation (Addendum)
Physical Therapy Evaluation Patient Details Name: DELYNN PURSLEY MRN: [id number] DOB: 1941/02/27 Today's Date: 12/17/2017   History of Present Illness  Christian [name]  is a 77 y.o. male, h/o PE/ DVT, (prothrombin gene mutation), hypertension, hyperlipidemia, ? Diastolic CHF, palitations on flecainide, cognitive decline who apparently was last seen well by wife around 10 am when she left for work.  He wasn't answering his phone so she had a friend check on him and he was unresponsive and EMS summoned.  Pt time EMS arrived he was responding but with slurred speech,   There was some question of right facial droop.  Pt was drinking 1.5 pints of liquor today because he felt lonely.      Clinical Impression  Patient demonstrates wide base of support and very unsteady when taking steps, at risk for falls and limited to walking around bed due to fatigue and generalized weakness.  Patient tolerated sitting up in chair with sitter in room after therapy.  Patient will benefit from continued physical therapy in hospital and recommended venue below to increase strength, balance, endurance for safe ADLs and gait.    Follow Up Recommendations SNF;Supervision/Assistance - 24 hour    Equipment Recommendations  Rolling walker with 5" wheels    Recommendations for Other Services       Precautions / Restrictions Precautions Precautions: Fall Restrictions Weight Bearing Restrictions: No      Mobility  Bed Mobility Overal bed mobility: Needs Assistance Bed Mobility: Supine to Sit     Supine to sit: Min assist        Transfers Overall transfer level: Needs assistance Equipment used: Rolling walker (2 wheeled) Transfers: Sit to/from [facility] Sit to Stand: Min assist Stand pivot transfers: Min assist       General transfer comment: demonstrates slow labored movement  Ambulation/Gait Ambulation/Gait assistance: Mod assist Ambulation Distance (Feet): 15 Feet Assistive  device: Rolling walker (2 wheeled) Gait Pattern/deviations: Decreased step length - right;Decreased step length - left;Decreased stride length;Ataxic   Gait velocity interpretation: Below normal speed for age/gender General Gait Details: demonstrates slow labored movement with wide base of support, ataxic like cadence required VC's to step closer to RW, once fatigued pushed walker farther away  Stairs            Wheelchair Mobility    Modified Rankin (Stroke Patients Only)       Balance Overall balance assessment: Needs assistance Sitting-balance support: Feet supported;No upper extremity supported Sitting balance-Leahy Scale: Good     Standing balance support: Bilateral upper extremity supported;During functional activity Standing balance-Leahy Scale: Poor Standing balance comment: fair/poor with RW                             Pertinent Vitals/Pain Pain Assessment: No/denies pain    Home Living Family/patient expects to be discharged to:: Private residence Living Arrangements: Spouse/significant other Available Help at Discharge: Family Type of Home: House Home Access: Stairs to enter Entrance Stairs-Rails: None Entrance Stairs-Number of Steps: 1 Home Layout: Multi-level Home Equipment: Cane - single point;Shower seat - built in      Prior Function Level of Independence: Independent               Journalist, newspaper        Extremity/Trunk Assessment   Upper Extremity Assessment Upper Extremity Assessment: Generalized weakness    Lower Extremity Assessment Lower Extremity Assessment: Generalized weakness    Cervical / Trunk  Assessment Cervical / Trunk Assessment: Normal  Communication   Communication: No difficulties  Cognition Arousal/Alertness: Awake/alert Behavior During Therapy: WFL for tasks assessed/performed Overall Cognitive Status: Within Functional Limits for tasks assessed                                         General Comments      Exercises     Assessment/Plan    PT Assessment Patient needs continued PT services  PT Problem List Decreased strength;Decreased activity tolerance;Decreased balance;Decreased mobility       PT Treatment Interventions Gait training;Functional mobility training;Therapeutic activities;Therapeutic exercise;Patient/family education    PT Goals (Current goals can be found in the Care Plan section)  Acute Rehab PT Goals Patient Stated Goal: return home with his spouse to assist PT Goal Formulation: With patient Time For Goal Achievement: 12/24/17 Potential to Achieve Goals: Good    Frequency Min 3X/week   Barriers to discharge        Co-evaluation               AM-PAC PT "6 Clicks" Daily Activity  Outcome Measure Difficulty turning over in bed (including adjusting bedclothes, sheets and blankets)?: A Little Difficulty moving from lying on back to sitting on the side of the bed? : A Lot Difficulty sitting down on and standing up from a chair with arms (e.g., wheelchair, bedside commode, etc,.)?: A Lot Help needed moving to and from a bed to chair (including a wheelchair)?: A Lot Help needed walking in hospital room?: A Lot Help needed climbing 3-5 steps with a railing? : Total 6 Click Score: 12    End of Session   Activity Tolerance: Patient tolerated treatment well;Patient limited by fatigue Patient left: in chair;with call bell/phone within reach;with nursing/sitter in room Nurse Communication: Mobility status PT Visit Diagnosis: Unsteadiness on feet (R26.81);Other abnormalities of gait and mobility (R26.89);Muscle weakness (generalized) (M62.81)    Time: 0177-9390 PT Time Calculation (min) (ACUTE ONLY): 26 min   Charges:   PT Evaluation $PT Eval Moderate Complexity: 1 Mod PT Treatments $Therapeutic Activity: 23-37 mins   PT G Codes:        2:47 PM, 01/16/2018 Lonell Grandchild, MPT Physical Therapist with [facility] [phone] office [phone] mobile phone

## 2017-12-17 NOTE — Plan of Care (Signed)
  Acute Rehab PT Goals(only PT should resolve) Pt Will Go Supine/Side To Sit 12/17/2017 1444 - Progressing by Lonell Grandchild, PT Flowsheets Taken 12/17/2017 1444  Pt will go Supine/Side to Sit with supervision Patient Will Transfer Sit To/From Stand 12/17/2017 1444 - Progressing by Lonell Grandchild, PT Flowsheets Taken 12/17/2017 1444  Patient will transfer sit to/from stand with min guard assist Pt Will Transfer Bed To Chair/Chair To Bed 12/17/2017 1444 - Progressing by Lonell Grandchild, PT Flowsheets Taken 12/17/2017 1444  Pt will Transfer Bed to Chair/Chair to Bed min guard assist Pt Will Ambulate 12/17/2017 1444 - Progressing by Lonell Grandchild, PT Flowsheets Taken 12/17/2017 1444  Pt will Ambulate with minimal assist;with rolling walker;50 feet  2:44 PM, 12/17/17 Lonell Grandchild, MPT Physical Therapist with [facility] [phone] office [phone] mobile phone

## 2017-12-17 NOTE — H&P (Signed)
[place] for Xarelto (home med) Indication: Hx pulmonary embolus and DVT  Allergies  Allergen Reactions  . Bee Venom Swelling  . Ultram [Tramadol Hcl]     Makes him wired, gives insomnia  . Oxycodone Other (See Comments)    Mental status changes  . Oxycontin [Oxycodone Hcl] Other (See Comments)    Mental status changes per pt   Patient Measurements: Height: 5\' 11"  (180.3 cm) Weight: 214 lb 4.6 oz (97.2 kg) IBW/kg (Calculated) : 75.3 Heparin Dosing Weight:   Vital Signs: Temp: 98 F (36.7 C) (01/23 2000) Temp Source: Oral (01/23 2000) BP: 137/62 (01/23 1900) Pulse Rate: 70 (01/23 1900)  Labs: Recent Labs    12/16/17 1904 12/16/17 1918  HGB 15.6 16.7  HCT 48.2 49.0  PLT 252  --   APTT 38*  --   LABPROT 13.4  --   INR 1.03  --   CREATININE 0.68 1.00    Estimated Creatinine Clearance: 77.1 mL/min (by C-G formula based on SCr of 1 mg/dL).   Medical History: Past Medical History:  Diagnosis Date  . Agitation   . Arthritis   . Cataract   . Chronic back pain   . Confusion   . Diabetes ([place])    type 2  . DVT (deep venous thrombosis) ([place]) 01/2015  . Hearing impaired   . High cholesterol   . Hypertension   . Insomnia   . Insomnia   . Leg pain, diffuse   . Leg swelling   . Memory loss   . Mild cognitive impairment    sees [facility] Neurology  . Numbness    fingers, feet, toes  . OSA on CPAP   . 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 06/14/2015  . Pulmonary embolism ([place]) 01/2015  . Spinal stenosis    lumbar  . Tremor    on propranolol  . Wears glasses    Medications:  Medications Prior to Admission  Medication Sig Dispense Refill Last Dose  . budesonide-formoterol (SYMBICORT) 160-4.5 MCG/ACT inhaler Inhale 2 puffs into the lungs 2 (two) times daily. (Patient taking differently: Inhale 2 puffs into the lungs daily as needed (for shortness of breath). ) 1 Inhaler 3  unknown  . diltiazem (CARDIZEM) 30 MG tablet TAKE 1 TABLET BY MOUTH TWICE DAILY 60 tablet 6 12/16/2017 at Unknown time  . donepezil (ARICEPT) 10 MG tablet Take 1 tablet (10 mg total) by mouth at bedtime. 30 tablet 6 12/15/2017 at Unknown time  . empagliflozin (JARDIANCE) 10 MG TABS tablet 1/2 tablet daily (Patient taking differently: Take 10 mg by mouth daily. ) 90 tablet 1 12/16/2017 at Unknown time  . flecainide (TAMBOCOR) 150 MG tablet TAKE 1/2 TABLET BY MOUTH TWICE DAILY 90 tablet 3 12/16/2017 at Unknown time  . insulin degludec (TRESIBA FLEXTOUCH) 100 UNIT/ML SOPN FlexTouch Pen Inject 0.15 mLs (15 Units total) daily into the skin. 5 pen 0 12/15/2017 at Unknown time  . JARDIANCE 10 MG TABS tablet TAKE 1 TABLET BY MOUTH DAILY 60 tablet 0 12/16/2017 at Unknown time  . memantine (NAMENDA) 10 MG tablet TAKE 1 TABLET BY MOUTH TWICE DAILY 60 tablet 5 12/16/2017 at Unknown time  . metFORMIN (GLUCOPHAGE) 850 MG tablet Take 1 tablet (850 mg total) by mouth 2 (two) times daily with a meal. 180 tablet 3 12/16/2017 at Unknown time  . Misc Natural Products (OSTEO BI-FLEX ADV JOINT SHIELD PO) Take 1 tablet by mouth daily.  12/16/2017 at Unknown time  . POLY-IRON 150 150 MG capsule TAKE 1 CAPSULE(150 MG) BY MOUTH DAILY 90 capsule 0 12/16/2017 at Unknown time  . rosuvastatin (CRESTOR) 10 MG tablet Take 1 tablet (10 mg total) by mouth at bedtime. 90 tablet 1 12/15/2017 at Unknown time  . tamsulosin (FLOMAX) 0.4 MG CAPS capsule TAKE 1 CAPSULE(0.4 MG) BY MOUTH DAILY 30 capsule 0 12/16/2017 at Unknown time  . XARELTO 20 MG TABS tablet TAKE 1 TABLET(20 MG) BY MOUTH DAILY WITH SUPPER 90 tablet 0 12/15/2017 at Unknown time  . flecainide (TAMBOCOR) 150 MG tablet TAKE 1/2 TABLET BY MOUTH TWICE DAILY (Patient not taking: Reported on 12/16/2017) 90 tablet 2 Not Taking at Unknown time  . insulin degludec (TRESIBA) 100 UNIT/ML SOPN FlexTouch Pen Inject 0.15 mLs (15 Units total) daily at 10 pm into the skin. (Patient not taking: Reported  on 12/16/2017) 6 mL 2 Not Taking at Unknown time  . Insulin Pen Needle (BD PEN NEEDLE NANO U/F) 32G X 4 MM MISC 1 each by Does not apply route daily. DX E11.9 100 each 3 Taking  . INVOKAMET [phone] MG TABS TAKE 1 TABLET BY MOUTH TWICE DAILY (Patient not taking: Reported on 12/16/2017) 180 tablet 0 Not Taking at Unknown time  . metFORMIN (GLUCOPHAGE) 850 MG tablet TAKE 1 TABLET BY MOUTH TWICE DAILY WITH MEALS (Patient not taking: Reported on 12/16/2017) 180 tablet 0 Not Taking at Unknown time  . tamsulosin (FLOMAX) 0.4 MG CAPS capsule Take 1 capsule (0.4 mg total) by mouth daily. (Patient not taking: Reported on 12/16/2017) 90 capsule 3 Not Taking at Unknown time  . TRUE METRIX BLOOD GLUCOSE TEST test strip TEST TWICE DAILY AS DIRECTED 100 each 1    Assessment: Okay for Protocol, neuro workup completed.  CVA ruled out, CT of the head was reviewed, MRI of the head was negative, Will resume home dose of Xarelto (patient has been on it due to history of PE)  Goal of Therapy:  Systemic Anticoagulation.    Plan:  Xarelto 20mg  PO daily. Monitor for signs and symptoms of bleeding.  Sign off - will monitor Xarelto per standard inpatient procedure.  Biagio Quint R 12/17/2017,9:59 PM

## 2017-12-17 NOTE — BH Assessment (Signed)
Tele Assessment Note   Patient Name: Christian [name] MRN: [id number] Referring Physician:  Deatra James, MD Location of Patient: [facility] Location of Provider: [place]  Christian [name] is an 77 y.o. male present to [facility] via ambulance after being found in his home unresponsive. Patient report,"I done something stupid." Patient expressed he drank to much alcohol. When asked why was you drinking patient replied, "Because I was bored." Patient report he started drinking alcohol at 77 year old after learning he has the onset of dementia. Report he drinks once every 2 or 3 weeks. Patient denies history of substance abuse or mental health issues.   Patient denies suicidal / homicidal ideations or auditory / visual hallucinations. Denies feelings of paranoia. Denies history of trauma or access to weapons.   Diagnosis:F10.20    Alcohol use disorder, Severe  Disposition: Per Shuvon Rankin, NP, patient does not meet criteria for inpatient. Patient recommended for discharge.   Past Medical History:  Past Medical History:  Diagnosis Date  . Agitation   . Arthritis   . Cataract   . Chronic back pain   . Confusion   . Diabetes ([place])    type 2  . DVT (deep venous thrombosis) ([place]) 01/2015  . Hearing impaired   . High cholesterol   . Hypertension   . Insomnia   . Insomnia   . Leg pain, diffuse   . Leg swelling   . Memory loss   . Mild cognitive impairment    sees [facility] Neurology  . Numbness    fingers, feet, toes  . OSA on CPAP   . 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 06/14/2015  . Pulmonary embolism ([place]) 01/2015  . Spinal stenosis    lumbar  . Tremor    on propranolol  . Wears glasses     Past Surgical History:  Procedure Laterality Date  . CATARACT EXTRACTION    . COLONOSCOPY  2014  . TONSILLECTOMY      Family History:  Family History  Problem Relation Age of Onset  . Dementia Father   .  Clotting disorder Mother   . Heart disease Brother   . Clotting disorder Brother   . Psychiatric Illness Sister   . Leukemia Sister     Social History:  reports that he quit smoking about 13 years ago. His smoking use included cigarettes. He has a 10.00 pack-year smoking history. he has never used smokeless tobacco. He reports that he drinks alcohol. He reports that he does not use drugs.  Additional Social History:  Alcohol / Drug Use Pain Medications: see MAR Prescriptions: see MAR Over the Counter: see MAR History of alcohol / drug use?: Yes Substance #1 Name of Substance 1: Alcohol 1 - Age of First Use: 76 1 - Amount (size/oz): unknown 1 - Frequency: once every 2 or 3 weeks 1 - Duration: ongoing 1 - Last Use / Amount: 12/16/2017  CIWA: CIWA-Ar BP: (!) 139/56 Pulse Rate: 91 Nausea and Vomiting: no nausea and no vomiting Tactile Disturbances: none Tremor: no tremor Auditory Disturbances: not present Paroxysmal Sweats: no sweat visible Visual Disturbances: not present Anxiety: no anxiety, at ease Headache, Fullness in Head: none present Agitation: normal activity Orientation and Clouding of Sensorium: oriented and can do serial additions CIWA-Ar Total: 0 COWS:    Allergies:  Allergies  Allergen Reactions  . Bee Venom Swelling  . Ultram [Tramadol Hcl]     Makes him wired, gives  insomnia  . Oxycodone Other (See Comments)    Mental status changes  . Oxycontin [Oxycodone Hcl] Other (See Comments)    Mental status changes per pt    Home Medications:  Medications Prior to Admission  Medication Sig Dispense Refill  . budesonide-formoterol (SYMBICORT) 160-4.5 MCG/ACT inhaler Inhale 2 puffs into the lungs 2 (two) times daily. (Patient taking differently: Inhale 2 puffs into the lungs daily as needed (for shortness of breath). ) 1 Inhaler 3  . diltiazem (CARDIZEM) 30 MG tablet TAKE 1 TABLET BY MOUTH TWICE DAILY 60 tablet 6  . donepezil (ARICEPT) 10 MG tablet Take 1 tablet  (10 mg total) by mouth at bedtime. 30 tablet 6  . empagliflozin (JARDIANCE) 10 MG TABS tablet 1/2 tablet daily (Patient taking differently: Take 10 mg by mouth daily. ) 90 tablet 1  . flecainide (TAMBOCOR) 150 MG tablet TAKE 1/2 TABLET BY MOUTH TWICE DAILY 90 tablet 3  . insulin degludec (TRESIBA FLEXTOUCH) 100 UNIT/ML SOPN FlexTouch Pen Inject 0.15 mLs (15 Units total) daily into the skin. 5 pen 0  . JARDIANCE 10 MG TABS tablet TAKE 1 TABLET BY MOUTH DAILY 60 tablet 0  . memantine (NAMENDA) 10 MG tablet TAKE 1 TABLET BY MOUTH TWICE DAILY 60 tablet 5  . metFORMIN (GLUCOPHAGE) 850 MG tablet Take 1 tablet (850 mg total) by mouth 2 (two) times daily with a meal. 180 tablet 3  . Misc Natural Products (OSTEO BI-FLEX ADV JOINT SHIELD PO) Take 1 tablet by mouth daily.     [name] POLY-IRON 150 150 MG capsule TAKE 1 CAPSULE(150 MG) BY MOUTH DAILY 90 capsule 0  . rosuvastatin (CRESTOR) 10 MG tablet Take 1 tablet (10 mg total) by mouth at bedtime. 90 tablet 1  . tamsulosin (FLOMAX) 0.4 MG CAPS capsule TAKE 1 CAPSULE(0.4 MG) BY MOUTH DAILY 30 capsule 0  . XARELTO 20 MG TABS tablet TAKE 1 TABLET(20 MG) BY MOUTH DAILY WITH SUPPER 90 tablet 0  . flecainide (TAMBOCOR) 150 MG tablet TAKE 1/2 TABLET BY MOUTH TWICE DAILY (Patient not taking: Reported on 12/16/2017) 90 tablet 2  . insulin degludec (TRESIBA) 100 UNIT/ML SOPN FlexTouch Pen Inject 0.15 mLs (15 Units total) daily at 10 pm into the skin. (Patient not taking: Reported on 12/16/2017) 6 mL 2  . Insulin Pen Needle (BD PEN NEEDLE NANO U/F) 32G X 4 MM MISC 1 each by Does not apply route daily. DX E11.9 100 each 3  . INVOKAMET [phone] MG TABS TAKE 1 TABLET BY MOUTH TWICE DAILY (Patient not taking: Reported on 12/16/2017) 180 tablet 0  . metFORMIN (GLUCOPHAGE) 850 MG tablet TAKE 1 TABLET BY MOUTH TWICE DAILY WITH MEALS (Patient not taking: Reported on 12/16/2017) 180 tablet 0  . tamsulosin (FLOMAX) 0.4 MG CAPS capsule Take 1 capsule (0.4 mg total) by mouth daily. (Patient  not taking: Reported on 12/16/2017) 90 capsule 3  . TRUE METRIX BLOOD GLUCOSE TEST test strip TEST TWICE DAILY AS DIRECTED 100 each 1  . XARELTO 20 MG TABS tablet TAKE 1 TABLET(20 MG) BY MOUTH DAILY WITH SUPPER (Patient not taking: Reported on 12/16/2017) 30 tablet 4    OB/GYN Status:  No LMP for male patient.  General Assessment Data Location of Assessment: [facility] TTS Assessment: In system Is this a Tele or Face-to-Face Assessment?: Tele Assessment Is this an Initial Assessment or a Re-assessment for this encounter?: Initial Assessment Marital status: Married [place] name: n/a Is patient pregnant?: No Pregnancy Status: No Living Arrangements: Spouse/significant other Can pt  return to current living arrangement?: Yes Admission Status: Voluntary Is patient capable of signing voluntary admission?: Yes Referral Source: Self/Family/Friend Insurance type: Medicare     Crisis Care Plan Living Arrangements: Spouse/significant other Name of Psychiatrist: denies Name of Therapist: denies  Education Status Highest grade of school patient has completed: High School Diploma  Risk to self with the past 6 months Suicidal Ideation: No Has patient been a risk to self within the past 6 months prior to admission? : No Suicidal Intent: No Has patient had any suicidal intent within the past 6 months prior to admission? : No Is patient at risk for suicide?: No Suicidal Plan?: No Has patient had any suicidal plan within the past 6 months prior to admission? : No Access to Means: No What has been your use of drugs/alcohol within the last 12 months?: alcohol Previous Attempts/Gestures: No How many times?: 0 Other Self Harm Risks: none report Triggers for Past Attempts: None known Intentional Self Injurious Behavior: None Family Suicide History: Unknown Recent stressful life event(s): (medical - onset of dementia) Persecutory voices/beliefs?: No Depression: No Substance abuse history and/or  treatment for substance abuse?: No Suicide prevention information given to non-admitted patients: Not applicable  Risk to Others within the past 6 months Homicidal Ideation: No Does patient have any lifetime risk of violence toward others beyond the six months prior to admission? : No Thoughts of Harm to Others: No Current Homicidal Intent: No Current Homicidal Plan: No Access to Homicidal Means: No Identified Victim: n/a History of harm to others?: No Assessment of Violence: None Noted Violent Behavior Description: none noted Does patient have access to weapons?: No Criminal Charges Pending?: No Does patient have a court date: No Is patient on probation?: No  Psychosis Hallucinations: None noted Delusions: None noted  Mental Status Report Appearance/Hygiene: In scrubs Eye Contact: Fair Motor Activity: Freedom of movement Speech: Logical/coherent Level of Consciousness: Alert, Quiet/awake Mood: Pleasant Affect: Appropriate to circumstance Anxiety Level: None Thought Processes: Coherent Judgement: Unimpaired Orientation: Person, Place, Time, Situation Obsessive Compulsive Thoughts/Behaviors: None  Cognitive Functioning Concentration: Normal Memory: Recent Intact, Remote Intact IQ: Average Insight: Good Impulse Control: Fair Appetite: Fair Weight Loss: 0 Weight Gain: 0 Sleep: Unable to Assess Vegetative Symptoms: None  ADLScreening [facility] Assessment Services) Patient's cognitive ability adequate to safely complete daily activities?: Yes Patient able to express need for assistance with ADLs?: Yes Independently performs ADLs?: Yes (appropriate for developmental age)  Prior Inpatient Therapy Prior Inpatient Therapy: No  Prior Outpatient Therapy Prior Outpatient Therapy: No Does patient have an ACCT team?: No Does patient have Intensive In-House Services?  : No Does patient have [facility] services? : No Does patient have P4CC services?: No  ADL Screening  (condition at time of admission) Patient's cognitive ability adequate to safely complete daily activities?: Yes Is the patient deaf or have difficulty hearing?: No Does the patient have difficulty seeing, even when wearing glasses/contacts?: No Does the patient have difficulty concentrating, remembering, or making decisions?: No Patient able to express need for assistance with ADLs?: Yes Does the patient have difficulty dressing or bathing?: No Independently performs ADLs?: Yes (appropriate for developmental age) Does the patient have difficulty walking or climbing stairs?: No Weakness of Legs: None Weakness of Arms/Hands: None  Home Assistive Devices/Equipment Home Assistive Devices/Equipment: CBG Meter, Eyeglasses, CPAP, Hearing aid  Therapy Consults (therapy consults require a physician order) PT Evaluation Needed: No OT Evalulation Needed: No SLP Evaluation Needed: Yes (Comment) Abuse/Neglect Assessment (Assessment to be complete while patient  is alone) Abuse/Neglect Assessment Can Be Completed: Yes Physical Abuse: Denies Verbal Abuse: Denies Sexual Abuse: Denies Exploitation of patient/patient's resources: Denies Self-Neglect: Denies Values / Beliefs Cultural Requests During Hospitalization: None Spiritual Requests During Hospitalization: None Consults Spiritual Care Consult Needed: No Social Work Consult Needed: No Regulatory affairs officer (For Healthcare) Does Patient Have a Medical Advance Directive?: No Would patient like information on creating a medical advance directive?: No - Patient declined Nutrition Screen- [facility] Patient's home diet: Regular Has the patient recently lost weight without trying?: No Has the patient been eating poorly because of a decreased appetite?: No Malnutrition Screening Tool Score: 0  Additional Information 1:1 In Past 12 Months?: No CIRT Risk: No Elopement Risk: No Does patient have medical clearance?: No     Disposition: Per  Shuvon Rankin, NP, patient does not meet criteria for inpatient. Patient recommended for discharge.  Disposition Initial Assessment Completed for this Encounter: Yes Disposition of Patient: Discharge with Outpatient Resources   Vondell Sowell [facility] 12/17/2017 12:03 PM

## 2017-12-17 NOTE — Progress Notes (Deleted)
Sarah with [employer] notified AC @1539 that patient Christian [name] DOB 03/18/1956 has a positive AFB result. Dr. Hernandez notified and discussed case with Dr. Van Damme. Dr. Hernandez requests patient be notified to stay at home until more tests are resulted tomorrow in case this is TB. Tiffany Vogler AC spoke with patient at 1622 to advise the patient of his results and to stay at home and not go out and anyone else in the household should stay at home until Dr. Van Damme calls him with the results tomorrow.   Lab test [facility] number is #[id number] 

## 2017-12-18 ENCOUNTER — Inpatient Hospital Stay (HOSPITAL_COMMUNITY): Payer: Medicare Other

## 2017-12-18 ENCOUNTER — Telehealth: Payer: Self-pay | Admitting: Medical

## 2017-12-18 DIAGNOSIS — I10 Essential (primary) hypertension: Secondary | ICD-10-CM

## 2017-12-18 DIAGNOSIS — E118 Type 2 diabetes mellitus with unspecified complications: Secondary | ICD-10-CM

## 2017-12-18 DIAGNOSIS — I2692 Saddle embolus of pulmonary artery without acute cor pulmonale: Secondary | ICD-10-CM

## 2017-12-18 DIAGNOSIS — F10929 Alcohol use, unspecified with intoxication, unspecified: Secondary | ICD-10-CM

## 2017-12-18 LAB — ECHOCARDIOGRAM COMPLETE
CHL CUP DOP CALC LVOT VTI: 26 cm
CHL CUP STROKE VOLUME: 56 mL
E/e' ratio: 7.86
EWDT: 303 ms
FS: 39 % (ref 28–44)
Height: 71 in
IV/PV OW: 1.11
LA ID, A-P, ES: 39 mm
LA diam end sys: 39 mm
LA diam index: 1.74 cm/m2
LA vol A4C: 51.5 ml
LAVOL: 45.9 mL
LAVOLIN: 20.5 mL/m2
LDCA: 3.14 cm2
LV E/e' medial: 7.86
LV SIMPSON'S DISK: 62
LV TDI E'LATERAL: 10.4
LV TDI E'MEDIAL: 9.03
LV dias vol index: 40 mL/m2
LV dias vol: 90 mL (ref 62–150)
LV sys vol: 35 mL
LVEEAVG: 7.86
LVELAT: 10.4 cm/s
LVOT diameter: 20 mm
LVOT peak grad rest: 4 mmHg
LVOT peak vel: 106 cm/s
LVOTSV: 82 mL
LVSYSVOLIN: 15 mL/m2
Lateral S' vel: 11.9 cm/s
MV Dec: 303
MVPG: 3 mmHg
MVPKAVEL: 78.3 m/s
MVPKEVEL: 81.7 m/s
PW: 10.5 mm — AB (ref 0.6–1.1)
TAPSE: 20.3 mm
Weight: 3456.81 oz

## 2017-12-18 LAB — CBC
HCT: 41.6 % (ref 39.0–52.0)
Hemoglobin: 13.4 g/dL (ref 13.0–17.0)
MCH: 29.3 pg (ref 26.0–34.0)
MCHC: 32.2 g/dL (ref 30.0–36.0)
MCV: 90.8 fL (ref 78.0–100.0)
Platelets: 232 10*3/uL (ref 150–400)
RBC: 4.58 MIL/uL (ref 4.22–5.81)
RDW: 12.6 % (ref 11.5–15.5)
WBC: 8.2 10*3/uL (ref 4.0–10.5)

## 2017-12-18 LAB — MRSA CULTURE: CULTURE: NOT DETECTED

## 2017-12-18 LAB — GLUCOSE, CAPILLARY: GLUCOSE-CAPILLARY: 223 mg/dL — AB (ref 65–99)

## 2017-12-18 MED ORDER — DILTIAZEM HCL 30 MG PO TABS
30.0000 mg | ORAL_TABLET | Freq: Three times a day (TID) | ORAL | Status: DC
  Administered 2017-12-19 (×2): 30 mg via ORAL
  Filled 2017-12-18 (×4): qty 1

## 2017-12-18 MED ORDER — INSULIN DETEMIR 100 UNIT/ML ~~LOC~~ SOLN
10.0000 [IU] | Freq: Every day | SUBCUTANEOUS | Status: DC
  Administered 2017-12-18: 10 [IU] via SUBCUTANEOUS
  Filled 2017-12-18 (×4): qty 0.1

## 2017-12-18 NOTE — Progress Notes (Signed)
*  PRELIMINARY RESULTS* Echocardiogram 2D Echocardiogram has been performed.  Samuel Germany 12/18/2017, 2:58 PM

## 2017-12-18 NOTE — NC FL2 (Signed)
[place] MEDICAID FL2 LEVEL OF CARE SCREENING TOOL     IDENTIFICATION  Patient Name: Christian [name] Birthdate: 04/08/1941 Sex: male Admission Date (Current Location): 12/16/2017  [facility] and [place] Number:  [employer] and Address:  [place] [address] [place]      Provider Number: [id number]  Attending Physician Name and Address:  Roxan Hockey, MD  Relative Name and Phone Number:  Kou Gucciardo (wife) [phone]    Current Level of Care: Hospital Recommended Level of Care: [place] Prior Approval Number:    Date Approved/Denied:   PASRR Number: pending  Discharge Plan: SNF    Current Diagnoses: Patient Active Problem List   Diagnosis Date Noted  . Stroke ([place]) 12/16/2017  . Alcohol intoxication ([place]) 12/16/2017  . Wears hearing aid 06/09/2017  . Tinnitus of both ears 06/09/2017  . Impacted cerumen of right ear 06/09/2017  . Left wrist pain 06/26/2016  . Morning joint stiffness 06/26/2016  . Polyarthralgia 06/26/2016  . Type 2 diabetes mellitus with complication, without long-term current use of insulin ([place]) 04/02/2016  . Gait disturbance 04/02/2016  . Physical deconditioning 04/02/2016  . Pain of both shoulder joints 04/02/2016  . Leg weakness, bilateral 04/02/2016  . Encounter for therapeutic drug monitoring 11/29/2015  . Encounter for health maintenance examination in adult 10/30/2015  . Shoulder pain, bilateral 10/30/2015  . Muscle weakness 10/30/2015  . Generalized weakness 10/30/2015  . Skin lesion 10/30/2015  . PVC (premature ventricular contraction) 10/10/2015  . Diabetes type 2, controlled ([place]) 06/20/2015  . Decreased energy 06/20/2015  . Pulmonary embolism ([place]) 06/20/2015  . DVT (deep venous thrombosis) ([place]) 06/20/2015  . Hammer toe of left foot 06/20/2015  . Pre-ulcerative calluses 06/20/2015  . Hypertrophic toenail 06/20/2015  . Diabetic mononeuropathy associated with  diabetes mellitus due to underlying condition ([place]) 06/20/2015  . Leg pain, bilateral 06/20/2015  . Chronic low back pain 06/20/2015  . 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 06/14/2015  . Diabetes type 2, uncontrolled ([place]) 03/06/2015  . OSA on CPAP 03/06/2015  . Edema 03/06/2015  . Leg pain, diffuse 03/06/2015  . Bilateral low back pain without sciatica 03/06/2015  . Tremor 03/06/2015  . Insomnia 03/06/2015  . Vaccine counseling 03/06/2015  . Chronic back pain 03/06/2015  . Wears glasses 03/06/2015  . Hearing loss 03/06/2015  . Spinal stenosis of lumbar region 03/06/2015  . Ptosis 03/06/2015  . Pulmonary emboli ([place]) 03/06/2015  . Positive depression screening 03/06/2015  . Advance directive discussed with patient 03/06/2015  . Bradycardia 02/27/2015  . Long-term (current) use of anticoagulants   . Dementia   . Essential hypertension 02/08/2015  . Hyperlipidemia 02/08/2015  . SOB (shortness of breath) 02/08/2015  . OSA (obstructive sleep apnea) 02/08/2015  . Mild cognitive impairment 02/08/2015  . PE (pulmonary embolism) 01/31/2015    Orientation RESPIRATION BLADDER Height & Weight     Self, Time, Situation, Place  Normal Continent Weight: 215 lb 9.8 oz (97.8 kg) Height:  5\' 11"  (180.3 cm)  BEHAVIORAL SYMPTOMS/MOOD NEUROLOGICAL BOWEL NUTRITION STATUS      Continent Diet(carb modified)  AMBULATORY STATUS COMMUNICATION OF NEEDS Skin   Extensive Assist Verbally Normal                       Personal Care Assistance Level of Assistance  Bathing, Feeding, Dressing Bathing Assistance: Limited assistance Feeding assistance: Independent Dressing Assistance: Limited assistance  Functional Limitations Info  Sight, Hearing, Speech Sight Info: Adequate Hearing Info: Adequate Speech Info: Adequate    SPECIAL CARE FACTORS FREQUENCY  PT (By licensed PT)     PT Frequency: 5 times week              Contractures  Contractures Info: Not present    Additional Factors Info  Code Status, Allergies Code Status Info: full Allergies Info: bee venom, ultram, oxycodone, oxycontin           Current Medications (12/18/2017):  This is the current hospital active medication list Current Facility-Administered Medications  Medication Dose Route Frequency Provider Last Rate Last Dose  . acetaminophen (TYLENOL) tablet 650 mg  650 mg Oral Q4H PRN Jani Gravel, MD   650 mg at 12/17/17 1015   Or  . acetaminophen (TYLENOL) solution 650 mg  650 mg Per Tube Q4H PRN Jani Gravel, MD       Or  . acetaminophen (TYLENOL) suppository 650 mg  650 mg Rectal Q4H PRN Jani Gravel, MD      . diltiazem (CARDIZEM) tablet 30 mg  30 mg Oral BID Jani Gravel, MD   30 mg at 12/18/17 1101  . donepezil (ARICEPT) tablet 10 mg  10 mg Oral Loma Sousa, MD   10 mg at 12/17/17 2129  . flecainide (TAMBOCOR) tablet 75 mg  75 mg Oral BID Jani Gravel, MD   75 mg at 12/18/17 1101  . folic acid injection 1 mg  1 mg Intravenous Daily Jani Gravel, MD   1 mg at 12/17/17 1403  . LORazepam (ATIVAN) injection 2-3 mg  2-3 mg Intravenous Q1H PRN Jani Gravel, MD   2 mg at 12/17/17 1026  . memantine (NAMENDA) tablet 10 mg  10 mg Oral BID Jani Gravel, MD   10 mg at 12/18/17 1101  . rivaroxaban (XARELTO) tablet 20 mg  20 mg Oral Q supper Skipper Cliche A, MD   20 mg at 12/17/17 2237  . rosuvastatin (CRESTOR) tablet 20 mg  20 mg Oral QHS Shahmehdi, Seyed A, MD   20 mg at 12/17/17 2129  . tamsulosin (FLOMAX) capsule 0.4 mg  0.4 mg Oral QHS Jani Gravel, MD   0.4 mg at 12/17/17 2129  . thiamine (B-1) injection 100 mg  100 mg Intravenous Daily Jani Gravel, MD   100 mg at 12/18/17 1101     Discharge Medications: Please see discharge summary for a list of discharge medications.  Relevant Imaging Results:  Relevant Lab Results:   Additional Information SSN: [id number] [place]

## 2017-12-18 NOTE — Progress Notes (Signed)
PROGRESS NOTE    Patient: Christian [name]     PCP: Carlena Hurl, PA-C                    DOB: 1941-08-04            DOA: 12/16/2017 QMG:[id number]             DOS: 12/18/2017, 2:10 PM   Date of Service: the patient was seen and examined on 12/18/2017 Subjective:   Seen with RN Crystal at bedside, questions answered, no headaches, no visual disturbance, no chest pains no palpitations  ----------------------------------------------------------------------------------------------------------------------  Brief Narrative:  Bandon Sherwin  is a 77 y.o. male, h/o PE/ DVT, (prothrombin gene mutation), hypertension, hyperlipidemia, ? Diastolic CHF, palitations on flecainide, cognitive decline who apparently was last seen well by wife around 10 am when she left for work.  He wasn't answering his phone so she had a friend check on him and he was unresponsive and EMS summoned.  Pt time EMS arrived he was responding but with slurred speech,   There was some question of right facial droop.  Pt was drinking 1.5 pints of liquor today because he felt lonely.    In ED,  Wife states that he does not drink on a regular basis and will not have etoh withdrawal.  BAL 211.    Assessment & Plan:   1)Altered mental status/unresponsive -likely alcohol intoxication -MRI and MRV without acute stroke findings or large vessel occlusion, echocardiogram pending, continue Xarelto, carotid artery Dopplers without hemodynamically significant stenosis.  PT evaluation noted, urology consult appreciated, OT eval pending  2)Alcohol intoxication -patient admits to intermittent heavy alcohol use since age 77 when he was diagnosed with dementia he became kind of depression/lonely, psychology/psychiatric consult appreciated.  Continue lorazepam per CIWA protocol, no evidence of DTs at this time.  Continue folic acid and thiamine  3)Depression -denies suicidal/homicidal ideation or plan, refusing antidepressants, he apparently  has been self-medicating with alcohol.  Psychiatric/psychological consult appreciated  4)History of PE/ DVT -patient with history of hypercoagulable state/prothrombin gene mutation) , continue Xarelto which patient has been taking since 2016  5)Diabetes mellitus - -A1c is 8.0, Levemir 10 units daily, along with sliding scale  6)PAFib-change Cardizem to 30 mg 3 times daily for rate control, continue flecainide and give Xarelto for anticoagulation  7)Dementia -continue Aricept and Namenda, patient with some cognitive deficits   8)BPH - Continue Flomax  9)Dyslipidemia-continue Crestor  10)Disposition-patient with generalized weakness/debility, requiring assist of 2, at risk for falls , physical therapist recommend skilled nursing facility, attempted to reach patient's wife at cell phone and home phone on 12/18/2017, I left messages on both phones.  Hold off on discharge at this time as echocardiogram is pending, occupational therapy evaluation results are pending, monitor responds to medication adjustments including changes to Cardizem frequency and Levemir reinitiation  DVT prophylaxis: - Xarelto            Code Status:         Full code Family Communication:  The above findings and plan of care has been discussed with patient  in detail, they expressed understanding and agreement of above.   Disposition Plan:  1-2 days,  ??? SNF              Consultants:   Neurology  Procedures:   none  Antimicrobials:  Anti-infectives (From admission, onward)   None       Objective: Vitals:   12/18/17 1100 12/18/17 1200  12/18/17 1300 12/18/17 1400  BP: (!) 142/59 136/74 140/68 (!) 111/56  Pulse: 66 70 76 61  Resp: 16 14 16 16   Temp:    98.6 F (37 C)  TempSrc:    Oral  SpO2: 95% 95% 95% 94%  Weight:    98 kg (216 lb 0.8 oz)  Height:    5\' 11"  (1.803 m)    Intake/Output Summary (Last 24 hours) at 12/18/2017 1410 Last data filed at 12/18/2017 0400 Gross per 24 hour  Intake 240 ml   Output 800 ml  Net -560 ml   Filed Weights   12/17/17 0005 12/18/17 0500 12/18/17 1400  Weight: 97.2 kg (214 lb 4.6 oz) 97.8 kg (215 lb 9.8 oz) 98 kg (216 lb 0.8 oz)    Examination:  General exam: Appears calm and comfortable  Respiratory system: Clear to auscultation. Respiratory effort normal. Cardiovascular system: History of A. fib currently regular  gastrointestinal system: Abdomen is nondistended, soft and nontender. No organomegaly or masses felt. Normal bowel sounds heard. Central nervous system: Generalized weakness/debility, no new focal deficits, patient does have some cognitive deficits  extremities: Symmetric 5 x 5 power. Skin: No rashes, lesions or ulcers Psychiatry: Affect is appropriate   CBC: Recent Labs  Lab 12/16/17 1904 12/16/17 1918 12/18/17 0542  WBC 10.9*  --  8.2  NEUTROABS 9.5*  --   --   HGB 15.6 16.7 13.4  HCT 48.2 49.0 41.6  MCV 92.3  --  90.8  PLT 252  --  297   Basic Metabolic Panel: Recent Labs  Lab 12/16/17 1904 12/16/17 1918  NA 143 142  K 4.0 3.9  CL 105 104  CO2 18*  --   GLUCOSE 175* 176*  BUN 13 12  CREATININE 0.68 1.00  CALCIUM 9.3  --    GFR: Estimated Creatinine Clearance: 77.4 mL/min (by C-G formula based on SCr of 1 mg/dL). Liver Function Tests: Recent Labs  Lab 12/16/17 1904  AST 21  ALT 18  ALKPHOS 81  BILITOT 0.3  PROT 7.5  ALBUMIN 4.1   No results for input(s): LIPASE, AMYLASE in the last 168 hours. Recent Labs  Lab 12/16/17 1916  AMMONIA 13   Coagulation Profile: Recent Labs  Lab 12/16/17 1904  INR 1.03   Cardiac Enzymes: No results for input(s): CKTOTAL, CKMB, CKMBINDEX, TROPONINI in the last 168 hours. BNP (last 3 results) No results for input(s): PROBNP in the last 8760 hours. HbA1C: Recent Labs    12/17/17 0405  HGBA1C 8.0*   CBG: Recent Labs  Lab 12/16/17 1902 12/17/17 0045  GLUCAP 169* 111*   Lipid Profile: Recent Labs    12/17/17 0405  CHOL 132  HDL 38*  LDLCALC 80    TRIG 69  CHOLHDL 3.5   Thyroid Function Tests: No results for input(s): TSH, T4TOTAL, FREET4, T3FREE, THYROIDAB in the last 72 hours. Anemia Panel: No results for input(s): VITAMINB12, FOLATE, FERRITIN, TIBC, IRON, RETICCTPCT in the last 72 hours. Sepsis Labs: No results for input(s): PROCALCITON, LATICACIDVEN in the last 168 hours.  Recent Results (from the past 240 hour(s))  MRSA PCR Screening     Status: Abnormal   Collection Time: 12/16/17 11:52 PM  Result Value Ref Range Status   MRSA by PCR INVALID RESULTS, SPECIMEN SENT FOR CULTURE (A) NEGATIVE Final    Comment: RESULT CALLED TO, READ BACK BY AND VERIFIED WITH: WAGONER,R @ 0515 ON 1.23.19 BY BOWMAN,L        The GeneXpert MRSA Assay (  FDA approved for NASAL specimens only), is one component of a comprehensive MRSA colonization surveillance program. It is not intended to diagnose MRSA infection nor to guide or monitor treatment for MRSA infections.        Radiology Studies:  Ct Head Wo Contrast  Result Date: 12/16/2017 CLINICAL DATA:  Slurred speech and right facial droop.  Confusion. EXAM: CT HEAD W. IMPRESSION: 1. Findings felt to represent acute infarct in the right inferior, posterior frontal lobe and involving a portion of the medial anterior right temporal lobe near the sylvian fissure on the right. 2.  No mass or hemorrhage. 3.  Mucosal thickening in several ethmoid air cells. 4. Mild right-sided mastoid disease. Probable cerumen in each external auditory canal. These results were called by telephone at the time of interpretation on 12/16/2017 at 8:06 pm to Dr. Wilson Singer, ED physician , who verbally acknowledged these results. Electronically Signed   By: Lowella Grip III M.D.   On: 12/16/2017 20:06   Mr Brain Wo Contrast  Result Date: 12/17/2017 CLINICAL DATA:  Patient found unresponsive. Slurred speech, right-sided facial droop, confusion. EXAM: MRI HEAD WITHOUT CONTRAST MRA HEAD WITHOUT CONTRAST TECHNIQUE:  Multiplanar, multiecho pulse sequences of the brain and surrounding structures were obtained without intravenous contrast. Angiographic images of the head were obtained using MRA technique without contrast. COMPARISON:  CT head without contrast 12/16/2017.  IMPRESSION: 1. No acute or subacute infarct. 2. Normal MRI appearance brain for age. 3. Mild distal small vessel disease evident on the MRA. No significant proximal stenosis, aneurysm, or branch vessel occlusion. Electronically Signed   By: San Morelle M.D.   On: 12/17/2017 09:40   US Carotid Bilateral (at [facility] And Ap Only)  Result Date: 12/17/2017 CLINICAL DATA:  Stroke symptoms, hypertension, hyperlipidemia EXAM: BILATERAL CAROTID DUPLEX ULTRASOUND TECHNIQUE: Pearline Cables scale imaging, color Doppler and duplex ultrasound were performed of bilateral carotid and vertebral arteries in the neck. COMPARISON:  None. FI  IMPRESSION: Minor carotid atherosclerosis. No hemodynamically significant ICA stenosis. Degree of narrowing less than 50% bilaterally by ultrasound criteria. Patent antegrade vertebral flow bilaterally Electronically Signed   By: Jerilynn Mages.  Shick M.D.   On: 12/17/2017 09:59   Dg Chest Port 1 View  Result Date: 12/16/2017 CLINICAL DATA:  Vomiting EXAM: PORTABLE CHEST 1 VIEW COMPARISON:  10/27/2016 FINDINGS: Low volume chest with borderline cardiomegaly. There is no edema, consolidation, effusion, or pneumothorax. Artifact from EKG leads. Moderate distension of the stomach by gas. IMPRESSION: Limited low volume chest without acute finding. Electronically Signed   By: Monte Fantasia M.D.   On: 12/16/2017 19:39   Mr Jodene Nam Head/brain PJ Cm  Result Date: 12/17/2017 CLINICAL DATA:  Patient found unresponsive. Slurred speech, right-sided facial droop, confusion. EXAM: MRI HEAD WITHOUT CONTRAST MRA HEAD WITHOUT CONTRAST TECHNI IMPRESSION: 1. No acute or subacute infarct. 2. Normal MRI appearance brain for age. 3. Mild distal small vessel disease  evident on the MRA. No significant proximal stenosis, aneurysm, or branch vessel occlusion. Electronically Signed   By: San Morelle M.D.   On: 12/17/2017 09:40   Scheduled Meds: . diltiazem  30 mg Oral BID  . donepezil  10 mg Oral QHS  . flecainide  75 mg Oral BID  . folic acid  1 mg Intravenous Daily  . memantine  10 mg Oral BID  . rivaroxaban  20 mg Oral Q supper  . rosuvastatin  20 mg Oral QHS  . tamsulosin  0.4 mg Oral QHS  . thiamine  100 mg Intravenous  Daily   Continuous Infusions:   LOS: 2 days    Roxan Hockey, MD [facility] Pager [phone]  If 7PM-7AM, please contact night-coverage [email or web address] Password [email or web address] 12/18/2017, 2:10 PM

## 2017-12-18 NOTE — Progress Notes (Signed)
SLP Cancellation Note  Patient Details Name: Christian [name] MRN: [id number] DOB: [birth year]   Cancelled treatment:       Reason Eval/Treat Not Completed: Patient at procedure or test/unavailable; Pt passed RN swallow screen and has reportedly tolerated all meals without incident. Unable to complete SLE at this time due to Pt in procedure in room. SLP will check back as schedule permits. Pt's wife would only like information to be shared with herself Shanda Howells) and their daughter Theda Sers). Will pass this information to RN.  Thank you,  Genene Churn, [place]    [place] 12/18/2017, 2:27 PM

## 2017-12-18 NOTE — Progress Notes (Signed)
Physical Therapy Treatment Patient Details Name: Christian [name] MRN: [id number] DOB: [birth year] Today's Date: 12/18/2017    History of Present Illness Christian [name]  is a 77 y.o. male, h/o PE/ DVT, (prothrombin gene mutation), hypertension, hyperlipidemia, ? Diastolic CHF, palitations on flecainide, cognitive decline who apparently was last seen well by wife around 10 am when she left for work.  He wasn't answering his phone so she had a friend check on him and he was unresponsive and EMS summoned.  Pt time EMS arrived he was responding but with slurred speech,   There was some question of right facial droop.  Pt was drinking 1.5 pints of liquor today because he felt lonely.      PT Comments    Patient demonstrates much improvement in functional mobility/gait and able to ambulate without use of assistive device, slightly labored movement without loss of balance, but slow cadence due to patient fearful of falling.  Patient will benefit from continued physical therapy in hospital and recommended venue below to increase strength, balance, endurance for safe ADLs and gait.   Follow Up Recommendations  SNF;Supervision/Assistance - 24 hour     Equipment Recommendations  Rolling walker with 5" wheels    Recommendations for Other Services       Precautions / Restrictions Precautions Precautions: Fall Restrictions Weight Bearing Restrictions: No    Mobility  Bed Mobility Overal bed mobility: Needs Assistance Bed Mobility: Supine to Sit     Supine to sit: Supervision        Transfers Overall transfer level: Needs assistance Equipment used: None Transfers: Sit to/from Stand Sit to Stand: Min guard            Ambulation/Gait Ambulation/Gait assistance: Min guard Ambulation Distance (Feet): 50 Feet Assistive device: None Gait Pattern/deviations: Decreased step length - right;Decreased step length - left;Decreased stride length   Gait velocity interpretation: Below normal  speed for age/gender General Gait Details: deomonstrates slightly labored cadence without loss of balance, patient states he feels like he is leaning forward and may trip if walks to fast   Stairs            Wheelchair Mobility    Modified Rankin (Stroke Patients Only)       Balance Overall balance assessment: Needs assistance Sitting-balance support: Feet supported;No upper extremity supported Sitting balance-Leahy Scale: Good     Standing balance support: No upper extremity supported;During functional activity Standing balance-Leahy Scale: Fair                              Cognition Arousal/Alertness: Awake/alert Behavior During Therapy: WFL for tasks assessed/performed Overall Cognitive Status: Within Functional Limits for tasks assessed                                        Exercises General Exercises - Lower Extremity Long Arc Quad: Seated;AROM;Strengthening;Both;10 reps Hip Flexion/Marching: AROM;Seated;Strengthening;Both;10 reps Toe Raises: Seated;AROM;Strengthening;Both;10 reps Heel Raises: Seated;AROM;Strengthening;Both;10 reps    General Comments        Pertinent Vitals/Pain Pain Assessment: No/denies pain    Home Living                      Prior Function            PT Goals (current goals can now be found in the care plan section)  Acute Rehab PT Goals Patient Stated Goal: return home with his spouse to assist PT Goal Formulation: With patient Time For Goal Achievement: 12/24/17 Potential to Achieve Goals: Good Progress towards PT goals: Progressing toward goals    Frequency    Min 3X/week      PT Plan Current plan remains appropriate    Co-evaluation              AM-PAC PT "6 Clicks" Daily Activity  Outcome Measure  Difficulty turning over in bed (including adjusting bedclothes, sheets and blankets)?: None Difficulty moving from lying on back to sitting on the side of the bed? :  None Difficulty sitting down on and standing up from a chair with arms (e.g., wheelchair, bedside commode, etc,.)?: A Little Help needed moving to and from a bed to chair (including a wheelchair)?: A Little Help needed walking in hospital room?: A Little Help needed climbing 3-5 steps with a railing? : A Little 6 Click Score: 20    End of Session Equipment Utilized During Treatment: Gait belt Activity Tolerance: Patient tolerated treatment well Patient left: in bed;with call bell/phone within reach;with family/visitor present Nurse Communication: Mobility status PT Visit Diagnosis: Unsteadiness on feet (R26.81);Other abnormalities of gait and mobility (R26.89);Muscle weakness (generalized) (M62.81)     Time: 8916-9450 PT Time Calculation (min) (ACUTE ONLY): 26 min  Charges:  $Therapeutic Activity: 23-37 mins                    G Codes:       4:01 PM, Jan 14, 2018 Lonell Grandchild, MPT Physical Therapist with [facility] [phone] office [phone] mobile phone

## 2017-12-18 NOTE — Clinical Social Work Note (Signed)
Clinical Social Work Assessment  Patient Details  Name: Christian [name] MRN: [id number] Date of Birth: 04-Dec-1940  Date of referral:  12/18/17               Reason for consult:  Facility Placement                Permission sought to share information with:  Chartered certified accountant granted to share information::  Yes, Verbal Permission Granted  Name::        Agency::  [employer]  Relationship::     Contact Information:     Housing/Transportation Living arrangements for the past 2 months:  [place] of Information:  Patient, Medical Team, Spouse Patient Interpreter Needed:  None Criminal Activity/Legal Involvement Pertinent to Current Situation/Hospitalization:  No - Comment as needed Significant Relationships:  Spouse Lives with:  Spouse Do you feel safe going back to the place where you live?  Yes Need for family participation in patient care:  Yes (Comment) Care giving concerns: Pt's wife states that she cannot manage his care at home until he is more ambulatory.   Social Worker assessment / plan: Pt is a 77 year old male admitted with possible CVA. Pt was intoxicated at the time of admission and he was making statements about not wanting to live. Pt had a TTS psych evaluation and was cleared. Pt has made no further statements to this effect since sobering up. Pt denies suicidal thoughts or plans to LCSW during assessment. Pt lives with his wife in their home in [place]. PT is recommending SNF rehab for pt. Pt states he just wants to go home but after speaking with his wife, he is agreeable to referral to [facility]. Referral started. MD indicates pt will likely be stable for dc tomorrow.  Employment status:  Retired Forensic scientist:  Medicare PT Recommendations:  [place] / Referral to community resources:     Patient/Family's Response to care: Pt's wife is accepting of SW care. Pt is not very happy with  SW.  Patient/Family's Understanding of and Emotional Response to Diagnosis, Current Treatment, and Prognosis: Pt does not appear to fully understand the extent of his limitations. He does not want to go to SNF. Emotional support provided.   Emotional Assessment Appearance:  Appears stated age Attitude/Demeanor/Rapport:  Angry Affect (typically observed):  Frustrated, Irritable Orientation:  Oriented to Self, Oriented to Place, Oriented to Situation, Oriented to  Time Alcohol / Substance use:  Alcohol Use Psych involvement (Current and /or in the community):  Yes (Comment)(Pt had a TTS consult)  Discharge Needs  Concerns to be addressed:  Discharge Planning Concerns Readmission within the last 30 days:    Current discharge risk:  Dependent with Mobility Barriers to Discharge:  No Barriers Identified   Shade Flood, LCSW 12/18/2017, 12:38 PM

## 2017-12-18 NOTE — Telephone Encounter (Signed)
Get in for hospital f/u.   I would like to see him in the room by himself initially.

## 2017-12-19 DIAGNOSIS — R5381 Other malaise: Secondary | ICD-10-CM

## 2017-12-19 MED ORDER — FOLIC ACID 1 MG PO TABS: 1.0000 mg | ORAL_TABLET | Freq: Every day | ORAL | 1 refills | Status: DC

## 2017-12-19 MED ORDER — RIVAROXABAN 20 MG PO TABS: ORAL_TABLET | ORAL | 1 refills | Status: DC

## 2017-12-19 MED ORDER — ROSUVASTATIN CALCIUM 10 MG PO TABS: 10.0000 mg | ORAL_TABLET | Freq: Every day | ORAL | 1 refills | Status: DC

## 2017-12-19 MED ORDER — VITAMIN B-1 100 MG PO TABS
100.0000 mg | ORAL_TABLET | Freq: Every day | ORAL | Status: DC
  Administered 2017-12-19: 100 mg via ORAL
  Filled 2017-12-19: qty 1

## 2017-12-19 MED ORDER — FOLIC ACID 1 MG PO TABS
1.0000 mg | ORAL_TABLET | Freq: Every day | ORAL | Status: DC
  Administered 2017-12-19: 1 mg via ORAL
  Filled 2017-12-19: qty 1

## 2017-12-19 MED ORDER — TAMSULOSIN HCL 0.4 MG PO CAPS: 0.4000 mg | ORAL_CAPSULE | Freq: Every day | ORAL | 1 refills | Status: DC

## 2017-12-19 MED ORDER — ASPIRIN EC 81 MG PO TBEC: 81.0000 mg | DELAYED_RELEASE_TABLET | Freq: Every day | ORAL | 2 refills | Status: DC

## 2017-12-19 MED ORDER — MEMANTINE HCL 10 MG PO TABS: 10.0000 mg | ORAL_TABLET | Freq: Two times a day (BID) | ORAL | 2 refills | Status: DC

## 2017-12-19 MED ORDER — DILTIAZEM HCL ER COATED BEADS 120 MG PO CP24: 120.0000 mg | ORAL_CAPSULE | Freq: Every day | ORAL | 1 refills | Status: DC

## 2017-12-19 MED ORDER — THIAMINE HCL 100 MG PO TABS: 100.0000 mg | ORAL_TABLET | Freq: Every day | ORAL | 1 refills | Status: DC

## 2017-12-19 MED ORDER — DONEPEZIL HCL 10 MG PO TABS: 10.0000 mg | ORAL_TABLET | Freq: Every day | ORAL | 1 refills | Status: DC

## 2017-12-19 MED ORDER — FLECAINIDE ACETATE 150 MG PO TABS: 75.0000 mg | ORAL_TABLET | Freq: Two times a day (BID) | ORAL | 1 refills | Status: DC

## 2017-12-19 MED ORDER — ALUM & MAG HYDROXIDE-SIMETH 200-200-20 MG/5ML PO SUSP
30.0000 mL | Freq: Four times a day (QID) | ORAL | Status: DC | PRN
  Administered 2017-12-19: 30 mL via ORAL
  Filled 2017-12-19: qty 30

## 2017-12-19 MED ORDER — METFORMIN HCL 850 MG PO TABS: 850.0000 mg | ORAL_TABLET | Freq: Two times a day (BID) | ORAL | 3 refills | Status: DC

## 2017-12-19 NOTE — Care Management Important Message (Signed)
Important Message  Patient Details  Name: Christian [name] MRN: [id number] Date of Birth: 08-Jun-1941   Medicare Important Message Given:  Yes    Shlomie Romig, Chauncey Reading, RN 12/19/2017, 7:33 AM

## 2017-12-19 NOTE — Discharge Instructions (Signed)
Quit Drinking Alcohol

## 2017-12-19 NOTE — Care Management Note (Signed)
Case Management Note  Patient Details  Name: Christian [name] MRN: [id number] Date of Birth: [birth year]     Expected Discharge Date:   12/19/2017               Expected Discharge Plan:  [place]  In-House Referral:  Clinical Social Work  Discharge planning Services  CM Consult  Post Acute Care Choice:  Home Health Choice offered to:  Spouse, Patient  DME Arranged:    DME Agency:     HH Arranged:  RN, PT, Social Work [employer] Agency:  [place]  Status of Service:  Completed, signed off  If discussed at [employer], dates discussed:    Additional Comments: Patient discharging today. Initial plan was for patient to go to SNF.  Patient was reluctant but was agreeable as wife stated he needed to go. Now his wife reports she would like to take patient home with home health. She repotts she plans to take patient to PCP and have his anxiety and depression addressed.  He has had [facility] and would like [facility] this time. Patient has RW, cane pta. Shower has grab bars. Attending notified. [name] of [facility] notified and will obtain orders when available.   Toma Arts, Chauncey Reading, RN 12/19/2017, 10:40 AM

## 2017-12-19 NOTE — Clinical Social Work Note (Signed)
Pt stable for dc today per MD. Pt has a bed offer from [facility] but he and his wife now say that they would like to dc home with [place]. RN CM, Sharley, aware and she has spoken to pt's wife. Plan now is for return to home with [facility]. There are no other SW needs at this time.

## 2017-12-19 NOTE — Discharge Summary (Signed)
Christian [name], is a 77 y.o. male  DOB 08-12-1941  MRN [id number].  Admission date:  12/16/2017  Admitting Physician  Jani Gravel, MD  Discharge Date:  12/19/2017   Primary MD  Tysinger, Camelia Eng, PA-C  Recommendations for primary care physician for things to follow:   Admission Diagnosis  Alcoholic intoxication with complication [facility]) [B35.329] Cerebrovascular accident (CVA), unspecified mechanism ([place]) [I63.9]   Discharge Diagnosis  Alcoholic intoxication with complication ([place]) [J24.268] Cerebrovascular accident (CVA), unspecified mechanism ([place]) [I63.9]    Principal Problem:   Physical deconditioning Active Problems:   Essential hypertension   Dementia   Diabetes type 2, controlled ([place])   Decreased energy   Pulmonary embolism (H[facility])   Generalized weakness   Gait disturbance   Alcohol intoxication ([place])      Past Medical History:  Diagnosis Date  . Agitation   . Arthritis   . Cataract   . Chronic back pain   . Confusion   . Diabetes ([place])    type 2  . DVT (deep venous thrombosis) ([place]) 01/2015  . Hearing impaired   . High cholesterol   . Hypertension   . Insomnia   . Insomnia   . Leg pain, diffuse   . Leg swelling   . Memory loss   . Mild cognitive impairment    sees [facility]  . Numbness    fingers, feet, toes  . OSA on CPAP   . 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 06/14/2015  . Pulmonary embolism ([place]) 01/2015  . Spinal stenosis    lumbar  . Tremor    on propranolol  . Wears glasses     Past Surgical History:  Procedure Laterality Date  . CATARACT EXTRACTION    . COLONOSCOPY  2014  . TONSILLECTOMY         HPI  from the history and physical done on the day of admission:     Christian [name]  is a 77 y.o. male, h/o PE/ DVT, (prothrombin gene mutation), hypertension, hyperlipidemia, ? Diastolic CHF,  palitations on flecainide, cognitive decline who apparently was last seen well by wife around 10 am when she left for work.  He wasn't answering his phone so she had a friend check on him and he was unresponsive and EMS summoned.  Pt time EMS arrived he was responding but with slurred speech,   There was some question of right facial droop.  Pt was drinking 1.5 pints of liquor today because he felt lonely.    In ED,  Wife states that he does not drink on a regular basis and will not have etoh withdrawal.  BAL 211.   CT brain  IMPRESSION: 1. Findings felt to represent acute infarct in the right inferior, posterior frontal lobe and involving a portion of the medial anterior right temporal lobe near the sylvian fissure on the right.  2. No mass or hemorrhage.  3. Mucosal thickening in several ethmoid air cells.  4. Mild right-sided  mastoid disease. Probable cerumen in each external auditory canal.  Neurology telemedicine called and did not think patient was having CVA, more likely related to etoh intoxication.  I d/w wife will hold Xarelto tonight because of risk of bleeding in case of CVA and restart if MRI brain negative. Pt and wife understand that risk of hold xarelto is increase risk of blood clot and are in agreement with plan.  Pt will be admitted for r/o CVA, and etoh intoxication.        Hospital Course:     1)Altered mental status/unresponsive -likely alcohol intoxication -MRI and MRA without acute stroke findings or large vessel occlusion, echocardiogram with EF 60 to 65 %, no regional wall motion abnormalities. Continue Xarelto, Carotid Artery Dopplers without hemodynamically significant stenosis.  PT evaluation noted, Neurology consult appreciated, pt and wife declines SNF placement, they are requesting discharge home with home health services.   2)Alcohol intoxication -patient admits to intermittent heavy alcohol use since age 19 when he was diagnosed with dementia he  became kind of depression/lonely, psychology/psychiatric consult appreciated.  no evidence of DTs at this time.  Continue folic acid and thiamine  3)Depression -denies suicidal/homicidal ideation or plan, refusing antidepressants, he apparently has been self-medicating with alcohol.  Psychiatric/psychological consult appreciated  4)History of PE/ DVT -patient with history of hypercoagulable state/prothrombin gene mutation) , continue Xarelto which patient has been taking since 2016  5)Diabetes mellitus - -A1c is 8.0, c/n  Levemir   6)PAFib-change Cardizem CD 120 mg daily for rate control, continue flecainide and give Xarelto for anticoagulation  7)Dementia -continue Aricept and Namenda, patient with some cognitive deficits   8)BPH - Continue Flomax  9)Dyslipidemia-continue Crestor  10)Disposition-patient with generalized weakness/debility,  pt and wife declines SNF placement, they are requesting discharge home with home health services.     Discharge Condition: stable   Diet and Activity recommendation:  As advised  Discharge Instructions     Discharge Instructions    Call MD for:  difficulty breathing, headache or visual disturbances   Complete by:  As directed    Call MD for:  persistant dizziness or light-headedness   Complete by:  As directed    Call MD for:  persistant nausea and vomiting   Complete by:  As directed    Call MD for:  temperature >100.4   Complete by:  As directed    Diet - low sodium heart healthy   Complete by:  As directed    Discharge instructions   Complete by:  As directed    1)You will get Physical Therapy at Home for your Generalized weakness/Deconditioning/Falls 2)Avoid Alcohol   Increase activity slowly   Complete by:  As directed    Walk with assistance   Complete by:  As directed         Discharge Medications     Allergies as of 12/19/2017      Reactions   Bee Venom Swelling   Ultram [tramadol Hcl]    Makes him wired,  gives insomnia   Oxycodone Other (See Comments)   Mental status changes   Oxycontin [oxycodone Hcl] Other (See Comments)   Mental status changes per pt      Medication List    STOP taking these medications   diltiazem 30 MG tablet Commonly known as:  CARDIZEM     TAKE these medications   aspirin EC 81 MG tablet Take 1 tablet (81 mg total) by mouth daily.   budesonide-formoterol 160-4.5 MCG/ACT inhaler Commonly  known as:  SYMBICORT Inhale 2 puffs into the lungs 2 (two) times daily. What changed:    when to take this  reasons to take this   diltiazem 120 MG 24 hr capsule Commonly known as:  CARDIZEM CD Take 1 capsule (120 mg total) by mouth daily.   donepezil 10 MG tablet Commonly known as:  ARICEPT Take 1 tablet (10 mg total) by mouth at bedtime.   empagliflozin 10 MG Tabs tablet Commonly known as:  JARDIANCE 1/2 tablet daily What changed:    how much to take  how to take this  when to take this  additional instructions   JARDIANCE 10 MG Tabs tablet Generic drug:  empagliflozin TAKE 1 TABLET BY MOUTH DAILY What changed:  Another medication with the same name was changed. Make sure you understand how and when to take each.   flecainide 150 MG tablet Commonly known as:  TAMBOCOR Take 0.5 tablets (75 mg total) by mouth 2 (two) times daily. What changed:  Another medication with the same name was removed. Continue taking this medication, and follow the directions you see here.   folic acid 1 MG tablet Commonly known as:  FOLVITE Take 1 tablet (1 mg total) by mouth daily. Start taking on:  12/20/2017   insulin degludec 100 UNIT/ML Sopn FlexTouch Pen Commonly known as:  TRESIBA FLEXTOUCH Inject 0.15 mLs (15 Units total) daily into the skin.   insulin degludec 100 UNIT/ML Sopn FlexTouch Pen Commonly known as:  TRESIBA Inject 0.15 mLs (15 Units total) daily at 10 pm into the skin.   Insulin Pen Needle 32G X 4 MM Misc Commonly known as:  BD PEN NEEDLE NANO  U/F 1 each by Does not apply route daily. DX E11.9   INVOKAMET [phone] MG Tabs Generic drug:  Canagliflozin-Metformin HCl TAKE 1 TABLET BY MOUTH TWICE DAILY   memantine 10 MG tablet Commonly known as:  NAMENDA Take 1 tablet (10 mg total) by mouth 2 (two) times daily.   metFORMIN 850 MG tablet Commonly known as:  GLUCOPHAGE Take 1 tablet (850 mg total) by mouth 2 (two) times daily with a meal. What changed:  Another medication with the same name was removed. Continue taking this medication, and follow the directions you see here.   OSTEO BI-FLEX ADV JOINT SHIELD PO Take 1 tablet by mouth daily.   POLY-IRON 150 150 MG capsule Generic drug:  iron polysaccharides TAKE 1 CAPSULE(150 MG) BY MOUTH DAILY   rivaroxaban 20 MG Tabs tablet Commonly known as:  XARELTO TAKE 1 TABLET(20 MG) BY MOUTH DAILY WITH SUPPER   rosuvastatin 10 MG tablet Commonly known as:  CRESTOR Take 1 tablet (10 mg total) by mouth at bedtime.   tamsulosin 0.4 MG Caps capsule Commonly known as:  FLOMAX Take 1 capsule (0.4 mg total) by mouth daily. What changed:  Another medication with the same name was removed. Continue taking this medication, and follow the directions you see here.   thiamine 100 MG tablet Take 1 tablet (100 mg total) by mouth daily. Start taking on:  12/20/2017   TRUE METRIX BLOOD GLUCOSE TEST test strip Generic drug:  glucose blood TEST TWICE DAILY AS DIRECTED       Major procedures and Radiology Reports - PLEASE review detailed and final reports for all details, in brief -   Ct Head Wo Contrast  Result Date: 12/16/2017 CLINICAL DATA:  Slurred speech and right facial droop.  Confusion. EXAM: CT HEAD WITHOUT CONTRAST TECHNIQUE: Contiguous axial images  were obtained from the base of the skull through the vertex without intravenous contrast. COMPARISON:  Head CT October 27, 2016 and brain MRI June 19, 2017 FINDINGS: Brain: There is mild diffuse atrophy. There is no well-defined mass,  hemorrhage, extra-axial fluid collection, or midline shift. There is ill-defined decreased attenuation in the inferior right frontal lobe medial and anterior to the right sylvian fissure. This decreased attenuation involves a portion the right insular cortex along its inferior aspect as well as a portion of the right claustrum inferiorly. This areas felt to be consistent with an acute infarct. No other acute infarct evident. Vascular: There is no appreciable hyperdense vessel. There is no appreciable vascular calcification. Skull: Bony calvarium appears intact. Sinuses/Orbits: There is mucosal thickening in several ethmoid air cells bilaterally. Other visualized paranasal sinuses are clear. Orbits appear symmetric bilaterally except for apparent cataract extraction on the right. Other: There is minimal mastoid disease on the right posteriorly. Mastoids elsewhere clear. There is debris in each external auditory canal. IMPRESSION: 1. Findings felt to represent acute infarct in the right inferior, posterior frontal lobe and involving a portion of the medial anterior right temporal lobe near the sylvian fissure on the right. 2.  No mass or hemorrhage. 3.  Mucosal thickening in several ethmoid air cells. 4. Mild right-sided mastoid disease. Probable cerumen in each external auditory canal. These results were called by telephone at the time of interpretation on 12/16/2017 at 8:06 pm to Dr. Wilson Singer, ED physician , who verbally acknowledged these results. Electronically Signed   By: Lowella Grip III M.D.   On: 12/16/2017 20:06   Mr Brain Wo Contrast  Result Date: 12/17/2017 CLINICAL DATA:  Patient found unresponsive. Slurred speech, right-sided facial droop, confusion. EXAM: MRI HEAD WITHOUT CONTRAST MRA HEAD WITHOUT CONTRAST TECHNIQUE: Multiplanar, multiecho pulse sequences of the brain and surrounding structures were obtained without intravenous contrast. Angiographic images of the head were obtained using MRA  technique without contrast. COMPARISON:  CT head without contrast 12/16/2017. FINDINGS: MRI HEAD FINDINGS Brain: The diffusion-weighted images demonstrate no acute or subacute infarct. The area of hypoattenuation on the previous CT scan is felt to be artifactual. Mild atrophy is within normal limits for age. No significant white matter disease is present. A single remote lacunar infarct is present in the left cerebellum. The brainstem and cerebellum are otherwise normal. The internal auditory canals are within normal limits. Vascular: Flow is present in the major intracranial arteries. Skull and upper cervical spine: The skull base is within normal limits. The craniocervical junction is normal. Sinuses/Orbits: The paranasal sinuses are clear. There fluid in the right mastoid air cells. No obstructing nasopharyngeal lesion is present. A right lens replacement is present. Globes and orbits are otherwise within normal limits. MRA HEAD FINDINGS The internal carotid arteries are within normal limits from the high cervical segments through the ICA termini. The A1 and M1 segments are normal. The anterior communicating artery is patent. There is some attenuation of distal MCA branch vessels bilaterally without a significant proximal stenosis or occlusion. There is no aneurysm. The right vertebral artery is dominant. PICA origins are below the field of view. The basilar artery is normal. Both posterior cerebral arteries originate from the basilar tip. There is attenuation of distal small vessels bilaterally. IMPRESSION: 1. No acute or subacute infarct. 2. Normal MRI appearance brain for age. 3. Mild distal small vessel disease evident on the MRA. No significant proximal stenosis, aneurysm, or branch vessel occlusion. Electronically Signed   By: Harrell Gave  Mattern M.D.   On: 12/17/2017 09:40   US Carotid Bilateral (at [facility] And [facility] Only)  Result Date: 12/17/2017 CLINICAL DATA:  Stroke symptoms, hypertension,  hyperlipidemia EXAM: BILATERAL CAROTID DUPLEX ULTRASOUND TECHNIQUE: Pearline Cables scale imaging, color Doppler and duplex ultrasound were performed of bilateral carotid and vertebral arteries in the neck. COMPARISON:  None. FINDINGS: Criteria: Quantification of carotid stenosis is based on velocity parameters that correlate the residual internal carotid diameter with NASCET-based stenosis levels, using the diameter of the distal internal carotid lumen as the denominator for stenosis measurement. The following velocity measurements were obtained: RIGHT ICA:  83/16 cm/sec CCA:  027/25 cm/sec SYSTOLIC ICA/CCA RATIO:  0.6 DIASTOLIC ICA/CCA RATIO:  1.3 ECA:  176 cm/sec LEFT ICA:  96/10 cm/sec CCA:  366/44 cm/sec SYSTOLIC ICA/CCA RATIO:  0.6 DIASTOLIC ICA/CCA RATIO:  0.6 ECA:  215 cm/sec RIGHT CAROTID ARTERY: Minor echogenic shadowing plaque formation. No hemodynamically significant right ICA stenosis, velocity elevation, or turbulent flow. Degree of narrowing less than 50%. RIGHT VERTEBRAL ARTERY:  Antegrade LEFT CAROTID ARTERY: Similar scattered minor echogenic plaque formation. No hemodynamically significant left ICA stenosis, velocity elevation, or turbulent flow. LEFT VERTEBRAL ARTERY:  Antegrade IMPRESSION: Minor carotid atherosclerosis. No hemodynamically significant ICA stenosis. Degree of narrowing less than 50% bilaterally by ultrasound criteria. Patent antegrade vertebral flow bilaterally Electronically Signed   By: Jerilynn Mages.  Shick M.D.   On: 12/17/2017 09:59   Dg Chest Port 1 View  Result Date: 12/16/2017 CLINICAL DATA:  Vomiting EXAM: PORTABLE CHEST 1 VIEW COMPARISON:  10/27/2016 FINDINGS: Low volume chest with borderline cardiomegaly. There is no edema, consolidation, effusion, or pneumothorax. Artifact from EKG leads. Moderate distension of the stomach by gas. IMPRESSION: Limited low volume chest without acute finding. Electronically Signed   By: Monte Fantasia M.D.   On: 12/16/2017 19:39   Mr Jodene Nam Head/brain IH  Cm  Result Date: 12/17/2017 CLINICAL DATA:  Patient found unresponsive. Slurred speech, right-sided facial droop, confusion. EXAM: MRI HEAD WITHOUT CONTRAST MRA HEAD WITHOUT CONTRAST TECHNIQUE: Multiplanar, multiecho pulse sequences of the brain and surrounding structures were obtained without intravenous contrast. Angiographic images of the head were obtained using MRA technique without contrast. COMPARISON:  CT head without contrast 12/16/2017. FINDINGS: MRI HEAD FINDINGS Brain: The diffusion-weighted images demonstrate no acute or subacute infarct. The area of hypoattenuation on the previous CT scan is felt to be artifactual. Mild atrophy is within normal limits for age. No significant white matter disease is present. A single remote lacunar infarct is present in the left cerebellum. The brainstem and cerebellum are otherwise normal. The internal auditory canals are within normal limits. Vascular: Flow is present in the major intracranial arteries. Skull and upper cervical spine: The skull base is within normal limits. The craniocervical junction is normal. Sinuses/Orbits: The paranasal sinuses are clear. There fluid in the right mastoid air cells. No obstructing nasopharyngeal lesion is present. A right lens replacement is present. Globes and orbits are otherwise within normal limits. MRA HEAD FINDINGS The internal carotid arteries are within normal limits from the high cervical segments through the ICA termini. The A1 and M1 segments are normal. The anterior communicating artery is patent. There is some attenuation of distal MCA branch vessels bilaterally without a significant proximal stenosis or occlusion. There is no aneurysm. The right vertebral artery is dominant. PICA origins are below the field of view. The basilar artery is normal. Both posterior cerebral arteries originate from the basilar tip. There is attenuation of distal small vessels bilaterally. IMPRESSION: 1. No  acute or subacute infarct. 2.  Normal MRI appearance brain for age. 3. Mild distal small vessel disease evident on the MRA. No significant proximal stenosis, aneurysm, or branch vessel occlusion. Electronically Signed   By: San Morelle M.D.   On: 12/17/2017 09:40    Micro Results   Recent Results (from the past 240 hour(s))  MRSA culture     Status: None   Collection Time: 12/16/17 11:50 PM  Result Value Ref Range Status   Specimen Description NASAL SWAB  Final   Special Requests NONE  Final   Culture   Final    NO MRSA DETECTED Performed at [facility], [address], [place], [place] [address]    Report Status 12/18/2017 FINAL  Final  MRSA PCR Screening     Status: Abnormal   Collection Time: 12/16/17 11:52 PM  Result Value Ref Range Status   MRSA by PCR INVALID RESULTS, SPECIMEN SENT FOR CULTURE (A) NEGATIVE Final    Comment: RESULT CALLED TO, READ BACK BY AND VERIFIED WITH: WAGONER,R @ 0515 ON 1.23.19 BY BOWMAN,L        The GeneXpert MRSA Assay (FDA approved for NASAL specimens only), is one component of a comprehensive MRSA colonization surveillance program. It is not intended to diagnose MRSA infection nor to guide or monitor treatment for MRSA infections.        Today   Subjective    Christian [name] today has no new complaints, requesting discharge home          Patient has been seen and examined prior to discharge   Objective   Blood pressure (!) 125/56, pulse 68, temperature 97.6 F (36.4 C), temperature source Oral, resp. rate 18, height 5\' 11"  (1.803 m), weight 98 kg (216 lb 0.8 oz), SpO2 97 %.   Intake/Output Summary (Last 24 hours) at 12/19/2017 1636 Last data filed at 12/19/2017 1515 Gross per 24 hour  Intake 1510 ml  Output 2250 ml  Net -740 ml    Exam General exam: Appears calm and comfortable  Respiratory system: Clear to auscultation. Respiratory effort normal. Cardiovascular system: History of A. fib currently regular  gastrointestinal system: Abdomen  is nondistended, soft and nontender. No organomegaly or masses felt. Normal bowel sounds heard. Central nervous system: Generalized weakness/debility, no new focal deficits, patient does have some cognitive deficits  extremities: Symmetric 5 x 5 power. Skin: No rashes, lesions or ulcers Psychiatry: Affect is appropriate     Data Review   CBC w Diff:  Lab Results  Component Value Date   WBC 8.2 12/18/2017   HGB 13.4 12/18/2017   HCT 41.6 12/18/2017   PLT 232 12/18/2017   LYMPHOPCT 8 12/16/2017   MONOPCT 3 12/16/2017   EOSPCT 1 12/16/2017   BASOPCT 1 12/16/2017    CMP:  Lab Results  Component Value Date   NA 142 12/16/2017   K 3.9 12/16/2017   CL 104 12/16/2017   CO2 18 (L) 12/16/2017   BUN 12 12/16/2017   CREATININE 1.00 12/16/2017   CREATININE 0.75 08/21/2017   PROT 7.5 12/16/2017   ALBUMIN 4.1 12/16/2017   BILITOT 0.3 12/16/2017   ALKPHOS 81 12/16/2017   AST 21 12/16/2017   ALT 18 12/16/2017  .   Total Discharge time is about 33 minutes  Roxan Hockey M.D on 12/19/2017 at 4:36 PM  [facility]   Office  [phone]  Voice Recognition Viviann Spare dictation system was used to create this note, attempts have been made to  correct errors. Please contact the author with questions and/or clarifications.

## 2017-12-19 NOTE — Evaluation (Signed)
Occupational Therapy Evaluation Patient Details Name: Christian [name] MRN: [id number] DOB: 1940-11-30 Today's Date: 12/19/2017    History of Present Illness Christian [name]  is a 77 y.o. male, h/o PE/ DVT, (prothrombin gene mutation), hypertension, hyperlipidemia, ? Diastolic CHF, palitations on flecainide, cognitive decline who apparently was last seen well by wife around 10 am when she left for work.  He wasn't answering his phone so she had a friend check on him and he was unresponsive and EMS summoned.  Pt time EMS arrived he was responding but with slurred speech,   There was some question of right facial droop.  Pt was drinking 1.5 pints of liquor today because he felt lonely.     Clinical Impression   Pt received supine in bed, agreeable to OT evaluation. Pt demonstrates generalized weakness of BUE, increased time required for task completion. During evaluation pt completing seated ADLs at supervision level, standing ADLs with min guard due to unsteadiness and occasional LOB. PTA pt was independent in all B/IADLs including feeding his goats and chickens. Recommend SNF on discharge to improve safety and independence in ADL completion and return to highest level of functioning during daily tasks.     Follow Up Recommendations  SNF;Supervision/Assistance - 24 hour    Equipment Recommendations  None recommended by OT       Precautions / Restrictions Precautions Precautions: Fall Restrictions Weight Bearing Restrictions: No      Mobility Bed Mobility Overal bed mobility: Needs Assistance Bed Mobility: Supine to Sit     Supine to sit: Supervision        Transfers Overall transfer level: Needs assistance Equipment used: None Transfers: Sit to/from Stand Sit to Stand: Min guard                  ADL either performed or assessed with clinical judgement   ADL Overall ADL's : Needs assistance/impaired     Grooming: Wash/dry hands;Wash/dry face;Min guard;Standing                Lower Body Dressing: Supervision/safety;Sitting/lateral leans   Toilet Transfer: Min guard;Ambulation;Regular Museum/gallery exhibitions officer and Hygiene: Supervision/safety;Sitting/lateral lean       Functional mobility during ADLs: Min guard       Vision Baseline Vision/History: No visual deficits Patient Visual Report: No change from baseline Vision Assessment?: No apparent visual deficits            Pertinent Vitals/Pain Pain Assessment: No/denies pain     Hand Dominance Right   Extremity/Trunk Assessment Upper Extremity Assessment Upper Extremity Assessment: Generalized weakness   Lower Extremity Assessment Lower Extremity Assessment: Defer to PT evaluation   Cervical / Trunk Assessment Cervical / Trunk Assessment: Normal   Communication Communication Communication: No difficulties   Cognition Arousal/Alertness: Awake/alert Behavior During Therapy: WFL for tasks assessed/performed Overall Cognitive Status: Within Functional Limits for tasks assessed                                                Home Living   Living Arrangements: Spouse/significant other Available Help at Discharge: Family Type of Home: House Home Access: Stairs to enter Technical brewer of Steps: 1 Entrance Stairs-Rails: None Home Layout: Multi-level Alternate Level Stairs-Number of Steps: 11-12 steps to basement Alternate Level Stairs-Rails: Right           Home Equipment:  Cane - single point;Shower seat - built in          Prior Functioning/Environment Level of Independence: Independent with assistive device(s)        Comments: Pt reports he occasionally uses SPC for functional mobility. Reports independence in ADLs        OT Problem List: Decreased strength;Decreased activity tolerance;Impaired balance (sitting and/or standing);Decreased safety awareness;Decreased knowledge of use of DME or AE    End of Session  Equipment Utilized During Treatment: Gait belt  Activity Tolerance: Patient tolerated treatment well Patient left: in chair;with call bell/phone within reach  OT Visit Diagnosis: Unsteadiness on feet (R26.81);Muscle weakness (generalized) (M62.81)                Time: 9449-6759 OT Time Calculation (min): 27 min Charges:  OT General Charges $OT Visit: 1 Visit OT Evaluation $OT Eval Low Complexity: [place], OTR/L  [phone] 12/19/2017, 8:08 AM

## 2017-12-19 NOTE — Evaluation (Signed)
Speech Language Pathology Evaluation Patient Details Name: Christian [name] MRN: [id number] DOB: 30-Jan-1941 Today's Date: 12/19/2017 Time:  -     Problem List:  Patient Active Problem List   Diagnosis Date Noted  . Stroke ([place]) 12/16/2017  . Alcohol intoxication ([place]) 12/16/2017  . Wears hearing aid 06/09/2017  . Tinnitus of both ears 06/09/2017  . Impacted cerumen of right ear 06/09/2017  . Left wrist pain 06/26/2016  . Morning joint stiffness 06/26/2016  . Polyarthralgia 06/26/2016  . Type 2 diabetes mellitus with complication, without long-term current use of insulin ([place]) 04/02/2016  . Gait disturbance 04/02/2016  . Physical deconditioning 04/02/2016  . Pain of both shoulder joints 04/02/2016  . Leg weakness, bilateral 04/02/2016  . Encounter for therapeutic drug monitoring 11/29/2015  . Encounter for health maintenance examination in adult 10/30/2015  . Shoulder pain, bilateral 10/30/2015  . Muscle weakness 10/30/2015  . Generalized weakness 10/30/2015  . Skin lesion 10/30/2015  . PVC (premature ventricular contraction) 10/10/2015  . Diabetes type 2, controlled ([place]) 06/20/2015  . Decreased energy 06/20/2015  . Pulmonary embolism ([place]) 06/20/2015  . DVT (deep venous thrombosis) ([place]) 06/20/2015  . Hammer toe of left foot 06/20/2015  . Pre-ulcerative calluses 06/20/2015  . Hypertrophic toenail 06/20/2015  . Diabetic mononeuropathy associated with diabetes mellitus due to underlying condition ([place]) 06/20/2015  . Leg pain, bilateral 06/20/2015  . Chronic low back pain 06/20/2015  . 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 06/14/2015  . Diabetes type 2, uncontrolled ([place]) 03/06/2015  . OSA on CPAP 03/06/2015  . Edema 03/06/2015  . Leg pain, diffuse 03/06/2015  . Bilateral low back pain without sciatica 03/06/2015  . Tremor 03/06/2015  . Insomnia 03/06/2015  . Vaccine counseling 03/06/2015  . Chronic back pain 03/06/2015   . Wears glasses 03/06/2015  . Hearing loss 03/06/2015  . Spinal stenosis of lumbar region 03/06/2015  . Ptosis 03/06/2015  . Pulmonary emboli ([place]) 03/06/2015  . Positive depression screening 03/06/2015  . Advance directive discussed with patient 03/06/2015  . Bradycardia 02/27/2015  . Long-term (current) use of anticoagulants   . Dementia   . Essential hypertension 02/08/2015  . Hyperlipidemia 02/08/2015  . SOB (shortness of breath) 02/08/2015  . OSA (obstructive sleep apnea) 02/08/2015  . Mild cognitive impairment 02/08/2015  . PE (pulmonary embolism) 01/31/2015   Past Medical History:  Past Medical History:  Diagnosis Date  . Agitation   . Arthritis   . Cataract   . Chronic back pain   . Confusion   . Diabetes ([place])    type 2  . DVT (deep venous thrombosis) ([place]) 01/2015  . Hearing impaired   . High cholesterol   . Hypertension   . Insomnia   . Insomnia   . Leg pain, diffuse   . Leg swelling   . Memory loss   . Mild cognitive impairment    sees [facility]  . Numbness    fingers, feet, toes  . OSA on CPAP   . 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 06/14/2015  . Pulmonary embolism ([place]) 01/2015  . Spinal stenosis    lumbar  . Tremor    on propranolol  . Wears glasses    Past Surgical History:  Past Surgical History:  Procedure Laterality Date  . CATARACT EXTRACTION    . COLONOSCOPY  2014  . TONSILLECTOMY     HPI:  Pt is a 77 y.o. male PMH  PE/ DVT, (prothrombin gene mutation), hypertension, hyperlipidemia, ? Diastolic CHF, palitations on flecainide, cognitive decline who was found unresponsive on 1/22 and EMS summoned. Pt time EMS arrived he was responding but with slurred speech, there was some question of right facial droop. Pt was drinking 1.5 pints of liquor today because he felt lonely. Head CT showed possible acute R inferior, posterior frontal lobe; however MRI indicated no acute or subacute  infarct and finding on CT was felt to be an artifact. Cognitive linguistic eval ordered as part of stroke workup.   Assessment / Plan / Recommendation Clinical Impression  Pt currently showing mild cognitive deficits which he feels is at baseline. Recalled 0 out of 5 words after 5-minute delay but recalled 5 out of 5 with semantic cues, also difficulty with divergent naming and visuospatial/ executive function tasks. Receptive/ expressive language and motor speech WFL for tasks assessed with some additional processing time; pt reports word finding difficulties at home but this was not evident in conversation. Pt is aware of cognitive deficits and is followed by [facility], seems a bit down when discussing, expressing some feelings of helplessness. Recommend continued speech therapy at next level of care; pt agrees but somewhat reluctant. Will sign off for acute care needs. Please re-consult if needs arise.    SLP Assessment  SLP Recommendation/Assessment: All further Speech Lanaguage Pathology  needs can be addressed in the next venue of care SLP Visit Diagnosis: Cognitive communication deficit (R41.841)    Follow Up Recommendations  Home health SLP vs Skilled Nursing facility    Frequency and Duration           SLP Evaluation Cognition  Overall Cognitive Status: History of cognitive impairments - at baseline Arousal/Alertness: Awake/alert Orientation Level: Oriented X4 Attention: Selective Selective Attention: Appears intact Memory: Impaired Memory Impairment: Decreased recall of new information;Decreased short term memory Decreased Short Term Memory: Verbal basic Awareness: Appears intact Problem Solving: Impaired Problem Solving Impairment: Functional basic Safety/Judgment: Appears intact       Comprehension  Auditory Comprehension Overall Auditory Comprehension: Appears within functional limits for tasks assessed Yes/No Questions: Within Functional Limits Commands:  Within Functional Limits Conversation: Complex Reading Comprehension Reading Status: Unable to assess (comment)    Expression Expression Primary Mode of Expression: Verbal Verbal Expression Overall Verbal Expression: Appears within functional limits for tasks assessed Initiation: No impairment Level of Generative/Spontaneous Verbalization: Conversation Repetition: No impairment Naming: Impairment Divergent: 50-74% accurate Pragmatics: No impairment Non-Verbal Means of Communication: Not applicable   Oral / Motor  Oral Motor/Sensory Function Overall Oral Motor/Sensory Function: Within functional limits Motor Speech Overall Motor Speech: Appears within functional limits for tasks assessed Intelligibility: Intelligible   GO                    Brewer Hitchman Dionicia Abler, MA, CCC-SLP 12/19/2017, 4:59 PM

## 2017-12-19 NOTE — Telephone Encounter (Signed)
forwarding to you  °

## 2017-12-19 NOTE — Progress Notes (Signed)
PHARMACIST - PHYSICIAN COMMUNICATION  DR:   TRH  CONCERNING: IV to Oral Route Change Policy  RECOMMENDATION: This patient is receiving Folic Acid and Thiamine by the intravenous route.  Based on criteria approved by the Pharmacy and Therapeutics Committee, the intravenous medication(s) is/are being converted to the equivalent oral dose form(s).   DESCRIPTION: These criteria include:  The patient is eating (either orally or via tube) and/or has been taking other orally administered medications for a least 24 hours  The patient has no evidence of active gastrointestinal bleeding or impaired GI absorption (gastrectomy, short bowel, patient on TNA or NPO).  If you have questions about this conversion, please contact the Pharmacy Department  [x]   [phone] )  [facility] []   [phone] )  [facility] []   [phone] )  [facility] []   [phone] )  [facility] []   [phone] )  [place], [facility] 12/19/2017 10:18 AM

## 2017-12-19 NOTE — Progress Notes (Signed)
Pt discharged home today per Dr. Denton Brick. Pt's IV site D/C'd and WDL. Pt's VSS. Pt provided with home medication list, discharge instructions and prescriptions. Verbalized understanding. Currently awaiting arrival of wife to transport home.

## 2017-12-21 ENCOUNTER — Other Ambulatory Visit: Payer: Self-pay | Admitting: Medical

## 2017-12-21 ENCOUNTER — Other Ambulatory Visit: Payer: Self-pay | Admitting: Nurse Practitioner

## 2017-12-22 ENCOUNTER — Telehealth: Payer: Self-pay | Admitting: Family Medicine

## 2017-12-22 NOTE — Telephone Encounter (Signed)
Called patient t/w wife.  Wife is on HIPAA.  She states patient is aware of his condition and treatment.  Home health just left.  There were some medication to change but they are not agreeable to changing any meds until they talk with [name] at their next appt.  Patients appt 12/29/17.  They would not take an earlier appt due to time of day too early. ADL pt is still independent.  No further questions or concerns for Transition of Care call.

## 2017-12-26 ENCOUNTER — Other Ambulatory Visit: Payer: Self-pay | Admitting: Medical

## 2017-12-26 NOTE — Telephone Encounter (Signed)
Called and l/m on pt's wife voicemail to call us back to let us know if pt needs a refill on meds.

## 2017-12-29 ENCOUNTER — Encounter: Payer: Self-pay | Admitting: Medical

## 2017-12-29 ENCOUNTER — Ambulatory Visit (INDEPENDENT_AMBULATORY_CARE_PROVIDER_SITE_OTHER): Payer: Medicare Other | Admitting: Medical

## 2017-12-29 ENCOUNTER — Telehealth: Payer: Self-pay | Admitting: Medical

## 2017-12-29 VITALS — BP 132/80 | HR 64 | Wt 218.6 lb

## 2017-12-29 DIAGNOSIS — E0841 Diabetes mellitus due to underlying condition with diabetic mononeuropathy: Secondary | ICD-10-CM

## 2017-12-29 DIAGNOSIS — F339 Major depressive disorder, recurrent, unspecified: Secondary | ICD-10-CM

## 2017-12-29 DIAGNOSIS — Z86711 Personal history of pulmonary embolism: Secondary | ICD-10-CM

## 2017-12-29 DIAGNOSIS — E785 Hyperlipidemia, unspecified: Secondary | ICD-10-CM | POA: Diagnosis not present

## 2017-12-29 DIAGNOSIS — F0390 Unspecified dementia without behavioral disturbance: Secondary | ICD-10-CM

## 2017-12-29 DIAGNOSIS — I10 Essential (primary) hypertension: Secondary | ICD-10-CM | POA: Diagnosis not present

## 2017-12-29 DIAGNOSIS — Z7901 Long term (current) use of anticoagulants: Secondary | ICD-10-CM

## 2017-12-29 DIAGNOSIS — Z9181 History of falling: Secondary | ICD-10-CM | POA: Diagnosis not present

## 2017-12-29 DIAGNOSIS — E118 Type 2 diabetes mellitus with unspecified complications: Secondary | ICD-10-CM

## 2017-12-29 DIAGNOSIS — F1010 Alcohol abuse, uncomplicated: Secondary | ICD-10-CM | POA: Insufficient documentation

## 2017-12-29 DIAGNOSIS — R5381 Other malaise: Secondary | ICD-10-CM | POA: Diagnosis not present

## 2017-12-29 MED ORDER — PAROXETINE HCL 10 MG PO TABS: 10.0000 mg | ORAL_TABLET | Freq: Every day | ORAL | 2 refills | Status: DC

## 2017-12-29 NOTE — Patient Instructions (Addendum)
Recommendations:  Please have home health nurse call us to review your home status and medications and needs  Continue plan for speech therapy and home health visits  I completed your handicap placard card today  We reviewed your medications today  New/Recent medications:  Thiamine  Folic Acid  And Paxil started today for mood   Depression  Please establish with a therapist/counselor  I think seeing a counselor would be helpful  I recommend you stop alcohol completely  Begin Paroxetine/Paxil 1 tablet daily in the morning  Dementia  continue Aricept 10mg  tablet daily at bedtime  Continue Namenda 10mg , 1 tablet twice daily  Continue Thiamine 100mg , 1 tablet daily in the morning  Continue Folic acid 1mg , 1 tablet daily in the morning  Atrial fibrillation  Continue Cardizem CD 120 mg, 1 capsule daily in the morning  Continue Flecainide 150mg , 1/2 tablet twice daily  Continue Xarelto 20mg , 1 tablet daily at bedtime for anticoagulation  Enlarged prostate  Continue Flomax 0.4mg , 1 capsule daily in the morning  High cholesterol  Continue Crestor 10mg , 1 tablet daily at bedtime  Continue Aspirin 81mg , 1 tablet daily at bedtime  Diabetes  Jardiance 10mg  once daily in the morning  Metformin 850mg  twice daily  Continue Tresiba 17 units daily at bedtime  Continue Poly-iron/iron 150mg , 1 tablet by daily in the morning with food  DO NOT TAKE the following [facility] [address], [place], [place] [address] [phone]    [facility] [phone]  [email or web address] [address] [place], [place], [place] [address]   Merrianne M. Clarene Reamer, therapist [phone] [address] [place], [place] [address]   Vic Ripper, therapist [phone] [address] [place], [place] [address]   [place]  [facility] [phone] [address] [place], [place], [place] [address]

## 2017-12-29 NOTE — Progress Notes (Signed)
Subjective: Chief Complaint  Patient presents with  . Hospitalization Follow-up    hospital follow up dicuss   Here for hospital f/u.  Accompanied by wife as usual today.  Admitted 12/16/17 - 12/19/17 for alcohol intoxication, complication, CVA, physical deconditioning.   Active problems   Essential hypertension   Dementia   Diabetes type 2, controlled ([place])   Decreased energy   Pulmonary embolism (HCC)   Generalized weakness   Gait disturbance   Alcohol intoxication ([place])  I reviewed the hospital discharge summary and hospital records from 12/19/17.  EMS was called out to the home on 12/16/17 after wife could not reach him by phone.  He was found to be unresponsive and had right facial droop.  He reportedly does not drink alcohol on a regular basis but reportedly that day he had drank 1.5 pints of liquor.  Blood alcohol level in the emergency department was 211.   History today provided by wife and patient.  They do not have their medications with him today and when CMA was going over their medications there apparently was confusion over what he is supposed to be taking.  He reports that he does not get out of the house enough, used to work on tractors, but he does not necessarily have the energy to do things for very long.  He denies any falls.  He knows that he likes just to get out of the house.  He does walk around the yard some feeding the goats.  He notes that his wife will not answer the phone some of the times when he is trying to call her when she is away.  His wife [name] reports that he is very jealous, that she cannot even leave the house without him calling and wanting her attention all the time.   Wife is taking care of her aunt who is undergoing chemotherapy who lives at home.  And she also visits her father who is in a nursing facility regularly.  She notes that she has to do this as there is no one else to help out.  But this takes away time from being with her husband.  She  notes that this recent issue with the alcohol intoxication was attention seeking to get her attention.  She says he does not have the strength or stamina to get out of the house and do the things he wants to do.  She knows that she has arranged in the past year for him to go do physical therapy but he wants to do it likewise she bought him a exerciser that he can use at his chair but he will not use this   his license were withheld last year through neurologist.  They have a follow-up appointment was given neurology in March  [place] -home health nurse came out to the house this past week post hospital discharge to start home health visits.  Diabetes -he is compliant with Jardiance, metformin, Tyler Aas.  He notes that his pharmacy keeps trying to push Invokana on him per the insurer but he is not taking Invokana.  He reports home blood sugar readings 130 or less consistently.  His wife notes that he drinks on average half a glass of wine twice a week with dinner.  This recent alcohol intoxication is unlike him  He is compliant with his other medications including flecainide, Xarelto, Cardizem, Crestor, Flomax, Aricept, Namenda.  He does note that he is down depressed sometimes, attributes this to being  confined to the home.  he says he is agreeable to a trial of antidepressant which was recommended by the hospital.  he is currently not seeing a therapist or counselor.  Denies any recent falls.  He reports gum bleeding anytime he brushes teeth, but no other bleeding or bruising reported.    Past Medical History:  Diagnosis Date  . Agitation   . Arthritis   . Cataract   . Chronic back pain   . Confusion   . Diabetes ([place])    type 2  . DVT (deep venous thrombosis) ([place]) 01/2015  . Hearing impaired   . High cholesterol   . Hypertension   . Insomnia   . Insomnia   . Leg pain, diffuse   . Leg swelling   . Memory loss   . Mild cognitive impairment    sees [facility]   . Numbness    fingers, feet, toes  . OSA on CPAP   . 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 06/14/2015  . Pulmonary embolism ([place]) 01/2015  . Spinal stenosis    lumbar  . Tremor    on propranolol  . Wears glasses    Current Outpatient Medications on File Prior to Visit  Medication Sig Dispense Refill  . aspirin EC 81 MG tablet Take 1 tablet (81 mg total) by mouth daily. (Patient not taking: Reported on 12/29/2017) 30 tablet 2  . diltiazem (CARDIZEM CD) 120 MG 24 hr capsule Take 1 capsule (120 mg total) by mouth daily. 30 capsule 1  . donepezil (ARICEPT) 10 MG tablet Take 1 tablet (10 mg total) by mouth at bedtime. 30 tablet 1  . empagliflozin (JARDIANCE) 10 MG TABS tablet 1/2 tablet daily (Patient taking differently: Take 10 mg by mouth daily. ) 90 tablet 1  . flecainide (TAMBOCOR) 150 MG tablet Take 0.5 tablets (75 mg total) by mouth 2 (two) times daily. 30 tablet 1  . folic acid (FOLVITE) 1 MG tablet Take 1 tablet (1 mg total) by mouth daily. 30 tablet 1  . insulin degludec (TRESIBA FLEXTOUCH) 100 UNIT/ML SOPN FlexTouch Pen Inject 0.15 mLs (15 Units total) daily into the skin. 5 pen 0  . insulin degludec (TRESIBA) 100 UNIT/ML SOPN FlexTouch Pen Inject 0.15 mLs (15 Units total) daily at 10 pm into the skin. (Patient not taking: Reported on 12/16/2017) 6 mL 2  . Insulin Pen Needle (BD PEN NEEDLE NANO U/F) 32G X 4 MM MISC 1 each by Does not apply route daily. DX E11.9 100 each 3  . INVOKAMET [phone] MG TABS TAKE 1 TABLET BY MOUTH TWICE DAILY (Patient not taking: Reported on 12/16/2017) 180 tablet 0  . JARDIANCE 10 MG TABS tablet TAKE 1 TABLET BY MOUTH DAILY 60 tablet 0  . memantine (NAMENDA) 10 MG tablet Take 1 tablet (10 mg total) by mouth 2 (two) times daily. 60 tablet 2  . metFORMIN (GLUCOPHAGE) 850 MG tablet Take 1 tablet (850 mg total) by mouth 2 (two) times daily with a meal. 60 tablet 3  . Misc Natural Products (OSTEO BI-FLEX ADV JOINT  SHIELD PO) Take 1 tablet by mouth daily.     [name] POLY-IRON 150 150 MG capsule TAKE 1 CAPSULE(150 MG) BY MOUTH DAILY 90 capsule 0  . rivaroxaban (XARELTO) 20 MG TABS tablet TAKE 1 TABLET(20 MG) BY MOUTH DAILY WITH SUPPER 30 tablet 1  . rosuvastatin (CRESTOR) 10 MG tablet Take 1 tablet (10 mg total) by mouth at bedtime. [place]  tablet 1  . SYMBICORT 160-4.5 MCG/ACT inhaler INHALE 2 PUFFS INTO THE LUNGS TWICE DAILY 10.2 g 0  . tamsulosin (FLOMAX) 0.4 MG CAPS capsule Take 1 capsule (0.4 mg total) by mouth daily. 30 capsule 1  . tamsulosin (FLOMAX) 0.4 MG CAPS capsule TAKE ONE CAPSULE BY MOUTH DAILY 90 capsule 0  . thiamine 100 MG tablet Take 1 tablet (100 mg total) by mouth daily. 30 tablet 1  . TRUE METRIX BLOOD GLUCOSE TEST test strip TEST TWICE DAILY AS DIRECTED 100 each 1  . TRUE METRIX BLOOD GLUCOSE TEST test strip TEST TWICE DAILY AS DIRECTED 100 each 2   No current facility-administered medications on file prior to visit.    ROS as in subjective    Objective: BP 132/80   Pulse 64   Wt 218 lb 9.6 oz (99.2 kg)   SpO2 95%   BMI 30.49 kg/m   General appearance: alert, no distress, WD/WN,  HEENT: normocephalic, sclerae anicteric, TMs pearly, nares patent, no discharge or erythema, pharynx normal Oral cavity: MMM, no lesions, teeth in good repair Neck: supple, no lymphadenopathy, no thyromegaly, no masses Heart: RRR, normal S1, S2, no murmurs Lungs: CTA bilaterally, no wheezes, rhonchi, or rales Pulses: 2+ symmetric, upper and lower extremities, normal cap refill Neuro: CN2-12 intact, A&Ox 3, strength slightly decreased left UE and LE, but otherwise non focal exam Psych: pleasant, god eye contact, seems more irritated today than prior visits    Assessment: Encounter Diagnoses  Name Primary?  . Alcohol abuse Yes  . Essential hypertension   . Type 2 diabetes mellitus with complication, without long-term current use of insulin ([place])   . Diabetic mononeuropathy associated with diabetes  mellitus due to underlying condition ([place])   . Dementia without behavioral disturbance, unspecified dementia type   . Physical deconditioning   . Current use of long term anticoagulation   . History of pulmonary embolism   . Depression, recurrent ([place])   . Hyperlipidemia, unspecified hyperlipidemia type       Plan: I reviewed the hospital discharge summary, HPI, labs, head scans and MRA.  I asked his wife [name] to have the home health nurse called here this week as I would like a report on the home health situation as well as medicine reconciliation.  I completed his handicap placard card today  We discussed fall risks/preventing falls.  c/t to use cane when ambulating.   Will prescribe walker.  Strongly encouraged counseling.   Begin trial of Paxil today, discussed risks/benefits of medications.  physical deconditioning - discussed using the local therapy office to go for physical conditioning/rehab and encouraged him to do home exercises within reason.     I reviewed back through his prior outpatient neurology, cardiology, oncology/hematology visit notes as well as prior PFT, most recent echocardiogram and labs.  Given the history of pulmonary embolism and DVT and Atrial fibrillation,he will likely need lifelong anticoagulation.  He also is high for fall risk and balance issues.   At one point back in 2017 cardiology has switched him to Coumadin but he was not tolerating this and wanted to go back on Xarelto which he has been taking since.  His discharge summary paperwork from the hospital also shows aspirin which he has not been taking.  After discussing his case with Dr. Redmond School my supervising physician, we advised he continue Xarelto but stop aspirin.  We feel that the benefits outweigh the risk of Xarelto particular compared to Coumadin  Depression  Please establish  with a therapist/counselor  I think seeing a counselor would be helpful  I recommend you stop alcohol  completely  Begin Paroxetine/Paxil 1 tablet daily in the morning  Dementia  continue Aricept 10mg  tablet daily at bedtime  Continue Namenda 10mg , 1 tablet twice daily  Continue Thiamine 100mg , 1 tablet daily in the morning  Continue Folic acid 1mg , 1 tablet daily in the morning  Follow up with neurology as planned  Atrial fibrillation  Continue Cardizem CD 120 mg, 1 capsule daily in the morning  Continue Flecainide 150mg , 1/2 tablet twice daily  Continue Xarelto 20mg , 1 tablet daily at bedtime for anticoagulation  Enlarged prostate  Continue Flomax 0.4mg , 1 capsule daily in the morning  High cholesterol  Continue Crestor 10mg , 1 tablet daily at bedtime  Diabetes  Jardiance 10mg  once daily in the morning  Metformin 850mg  twice daily  Continue Tresiba 18 units daily at bedtime  Continue Poly-iron/iron 150mg , 1 tablet by daily in the morning with food   DO NOT TAKE the following Invokana Invokamet Aspirin  [place] was seen today for hospitalization follow-up.  Diagnoses and all orders for this visit:  Alcohol abuse  Essential hypertension  Type 2 diabetes mellitus with complication, without long-term current use of insulin (HCC)  Diabetic mononeuropathy associated with diabetes mellitus due to underlying condition ([place])  Dementia without behavioral disturbance, unspecified dementia type  Physical deconditioning  Current use of long term anticoagulation  History of pulmonary embolism  Depression, recurrent (HCC)  Hyperlipidemia, unspecified hyperlipidemia type  Other orders -     Discontinue: PARoxetine (PAXIL) 10 MG tablet; Take 1 tablet (10 mg total) by mouth daily. -     PARoxetine (PAXIL) 10 MG tablet; Take 1 tablet (10 mg total) by mouth daily.

## 2017-12-29 NOTE — Telephone Encounter (Signed)
As a follow-up to yesterday's visit, let him know that I discussed his case with Dr. Redmond School my supervising physician.  Please make sure he does NOT take the following medications: Invokana Aspirin  Dr. Redmond School and I both felt like he should pursue counseling to help with depressed mood and feeling lonely at times   I recommend he make some goals daily to work on his exercise tolerance a little at a time.  For example extend his time walking by at least 2 minutes every day.  Have weekly goals as well.  Make sure they have home health nurse call me within the next 2 days  Please call out a walker that he can have on hand for walking around in the yard or in the house to help prevent falls  Follow-up in 3 weeks on Paxil

## 2017-12-30 NOTE — Telephone Encounter (Signed)
Called and l/m for pt's wife  Call  us back

## 2017-12-31 DIAGNOSIS — Z9181 History of falling: Secondary | ICD-10-CM | POA: Insufficient documentation

## 2017-12-31 NOTE — Telephone Encounter (Signed)
Spoke with [name] his wife when over this information with her , she said that she gave  The home health nurse our card and asked her to give [place] a call. Made him an appt to come back in 3 weeks for a follow up visit to discuss new med Also pt will need a hand written rx for walker because of his insurance.

## 2017-12-31 NOTE — Telephone Encounter (Signed)
See script.

## 2017-12-31 NOTE — Telephone Encounter (Signed)
Called pt home the line was busy and call his wife cell phone l/m for her to call us back.

## 2018-01-02 ENCOUNTER — Telehealth: Payer: Self-pay

## 2018-01-02 NOTE — Telephone Encounter (Signed)
Hinton Dyer with [facility] call with an update on patient his b/p was 140/70 and he did get an got out an walk to day . She said  that he just started the paxil 3 days ago has not seen a change in his mood yet.his weight has not changed  If you have any question for her please call he at [phone]

## 2018-01-02 NOTE — Telephone Encounter (Signed)
It takes about 2+ weeks to start to see improvement in mood on medication.  Try to make a little bit more effort every day, set goals for yourself.    Also, I want to speak to home health nurse, so lets get her on the phone Monday

## 2018-01-05 NOTE — Telephone Encounter (Signed)
Ok just let me when get minute to speck with her.

## 2018-01-07 ENCOUNTER — Other Ambulatory Visit: Payer: Self-pay | Admitting: Medical

## 2018-01-12 NOTE — Telephone Encounter (Signed)
Of note, let them know that I spoke with home health nurse last week.  She was impressed with how much he walked when she has been out there.  There may be concerns about expectations between [place] and [place].  I want to see Christian [name] back in 2 weeks to discussed Paxil, his activity level, his mood.  I want to have a chance to speak with him separately first and then them together.

## 2018-01-20 ENCOUNTER — Other Ambulatory Visit: Payer: Self-pay | Admitting: Nurse Practitioner

## 2018-01-21 ENCOUNTER — Encounter: Payer: Self-pay | Admitting: Medical

## 2018-01-21 ENCOUNTER — Ambulatory Visit (INDEPENDENT_AMBULATORY_CARE_PROVIDER_SITE_OTHER): Payer: Medicare Other | Admitting: Medical

## 2018-01-21 VITALS — BP 110/70 | HR 80 | Wt 222.0 lb

## 2018-01-21 DIAGNOSIS — M549 Dorsalgia, unspecified: Secondary | ICD-10-CM | POA: Diagnosis not present

## 2018-01-21 DIAGNOSIS — R5381 Other malaise: Secondary | ICD-10-CM

## 2018-01-21 DIAGNOSIS — I639 Cerebral infarction, unspecified: Secondary | ICD-10-CM

## 2018-01-21 DIAGNOSIS — F1010 Alcohol abuse, uncomplicated: Secondary | ICD-10-CM

## 2018-01-21 DIAGNOSIS — G8929 Other chronic pain: Secondary | ICD-10-CM

## 2018-01-21 DIAGNOSIS — F0390 Unspecified dementia without behavioral disturbance: Secondary | ICD-10-CM | POA: Diagnosis not present

## 2018-01-21 DIAGNOSIS — R4586 Emotional lability: Secondary | ICD-10-CM

## 2018-01-21 NOTE — Progress Notes (Signed)
Subjective: Chief Complaint  Patient presents with  . Follow-up    follow up from new meds    Here for f/u at my request.  Here in the room alone today, wife is out in the car.  Usually wife is here answering most of the questions  I saw him recently with his wife for emergency dept f/u after he was seen for acute alcohol intoxiciatino.  Last visit I started him on Paxil and recommended counseling.  I also ordered a home healthy eval last visit as their were concerns about his mobility, physical deconditioning and lack of motivation.    After home healthy nurse had been a out a few times, I called and spoke to her.  She informed me of some interesting things including that one day she was out there, Arrthus walked over to a neighbor's house who lived at least a 1/2 mile away.  She said [place] barely see the house. She also noted that [name] his wife was quite negative and condescending to him.     He notes today that he tends to not say a whole lot as to keep the peace at home.  He says [name] doesn't mean harm but can be negative in her words or tone.  He is sad that he only has a brother and some cousins left alive.  Most of his family is dead.  He is frustrated that [place] doesn't mind spending time with and helping her family but never wants to have anything to do with his family.  He also notes that he and [name] don't see eye to eye on issues of race and politics.     Past Medical History:  Diagnosis Date  . Agitation   . Arthritis   . Cataract   . Chronic back pain   . Confusion   . Diabetes ([place])    type 2  . DVT (deep venous thrombosis) ([place]) 01/2015  . Hearing impaired   . High cholesterol   . Hypertension   . Insomnia   . Insomnia   . Leg pain, diffuse   . Leg swelling   . Memory loss   . Mild cognitive impairment    sees [facility]  . Numbness    fingers, feet, toes  . OSA on CPAP   . Prothrombin G20210A  mutation, heterozygous, 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 06/14/2015  . Pulmonary embolism ([place]) 01/2015  . Spinal stenosis    lumbar  . Tremor    on propranolol  . Wears glasses    Current Outpatient Medications on File Prior to Visit  Medication Sig Dispense Refill  . aspirin EC 81 MG tablet Take 1 tablet (81 mg total) by mouth daily. 30 tablet 2  . diltiazem (CARDIZEM CD) 120 MG 24 hr capsule Take 1 capsule (120 mg total) by mouth daily. 30 capsule 1  . donepezil (ARICEPT) 10 MG tablet Take 1 tablet (10 mg total) by mouth at bedtime. 30 tablet 1  . empagliflozin (JARDIANCE) 10 MG TABS tablet 1/2 tablet daily (Patient taking differently: Take 10 mg by mouth daily. ) 90 tablet 1  . flecainide (TAMBOCOR) 150 MG tablet Take 0.5 tablets (75 mg total) by mouth 2 (two) times daily. 30 tablet 1  . folic acid (FOLVITE) 1 MG tablet Take 1 tablet (1 mg total) by mouth daily. 30 tablet 1  . insulin degludec (TRESIBA FLEXTOUCH) 100  UNIT/ML SOPN FlexTouch Pen Inject 0.15 mLs (15 Units total) daily into the skin. 5 pen 0  . insulin degludec (TRESIBA) 100 UNIT/ML SOPN FlexTouch Pen Inject 0.15 mLs (15 Units total) daily at 10 pm into the skin. 6 mL 2  . Insulin Pen Needle (BD PEN NEEDLE NANO U/F) 32G X 4 MM MISC 1 each by Does not apply route daily. DX E11.9 100 each 3  . JARDIANCE 10 MG TABS tablet TAKE 1 TABLET BY MOUTH DAILY 60 tablet 0  . memantine (NAMENDA) 10 MG tablet Take 1 tablet (10 mg total) by mouth 2 (two) times daily. 60 tablet 2  . metFORMIN (GLUCOPHAGE) 850 MG tablet Take 1 tablet (850 mg total) by mouth 2 (two) times daily with a meal. 60 tablet 3  . Misc Natural Products (OSTEO BI-FLEX ADV JOINT SHIELD PO) Take 1 tablet by mouth daily.     [name] PARoxetine (PAXIL) 10 MG tablet Take 1 tablet (10 mg total) by mouth daily. 30 tablet 2  . POLY-IRON 150 150 MG capsule TAKE 1 CAPSULE(150 MG) BY MOUTH DAILY 90 capsule 0  . rivaroxaban (XARELTO) 20 MG TABS tablet TAKE 1 TABLET(20 MG) BY  MOUTH DAILY WITH SUPPER 30 tablet 1  . rosuvastatin (CRESTOR) 10 MG tablet Take 1 tablet (10 mg total) by mouth at bedtime. 30 tablet 1  . SYMBICORT 160-4.5 MCG/ACT inhaler INHALE 2 PUFFS INTO THE LUNGS TWICE DAILY 10.2 g 0  . tamsulosin (FLOMAX) 0.4 MG CAPS capsule Take 1 capsule (0.4 mg total) by mouth daily. 30 capsule 1  . tamsulosin (FLOMAX) 0.4 MG CAPS capsule TAKE ONE CAPSULE BY MOUTH DAILY 90 capsule 0  . thiamine 100 MG tablet Take 1 tablet (100 mg total) by mouth daily. 30 tablet 1  . TRUE METRIX BLOOD GLUCOSE TEST test strip TEST TWICE DAILY AS DIRECTED 100 each 1  . TRUE METRIX BLOOD GLUCOSE TEST test strip TEST TWICE DAILY AS DIRECTED 100 each 2  . INVOKAMET [phone] MG TABS TAKE 1 TABLET BY MOUTH TWICE DAILY (Patient not taking: Reported on 12/16/2017) 180 tablet 0   No current facility-administered medications on file prior to visit.    ROS as in subjective    Objective: BP 110/70   Pulse 80   Wt 222 lb (100.7 kg)   SpO2 97%   BMI 30.96 kg/m   General appearance: alert, no distress, WD/WN,  Psych: pleasant, good eye contact, answers questions more open and candidly today    Assessment: Encounter Diagnoses  Name Primary?  . Mood change Yes  . Dementia without behavioral disturbance, unspecified dementia type   . Alcohol abuse   . Chronic back pain, unspecified back location, unspecified back pain laterality   . Physical deconditioning       Plan: We had some interesting conversation today.  I get the impression that he feels constraints at home.  He can't drive ,relies on [name] but is frustrated he can't get out and do certain things.  He is bothered by the fact that his wife wont just let him sit around and watch tv and be left alone.   He seems to accept that he is the more passive person in the relationship with wife being dominant but yet seems to be resigned to negative words and some argumentative tone at times.     I strongly recommended he and wife do  some marital counseling.  C/T paxil.    I encouraged him to get outside and exercise, and  gradually increase his exercise tolerance.   I was surprised to hear how well he can be active at home although last visit it was portrayed that he doesn't do much other than watch tv and walk over to the shed/barn to feed the goats.    He notes no significant back pain since last viist.  He is compliant with medications  F/u 1-2 months.  Spent > 45 minutes face to face with patient in discussion of symptoms, evaluation, plan and recommendations.

## 2018-01-21 NOTE — Patient Instructions (Signed)
Counseling Services (NON- psychiatrist offices)  [facility] [address] [place], [place] [address] [phone]   [facility] [phone] [place], [place], [place] [address]   [facility] [phone]  [email or web address] [address], [place], [place], [place] [address]   Merrianne M. Clarene Reamer, therapist [phone] [address] [place], [place] [address]   [facility] [phone] [address], [place], [place] [address]   Vic Ripper, therapist [phone] [address], [place], [place] [address]   [facility] [address] [place], [place], [place] [address]

## 2018-01-23 ENCOUNTER — Telehealth: Payer: Self-pay | Admitting: Medical

## 2018-01-23 NOTE — Telephone Encounter (Signed)
Call wife as a follow up from recent visit.

## 2018-01-23 NOTE — Telephone Encounter (Signed)
Please call and speak to wife [name].  After speaking with [place] this past week, I recommend they make an appointment with a therapist/counselor to discussed better ways to improve communication, and how they interact with each other.   I told this to Arthus as well.  I also was impressed that per the home health nurse, he is getting some exercise and more capable of exercise than it seemed at his previous visit.    Let her know I am out of town all next week, but she can call me the following Monday to discuss this further, but we all can do better with communication.  I really think they could benefit with some joint therapy.  [facility] [address], [place], [place] [address] [phone]    [facility] [phone]  [email or web address] [address] [place], [place], [place] [address]  Rema Fendt, therapist  Or Toy Cookey, MA, clinical psychologist    Verneda Skill. Clarene Reamer, therapist [phone] [address] [place], [place] [address]   [facility] [phone] [address], [place], [place] [address]   Vic Ripper, therapist [phone] [address], [place], [place] [address]

## 2018-01-26 NOTE — Telephone Encounter (Signed)
Called and spoke with pt 's  Wife on  Friday 01/23/2018 and mail out letter with list of counselor.

## 2018-02-05 ENCOUNTER — Ambulatory Visit (INDEPENDENT_AMBULATORY_CARE_PROVIDER_SITE_OTHER): Payer: Medicare Other | Admitting: Neurology

## 2018-02-05 ENCOUNTER — Encounter: Payer: Self-pay | Admitting: Neurology

## 2018-02-05 VITALS — BP 137/52 | HR 88 | Wt 220.8 lb

## 2018-02-05 DIAGNOSIS — I639 Cerebral infarction, unspecified: Secondary | ICD-10-CM | POA: Diagnosis not present

## 2018-02-05 DIAGNOSIS — F0390 Unspecified dementia without behavioral disturbance: Secondary | ICD-10-CM

## 2018-02-05 MED ORDER — MEMANTINE HCL ER 28 MG PO CP24: 28.0000 mg | ORAL_CAPSULE | Freq: Every day | ORAL | 3 refills | Status: DC
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
## 2018-02-05 NOTE — Patient Instructions (Signed)
I had a long discussion with the patient and his wife regarding his recent cognitive and memory worsening the last 2 months. Recent brain imaging does not show any new stroke. I recommend increase dose of Namenda to 28 mg  XR daily and continue Aricept 10 mg daily. Have discussed possible side effects with the patient and wife and asked them to call me if needed. Continue Xarelto given history of pulmonary embolism. Maintain strict control of hypertension with blood pressure goal below 130/90 and diabetes with him about an A1c goal below 6.5%. Return for follow-up with Cecille Rubin nurse practitioner in 3 months or call earlier if necessary

## 2018-02-17 ENCOUNTER — Other Ambulatory Visit: Payer: Self-pay | Admitting: Medical

## 2018-02-17 NOTE — Telephone Encounter (Signed)
Please advise, does not look like you have prescribed that before.

## 2018-02-23 ENCOUNTER — Other Ambulatory Visit: Payer: Self-pay | Admitting: Medical

## 2018-03-05 ENCOUNTER — Other Ambulatory Visit: Payer: Self-pay | Admitting: Medical

## 2018-03-05 DIAGNOSIS — D509 Iron deficiency anemia, unspecified: Secondary | ICD-10-CM

## 2018-03-05 NOTE — Telephone Encounter (Signed)
Okay to refill? 

## 2018-03-06 ENCOUNTER — Other Ambulatory Visit: Payer: Self-pay | Admitting: Medical

## 2018-03-06 NOTE — Telephone Encounter (Signed)
Okay to refill?  You did not prescribe. Thanks!

## 2018-03-16 ENCOUNTER — Other Ambulatory Visit: Payer: Self-pay | Admitting: Medical

## 2018-03-16 NOTE — Telephone Encounter (Signed)
Is this okay to refill? 

## 2018-03-23 ENCOUNTER — Other Ambulatory Visit: Payer: Self-pay | Admitting: Medical

## 2018-03-23 NOTE — Telephone Encounter (Signed)
Is this okay to refill? 

## 2018-03-25 ENCOUNTER — Encounter: Payer: Self-pay | Admitting: Medical

## 2018-03-25 ENCOUNTER — Ambulatory Visit (INDEPENDENT_AMBULATORY_CARE_PROVIDER_SITE_OTHER): Payer: Medicare Other | Admitting: Medical

## 2018-03-25 VITALS — BP 130/64 | HR 64 | Temp 98.0°F | Ht 69.25 in | Wt 218.4 lb

## 2018-03-25 DIAGNOSIS — B356 Tinea cruris: Secondary | ICD-10-CM | POA: Diagnosis not present

## 2018-03-25 DIAGNOSIS — N481 Balanitis: Secondary | ICD-10-CM | POA: Diagnosis not present

## 2018-03-25 DIAGNOSIS — W57XXXA Bitten or stung by nonvenomous insect and other nonvenomous arthropods, initial encounter: Secondary | ICD-10-CM

## 2018-03-25 DIAGNOSIS — R21 Rash and other nonspecific skin eruption: Secondary | ICD-10-CM

## 2018-03-25 DIAGNOSIS — I639 Cerebral infarction, unspecified: Secondary | ICD-10-CM

## 2018-03-25 MED ORDER — MUPIROCIN 2 % EX OINT: 1.0000 "application " | TOPICAL_OINTMENT | Freq: Three times a day (TID) | CUTANEOUS | 0 refills | Status: DC

## 2018-03-25 MED ORDER — NYSTATIN 100000 UNIT/GM EX POWD: Freq: Four times a day (QID) | CUTANEOUS | 1 refills | Status: DC

## 2018-03-25 MED ORDER — NYSTATIN 100000 UNIT/GM EX CREA: 1.0000 "application " | TOPICAL_CREAM | Freq: Two times a day (BID) | CUTANEOUS | 1 refills | Status: DC

## 2018-03-25 NOTE — Progress Notes (Signed)
Subjective: Chief Complaint  Patient presents with  . Pruritis    all over    Here for itching and rash.  Here with wife  Has had a few recent tick bites, 1 on left upper thigh, 1 on left leg behind knee.   Been scratching both and they have red knots.  No drainage  Has pink itchy rash around head of penis and around scrotum.  Using benadryl gel.   Current Outpatient Medications on File Prior to Visit  Medication Sig Dispense Refill  . aspirin EC 81 MG tablet Take 1 tablet (81 mg total) by mouth daily. 30 tablet 2  . diltiazem (CARDIZEM CD) 120 MG 24 hr capsule Take 1 capsule (120 mg total) by mouth daily. 30 capsule 1  . donepezil (ARICEPT) 10 MG tablet Take 1 tablet (10 mg total) by mouth at bedtime. 30 tablet 1  . empagliflozin (JARDIANCE) 10 MG TABS tablet 1/2 tablet daily (Patient taking differently: Take 10 mg by mouth daily. ) 90 tablet 1  . flecainide (TAMBOCOR) 150 MG tablet Take 0.5 tablets (75 mg total) by mouth 2 (two) times daily. 30 tablet 1  . folic acid (FOLVITE) 1 MG tablet TAKE 1 TABLET(1 MG) BY MOUTH DAILY 90 tablet 0  . insulin degludec (TRESIBA FLEXTOUCH) 100 UNIT/ML SOPN FlexTouch Pen Inject 0.15 mLs (15 Units total) daily into the skin. 5 pen 0  . Insulin Pen Needle (BD PEN NEEDLE NANO U/F) 32G X 4 MM MISC 1 each by Does not apply route daily. DX E11.9 100 each 3  . INVOKAMET [phone] MG TABS TAKE 1 TABLET BY MOUTH TWICE DAILY 180 tablet 0  . JARDIANCE 10 MG TABS tablet TAKE 1 TABLET BY MOUTH DAILY 90 tablet 0  . memantine (NAMENDA XR) 28 MG CP24 24 hr capsule Take 1 capsule (28 mg total) by mouth daily. 30 capsule 3  . metFORMIN (GLUCOPHAGE) 850 MG tablet Take 1 tablet (850 mg total) by mouth 2 (two) times daily with a meal. 60 tablet 3  . metFORMIN (GLUCOPHAGE) 850 MG tablet TAKE 1 TABLET BY MOUTH TWICE DAILY WITH MEALS 180 tablet 0  . Misc Natural Products (OSTEO BI-FLEX ADV JOINT SHIELD PO) Take 1 tablet by mouth daily.     [name] PARoxetine (PAXIL) 10 MG tablet Take  1 tablet (10 mg total) by mouth daily. 30 tablet 2  . PARoxetine (PAXIL) 10 MG tablet TAKE 1 TABLET(10 MG) BY MOUTH DAILY 30 tablet 0  . POLY-IRON 150 150 MG capsule TAKE 1 CAPSULE(150 MG) BY MOUTH DAILY 90 capsule 0  . rivaroxaban (XARELTO) 20 MG TABS tablet TAKE 1 TABLET(20 MG) BY MOUTH DAILY WITH SUPPER 30 tablet 1  . rosuvastatin (CRESTOR) 10 MG tablet TAKE 1 TABLET BY MOUTH AT BEDTIME 90 tablet 0  . SYMBICORT 160-4.5 MCG/ACT inhaler INHALE 2 PUFFS INTO THE LUNGS TWICE DAILY 10.2 g 0  . tamsulosin (FLOMAX) 0.4 MG CAPS capsule Take 1 capsule (0.4 mg total) by mouth daily. 30 capsule 1  . thiamine 100 MG tablet Take 1 tablet (100 mg total) by mouth daily. 30 tablet 1  . TRUE METRIX BLOOD GLUCOSE TEST test strip TEST TWICE DAILY AS DIRECTED 100 each 1  . TRUE METRIX BLOOD GLUCOSE TEST test strip TEST TWICE DAILY AS DIRECTED 100 each 2  . XARELTO 20 MG TABS tablet TAKE 1 TABLET(20 MG) BY MOUTH DAILY WITH SUPPER 90 tablet 0   No current facility-administered medications on file prior to visit.    ROS  as in subjective    Objective: BP 130/64   Pulse 64   Temp 98 F (36.7 C) (Oral)   Ht 5' 9.25" (1.759 m)   Wt 218 lb 6.4 oz (99.1 kg)   SpO2 95%   BMI 32.02 kg/m   Gen: wd, wn, nad Skin: contiguous pink irritated skin rash along scrotum, bilat.   Left upper inner thigh and posterior left knee popliteal region with small 1cm raised erythematous lesion from recent tick bite. No target lesion, no warmth , no fluctuance.   Erythema along foreskin and glans penis  Assessment: Encounter Diagnoses  Name Primary?  . Tinea cruris Yes  . Tick bite, initial encounter   . Rash   . Balanitis     Plan: Tinea cruris and balanitis - begin nystatin cream and powder as discussed x 2 weeks, then prn if flares back up.   Keep areas dry and clean.   Call back if not improving within 2 weeks.   Tick bite - no sign of lymes.   Advised soap and water hygiene, begin mupirocin ointment x next week.  If  not improving within a week then call back, particular if symptoms of lyme disease as discussed.  Tandre was seen today for pruritis.  Diagnoses and all orders for this visit:  Tinea cruris  Tick bite, initial encounter  Rash  Balanitis  Other orders -     nystatin (MYCOSTATIN/NYSTOP) powder; Apply topically 4 (four) times daily. -     nystatin cream (MYCOSTATIN); Apply 1 application topically 2 (two) times daily. -     mupirocin ointment (BACTROBAN) 2 %; Apply 1 application topically 3 (three) times daily.

## 2018-04-02 ENCOUNTER — Other Ambulatory Visit: Payer: Self-pay | Admitting: Medical

## 2018-04-14 ENCOUNTER — Other Ambulatory Visit: Payer: Self-pay | Admitting: Medical

## 2018-04-16 ENCOUNTER — Other Ambulatory Visit: Payer: Self-pay | Admitting: Family Medicine

## 2018-04-16 NOTE — Telephone Encounter (Signed)
Your patient 

## 2018-04-16 NOTE — Telephone Encounter (Signed)
[facility] is requesting to fill pt paxil. Please advise [facility]

## 2018-05-04 ENCOUNTER — Other Ambulatory Visit: Payer: Self-pay | Admitting: Medical

## 2018-05-04 ENCOUNTER — Telehealth: Payer: Self-pay | Admitting: Medical

## 2018-05-04 MED ORDER — RIVAROXABAN 20 MG PO TABS: ORAL_TABLET | ORAL | 0 refills | Status: DC

## 2018-05-04 NOTE — Telephone Encounter (Signed)
Recv'd refill request from [facility] for Xarelto 20mg  for #30

## 2018-05-10 ENCOUNTER — Other Ambulatory Visit: Payer: Self-pay | Admitting: Medical

## 2018-05-11 ENCOUNTER — Telehealth: Payer: Self-pay

## 2018-05-11 ENCOUNTER — Other Ambulatory Visit: Payer: Self-pay | Admitting: Medical

## 2018-05-11 NOTE — Telephone Encounter (Signed)
Is this ok to refill?  

## 2018-05-11 NOTE — Telephone Encounter (Signed)
Pt wife called, stating pt does not have enough [place] to last until 05/25/18. Although refill was sent, pharmacy will not dispense because insurance will not cover until 05/25/18. Please advise patient.

## 2018-05-11 NOTE — Telephone Encounter (Signed)
pls inquire.  Not sure what the issue is?  Is the quantity per month not sufficient?  How much is being dispensed?

## 2018-05-12 NOTE — Telephone Encounter (Signed)
Spoke to the pharmacy and they stated they had been dispensing 0.38ml because that dosage had been on auto refill. They canceled the 0.10lm auto refill on yesterday when the pharmacist caught that they were given the wrong dosage. However it appears that we had been sending 0.15 refill to the pharmacy. Unsure where there mix up really came in at. Please advise further.

## 2018-05-12 NOTE — Telephone Encounter (Signed)
Just let pharmacy know to use "quantity sufficient" based on his current units daily, 18u daily QHS  Also , get him in for diabetes fasting f/u

## 2018-05-12 NOTE — Telephone Encounter (Signed)
Pharmacy has been informed of providers note and stated they will take care of it from this point as well as call the patient.
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
## 2018-05-20 ENCOUNTER — Encounter: Payer: Self-pay | Admitting: Nurse Practitioner

## 2018-05-20 ENCOUNTER — Other Ambulatory Visit: Payer: Self-pay | Admitting: Medical

## 2018-05-20 ENCOUNTER — Ambulatory Visit (INDEPENDENT_AMBULATORY_CARE_PROVIDER_SITE_OTHER): Payer: Medicare Other | Admitting: Nurse Practitioner

## 2018-05-20 ENCOUNTER — Encounter: Payer: Self-pay | Admitting: Psychology

## 2018-05-20 VITALS — BP 136/69 | HR 70 | Ht 71.0 in | Wt 220.0 lb

## 2018-05-20 DIAGNOSIS — G3184 Mild cognitive impairment, so stated: Secondary | ICD-10-CM

## 2018-05-20 DIAGNOSIS — Z9181 History of falling: Secondary | ICD-10-CM | POA: Diagnosis not present

## 2018-05-20 DIAGNOSIS — F0390 Unspecified dementia without behavioral disturbance: Secondary | ICD-10-CM | POA: Diagnosis not present

## 2018-05-20 DIAGNOSIS — I639 Cerebral infarction, unspecified: Secondary | ICD-10-CM | POA: Diagnosis not present

## 2018-05-20 DIAGNOSIS — I10 Essential (primary) hypertension: Secondary | ICD-10-CM | POA: Diagnosis not present

## 2018-05-20 DIAGNOSIS — R413 Other amnesia: Secondary | ICD-10-CM | POA: Diagnosis not present

## 2018-05-20 MED ORDER — DONEPEZIL HCL 10 MG PO TABS: 10.0000 mg | ORAL_TABLET | Freq: Every day | ORAL | 6 refills | Status: DC

## 2018-05-20 MED ORDER — MEMANTINE HCL ER 28 MG PO CP24: 28.0000 mg | ORAL_CAPSULE | Freq: Every day | ORAL | 6 refills | Status: DC

## 2018-05-20 NOTE — Telephone Encounter (Signed)
Is this ok to refill?  

## 2018-05-20 NOTE — Patient Instructions (Signed)
Neuropysch evaluation ordered Continue Aricept at current dose  Namenda 10mg   1 twice daily  Recommend psych consult Be careful with ambulation at risk for falls F/U in 6 months

## 2018-05-21 NOTE — Telephone Encounter (Signed)
Have pharmacy resend this to neurology

## 2018-05-21 NOTE — Progress Notes (Signed)
Personally  participated in, made any corrections needed, and agree with history, physical, neuro exam,assessment and plan as stated above.    Alcide Memoli, MD [facility] 

## 2018-06-11 ENCOUNTER — Other Ambulatory Visit: Payer: Self-pay | Admitting: Medical

## 2018-06-11 DIAGNOSIS — D509 Iron deficiency anemia, unspecified: Secondary | ICD-10-CM

## 2018-06-11 NOTE — Telephone Encounter (Signed)
Is this ok to refill?  

## 2018-06-11 NOTE — Telephone Encounter (Signed)
Send 30 day supply on each and get in for med check

## 2018-06-19 ENCOUNTER — Encounter: Payer: Self-pay | Admitting: Medical

## 2018-06-20 ENCOUNTER — Other Ambulatory Visit: Payer: Self-pay | Admitting: Internal Medicine

## 2018-06-20 ENCOUNTER — Other Ambulatory Visit: Payer: Self-pay | Admitting: Medical

## 2018-06-20 DIAGNOSIS — I493 Ventricular premature depolarization: Secondary | ICD-10-CM

## 2018-07-07 ENCOUNTER — Telehealth: Payer: Self-pay

## 2018-07-07 NOTE — Telephone Encounter (Signed)
lmom asking patient to call and reschedule his appt for medication management per PCP.

## 2018-07-09 ENCOUNTER — Ambulatory Visit (INDEPENDENT_AMBULATORY_CARE_PROVIDER_SITE_OTHER): Payer: Medicare Other | Admitting: Medical

## 2018-07-09 ENCOUNTER — Encounter: Payer: Self-pay | Admitting: Medical

## 2018-07-09 VITALS — BP 120/66 | HR 59 | Temp 98.2°F | Ht 69.25 in | Wt 222.0 lb

## 2018-07-09 DIAGNOSIS — Z23 Encounter for immunization: Secondary | ICD-10-CM | POA: Diagnosis not present

## 2018-07-09 DIAGNOSIS — I10 Essential (primary) hypertension: Secondary | ICD-10-CM

## 2018-07-09 DIAGNOSIS — E118 Type 2 diabetes mellitus with unspecified complications: Secondary | ICD-10-CM

## 2018-07-09 DIAGNOSIS — F339 Major depressive disorder, recurrent, unspecified: Secondary | ICD-10-CM

## 2018-07-09 DIAGNOSIS — F0390 Unspecified dementia without behavioral disturbance: Secondary | ICD-10-CM | POA: Diagnosis not present

## 2018-07-09 MED ORDER — TAMSULOSIN HCL 0.4 MG PO CAPS: 0.4000 mg | ORAL_CAPSULE | Freq: Every day | ORAL | 1 refills | Status: DC

## 2018-07-09 MED ORDER — FOLIC ACID 1 MG PO TABS: ORAL_TABLET | ORAL | 3 refills | Status: DC

## 2018-07-09 MED ORDER — THIAMINE HCL 100 MG PO TABS: 100.0000 mg | ORAL_TABLET | Freq: Every day | ORAL | 3 refills | Status: DC

## 2018-07-09 MED ORDER — EMPAGLIFLOZIN 10 MG PO TABS: 10.0000 mg | ORAL_TABLET | Freq: Every day | ORAL | 1 refills | Status: DC

## 2018-07-09 MED ORDER — RIVAROXABAN 20 MG PO TABS: ORAL_TABLET | ORAL | 1 refills | Status: DC

## 2018-07-09 MED ORDER — ROSUVASTATIN CALCIUM 10 MG PO TABS: 10.0000 mg | ORAL_TABLET | Freq: Every day | ORAL | 1 refills | Status: DC

## 2018-07-09 MED ORDER — PAROXETINE HCL 10 MG PO TABS: ORAL_TABLET | ORAL | 5 refills | Status: DC

## 2018-07-09 MED ORDER — METFORMIN HCL 850 MG PO TABS: 850.0000 mg | ORAL_TABLET | Freq: Two times a day (BID) | ORAL | 1 refills | Status: DC

## 2018-07-09 NOTE — Patient Instructions (Signed)
recommendations  Talk with your pharmacy to coordinate refills so you don't get overwhelmed with auto refills  Make sure you are NOT taking Invokamet  Your diabetes medications include Metformin, Jardiance, and Tresiba nightly injection  Begin using daily Lubriderm or Aquaphor moisturizing lotion  Consider seeing a counselor or psychiatrist regarding depressed moor or not feeling well emotionally  We updated your yearly flu shot today    RESOURCES in [place], [place]  If you are experiencing a mental health crisis or an emergency, please call 911 or go to the nearest emergency department.  [facility]   [phone] [facility]  [phone] [facility]   [phone]  Suicide Hotline 1-800-Suicide 3206373673)  National Suicide Prevention Lifeline 417 201 7785  [phone])  Domestic Violence, Rape/Crisis - [place] [phone]      Psychiatry and Counseling services  [facility] [place], [place], [place] [address] [phone]  Lina Sayre, therapist Dr. Lynder Parents, psychiatrist Dr. Milana Huntsman, child psychiatrist   Dr. Launa Flight [address], [place], [place] [address] [phone]   Dr. Chucky May, psychiatry [address] [place]ne [place], [place] [address] [phone]   [place], [place], [place] [address] [phone]   [facility] [place], [place], [place] [address] [phone]    Counseling Services (NON- psychiatrist offices)  [facility] [address], [place], [place] [address] [phone]   [facility] [phone] [place], [place], [place] [address]   [facility] [phone]  [email or web address] [address], [place], [place], [place] [address]   Merrianne M. Clarene Reamer,  therapist [phone] [address] [place], [place] [address]   [facility] [phone] [address] [place], [place] [address]

## 2018-07-09 NOTE — Progress Notes (Signed)
Subjective: Chief Complaint  Patient presents with  . Medication Management   Here for several issues.  I recently got a notice from his insurance about possible interaction between flecainide and fluoxetine.  He has been on both of these medicines for several months possibly over a year without any complaint of side effects.  He is also taking low-dose and tolerating it just fine.  He notes that he has times when he is down but he feels happy most of the time he states.  He jokingly says that is always he lets his wife talk he is fine.  He notes that he does not get up to about 11 AM, likes to go outside and sit in his rocking chair with his dog on the porch because his dog does not talk back.  He recently saw neurology and I asked him to come in due to some concerns about suicidal ideation.Christian [name]  He denies suicidal ideation or homicidal ideation.  He notes that he feels about the same as he always does.  No plan no new stressors in his life  He reports that he is needs refills on medications  He reports that neurology did refer him to neuropsychiatry evaluation but that appointment is not till November  Diabetes- compliant with Vania Rea, Tresiba 18 units nightly, metformin  No other new complaints  Past Medical History:  Diagnosis Date  . Agitation   . Arthritis   . Cataract   . Chronic back pain   . Confusion   . Diabetes ([place])    type 2  . DVT (deep venous thrombosis) ([place]) 01/2015  . Hearing impaired   . High cholesterol   . Hypertension   . Insomnia   . Insomnia   . Leg pain, diffuse   . Leg swelling   . Memory loss   . Mild cognitive impairment    sees [facility]  . Numbness    fingers, feet, toes  . OSA on CPAP   . 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 06/14/2015  . Pulmonary embolism ([place]) 01/2015  . Spinal stenosis    lumbar  . Tremor    on propranolol  . Wears glasses    Current Outpatient Medications on  File Prior to Visit  Medication Sig Dispense Refill  . diltiazem (CARDIZEM CD) 120 MG 24 hr capsule Take 1 capsule (120 mg total) by mouth daily. 30 capsule 1  . donepezil (ARICEPT) 10 MG tablet Take 1 tablet (10 mg total) by mouth at bedtime. 30 tablet 6  . flecainide (TAMBOCOR) 150 MG tablet Take 0.5 tablets (75 mg total) by mouth 2 (two) times daily. Please call [phone] to schedule appointment for additional refills thanks. 60 tablet 0  . Insulin Pen Needle (BD PEN NEEDLE NANO U/F) 32G X 4 MM MISC 1 each by Does not apply route daily. DX E11.9 100 each 3  . memantine (NAMENDA XR) 28 MG CP24 24 hr capsule Take 1 capsule (28 mg total) by mouth daily. 30 capsule 6  . Misc Natural Products (OSTEO BI-FLEX ADV JOINT SHIELD PO) Take 1 tablet by mouth daily.     . mupirocin ointment (BACTROBAN) 2 % Apply 1 application topically 3 (three) times daily. 22 g 0  . nystatin (MYCOSTATIN/NYSTOP) powder Apply topically 4 (four) times daily. 45 g 1  . nystatin cream (MYCOSTATIN) Apply 1 application topically 2 (two) times daily. 30 g 1  . POLY-IRON 150 150 MG capsule TAKE 1  CAPSULE(150 MG) BY MOUTH DAILY 30 capsule 0  . TRESIBA FLEXTOUCH 100 UNIT/ML SOPN FlexTouch Pen INJECT 15 UNITS SUBCUTANEOUS DAILY AT 10 PM 6 pen 0  . TRUE METRIX BLOOD GLUCOSE TEST test strip TEST TWICE DAILY AS DIRECTED 100 each 1  . TRUE METRIX BLOOD GLUCOSE TEST test strip TEST TWICE DAILY AS DIRECTED 100 each 2   No current facility-administered medications on file prior to visit.    ROS as in subjective    Objective: BP 120/66   Pulse (!) 59   Temp 98.2 F (36.8 C) (Oral)   Ht 5' 9.25" (1.759 m)   Wt 222 lb (100.7 kg)   SpO2 94%   BMI 32.55 kg/m   General appearence: alert, no distress, WD/WN, has a new beard  Neck: supple, no lymphadenopathy, no thyromegaly, no masses Heart: RRR, normal S1, S2, no murmurs Lungs: CTA bilaterally, no wheezes, rhonchi, or rales Pulses: 2+ symmetric, upper and lower extremities,  normal cap refill Psych: pleasant, good eye contact, answers questions appropriately    Assessment: Encounter Diagnoses  Name Primary?  . Type 2 diabetes mellitus with complication, without long-term current use of insulin ([place]) Yes  . Essential hypertension   . Need for immunization against influenza   . Dementia without behavioral disturbance, unspecified dementia type   . Depression, recurrent ([place])     Plan: Diabetes-labs today, continue current medications  Hypertension-continue current medications  Counseled on the influenza virus vaccine.  Vaccine information sheet given.  Influenza vaccine given after consent obtained.  Dementia- reviewed the recent neurology consult notes regarding his dementia, as well as the concern about his mood and need to see psychiatry  Depression- we discussed the potential interaction between flecainide and fluoxetine but he has tolerated this just fine at low-dose.  Continue current medication.  Again encouraged him to see a counselor and establish with psychiatry  Elijah was seen today for medication management.  Diagnoses and all orders for this visit:  Type 2 diabetes mellitus with complication, without long-term current use of insulin (HCC) -     Comprehensive metabolic panel -     Hemoglobin A1c  Essential hypertension -     Comprehensive metabolic panel  Need for immunization against influenza -     Flu Vaccine QUAD 6+ mos PF IM (Fluarix Quad PF)  Dementia without behavioral disturbance, unspecified dementia type  Depression, recurrent ([place])  Other orders -     metFORMIN (GLUCOPHAGE) 850 MG tablet; Take 1 tablet (850 mg total) by mouth 2 (two) times daily with a meal. -     empagliflozin (JARDIANCE) 10 MG TABS tablet; Take 10 mg by mouth daily. -     folic acid (FOLVITE) 1 MG tablet; TAKE 1 TABLET(1 MG) BY MOUTH DAILY -     thiamine 100 MG tablet; Take 1 tablet (100 mg total) by mouth daily. -     tamsulosin (FLOMAX) 0.4 MG  CAPS capsule; Take 1 capsule (0.4 mg total) by mouth daily. -     rosuvastatin (CRESTOR) 10 MG tablet; Take 1 tablet (10 mg total) by mouth at bedtime. -     PARoxetine (PAXIL) 10 MG tablet; TAKE 1 TABLET(10 MG) BY MOUTH DAILY -     rivaroxaban (XARELTO) 20 MG TABS tablet; TAKE 1 TABLET(20 MG) BY MOUTH DAILY WITH SUPPER

## 2018-07-10 ENCOUNTER — Other Ambulatory Visit: Payer: Self-pay | Admitting: Medical

## 2018-07-10 LAB — COMPREHENSIVE METABOLIC PANEL
ALT: 22 IU/L (ref 0–44)
AST: 23 IU/L (ref 0–40)
Albumin/Globulin Ratio: 1.8 (ref 1.2–2.2)
Albumin: 4.3 g/dL (ref 3.5–4.8)
Alkaline Phosphatase: 87 IU/L (ref 39–117)
BUN/Creatinine Ratio: 16 (ref 10–24)
BUN: 13 mg/dL (ref 8–27)
Bilirubin Total: 0.3 mg/dL (ref 0.0–1.2)
CALCIUM: 9.5 mg/dL (ref 8.6–10.2)
CO2: 20 mmol/L (ref 20–29)
CREATININE: 0.83 mg/dL (ref 0.76–1.27)
Chloride: 104 mmol/L (ref 96–106)
GFR calc Af Amer: 100 mL/min/{1.73_m2} (ref >59)
GFR, EST NON AFRICAN AMERICAN: 87 mL/min/{1.73_m2} (ref >59)
GLOBULIN, TOTAL: 2.4 g/dL (ref 1.5–4.5)
Glucose: 201 mg/dL — ABNORMAL HIGH (ref 65–99)
Potassium: 5.1 mmol/L (ref 3.5–5.2)
SODIUM: 140 mmol/L (ref 134–144)
TOTAL PROTEIN: 6.7 g/dL (ref 6.0–8.5)

## 2018-07-10 LAB — HEMOGLOBIN A1C
Est. average glucose Bld gHb Est-mCnc: 206 mg/dL
Hgb A1c MFr Bld: 8.8 % — ABNORMAL HIGH (ref 4.8–5.6)

## 2018-07-10 MED ORDER — INSULIN PEN NEEDLE 32G X 4 MM MISC: 1.0000 | Freq: Every day | 11 refills | Status: DC

## 2018-07-10 MED ORDER — INSULIN DEGLUDEC 100 UNIT/ML ~~LOC~~ SOPN: PEN_INJECTOR | SUBCUTANEOUS | 0 refills | Status: DC

## 2018-07-17 ENCOUNTER — Other Ambulatory Visit: Payer: Self-pay | Admitting: Internal Medicine

## 2018-08-05 ENCOUNTER — Other Ambulatory Visit: Payer: Self-pay | Admitting: Medical

## 2018-08-05 NOTE — Telephone Encounter (Signed)
Is this ok to refill?  

## 2018-08-06 ENCOUNTER — Other Ambulatory Visit: Payer: Self-pay | Admitting: Internal Medicine

## 2018-08-06 MED ORDER — DILTIAZEM HCL 30 MG PO TABS: 30.0000 mg | ORAL_TABLET | Freq: Two times a day (BID) | ORAL | 6 refills | Status: DC

## 2018-08-06 NOTE — Telephone Encounter (Signed)
Needing refill on his diltiazem (CARDIZEM) 30 MG tablet [[id number]]  Sent to [facility] in [place]   Scheduled apt w/ Dr. Lovena Le at [facility]. St and one here w/ Dr. Harl Bowie both in Nov.

## 2018-08-06 NOTE — Telephone Encounter (Signed)
Refilled cardizem as requested

## 2018-08-19 ENCOUNTER — Other Ambulatory Visit: Payer: Self-pay | Admitting: Medical

## 2018-09-14 ENCOUNTER — Encounter: Payer: Self-pay | Admitting: Psychology

## 2018-09-25 ENCOUNTER — Other Ambulatory Visit: Payer: Self-pay | Admitting: Medical

## 2018-09-25 ENCOUNTER — Telehealth: Payer: Self-pay | Admitting: Medical

## 2018-09-25 MED ORDER — ALPRAZOLAM 0.25 MG PO TABS: 0.2500 mg | ORAL_TABLET | Freq: Three times a day (TID) | ORAL | 0 refills | Status: DC | PRN

## 2018-09-25 NOTE — Telephone Encounter (Signed)
Called and advised pt's wife that med has been sent to pharmacy and gave [employer] instruction and message

## 2018-09-25 NOTE — Telephone Encounter (Signed)
  Pt's wife called. Noah's brother passed away unexpectedly and she wants to see if you can call in rx for his nerves She states he is not doing well and needs something  Please call and advised when/if rx called in

## 2018-09-25 NOTE — Telephone Encounter (Signed)
Please call and expresse sympathy for his loss.  I called out Xanax he can use short-term as needed to calm his nerves up to twice daily.

## 2018-09-29 ENCOUNTER — Ambulatory Visit: Payer: Medicare Other | Admitting: Internal Medicine

## 2018-09-30 ENCOUNTER — Other Ambulatory Visit: Payer: Self-pay

## 2018-09-30 DIAGNOSIS — R32 Unspecified urinary incontinence: Secondary | ICD-10-CM

## 2018-10-01 ENCOUNTER — Ambulatory Visit: Payer: Medicare Other | Admitting: Psychology

## 2018-10-01 ENCOUNTER — Ambulatory Visit (INDEPENDENT_AMBULATORY_CARE_PROVIDER_SITE_OTHER): Payer: Medicare Other | Admitting: Psychology

## 2018-10-01 ENCOUNTER — Encounter: Payer: Self-pay | Admitting: Psychology

## 2018-10-01 ENCOUNTER — Encounter

## 2018-10-01 DIAGNOSIS — R413 Other amnesia: Secondary | ICD-10-CM | POA: Diagnosis not present

## 2018-10-01 DIAGNOSIS — F332 Major depressive disorder, recurrent severe without psychotic features: Secondary | ICD-10-CM

## 2018-10-01 DIAGNOSIS — F0390 Unspecified dementia without behavioral disturbance: Secondary | ICD-10-CM
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
## 2018-10-01 NOTE — Progress Notes (Signed)
   Neuropsychology Note  Christian [name] completed 60 minutes of neuropsychological testing with technician, Christian [name], BS, under the supervision of Christian [name], Licensed Psychologist. The patient did not appear overtly distressed by the testing session, per behavioral observation or via self-report to the technician. Rest breaks were offered.   Clinical Decision Making: In considering the patient's current level of functioning, level of presumed impairment, nature of symptoms, emotional and behavioral responses during the interview, level of literacy, and observed level of motivation/effort, a battery of tests was selected and communicated to the psychometrician.  Communication between the psychologist and technician was ongoing throughout the testing session and changes were made as deemed necessary based on patient performance on testing, technician observations and additional pertinent factors such as those listed above.  Christian [name] will return within approximately 2 weeks for an interactive feedback session with Christian [name] at which time his test performances, clinical impressions and treatment recommendations will be reviewed in detail. The patient understands he can contact our office should he require our assistance before this time.  35 minutes spent performing neuropsychological evaluation services/clinical decision making (psychologist). [CPT 16553] 60 minutes spent face-to-face with patient administering standardized tests, 30 minutes spent scoring (technician). [CPT [id number], 74827]  Full report to follow.

## 2018-10-02 ENCOUNTER — Other Ambulatory Visit: Payer: Self-pay | Admitting: Medical

## 2018-10-02 NOTE — Telephone Encounter (Signed)
Is this ok to refill?  

## 2018-10-09 ENCOUNTER — Encounter: Payer: Self-pay | Admitting: Cardiology

## 2018-10-09 ENCOUNTER — Ambulatory Visit (INDEPENDENT_AMBULATORY_CARE_PROVIDER_SITE_OTHER): Payer: Medicare Other | Admitting: Cardiology

## 2018-10-09 VITALS — BP 136/76 | HR 73 | Ht 70.5 in | Wt 225.0 lb

## 2018-10-09 DIAGNOSIS — I639 Cerebral infarction, unspecified: Secondary | ICD-10-CM

## 2018-10-09 DIAGNOSIS — E782 Mixed hyperlipidemia: Secondary | ICD-10-CM

## 2018-10-09 DIAGNOSIS — R600 Localized edema: Secondary | ICD-10-CM

## 2018-10-09 DIAGNOSIS — I493 Ventricular premature depolarization: Secondary | ICD-10-CM | POA: Diagnosis not present

## 2018-10-09 MED ORDER — DILTIAZEM HCL 30 MG PO TABS: 30.0000 mg | ORAL_TABLET | Freq: Two times a day (BID) | ORAL | 3 refills | Status: DC

## 2018-10-09 MED ORDER — FUROSEMIDE 40 MG PO TABS: ORAL_TABLET | ORAL | 3 refills | Status: DC

## 2018-10-09 NOTE — Progress Notes (Signed)
Clinical Summary Christian [name] is a 77 y.o.male seen today for follow up of the following medical problems.   1. PVCs - history of frequent ventricular ectopy. Workup for underlying structural heart disease has been negative, including echo and Lexiscan - he has had generalized fatigue but no palpitations, thought perhaps related to frequent venricular ectopy. Seen by EP and started on flecanide by Christian [name] - repeat 24 hr holter 10/2015 with 3000 PVCs, this is down from approx 10,000 from holter in 08/2015. He was started on flecanide by Christian [name] and is currently on 75mg  bid.    - no recent palpitations    2. PE - 01/2015 CT ches twith large volume PE with evidence of RV strain.  - has been on anticoag since that time. Echo 05/2015 shows resolution of RV strain - anticoag has been managed by his heme/onc physician    3. Hyperlipidemia  Jan 2019 TC 132 TG 69 HDL 38 LDL 80 Compliant with statin Past Medical History:  Diagnosis Date  . Agitation   . Arthritis   . Cataract   . Chronic back pain   . Confusion   . Diabetes ([place])    type 2  . DVT (deep venous thrombosis) ([place]) 01/2015  . Hearing impaired   . High cholesterol   . Hypertension   . Insomnia   . Insomnia   . Leg pain, diffuse   . Leg swelling   . Memory loss   . Mild cognitive impairment    sees [facility]  . Numbness    fingers, feet, toes  . OSA on CPAP   . 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 06/14/2015  . Pulmonary embolism ([place]) 01/2015  . Spinal stenosis    lumbar  . Tremor    on propranolol  . Wears glasses      Allergies  Allergen Reactions  . Bee Venom Swelling  . Ultram [Tramadol Hcl]     Makes him wired, gives insomnia  . Oxycodone Other (See Comments)    Mental status changes  . Oxycontin [Oxycodone Hcl] Other (See Comments)    Mental status changes per pt     Current Outpatient Medications  Medication Sig Dispense  Refill  . ALPRAZolam (XANAX) 0.25 MG tablet Take 1 tablet (0.25 mg total) by mouth 3 (three) times daily as needed for anxiety. 15 tablet 0  . diltiazem (CARDIZEM CD) 120 MG 24 hr capsule Take 1 capsule (120 mg total) by mouth daily. 30 capsule 1  . diltiazem (CARDIZEM) 30 MG tablet Take 1 tablet (30 mg total) by mouth 2 (two) times daily. 60 tablet 6  . donepezil (ARICEPT) 10 MG tablet Take 1 tablet (10 mg total) by mouth at bedtime. 30 tablet 6  . empagliflozin (JARDIANCE) 10 MG TABS tablet Take 10 mg by mouth daily. 90 tablet 1  . flecainide (TAMBOCOR) 150 MG tablet Take 0.5 tablets (75 mg total) by mouth 2 (two) times daily. Please call [phone] to schedule appointment for additional refills thanks. 60 tablet 0  . folic acid (FOLVITE) 1 MG tablet TAKE 1 TABLET(1 MG) BY MOUTH DAILY 90 tablet 3  . folic acid (FOLVITE) 1 MG tablet TAKE 1 TABLET(1 MG) BY MOUTH DAILY 90 tablet 0  . Insulin Pen Needle (BD PEN NEEDLE NANO U/F) 32G X 4 MM MISC 1 each by Does not apply route daily. DX E11.9 100 each 11  . memantine (NAMENDA XR) 28  MG CP24 24 hr capsule Take 1 capsule (28 mg total) by mouth daily. 30 capsule 6  . metFORMIN (GLUCOPHAGE) 850 MG tablet Take 1 tablet (850 mg total) by mouth 2 (two) times daily with a meal. 180 tablet 1  . Misc Natural Products (OSTEO BI-FLEX ADV JOINT SHIELD PO) Take 1 tablet by mouth daily.     . mupirocin ointment (BACTROBAN) 2 % Apply 1 application topically 3 (three) times daily. 22 g 0  . nystatin (MYCOSTATIN/NYSTOP) powder Apply topically 4 (four) times daily. 45 g 1  . nystatin cream (MYCOSTATIN) Apply 1 application topically 2 (two) times daily. 30 g 1  . PARoxetine (PAXIL) 10 MG tablet TAKE 1 TABLET(10 MG) BY MOUTH DAILY 30 tablet 5  . POLY-IRON 150 150 MG capsule TAKE 1 CAPSULE(150 MG) BY MOUTH DAILY 30 capsule 0  . rosuvastatin (CRESTOR) 10 MG tablet Take 1 tablet (10 mg total) by mouth at bedtime. 90 tablet 1  . tamsulosin (FLOMAX) 0.4 MG CAPS capsule Take 1  capsule (0.4 mg total) by mouth daily. 90 capsule 1  . thiamine 100 MG tablet Take 1 tablet (100 mg total) by mouth daily. 90 tablet 3  . TRESIBA FLEXTOUCH 100 UNIT/ML SOPN FlexTouch Pen INJECT 20 UNITS SUBCUTANEOUSLY DAILY AT 10 PM 6 pen 2  . TRUE METRIX BLOOD GLUCOSE TEST test strip TEST TWICE DAILY AS DIRECTED 100 each 1  . TRUE METRIX BLOOD GLUCOSE TEST test strip TEST TWICE DAILY AS DIRECTED 100 each 2  . XARELTO 20 MG TABS tablet TAKE 1 TABLET BY MOUTH EVERY DAY WITH SUPPER 90 tablet 0   No current facility-administered medications for this visit.      Past Surgical History:  Procedure Laterality Date  . CATARACT EXTRACTION    . COLONOSCOPY  2014  . TONSILLECTOMY       Allergies  Allergen Reactions  . Bee Venom Swelling  . Ultram [Tramadol Hcl]     Makes him wired, gives insomnia  . Oxycodone Other (See Comments)    Mental status changes  . Oxycontin [Oxycodone Hcl] Other (See Comments)    Mental status changes per pt      Family History  Problem Relation Age of Onset  . Dementia Father   . Clotting disorder Mother   . Heart disease Brother   . Clotting disorder Brother   . Psychiatric Illness Sister   . Leukemia Sister      Social History Christian [name] reports that he quit smoking about 13 years ago. His smoking use included cigarettes. He has a 10.00 pack-year smoking history. He has never used smokeless tobacco. Christian [name] reports that he drinks about 2.0 standard drinks of alcohol per week.   Review of Systems CONSTITUTIONAL: No weight loss, fever, chills, weakness or fatigue.  HEENT: Eyes: No visual loss, blurred vision, double vision or yellow sclerae.No hearing loss, sneezing, congestion, runny nose or sore throat.  SKIN: No rash or itching.  CARDIOVASCULAR: per hpi RESPIRATORY: No shortness of breath, cough or sputum.  GASTROINTESTINAL: No anorexia, nausea, vomiting or diarrhea. No abdominal pain or blood.  GENITOURINARY: No burning on urination, no  polyuria NEUROLOGICAL: No headache, dizziness, syncope, paralysis, ataxia, numbness or tingling in the extremities. No change in bowel or bladder control.  MUSCULOSKELETAL: No muscle, back pain, joint pain or stiffness.  LYMPHATICS: No enlarged nodes. No history of splenectomy.  PSYCHIATRIC: No history of depression or anxiety.  ENDOCRINOLOGIC: No reports of sweating, cold or heat intolerance. No polyuria or  polydipsia.  [name]   Physical Examination Vitals:   10/09/18 1514  BP: 136/76  Pulse: 73  SpO2: 95%   Vitals:   10/09/18 1514  Weight: 225 lb (102.1 kg)  Height: 5' 10.5" (1.791 m)     Gen: resting comfortably, no acute distress HEENT: no scleral icterus, pupils equal round and reactive, no palptable cervical adenopathy,  CV: RRR, no m/r/g, no jvd Resp: Clear to auscultation bilaterally GI: abdomen is soft, non-tender, non-distended, normal bowel sounds, no hepatosplenomegaly MSK: extremities are warm, no edema.  Skin: warm, no rash Neuro:  no focal deficits Psych: appropriate affect   Diagnostic Studies 05/2015 echo Study Conclusions  - Left ventricle: The cavity size was normal. Wall thickness was increased in a pattern of mild LVH. Systolic function was normal. The estimated ejection fraction was in the range of 60% to 65%. Frequent ventricular ectopy prohibts evaluation of diastolic function. Wall motion was normal; there were no regional wall motion abnormalities. - Aortic valve: Mildly calcified annulus. Trileaflet; mildly thickened leaflets. Valve area (VTI): 2.84 cm^2. Valve area (Vmax): 2.63 cm^2. - Mitral valve: Mildly calcified annulus. Mildly thickened leaflets . - Left atrium: The atrium was mildly dilated. - Frequent PVCs are noted during the exam, correlate clinically. - Technicallya adequate study.  06/2015 Holter monitor  Predominant rhythm is normal sinus  Occasional PACs, no sustained arrhythmias  Occasional PVCs in singles  and coupletes, no NSVT or sustained ventricular arrhythmias  No diary submitted  No arrhythmias. Supraventricular and ventricular ectopy without significant arrhythmia.  07/2015 Lexiscan  There was no ST segment deviation noted during stress after Lexiscan injection  The study is normal. There are no defect consistent with ischemia or infarct  This is a low risk study.  Nuclear stress EF: 50%.  10/2015 Holter 24-hour Holter monitor reviewed. Sinus rhythm and sinus tachycardia noted with heart rate range from 55 bpm up to 103 bpm. There were occasional to frequent PVCs, some ventricular bigeminy and trigeminy, rare couplets. There were no sustained ventricular arrhythmias. PVC burden was approximately 3% of total beats. PACs also noted, approximately 1% of total beats.  12/14/15 Clinic EKG (performed and reviewed in clinic)    Assessment and Plan  1. PVCs - no evidence of structural heart disease. PVC burden significantly reduced on flecanide - no recent symptoms, continue current meds  2. Hyperlipidemia - continue statin  3. [name] edema - will start lasix 40mg  prg. Prior echo was benign. Probable venous insufficiency   Arnoldo Lenis, M.D.

## 2018-10-09 NOTE — Patient Instructions (Addendum)
Medication Instructions:  Take lasix 40 mg AS NEEDED FOR SWELLING   Labwork: NONE  Testing/Procedures: NONE  Follow-Up: Your physician wants you to follow-up in: 1 year. You will receive a reminder letter in the mail two months in advance. If you don't receive a letter, please call our office to schedule the follow-up appointment.   Any Other Special Instructions Will Be Listed Below (If Applicable).     If you need a refill on your cardiac medications before your next appointment, please call your pharmacy.

## 2018-10-11 ENCOUNTER — Encounter: Payer: Self-pay | Admitting: Cardiology
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
## 2018-10-15 ENCOUNTER — Encounter: Payer: Self-pay | Admitting: Psychology

## 2018-10-15 ENCOUNTER — Telehealth: Payer: Self-pay | Admitting: Psychology

## 2018-10-15 ENCOUNTER — Ambulatory Visit (INDEPENDENT_AMBULATORY_CARE_PROVIDER_SITE_OTHER): Payer: Medicare Other | Admitting: Psychology

## 2018-10-15 DIAGNOSIS — R413 Other amnesia: Secondary | ICD-10-CM

## 2018-10-15 DIAGNOSIS — F332 Major depressive disorder, recurrent severe without psychotic features: Secondary | ICD-10-CM

## 2018-10-15 NOTE — Telephone Encounter (Signed)
Yes Ma'am. °

## 2018-10-15 NOTE — Telephone Encounter (Signed)
Hi Brandy, Thanks so much for letting me know. I was actually able to call the patient's wife and go over the results on the phone just now. Can you go ahead and check them into their appt?  Thanks! MB

## 2018-10-15 NOTE — Telephone Encounter (Signed)
Hi Dr. Si Raider  This patient's wife called and is needing to know if you can call them today at 3:00 PM for the results? She has another appointment scheduled at 1:00 PM today. Her number is [phone]. Please let me know and I can call and let her know. Thanks

## 2018-10-19 ENCOUNTER — Telehealth: Payer: Self-pay

## 2018-10-19 NOTE — Telephone Encounter (Signed)
Holly at [facility] called and left a voicemail that the office notes do not say anything about urine incontinence and nothing in labs, they need that for provider review to schedule appointment.  Please advise [facility] at [facility] phone [phone] ext [phone],  Fax [phone]

## 2018-10-21 NOTE — Telephone Encounter (Signed)
Left message on voicemail for patient to call and schedule an appointment for med check.

## 2018-10-21 NOTE — Telephone Encounter (Signed)
I did not realize that I last saw him in August.  So we need to actually just get him in for follow-up med check appointment he is due for diabetes hemoglobin A1c recheck anyhow.  I will document the incontinence concern at that time.  I cannot go back and alter a note from 3 months ago

## 2018-10-26 ENCOUNTER — Telehealth: Payer: Self-pay | Admitting: *Deleted

## 2018-10-26 NOTE — Telephone Encounter (Signed)
F/u with psychiatry for depression prior to seeing [place] for memory per CM/NP. LMVM for pts wife to call us back re: to appt.

## 2018-10-27 ENCOUNTER — Encounter: Payer: Medicare Other | Admitting: Psychology

## 2018-10-27 NOTE — Telephone Encounter (Signed)
I spoke to wife and relayed that per Dr. Halina Andreas notes and review by CM/NP Recommended seeing mental health, psychiatry/ psychology for depression, then once stable to f/u with [place].  I relayed this to wife multiple times.  I told her that we can place referral for psychiatry and they would call.  Pt lives in [place] but is ok to see someone in [place]. [employer] M[employer] but also [employer] M[employer].

## 2018-10-27 NOTE — Telephone Encounter (Signed)
LMVM for pts wife to return call re: to CM/NP recommendation to see psychiatry for depression and once stable then to come back and see [place].

## 2018-10-28 ENCOUNTER — Encounter: Payer: Self-pay | Admitting: Medical

## 2018-10-28 ENCOUNTER — Ambulatory Visit (INDEPENDENT_AMBULATORY_CARE_PROVIDER_SITE_OTHER): Payer: Medicare Other | Admitting: Medical

## 2018-10-28 VITALS — BP 130/80 | HR 66 | Temp 98.3°F | Resp 16 | Ht 70.0 in | Wt 224.8 lb

## 2018-10-28 DIAGNOSIS — R358 Other polyuria: Secondary | ICD-10-CM

## 2018-10-28 DIAGNOSIS — B356 Tinea cruris: Secondary | ICD-10-CM

## 2018-10-28 DIAGNOSIS — E1169 Type 2 diabetes mellitus with other specified complication: Secondary | ICD-10-CM

## 2018-10-28 DIAGNOSIS — R32 Unspecified urinary incontinence: Secondary | ICD-10-CM | POA: Insufficient documentation

## 2018-10-28 DIAGNOSIS — H6123 Impacted cerumen, bilateral: Secondary | ICD-10-CM | POA: Diagnosis not present

## 2018-10-28 DIAGNOSIS — Z794 Long term (current) use of insulin: Secondary | ICD-10-CM | POA: Diagnosis not present

## 2018-10-28 DIAGNOSIS — L309 Dermatitis, unspecified: Secondary | ICD-10-CM

## 2018-10-28 DIAGNOSIS — R3589 Other polyuria: Secondary | ICD-10-CM | POA: Insufficient documentation

## 2018-10-28 LAB — POCT URINALYSIS DIP (PROADVANTAGE DEVICE)
Bilirubin, UA: NEGATIVE
Blood, UA: NEGATIVE
Ketones, POC UA: NEGATIVE mg/dL
LEUKOCYTES UA: NEGATIVE
NITRITE UA: NEGATIVE
Protein Ur, POC: NEGATIVE mg/dL
Specific Gravity, Urine: 1.015
Urobilinogen, Ur: NEGATIVE
pH, UA: 5.5 (ref 5.0–8.0)

## 2018-10-28 MED ORDER — NYSTATIN 100000 UNIT/GM EX CREA: 1.0000 "application " | TOPICAL_CREAM | Freq: Two times a day (BID) | CUTANEOUS | 1 refills | Status: DC

## 2018-10-28 MED ORDER — NYSTATIN 100000 UNIT/GM EX POWD: Freq: Four times a day (QID) | CUTANEOUS | 1 refills | Status: DC

## 2018-10-28 MED ORDER — RIVAROXABAN 20 MG PO TABS: ORAL_TABLET | ORAL | 0 refills | Status: DC

## 2018-10-28 MED ORDER — EMPAGLIFLOZIN 10 MG PO TABS: 10.0000 mg | ORAL_TABLET | Freq: Every day | ORAL | 0 refills | Status: DC

## 2018-10-28 NOTE — Telephone Encounter (Signed)
Busy signal

## 2018-10-28 NOTE — Patient Instructions (Addendum)
Recommendations: Fungal infection and rash of the genital region  Begin another round of the nystatin cream and powder as discussed for the next 2 weeks  Call back if not resolved by them  Stop Jardiance/empagliflozin medication as this may also be contributing to your symptoms  Follow-up as planned with urology visit   Regarding the itchiness behind your ears, you may use Eucerin cream over-the-counter or Vaseline topically daily   For ear wax, you can use a  1:1 mixture of rubbing alcohol and white vinegar (half white vinegar, half rubbing alcohol).  Put a few drops in each ear 2-3 times a week for a few weeks to help dissolve some of the wax.  If this doesn't seem successful, return for recheck.   Diabetes  Check your blood sugar daily in the morning fasting, write these numbers down  The goal should be less than 130 fasting  Continue metformin  Stop Jardiance/empagliflozin  Pending labs tomorrow I may end up changing your units for Tresiba long-acting insulin  Check your feet daily for sores or wounds that are not healing  See your eye doctor yearly for diabetes eye exam and routine care  I recommend you get some exercise most days a week such as 30 minutes of walking daily    Lets change strategies and try something that may work better than what you are currently doing  Breakfast You may eat 1 of the following  Omelette, which can include a small amount of cheese, and vegetables such as peppers, mushrooms, small pieces of [place] or chicken  Low sugar yogurt serving which can include some fruit such as berries  Egg whites or hard boiled egg and meat (1-2 strips of bacon, or small piece of [place] sausage or [place] bacon)   Snack 1 fruit serving such as one of the following:  medium-sized apple  medium-sized orange,  Tangerine  1/2 banana   3/4 cup of fresh berries or frozen berries  A protein source such as one of the following:  8 almonds    small handful of walnuts or other nuts  small piece of cheese,  low sugar yogurt   Lunch A protein source such as 1 of the following: . 1 serving of beans such as black beans, pinto beans, green beans, or edamame (soy beans) . 1 meat serving such as 6 oz or deck of card size serving of fish, skinless chicken, or [place], either grilled or baked preferably.   You can use some pork or beef, but limit this compared to fish, chicken or [place] Vegetable - Half of your plate should be a non-starchy vegetables!  So avoid white potatoes and corn.  Otherwise, eat a large portion of vegetables. . Avocado, cucumber, tomato, carrots, greens, lettuce, squash, okra, etc.  . Vegetables can include salad with olive oil/vinaigrette dressing   Dinner A protein source such as 1 of the following: . 1 serving of beans such as black beans, pinto beans, green beans, or edamame (soy beans) . 1 meat serving such as 6 oz or deck of card size serving of fish, skinless chicken, or [place], either grilled or baked preferably.   You can use some pork or beef, but limit this compared to fish, chicken or [place] Vegetable - Half of your plate should be a non-starchy vegetables!  So avoid white potatoes and corn.  Otherwise, eat a large portion of vegetables. . Avocado, cucumber, tomato, carrots, greens, lettuce, squash, okra, etc.  . Vegetables can include  salad with olive oil/vinaigrette dressing   Beverages: Water Unsweet tea Home made juice with a juicer without sugar added other than small bit of honey or agave nectar Water with sugar free flavor such as [place]   AVOID.... For the time being I want you to cut out the following items completely: . Soda, sweet tea, juice, beer or wine or alcohol . ALL grains and breads including rice, pasta, bread, cereal . Sweets such as cake, candy, pies, chips, cookies, chocolate

## 2018-10-28 NOTE — Progress Notes (Signed)
Subjective: Chief Complaint  Patient presents with  . med check    med check    Here for med check.  He is having some problems with urinary incontinence, some urinary leakage.  Gets up 3 times per night to urinate.   Wife says she is forcing 5 bottles of water in him daily.   Sometimes dribbling, sometimes urine hesitance.      Having some itching in genital region, around testicles and inner thigh.   Not much itching head of penis but is itchy.  Using medicated powder OTC.   "Worse when sugars are out of whack"  Itching and rash in last 2 weeks.  He has appointment to see urology Friday.   He says his nose and ears itch all the time.  Eats mainly twice daily  Breakfast: Banana, diet Dr. Malachi Bonds Or often fiber cereal, banana or other fruit, peanuts Coffee  Dinner: Eats about 3-4pm General a vegetable, 12 grain bread, casserole  A meat   Cup of Ice cream few days per week 9pm  Sometimes eats a cookie, pie, or cake  Wife says he drinks wine or Dos Equis somewhat regularly.  He states that beer does not have sugar in it  He has not been checking his blood sugars  He takes a bath every other day   Past Medical History:  Diagnosis Date  . Agitation   . Arthritis   . Cataract   . Chronic back pain   . Confusion   . Diabetes ([place])    type 2  . DVT (deep venous thrombosis) ([place]) 01/2015  . Hearing impaired   . High cholesterol   . Hypertension   . Insomnia   . Insomnia   . Leg pain, diffuse   . Leg swelling   . Memory loss   . Mild cognitive impairment    sees [facility]  . Numbness    fingers, feet, toes  . OSA on CPAP   . 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 06/14/2015  . Pulmonary embolism ([place]) 01/2015  . Spinal stenosis    lumbar  . Tremor    on propranolol  . Wears glasses    Current Outpatient Medications on File Prior to Visit  Medication Sig Dispense Refill  . ALPRAZolam (XANAX) 0.25 MG  tablet Take 1 tablet (0.25 mg total) by mouth 3 (three) times daily as needed for anxiety. 15 tablet 0  . diltiazem (CARDIZEM) 30 MG tablet Take 1 tablet (30 mg total) by mouth 2 (two) times daily. 180 tablet 3  . donepezil (ARICEPT) 10 MG tablet Take 1 tablet (10 mg total) by mouth at bedtime. 30 tablet 6  . flecainide (TAMBOCOR) 150 MG tablet Take 0.5 tablets (75 mg total) by mouth 2 (two) times daily. Please call [phone] to schedule appointment for additional refills thanks. 60 tablet 0  . folic acid (FOLVITE) 1 MG tablet TAKE 1 TABLET(1 MG) BY MOUTH DAILY 90 tablet 3  . furosemide (LASIX) 40 MG tablet Take 40 mg daily AS NEEDED  For swelling. 90 tablet 3  . Insulin Pen Needle (BD PEN NEEDLE NANO U/F) 32G X 4 MM MISC 1 each by Does not apply route daily. DX E11.9 100 each 11  . memantine (NAMENDA XR) 28 MG CP24 24 hr capsule Take 1 capsule (28 mg total) by mouth daily. 30 capsule 6  . metFORMIN (GLUCOPHAGE) 850 MG tablet Take 1 tablet (850 mg total) by mouth  2 (two) times daily with a meal. 180 tablet 1  . Misc Natural Products (OSTEO BI-FLEX ADV JOINT SHIELD PO) Take 1 tablet by mouth daily.     [name] PARoxetine (PAXIL) 10 MG tablet TAKE 1 TABLET(10 MG) BY MOUTH DAILY 30 tablet 5  . POLY-IRON 150 150 MG capsule TAKE 1 CAPSULE(150 MG) BY MOUTH DAILY 30 capsule 0  . rosuvastatin (CRESTOR) 10 MG tablet Take 1 tablet (10 mg total) by mouth at bedtime. 90 tablet 1  . tamsulosin (FLOMAX) 0.4 MG CAPS capsule Take 1 capsule (0.4 mg total) by mouth daily. 90 capsule 1  . thiamine 100 MG tablet Take 1 tablet (100 mg total) by mouth daily. 90 tablet 3  . TRESIBA FLEXTOUCH 100 UNIT/ML SOPN FlexTouch Pen INJECT 20 UNITS SUBCUTANEOUSLY DAILY AT 10 PM 6 pen 2  . TRUE METRIX BLOOD GLUCOSE TEST test strip TEST TWICE DAILY AS DIRECTED 100 each 2   No current facility-administered medications on file prior to visit.    ROS as in subjective   Objective: BP 130/80   Pulse 66   Temp 98.3 F (36.8 C) (Oral)    Resp 16   Ht 5\' 10"  (1.778 m)   Wt 224 lb 12.8 oz (102 kg)   SpO2 95%   BMI 32.26 kg/m   Gen: wd, wn, nad Psych: Pleasant, answers questions appropriately, seems happy, good eye contact GU: There is pinkish-red coloration along the scrotum, bilateral upper inner thighs and penis, there is slight swelling of the foreskin and glans with erythema on the glans of the penis There is slight redness irritation behind the right ear but no obvious wound There is moderately impacted cerumen bilaterally of your canals, otherwise ears unremarkable No edema 2+ pulses    Assessment: Encounter Diagnoses  Name Primary?  . Type 2 diabetes mellitus with other specified complication, with long-term current use of insulin ([place]) Yes  . Tinea cruris   . Urinary incontinence, unspecified type   . Polyuria   . Dermatitis   . Bilateral impacted cerumen       Plan: We discussed his concerns, exam findings, symptoms  We discussed the recommendations below   Recommendations: Fungal infection and rash of the genital region  Begin another round of the nystatin cream and powder as discussed for the next 2 weeks  Call back if not resolved by them  Stop Jardiance/empagliflozin medication as this may also be contributing to your symptoms  Follow-up as planned with urology visit   Regarding the itchiness behind your ears, you may use Eucerin cream over-the-counter or Vaseline topically daily   For ear wax, you can use a  1:1 mixture of rubbing alcohol and white vinegar (half white vinegar, half rubbing alcohol).  Put a few drops in each ear 2-3 times a week for a few weeks to help dissolve some of the wax.  If this doesn't seem successful, return for recheck.   Diabetes  Check your blood sugar daily in the morning fasting, write these numbers down  The goal should be less than 130 fasting  Continue metformin  Stop Jardiance/empagliflozin  Pending labs tomorrow I may end up changing  your units for Tresiba long-acting insulin  Check your feet daily for sores or wounds that are not healing  See your eye doctor yearly for diabetes eye exam and routine care  I recommend you get some exercise most days a week such as 30 minutes of walking daily  I recommend a low-carb heavier  protein diet for the time being as discussed to help improve your sugars and help with weight loss  Ayham was seen today for med check.  Diagnoses and all orders for this visit:  Type 2 diabetes mellitus with other specified complication, with long-term current use of insulin ([facility]) -     Hemoglobin A1c  Tinea cruris  Urinary incontinence, unspecified type -     PSA  Polyuria -     PSA  Dermatitis  Bilateral impacted cerumen  Other orders -     nystatin cream (MYCOSTATIN); Apply 1 application topically 2 (two) times daily. -     nystatin (MYCOSTATIN/NYSTOP) powder; Apply topically 4 (four) times daily. -     Discontinue: empagliflozin (JARDIANCE) 10 MG TABS tablet; Take 10 mg by mouth daily. -     rivaroxaban (XARELTO) 20 MG TABS tablet; TAKE 1 TABLET BY MOUTH EVERY DAY WITH SUPPER

## 2018-10-28 NOTE — Telephone Encounter (Signed)
You dont need a referral for psych

## 2018-10-28 NOTE — Addendum Note (Signed)
Addended by: Gwinda Maine on: 10/28/2018 03:23 PM   Modules accepted: Orders

## 2018-10-28 NOTE — Telephone Encounter (Signed)
I called pt, LMVM for wife that per CM/NP no need for referral.  They can call and make appt for depression.  Dr. Casimiro Needle, [place] [phone].  They take most insurances. Recommend calling and asking.  She is to call back if questions.

## 2018-10-29 ENCOUNTER — Encounter: Payer: Self-pay | Admitting: *Deleted

## 2018-10-29 LAB — HEMOGLOBIN A1C
Est. average glucose Bld gHb Est-mCnc: 197 mg/dL
HEMOGLOBIN A1C: 8.5 % — AB (ref 4.8–5.6)

## 2018-10-29 LAB — PSA: PROSTATE SPECIFIC AG, SERUM: 1.9 ng/mL (ref 0.0–4.0)

## 2018-10-29 NOTE — Telephone Encounter (Signed)
LMVM for wife again about below.  Will send letter as well. Done.

## 2018-10-30 ENCOUNTER — Other Ambulatory Visit: Payer: Self-pay | Admitting: Medical

## 2018-10-30 DIAGNOSIS — N401 Enlarged prostate with lower urinary tract symptoms: Secondary | ICD-10-CM | POA: Diagnosis not present

## 2018-10-30 DIAGNOSIS — R351 Nocturia: Secondary | ICD-10-CM | POA: Diagnosis not present

## 2018-10-30 DIAGNOSIS — B356 Tinea cruris: Secondary | ICD-10-CM | POA: Diagnosis not present

## 2018-10-30 DIAGNOSIS — N3941 Urge incontinence: Secondary | ICD-10-CM | POA: Diagnosis not present

## 2018-10-30 DIAGNOSIS — Z125 Encounter for screening for malignant neoplasm of prostate: Secondary | ICD-10-CM | POA: Diagnosis not present

## 2018-10-30 MED ORDER — INSULIN DEGLUDEC 100 UNIT/ML ~~LOC~~ SOPN: PEN_INJECTOR | SUBCUTANEOUS | 2 refills | Status: DC

## 2018-10-30 MED ORDER — METFORMIN HCL 850 MG PO TABS: 850.0000 mg | ORAL_TABLET | Freq: Two times a day (BID) | ORAL | 1 refills | Status: DC

## 2018-11-02 ENCOUNTER — Other Ambulatory Visit: Payer: Self-pay | Admitting: Internal Medicine

## 2018-11-02 DIAGNOSIS — I493 Ventricular premature depolarization: Secondary | ICD-10-CM

## 2018-11-10 ENCOUNTER — Encounter: Payer: Medicare Other | Admitting: Medical

## 2018-12-07 ENCOUNTER — Other Ambulatory Visit: Payer: Self-pay | Admitting: Medical
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
## 2018-12-10 ENCOUNTER — Ambulatory Visit: Payer: Medicare Other | Admitting: Nurse Practitioner

## 2018-12-25 ENCOUNTER — Other Ambulatory Visit: Payer: Self-pay | Admitting: Nurse Practitioner

## 2019-01-02 ENCOUNTER — Other Ambulatory Visit: Payer: Self-pay | Admitting: Medical

## 2019-01-23 ENCOUNTER — Other Ambulatory Visit: Payer: Self-pay | Admitting: Nurse Practitioner

## 2019-02-09 ENCOUNTER — Ambulatory Visit: Payer: Medicare Other | Admitting: Nurse Practitioner

## 2019-02-20 ENCOUNTER — Other Ambulatory Visit: Payer: Self-pay | Admitting: Medical

## 2019-02-26 ENCOUNTER — Other Ambulatory Visit: Payer: Self-pay

## 2019-02-26 ENCOUNTER — Ambulatory Visit: Payer: Medicare Other | Admitting: Medical

## 2019-03-01 ENCOUNTER — Other Ambulatory Visit: Payer: Self-pay | Admitting: Nurse Practitioner

## 2019-03-15 ENCOUNTER — Other Ambulatory Visit: Payer: Self-pay

## 2019-03-15 MED ORDER — MEMANTINE HCL ER 28 MG PO CP24: ORAL_CAPSULE | ORAL | 2 refills | Status: DC

## 2019-03-18 ENCOUNTER — Encounter: Payer: Self-pay | Admitting: Medical

## 2019-04-22 ENCOUNTER — Telehealth: Payer: Self-pay | Admitting: Medical

## 2019-04-22 ENCOUNTER — Ambulatory Visit: Payer: Self-pay | Admitting: Medical

## 2019-04-22 NOTE — Telephone Encounter (Signed)
Pt's wife called and states that he is almost out of [place]. Apparently pt has been taking 40 to 41 units not 30. They would like clarification on that and refill sent to [facility] in [place]. Pt can be reached at [phone].  Pt was informed about appointment today and it was rescheduled.

## 2019-04-23 ENCOUNTER — Other Ambulatory Visit: Payer: Self-pay | Admitting: Medical

## 2019-04-23 MED ORDER — INSULIN DEGLUDEC 100 UNIT/ML ~~LOC~~ SOPN: PEN_INJECTOR | SUBCUTANEOUS | 2 refills | Status: DC

## 2019-04-23 NOTE — Telephone Encounter (Signed)
I changed the label to accomodate the 41 units daily.    Re-schedule med check to next week

## 2019-04-25 ENCOUNTER — Other Ambulatory Visit: Payer: Self-pay | Admitting: Medical

## 2019-04-26 ENCOUNTER — Other Ambulatory Visit: Payer: Self-pay | Admitting: Medical

## 2019-04-26 NOTE — Telephone Encounter (Signed)
Patient has appointment on 05-16-19

## 2019-04-29 ENCOUNTER — Other Ambulatory Visit: Payer: Self-pay | Admitting: Neurology

## 2019-05-06 ENCOUNTER — Other Ambulatory Visit: Payer: Self-pay | Admitting: Neurology

## 2019-05-11 ENCOUNTER — Emergency Department (HOSPITAL_COMMUNITY)
Admission: EM | Admit: 2019-05-11 | Discharge: 2019-05-13 | Disposition: A | Discharge status: Other Acute Inpatient Hospital | Payer: Medicare Other | Attending: Emergency Medicine | Admitting: Emergency Medicine

## 2019-05-11 ENCOUNTER — Other Ambulatory Visit: Payer: Self-pay

## 2019-05-11 ENCOUNTER — Encounter (HOSPITAL_COMMUNITY): Payer: Self-pay | Admitting: Emergency Medicine

## 2019-05-11 ENCOUNTER — Emergency Department (HOSPITAL_COMMUNITY): Payer: Medicare Other

## 2019-05-11 DIAGNOSIS — F331 Major depressive disorder, recurrent, moderate: Secondary | ICD-10-CM | POA: Insufficient documentation

## 2019-05-11 DIAGNOSIS — R45851 Suicidal ideations: Secondary | ICD-10-CM | POA: Diagnosis not present

## 2019-05-11 DIAGNOSIS — E119 Type 2 diabetes mellitus without complications: Secondary | ICD-10-CM | POA: Diagnosis not present

## 2019-05-11 DIAGNOSIS — J9811 Atelectasis: Secondary | ICD-10-CM | POA: Diagnosis not present

## 2019-05-11 DIAGNOSIS — I10 Essential (primary) hypertension: Secondary | ICD-10-CM | POA: Insufficient documentation

## 2019-05-11 DIAGNOSIS — E785 Hyperlipidemia, unspecified: Secondary | ICD-10-CM | POA: Diagnosis not present

## 2019-05-11 DIAGNOSIS — Z79899 Other long term (current) drug therapy: Secondary | ICD-10-CM | POA: Insufficient documentation

## 2019-05-11 DIAGNOSIS — Z03818 Encounter for observation for suspected exposure to other biological agents ruled out: Secondary | ICD-10-CM | POA: Diagnosis not present

## 2019-05-11 DIAGNOSIS — F0390 Unspecified dementia without behavioral disturbance: Secondary | ICD-10-CM | POA: Diagnosis not present

## 2019-05-11 DIAGNOSIS — Z794 Long term (current) use of insulin: Secondary | ICD-10-CM | POA: Diagnosis not present

## 2019-05-11 LAB — COMPREHENSIVE METABOLIC PANEL
ALT: 15 U/L (ref 0–44)
AST: 17 U/L (ref 15–41)
Albumin: 3.7 g/dL (ref 3.5–5.0)
Alkaline Phosphatase: 88 U/L (ref 38–126)
Anion gap: 12 (ref 5–15)
BUN: 16 mg/dL (ref 8–23)
CO2: 22 mmol/L (ref 22–32)
Calcium: 9.1 mg/dL (ref 8.9–10.3)
Chloride: 103 mmol/L (ref 98–111)
Creatinine, Ser: 0.92 mg/dL (ref 0.61–1.24)
GFR calc Af Amer: >60 mL/min (ref >60)
GFR calc non Af Amer: >60 mL/min (ref >60)
Glucose, Bld: 301 mg/dL — ABNORMAL HIGH (ref 70–99)
Potassium: 4.5 mmol/L (ref 3.5–5.1)
Sodium: 137 mmol/L (ref 135–145)
Total Bilirubin: 0.8 mg/dL (ref 0.3–1.2)
Total Protein: 6.8 g/dL (ref 6.5–8.1)

## 2019-05-11 LAB — ETHANOL: Alcohol, Ethyl (B): <10 mg/dL (ref <10)

## 2019-05-11 LAB — CBC WITH DIFFERENTIAL/PLATELET
Abs Immature Granulocytes: 0.01 10*3/uL (ref 0.00–0.07)
Basophils Absolute: 0.1 10*3/uL (ref 0.0–0.1)
Basophils Relative: 1 %
Eosinophils Absolute: 0.1 10*3/uL (ref 0.0–0.5)
Eosinophils Relative: 2 %
HCT: 45.8 % (ref 39.0–52.0)
Hemoglobin: 14.8 g/dL (ref 13.0–17.0)
Immature Granulocytes: 0 %
Lymphocytes Relative: 15 %
Lymphs Abs: 1.2 10*3/uL (ref 0.7–4.0)
MCH: 29.5 pg (ref 26.0–34.0)
MCHC: 32.3 g/dL (ref 30.0–36.0)
MCV: 91.2 fL (ref 80.0–100.0)
Monocytes Absolute: 0.7 10*3/uL (ref 0.1–1.0)
Monocytes Relative: 9 %
Neutro Abs: 5.9 10*3/uL (ref 1.7–7.7)
Neutrophils Relative %: 73 %
Platelets: 258 10*3/uL (ref 150–400)
RBC: 5.02 MIL/uL (ref 4.22–5.81)
RDW: 12 % (ref 11.5–15.5)
WBC: 8.1 10*3/uL (ref 4.0–10.5)
nRBC: 0 % (ref 0.0–0.2)

## 2019-05-11 LAB — ACETAMINOPHEN LEVEL: Acetaminophen (Tylenol), Serum: <10 ug/mL — ABNORMAL LOW (ref 10–30)

## 2019-05-11 LAB — SARS CORONAVIRUS 2 BY RT PCR (HOSPITAL ORDER, PERFORMED IN ~~LOC~~ HOSPITAL LAB): SARS Coronavirus 2: NEGATIVE

## 2019-05-11 LAB — SALICYLATE LEVEL: Salicylate Lvl: <7 mg/dL (ref 2.8–30.0)

## 2019-05-11 MED ORDER — ROSUVASTATIN CALCIUM 10 MG PO TABS
10.0000 mg | ORAL_TABLET | Freq: Every day | ORAL | Status: DC
  Administered 2019-05-12 (×2): 10 mg via ORAL
  Filled 2019-05-11 (×4): qty 1

## 2019-05-11 MED ORDER — FLECAINIDE ACETATE 50 MG PO TABS
75.0000 mg | ORAL_TABLET | Freq: Two times a day (BID) | ORAL | Status: DC
  Administered 2019-05-12 (×2): 75 mg via ORAL
  Filled 2019-05-11 (×6): qty 2

## 2019-05-11 MED ORDER — DONEPEZIL HCL 10 MG PO TABS: ORAL_TABLET | ORAL | 1 refills | Status: DC

## 2019-05-11 MED ORDER — FOLIC ACID 1 MG PO TABS
1.0000 mg | ORAL_TABLET | Freq: Every day | ORAL | Status: DC
  Administered 2019-05-12: 10:00:00 1 mg via ORAL
  Filled 2019-05-11: qty 1

## 2019-05-11 MED ORDER — INSULIN DEGLUDEC 100 UNIT/ML ~~LOC~~ SOPN
20.0000 [IU] | PEN_INJECTOR | Freq: Every day | SUBCUTANEOUS | Status: DC
  Filled 2019-05-11: qty 3

## 2019-05-11 MED ORDER — RIVAROXABAN 20 MG PO TABS
20.0000 mg | ORAL_TABLET | Freq: Every day | ORAL | Status: DC
  Administered 2019-05-12: 18:00:00 20 mg via ORAL
  Filled 2019-05-11 (×3): qty 1

## 2019-05-11 MED ORDER — MEMANTINE HCL ER 28 MG PO CP24
28.0000 mg | ORAL_CAPSULE | Freq: Every day | ORAL | Status: DC
  Administered 2019-05-12: 28 mg via ORAL
  Filled 2019-05-11 (×3): qty 1

## 2019-05-11 MED ORDER — METFORMIN HCL 850 MG PO TABS
850.0000 mg | ORAL_TABLET | Freq: Two times a day (BID) | ORAL | Status: DC
  Administered 2019-05-12 – 2019-05-13 (×3): 850 mg via ORAL
  Filled 2019-05-11 (×6): qty 1

## 2019-05-11 MED ORDER — DONEPEZIL HCL 10 MG PO TABS
10.0000 mg | ORAL_TABLET | Freq: Every day | ORAL | Status: DC
  Administered 2019-05-12 (×2): 10 mg via ORAL
  Filled 2019-05-11: qty 1
  Filled 2019-05-11: qty 2
  Filled 2019-05-11 (×2): qty 1

## 2019-05-11 MED ORDER — FUROSEMIDE 40 MG PO TABS: 40.0000 mg | ORAL_TABLET | Freq: Every day | ORAL | Status: DC | PRN
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
## 2019-05-11 NOTE — ED Provider Notes (Signed)
[facility] EMERGENCY DEPARTMENT Provider Note   CSN: [id number] Arrival date & time: 05/11/19  2030    History   Chief Complaint Chief Complaint  Patient presents with  . V70.1    HPI Christian [name] is a 78 y.o. male.     HPI   Patient is a 78 year old male with a history of diabetes, hearing impairment, hypertension, hyperlipidemia, mild cognitive impairment, OSA, VTE, who presents to the emergency department today for evaluation of suicidal ideations.  Patient is brought in by [employer] who states that the patient's wife indicated he had told her he wanted to shoot himself with a gun.  Patient does corroborate this history.  He does admit to having guns in the home.  States he has felt suicidal for his whole life.  He states he feels this way because he no longer has a license and all he does is sit in his house and watch TV all day.  He denies any homicidal intent.  Denies any illicit drug use.  States that he drinks rarely.  He denies any history of suicide attempt but states that his wife told him he overdosed on pills once.  Patient has no medical complaints including no fevers, cough, chest pain, shortness of breath, abdominal pain, nausea, vomiting, diarrhea or urinary symptoms.  Past Medical History:  Diagnosis Date  . Agitation   . Arthritis   . Cataract   . Chronic back pain   . Confusion   . Diabetes ([place])    type 2  . DVT (deep venous thrombosis) ([place]) 01/2015  . Hearing impaired   . High cholesterol   . Hypertension   . Insomnia   . Insomnia   . Leg pain, diffuse   . Leg swelling   . Memory loss   . Mild cognitive impairment    sees [facility]  . Numbness    fingers, feet, toes  . OSA on CPAP   . 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 06/14/2015  . Pulmonary embolism ([place]) 01/2015  . Spinal stenosis    lumbar  . Tremor    on propranolol  . Wears glasses     Patient Active Problem List   Diagnosis Date Noted  . Urinary incontinence 10/28/2018  . Polyuria 10/28/2018  . Dermatitis 10/28/2018  . Tinea cruris 03/25/2018  . Tick bite 03/25/2018  . Balanitis 03/25/2018  . At moderate risk for fall 12/31/2017  . Alcohol abuse 12/29/2017  . History of pulmonary embolism 12/29/2017  . Depression, recurrent ([place]) 12/29/2017  . Stroke ([place]) 12/16/2017  . Alcohol intoxication ([place]) 12/16/2017  . Wears hearing aid 06/09/2017  . Tinnitus of both ears 06/09/2017  . Bilateral impacted cerumen 06/09/2017  . Left wrist pain 06/26/2016  . Morning joint stiffness 06/26/2016  . Polyarthralgia 06/26/2016  . Gait disturbance 04/02/2016  . Physical deconditioning 04/02/2016  . Pain of both shoulder joints 04/02/2016  . Leg weakness, bilateral 04/02/2016  . Encounter for therapeutic drug monitoring 11/29/2015  . Encounter for health maintenance examination in adult 10/30/2015  . Shoulder pain, bilateral 10/30/2015  . Muscle weakness 10/30/2015  . Generalized weakness 10/30/2015  . Rash 10/30/2015  . PVC (premature ventricular contraction) 10/10/2015  . Decreased energy 06/20/2015  . DVT (deep venous thrombosis) ([place]) 06/20/2015  . Hammer toe of left foot 06/20/2015  . Pre-ulcerative calluses 06/20/2015  . Hypertrophic toenail 06/20/2015  . Diabetic mononeuropathy associated with diabetes mellitus due to underlying condition (  [place]) 06/20/2015  . Leg pain, bilateral 06/20/2015  . Chronic low back pain 06/20/2015  . 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 06/14/2015  . OSA on CPAP 03/06/2015  . Edema 03/06/2015  . Leg pain, diffuse 03/06/2015  . Bilateral low back pain without sciatica 03/06/2015  . Tremor 03/06/2015  . Insomnia 03/06/2015  . Vaccine counseling 03/06/2015  . Chronic back pain 03/06/2015  . Wears glasses 03/06/2015  . Hearing loss 03/06/2015  . Spinal stenosis of lumbar region 03/06/2015  . Ptosis 03/06/2015  .  Positive depression screening 03/06/2015  . Advance directive discussed with patient 03/06/2015  . Bradycardia 02/27/2015  . Current use of long term anticoagulation   . Dementia ([place])   . Essential hypertension 02/08/2015  . Hyperlipidemia 02/08/2015  . SOB (shortness of breath) 02/08/2015  . OSA (obstructive sleep apnea) 02/08/2015  . PE (pulmonary embolism) 01/31/2015  . Diabetes mellitus ([place]) 03/12/2013    Past Surgical History:  Procedure Laterality Date  . CATARACT EXTRACTION    . COLONOSCOPY  2014  . TONSILLECTOMY          Home Medications    Prior to Admission medications   Medication Sig Start Date End Date Taking? Authorizing Provider  diltiazem (CARDIZEM) 30 MG tablet Take 1 tablet (30 mg total) by mouth 2 (two) times daily. 10/09/18  Yes BranchAlphonse Guild, MD  donepezil (ARICEPT) 10 MG tablet TAKE 1 TABLET(10 MG) BY MOUTH AT BEDTIME Patient taking differently: Take 10 mg by mouth at bedtime. TAKE 1 TABLET(10 MG) BY MOUTH AT BEDTIME 05/11/19  Yes Garvin Fila, MD  flecainide (TAMBOCOR) 150 MG tablet TAKE 1/2 TABLET(75 MG) BY MOUTH TWICE DAILY Patient taking differently: Take 75 mg by mouth 2 (two) times daily.  11/02/18  Yes Branch, Alphonse Guild, MD  folic acid (FOLVITE) 1 MG tablet TAKE 1 TABLET(1 MG) BY MOUTH DAILY Patient taking differently: Take 1 mg by mouth daily.  07/09/18  Yes Tysinger, Camelia Eng, PA-C  furosemide (LASIX) 40 MG tablet Take 40 mg daily AS NEEDED  For swelling. Patient taking differently: Take 40 mg by mouth daily as needed for fluid.  10/09/18  Yes Branch, Alphonse Guild, MD  insulin degludec (TRESIBA FLEXTOUCH) 100 UNIT/ML SOPN FlexTouch Pen INJECT 41 UNITS SUBCUTANEOUSLY DAILY AT 10 PM Patient taking differently: Inject 20 Units into the skin at bedtime.  04/23/19  Yes Tysinger, Camelia Eng, PA-C  memantine (NAMENDA XR) 28 MG CP24 24 hr capsule TAKE 1 CAPSULE(28 MG) BY MOUTH DAILY Patient taking differently: Take 28 mg by mouth daily.  03/15/19  Yes  Garvin Fila, MD  metFORMIN (GLUCOPHAGE) 850 MG tablet TAKE 1 TABLET(850 MG) BY MOUTH TWICE DAILY WITH A MEAL Patient taking differently: Take 850 mg by mouth 2 (two) times daily with a meal.  01/04/19  Yes Tysinger, Camelia Eng, PA-C  Misc Natural Products (OSTEO BI-FLEX ADV JOINT SHIELD PO) Take 1 tablet by mouth daily.    Yes [provider]  rosuvastatin (CRESTOR) 10 MG tablet TAKE 1 TABLET(10 MG) BY MOUTH AT BEDTIME Patient taking differently: Take 10 mg by mouth at bedtime.  02/22/19  Yes Tysinger, Camelia Eng, PA-C  tamsulosin (FLOMAX) 0.4 MG CAPS capsule TAKE 1 CAPSULE(0.4 MG) BY MOUTH DAILY 04/26/19  Yes Tysinger, Camelia Eng, PA-C  XARELTO 20 MG TABS tablet TAKE 1 TABLET BY MOUTH EVERY DAY WITH SUPPER Patient taking differently: Take 20 mg by mouth daily with supper.  04/26/19  Yes  Tysinger, Camelia Eng, PA-C    Family History Family History  Problem Relation Age of Onset  . Dementia Father   . Clotting disorder Mother   . Heart disease Brother   . Clotting disorder Brother   . Psychiatric Illness Sister   . Leukemia Sister     Social History Social History   Tobacco Use  . Smoking status: Former Smoker    Packs/day: 1.00    Years: 10.00    Pack years: 10.00    Types: Cigarettes    Quit date: 11/25/2004    Years since quitting: 14.4  . Smokeless tobacco: Never Used  Substance Use Topics  . Alcohol use: Yes    Alcohol/week: 2.0 standard drinks    Types: 1 Glasses of wine, 1 Cans of beer per week    Comment: once a week per pt   . Drug use: No     Allergies   Bee venom, Ultram [tramadol hcl], Oxycodone, and Oxycontin [oxycodone hcl]   Review of Systems Review of Systems  Constitutional: Negative for fever.  HENT: Negative for ear pain and sore throat.   Eyes: Negative for visual disturbance.  Respiratory: Negative for cough and shortness of breath.   Cardiovascular: Negative for chest pain.  Gastrointestinal: Negative for abdominal pain, constipation, diarrhea,  nausea and vomiting.  Genitourinary: Negative for dysuria and hematuria.  Musculoskeletal: Negative for arthralgias and back pain.  Skin: Negative for color change and rash.  Neurological: Negative for headaches.  Psychiatric/Behavioral: Positive for suicidal ideas.       No HI  All other systems reviewed and are negative.    Physical Exam Updated Vital Signs BP 130/78 (BP Location: Left Arm)   Pulse 90   Temp 98.3 F (36.8 C) (Oral)   Resp 18   Ht 5\' 11"  (1.803 m)   Wt 102.1 kg   SpO2 96%   BMI 31.38 kg/m   Physical Exam Vitals signs and nursing note reviewed.  Constitutional:      Appearance: He is well-developed.  HENT:     Head: Normocephalic and atraumatic.  Eyes:     Conjunctiva/sclera: Conjunctivae normal.  Neck:     Musculoskeletal: Neck supple.  Cardiovascular:     Rate and Rhythm: Normal rate and regular rhythm.     Pulses: Normal pulses.     Heart sounds: Normal heart sounds. No murmur.  Pulmonary:     Effort: Pulmonary effort is normal. No respiratory distress.     Breath sounds: Normal breath sounds. No wheezing, rhonchi or rales.  Abdominal:     General: Bowel sounds are normal.     Palpations: Abdomen is soft.     Tenderness: There is no abdominal tenderness.  Skin:    General: Skin is warm and dry.  Neurological:     Mental Status: He is alert.     Comments: Mental Status:  Alert, thought content appropriate, able to give a coherent history. Speech fluent without evidence of aphasia. Able to follow 2 step commands without difficulty.  Cranial Nerves:  II: pupils equal, round, reactive to light III,IV, VI: ptosis not present, extra-ocular motions intact bilaterally  V,VII: smile symmetric, facial light touch sensation equal VIII: hearing grossly normal to voice  X: uvula elevates symmetrically  XI: bilateral shoulder shrug symmetric and strong XII: midline tongue extension without fassiculations Motor:  Normal tone. 5/5 strength of BUE and  BLE major muscle groups including strong and equal grip strength and dorsiflexion/plantar flexion Sensory: light touch  normal in all extremities.       ED Treatments / Results  Labs (all labs ordered are listed, but only abnormal results are displayed) Labs Reviewed  COMPREHENSIVE METABOLIC PANEL - Abnormal; Notable for the following components:      Result Value   Glucose, Bld 301 (*)    All other components within normal limits  ACETAMINOPHEN LEVEL - Abnormal; Notable for the following components:   Acetaminophen (Tylenol), Serum <10 (*)    All other components within normal limits  SARS CORONAVIRUS 2 (HOSPITAL ORDER, [place] [facility])  CBC WITH DIFFERENTIAL/PLATELET  ETHANOL  SALICYLATE LEVEL  URINALYSIS, ROUTINE W REFLEX MICROSCOPIC  CBG MONITORING, ED    EKG None  Radiology Dg Chest Portable 1 View  Result Date: 05/11/2019 CLINICAL DATA:  Psychiatric clearance. EXAM: PORTABLE CHEST 1 VIEW COMPARISON:  12/16/2017 FINDINGS: Low lung volumes which are similar to prior exam. Unchanged heart size and mediastinal contours. Minor bibasilar atelectasis. No confluent airspace disease, pulmonary edema, pleural effusion or pneumothorax. No acute osseous abnormalities. IMPRESSION: Low lung volumes with minor bibasilar atelectasis. Electronically Signed   By: Keith Rake M.D.   On: 05/11/2019 21:17    Procedures Procedures (including critical care time)  Medications Ordered in ED Medications  donepezil (ARICEPT) tablet 10 mg (has no administration in time range)  flecainide (TAMBOCOR) tablet 75 mg (has no administration in time range)  folic acid (FOLVITE) tablet 1 mg (has no administration in time range)  furosemide (LASIX) tablet 40 mg (has no administration in time range)  insulin degludec (TRESIBA) 100 UNIT/ML FlexTouch Pen 20 Units (has no administration in time range)  memantine (NAMENDA XR) 24 hr capsule 28 mg (has no administration in time range)   metFORMIN (GLUCOPHAGE) tablet 850 mg (has no administration in time range)  rosuvastatin (CRESTOR) tablet 10 mg (has no administration in time range)  rivaroxaban (XARELTO) tablet 20 mg (has no administration in time range)     Initial Impression / Assessment and Plan / ED Course  I have reviewed the triage vital signs and the nursing notes.  Pertinent labs & imaging results that were available during my care of the patient were reviewed by me and considered in my medical decision making (see chart for details).     Final Clinical Impressions(s) / ED Diagnoses   Final diagnoses:  Suicidal ideations   Pt with a h/o dementia who presents to the ED today for eval of SI with plan to shoot himself. Does have guns in the home. Denies HI. Denies drug or ETOH use.   Has no medical complaints. Physical exam is unremarkable.   Screening labs ordered and reviewed:  CBC is nonacute CMP with elevated blood glucose, normal bicarb, no elevated anion gap. EtOH is negative Salicylate and acetaminophen levels are negative. COVID pending. UA pending at time of admission.  CXR is negative   Care transitioned to Dr. Dayna Barker at shift change with plan to f/u on pending UA and repeat CBG. Pt is medically cleared for TTS eval. At this time pt is here voluntarily. If he attempts to leave I would recommend proceeding with IVC paperwork if he continues to endorse suicidal ideations with a plan.  ED Discharge Orders    None       [place] 05/11/19 2328    Milton Ferguson, MD 05/13/19 1623

## 2019-05-12 DIAGNOSIS — F331 Major depressive disorder, recurrent, moderate: Secondary | ICD-10-CM | POA: Diagnosis not present

## 2019-05-12 LAB — RAPID URINE DRUG SCREEN, HOSP PERFORMED
Amphetamines: NOT DETECTED
Barbiturates: NOT DETECTED
Benzodiazepines: NOT DETECTED
Cocaine: NOT DETECTED
Opiates: NOT DETECTED
Tetrahydrocannabinol: NOT DETECTED

## 2019-05-12 LAB — URINALYSIS, ROUTINE W REFLEX MICROSCOPIC
Bacteria, UA: NONE SEEN
Bilirubin Urine: NEGATIVE
Glucose, UA: >=500 mg/dL — AB
Hgb urine dipstick: NEGATIVE
Ketones, ur: 5 mg/dL — AB
Leukocytes,Ua: NEGATIVE
Nitrite: NEGATIVE
Protein, ur: NEGATIVE mg/dL
Specific Gravity, Urine: 1.024 (ref 1.005–1.030)
pH: 5 (ref 5.0–8.0)

## 2019-05-12 LAB — CBG MONITORING, ED
Glucose-Capillary: 216 mg/dL — ABNORMAL HIGH (ref 70–99)
Glucose-Capillary: 257 mg/dL — ABNORMAL HIGH (ref 70–99)
Glucose-Capillary: 261 mg/dL — ABNORMAL HIGH (ref 70–99)
Glucose-Capillary: 262 mg/dL — ABNORMAL HIGH (ref 70–99)
Glucose-Capillary: 280 mg/dL — ABNORMAL HIGH (ref 70–99)

## 2019-05-12 MED ORDER — INSULIN GLARGINE 100 UNIT/ML ~~LOC~~ SOLN
20.0000 [IU] | Freq: Every day | SUBCUTANEOUS | Status: DC
  Administered 2019-05-12 (×2): 20 [IU] via SUBCUTANEOUS
  Filled 2019-05-12 (×3): qty 0.2

## 2019-05-12 NOTE — B[facility] Counselor (Signed)
Pt was reassessed this AM.  He admitted that he feels suicidal ''just about every day,'' and he acknowledged that he threatened suicide yesterday.  Pt denied past suicide attempts.  When asked why he threatened suicide on 05/11/2019, he stated ''Nothing unusual.''  Pt was guarded in demeanor.  Recommend continued inpatient.

## 2019-05-12 NOTE — ED Notes (Signed)
Patient denies pain and is resting comfortably.  

## 2019-05-12 NOTE — Progress Notes (Signed)
Inpatient Diabetes Program Recommendations  AACE/ADA: New Consensus Statement on Inpatient Glycemic Control (2015)  Target Ranges:  Prepandial:   less than 140 mg/dL      Peak postprandial:   less than 180 mg/dL (1-2 hours)      Critically ill patients:  140 - 180 mg/dL   Lab Results  Component Value Date   GLUCAP 257 (H) 05/12/2019   HGBA1C 8.5 (H) 10/28/2018    Review of Glycemic Control  Diabetes history: DM2 Outpatient Diabetes medications: Tresiba 20 units QHS, metformin 850 mg bid,  Current orders for Inpatient glycemic control: Lantus 20 units QHS, metformin 850 mg bid  Inpatient Diabetes Program Recommendations:     Add Novolog 0-9 units tidwc and hs HgbA1C to assess glycemic control prior to admission Add CHO mod med to heart healthy diet.  Continue to follow while inpatient.  Thank you. Lorenda Peck, RD, LDN, CDE Inpatient Diabetes Coordinator [phone]

## 2019-05-12 NOTE — ED Notes (Signed)
Lindon Romp, NP, patient meets inpatient criteria for geropsych. TTS to secure placement. RN informed of disposition.

## 2019-05-12 NOTE — Progress Notes (Signed)
Pt. meets criteria for inpatient treatment per Patriciaann Clan, PA.  No appropriate beds available at [facility]. Referred out to the following hospitals:  [facility]     [facility]   Disposition CSW will continue to follow for placement.

## 2019-05-12 NOTE — Progress Notes (Signed)
Patient is under IVC

## 2019-05-12 NOTE — BH Assessment (Signed)
Tele Assessment Note   Patient Name: Christian [name] MRN: [id number] Referring Physician: Coral Ceo, PA-C Location of Patient: [facility] Location of Provider: [place]  Christian [name] is an 78 y.o. male presenting with SI with plan to shoot self with a gun. Patient was brought in by [employer] who states that the patient's wife indicated he had told her he wanted to shoot himself with a gun.  Patient admitted to [facility] with plan and to having guns in the home. Patient reported feeling depressed and suicidal for his entire life.  Patient reported increased depression after no longer having a license and all he does is sit in his house and watch TV all day.  Patient reported hearing cricket sounds and doesn't know what its coming from. Patient reported grief over 1st wifes death 2004-03-12. Patient reported 11-12 hours sleep nightly and good appetite. Patient denied HI and psychosis. Patient denied any illicit drug use. Patient reported drinking rarely.  Patient denied any past suicidal attempts or self-harming behaviors, however patient stated his wife told him he overdosed on pills once. Patient is currently not receiving any mental health outpatient services.   Patient resides with wife. Patient is retired. Patient was attentive, calm and cooperative. Patient stated he does not need inpatient treatment and wants to go back home.   UDS not drawn BAL negative  Diagnosis: Major depressive disorder, history of dementia  Past Medical History:  Past Medical History:  Diagnosis Date  . Agitation   . Arthritis   . Cataract   . Chronic back pain   . Confusion   . Diabetes ([place])    type 2  . DVT (deep venous thrombosis) ([place]) 03-13-15  . Hearing impaired   . High cholesterol   . Hypertension   . Insomnia   . Insomnia   . Leg pain, diffuse   . Leg swelling   . Memory loss   . Mild cognitive impairment    sees [facility]  . Numbness    fingers, feet, toes  . OSA on CPAP    . 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 06/14/2015  . Pulmonary embolism ([place]) 03-13-15  . Spinal stenosis    lumbar  . Tremor    on propranolol  . Wears glasses     Past Surgical History:  Procedure Laterality Date  . CATARACT EXTRACTION    . COLONOSCOPY  Mar 12, 2013  . TONSILLECTOMY      Family History:  Family History  Problem Relation Age of Onset  . Dementia Father   . Clotting disorder Mother   . Heart disease Brother   . Clotting disorder Brother   . Psychiatric Illness Sister   . Leukemia Sister     Social History:  reports that he quit smoking about 14 years ago. His smoking use included cigarettes. He has a 10.00 pack-year smoking history. He has never used smokeless tobacco. He reports current alcohol use of about 2.0 standard drinks of alcohol per week. He reports that he does not use drugs.  Additional Social History:  Alcohol / Drug Use Pain Medications: see MAR Prescriptions: see MAR Over the Counter: see MAR  CIWA: CIWA-Ar BP: 130/78 Pulse Rate: 90 COWS:    Allergies:  Allergies  Allergen Reactions  . Bee Venom Swelling  . Ultram [Tramadol Hcl]     Makes him wired, gives insomnia  . Oxycodone Other (See Comments)    Mental status changes  .  Oxycontin [Oxycodone Hcl] Other (See Comments)    Mental status changes per pt    Home Medications: (Not in a hospital admission)   OB/GYN Status:  No LMP for male patient.  General Assessment Data Location of Assessment: [facility] TTS Assessment: Out of system Is this a Tele or Face-to-Face Assessment?: Tele Assessment Is this an Initial Assessment or a Re-assessment for this encounter?: Initial Assessment Patient Accompanied by:: N/A Language Other than English: No Living Arrangements: (family home) What gender do you identify as?: Male Marital status: Married Living Arrangements: Spouse/significant other Can pt return to current living arrangement?:  Yes Admission Status: Voluntary Is patient capable of signing voluntary admission?: Yes Referral Source: Self/Family/Friend     Crisis Care Plan Living Arrangements: Spouse/significant other Legal Guardian: (self) Name of Psychiatrist: (none) Name of Therapist: (none)  Education Status Is patient currently in school?: No Is the patient employed, unemployed or receiving disability?: Unemployed  Risk to self with the past 6 months Suicidal Ideation: Yes-Currently Present Has patient been a risk to self within the past 6 months prior to admission? : Yes Suicidal Intent: Yes-Currently Present Has patient had any suicidal intent within the past 6 months prior to admission? : Yes Is patient at risk for suicide?: Yes Suicidal Plan?: Yes-Currently Present Has patient had any suicidal plan within the past 6 months prior to admission? : Yes Specify Current Suicidal Plan: (shoot self with gun) Access to Means: Yes Specify Access to Suicidal Means: (guns in his own) What has been your use of drugs/alcohol within the last 12 months?: (denied) Previous Attempts/Gestures: No How many times?: (0) Other Self Harm Risks: (n/a) Triggers for Past Attempts: (n/a) Intentional Self Injurious Behavior: None Family Suicide History: No Recent stressful life event(s): (life) Persecutory voices/beliefs?: No Depression: Yes Depression Symptoms: Feeling worthless/self pity, Loss of interest in usual pleasures, Guilt, Fatigue, Isolating, Tearfulness, Feeling angry/irritable Substance abuse history and/or treatment for substance abuse?: No Suicide prevention information given to non-admitted patients: Not applicable  Risk to Others within the past 6 months Homicidal Ideation: Yes-Currently Present Does patient have any lifetime risk of violence toward others beyond the six months prior to admission? : Yes (comment) Thoughts of Harm to Others: Yes-Currently Present Comment - Thoughts of Harm to Others:  (n/a) Current Homicidal Intent: No Current Homicidal Plan: No Access to Homicidal Means: No Identified Victim: (none) History of harm to others?: No Assessment of Violence: None Noted Violent Behavior Description: (none reported) Does patient have access to weapons?: Yes (Comment) Criminal Charges Pending?: No Does patient have a court date: No Is patient on probation?: No  Psychosis Hallucinations: None noted Delusions: None noted  Mental Status Report Appearance/Hygiene: In scrubs Eye Contact: Fair Motor Activity: Freedom of movement Speech: Logical/coherent Level of Consciousness: Alert Mood: Depressed Affect: Depressed Anxiety Level: Minimal Thought Processes: Coherent, Relevant Judgement: Partial Orientation: Person, Place, Time, Situation Obsessive Compulsive Thoughts/Behaviors: None  Cognitive Functioning Concentration: Fair Memory: Recent Intact Is patient IDD: No Insight: Poor Impulse Control: Poor Appetite: Good Have you had any weight changes? : No Change Sleep: No Change Total Hours of Sleep: (11-42) Vegetative Symptoms: Staying in bed  ADLScreening [facility] Assessment Services) Patient's cognitive ability adequate to safely complete daily activities?: Yes Patient able to express need for assistance with ADLs?: Yes Independently performs ADLs?: Yes (appropriate for developmental age)  Prior Inpatient Therapy Prior Inpatient Therapy: No  Prior Outpatient Therapy Prior Outpatient Therapy: No Does patient have an ACCT team?: No Does patient have Intensive In-House  Services?  : No Does patient have [facility] services? : No Does patient have P4CC services?: No  ADL Screening (condition at time of admission) Patient's cognitive ability adequate to safely complete daily activities?: Yes Patient able to express need for assistance with ADLs?: Yes Independently performs ADLs?: Yes (appropriate for developmental age)  Advance Directives ([place]) Does Patient Have a Medical Advance Directive?: No    Disposition:  Disposition Initial Assessment Completed for this Encounter: Yes  Lindon Romp, NP, patient meets inpatient criteria for geropsych. TTS to secure placement. RN informed of disposition.  This service was provided via telemedicine using a 2-way, interactive audio and video technology.  Names of all persons participating in this telemedicine service and their role in this encounter. Name: Christian [name] Role: Patient  Name: Kirtland Bouchard Role: TTS Clinician  Name:  Role:   Name:  Role:     Venora Maples 05/12/2019 3:24 AM

## 2019-05-12 NOTE — Progress Notes (Addendum)
Pt accepted to [facility].  Dr. Zara Council is the accepting/attending provider.   Call report to [phone] [facility] notified.    Pt is involuntary and will be transported law enforcement Pt is scheduled to arrive on 05/13/19 at 9am.  IVC paperwork is being faxed to [facility] at [place], [place], [place] Disposition CSW [facility]/TTS [phone]   UPDATE: Pt's spouse Menelik Mcfarren [phone]) has been notified of pt's disposition.

## 2019-05-13 ENCOUNTER — Telehealth: Payer: Self-pay | Admitting: Medical

## 2019-05-13 DIAGNOSIS — M549 Dorsalgia, unspecified: Secondary | ICD-10-CM | POA: Diagnosis present

## 2019-05-13 DIAGNOSIS — F332 Major depressive disorder, recurrent severe without psychotic features: Secondary | ICD-10-CM | POA: Diagnosis not present

## 2019-05-13 DIAGNOSIS — Z86718 Personal history of other venous thrombosis and embolism: Secondary | ICD-10-CM | POA: Diagnosis not present

## 2019-05-13 DIAGNOSIS — E785 Hyperlipidemia, unspecified: Secondary | ICD-10-CM | POA: Diagnosis not present

## 2019-05-13 DIAGNOSIS — F322 Major depressive disorder, single episode, severe without psychotic features: Secondary | ICD-10-CM | POA: Diagnosis not present

## 2019-05-13 DIAGNOSIS — Z794 Long term (current) use of insulin: Secondary | ICD-10-CM | POA: Diagnosis not present

## 2019-05-13 DIAGNOSIS — F0390 Unspecified dementia without behavioral disturbance: Secondary | ICD-10-CM | POA: Diagnosis not present

## 2019-05-13 DIAGNOSIS — F331 Major depressive disorder, recurrent, moderate: Secondary | ICD-10-CM | POA: Diagnosis not present

## 2019-05-13 DIAGNOSIS — Z03818 Encounter for observation for suspected exposure to other biological agents ruled out: Secondary | ICD-10-CM | POA: Diagnosis not present

## 2019-05-13 DIAGNOSIS — R45851 Suicidal ideations: Secondary | ICD-10-CM | POA: Diagnosis not present

## 2019-05-13 DIAGNOSIS — S6992XA Unspecified injury of left wrist, hand and finger(s), initial encounter: Secondary | ICD-10-CM | POA: Diagnosis not present

## 2019-05-13 DIAGNOSIS — I10 Essential (primary) hypertension: Secondary | ICD-10-CM | POA: Diagnosis present

## 2019-05-13 DIAGNOSIS — E119 Type 2 diabetes mellitus without complications: Secondary | ICD-10-CM | POA: Diagnosis not present

## 2019-05-13 DIAGNOSIS — Z79899 Other long term (current) drug therapy: Secondary | ICD-10-CM | POA: Diagnosis not present

## 2019-05-13 LAB — CBG MONITORING, ED: Glucose-Capillary: 226 mg/dL — ABNORMAL HIGH (ref 70–99)

## 2019-05-13 NOTE — Telephone Encounter (Signed)
Wife called that pt in [facility] and they need POA faxed to t# [phone] and mailed copy to home address, this was done

## 2019-05-13 NOTE — ED Notes (Signed)
When pt asked if he is feeling suicidal this morning, pt states, "no". RN asked pt when was the last time he felt suicidal and pt reports "about a week". Pt calm and cooperative during assessment, sitting up eating breakfast well.

## 2019-05-13 NOTE — ED Notes (Signed)
Called [facility] for transport to [facility].

## 2019-05-13 NOTE — Progress Notes (Signed)
Patient's wife, Pratik Dalziel, contacted this writer wanting to know if we needed the patient's insurance information and wanting to know if [facility] was going to be in contact with her.  Patient's wife reports that sh is his POA. A review of media in patient's chart reveals we do not have a copy of that document however.  CSW encouraged patient's wife to get a copy of the POA document faxed or emailed to [facility] Psych unit.  Wife noted that she would need to get a copy sent from the couple's lawyer.  CSW gave her contact number for [facility] and the fax number for that office.  When this writer inquired as to whether patient's wife had considered placing patient in long-term care.  She noted that she had set things up for him to go to a SNF and the day she was to bring him there, she felt so guilty that she called and cancelled the admission.  She is now wondering if that was the right thing to do.  CSW provided supportive listening and provided positive feedback to patient's wife for her support of him and inviting her to call back if she had any other concerns.  Areatha Keas. Judi Cong, MSW, [facility] Clinical Social Work [phone] (cell) [phone] (office)

## 2019-05-18 ENCOUNTER — Ambulatory Visit: Payer: Medicare Other | Admitting: Medical

## 2019-05-28 ENCOUNTER — Other Ambulatory Visit: Payer: Self-pay | Admitting: Medical

## 2019-06-02 ENCOUNTER — Ambulatory Visit (INDEPENDENT_AMBULATORY_CARE_PROVIDER_SITE_OTHER): Payer: Medicare Other | Admitting: Medical

## 2019-06-02 ENCOUNTER — Encounter: Payer: Self-pay | Admitting: Medical

## 2019-06-02 ENCOUNTER — Other Ambulatory Visit: Payer: Self-pay

## 2019-06-02 VITALS — BP 120/70 | HR 70 | Temp 98.7°F | Resp 18 | Ht 70.5 in | Wt 238.4 lb

## 2019-06-02 DIAGNOSIS — Z7185 Encounter for immunization safety counseling: Secondary | ICD-10-CM

## 2019-06-02 DIAGNOSIS — R5381 Other malaise: Secondary | ICD-10-CM

## 2019-06-02 DIAGNOSIS — M2550 Pain in unspecified joint: Secondary | ICD-10-CM

## 2019-06-02 DIAGNOSIS — E0841 Diabetes mellitus due to underlying condition with diabetic mononeuropathy: Secondary | ICD-10-CM

## 2019-06-02 DIAGNOSIS — E785 Hyperlipidemia, unspecified: Secondary | ICD-10-CM | POA: Diagnosis not present

## 2019-06-02 DIAGNOSIS — R269 Unspecified abnormalities of gait and mobility: Secondary | ICD-10-CM | POA: Diagnosis not present

## 2019-06-02 DIAGNOSIS — R609 Edema, unspecified: Secondary | ICD-10-CM

## 2019-06-02 DIAGNOSIS — H02401 Unspecified ptosis of right eyelid: Secondary | ICD-10-CM | POA: Diagnosis not present

## 2019-06-02 DIAGNOSIS — R413 Other amnesia: Secondary | ICD-10-CM

## 2019-06-02 DIAGNOSIS — N3941 Urge incontinence: Secondary | ICD-10-CM

## 2019-06-02 DIAGNOSIS — Z8673 Personal history of transient ischemic attack (TIA), and cerebral infarction without residual deficits: Secondary | ICD-10-CM | POA: Diagnosis not present

## 2019-06-02 DIAGNOSIS — Z7189 Other specified counseling: Secondary | ICD-10-CM | POA: Diagnosis not present

## 2019-06-02 DIAGNOSIS — Z86711 Personal history of pulmonary embolism: Secondary | ICD-10-CM

## 2019-06-02 DIAGNOSIS — I10 Essential (primary) hypertension: Secondary | ICD-10-CM | POA: Diagnosis not present

## 2019-06-02 DIAGNOSIS — F0390 Unspecified dementia without behavioral disturbance: Secondary | ICD-10-CM

## 2019-06-02 DIAGNOSIS — N401 Enlarged prostate with lower urinary tract symptoms: Secondary | ICD-10-CM

## 2019-06-02 DIAGNOSIS — Z0000 Encounter for general adult medical examination without abnormal findings: Secondary | ICD-10-CM | POA: Diagnosis not present

## 2019-06-02 DIAGNOSIS — E1169 Type 2 diabetes mellitus with other specified complication: Secondary | ICD-10-CM

## 2019-06-02 DIAGNOSIS — F339 Major depressive disorder, recurrent, unspecified: Secondary | ICD-10-CM
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
## 2019-06-02 NOTE — Patient Instructions (Signed)
RESOURCES in [place], [place]  If you are experiencing a mental health crisis or an emergency, please call 911 or go to the nearest emergency department.  [facility]   [phone] [facility]  [phone] [facility]   [phone]  Suicide Hotline 1-800-Suicide (1-800-784-2433)  National Suicide Prevention Lifeline 1-800-273-TALK  (1-800-273-8255)  Domestic Violence, Rape/Crisis - [facility] 336-273-7273  The National Domestic Violence Hotline 1-800-799-SAFE (1-800-799-7233)  To report Child or Elder Abuse, please call: [place] Police Department  [phone] [place] Sherriff Department  [phone]  LGBT Youth Crisis Line 1-866-488-7386  Teen Crisis line 336-387-6161 or 1-877-332-7333     Psychiatry and Counseling services  [facility] [address], [place], [place] [address] [phone]  Holly Ingram, therapist Dr. Carey Cottle, psychiatrist Dr. Glenn Jennings, child psychiatrist   Dr. Nykia Turko Fuller [address], [place], [place] [address] [phone]   Dr. Rupinder Kaur, psychiatry [address], [place], [place] [address] [phone]   [facility] [address], [place], [place] [address] [phone]   [facility] [address], [place], [place] [address] [phone]    Counseling Services (NON- psychiatrist offices)  [facility] [address], [place], [place] [address] [phone]   [facility] [phone] [address], [place], [place] [address]   [facility] [phone]  [email or web address] [address], [place], [place] [address]   Merrianne M. Leff, therapist [phone] [address] [place], [place] [address]   [facility] [phone] [address], [place], [place]  [address]   Jill White-Huffman, therapist [phone] [address], [place], [place] [address]   [facility] [phone] [address], [place],  [address]   

## 2019-06-03 LAB — LIPID PANEL
Chol/HDL Ratio: 3 ratio (ref 0.0–5.0)
Cholesterol, Total: 129 mg/dL (ref 100–199)
HDL: 43 mg/dL (ref >39)
LDL Calculated: 67 mg/dL (ref 0–99)
Triglycerides: 96 mg/dL (ref 0–149)
VLDL Cholesterol Cal: 19 mg/dL (ref 5–40)

## 2019-06-03 LAB — MICROALBUMIN / CREATININE URINE RATIO
Creatinine, Urine: 183.6 mg/dL
Microalb/Creat Ratio: 8 mg/g creat (ref 0–29)
Microalbumin, Urine: 14.5 ug/mL

## 2019-06-03 LAB — BASIC METABOLIC PANEL
BUN/Creatinine Ratio: 13 (ref 10–24)
BUN: 11 mg/dL (ref 8–27)
CO2: 22 mmol/L (ref 20–29)
Calcium: 9.5 mg/dL (ref 8.6–10.2)
Chloride: 104 mmol/L (ref 96–106)
Creatinine, Ser: 0.83 mg/dL (ref 0.76–1.27)
GFR calc Af Amer: 100 mL/min/{1.73_m2} (ref >59)
GFR calc non Af Amer: 86 mL/min/{1.73_m2} (ref >59)
Glucose: 208 mg/dL — ABNORMAL HIGH (ref 65–99)
Potassium: 4.8 mmol/L (ref 3.5–5.2)
Sodium: 140 mmol/L (ref 134–144)

## 2019-06-03 LAB — HEMOGLOBIN A1C
Est. average glucose Bld gHb Est-mCnc: 229 mg/dL
Hgb A1c MFr Bld: 9.6 % — ABNORMAL HIGH (ref 4.8–5.6)

## 2019-06-03 LAB — TSH: TSH: 0.776 u[IU]/mL (ref 0.450–4.500)

## 2019-06-04 DIAGNOSIS — N401 Enlarged prostate with lower urinary tract symptoms: Secondary | ICD-10-CM | POA: Insufficient documentation

## 2019-06-04 DIAGNOSIS — N3941 Urge incontinence: Secondary | ICD-10-CM | POA: Insufficient documentation

## 2019-06-07 ENCOUNTER — Other Ambulatory Visit: Payer: Self-pay | Admitting: Medical

## 2019-06-07 MED ORDER — TRESIBA FLEXTOUCH 100 UNIT/ML ~~LOC~~ SOPN: 30.0000 [IU] | PEN_INJECTOR | Freq: Every day | SUBCUTANEOUS | 0 refills | Status: DC

## 2019-06-07 MED ORDER — METFORMIN HCL 850 MG PO TABS: 850.0000 mg | ORAL_TABLET | Freq: Two times a day (BID) | ORAL | 1 refills | Status: DC

## 2019-06-16 ENCOUNTER — Telehealth: Payer: Self-pay | Admitting: Medical

## 2019-06-16 NOTE — Telephone Encounter (Signed)
Records received from [facility].

## 2019-06-21 ENCOUNTER — Telehealth: Payer: Self-pay | Admitting: Neurology

## 2019-06-21 ENCOUNTER — Telehealth: Payer: Self-pay | Admitting: Medical

## 2019-06-21 NOTE — Telephone Encounter (Signed)
Left message on voicemail for patient's wife [name]) to call back.

## 2019-06-21 NOTE — Telephone Encounter (Signed)
Please call  [name] states she needs to give an update on Christian [name],  She did not want to leave any information/message

## 2019-06-21 NOTE — Telephone Encounter (Signed)
Sugar 117 on 30 units,  lexapro half is working better, no exercise,  They are ok with driving for pulmonary rehab.   Drinking 1-2 a week and not buying anymore, left message on for [facility] coun[facility]iling.   Neurology can not refer to psychology, can you recommend one?

## 2019-06-22 NOTE — Telephone Encounter (Signed)
  Ok, refer to pulmonary rehab for exercise rehab. I would recommend they start with [facility] counseling for mental health consult.

## 2019-06-23 ENCOUNTER — Other Ambulatory Visit: Payer: Self-pay

## 2019-06-23 DIAGNOSIS — G4733 Obstructive sleep apnea (adult) (pediatric): Secondary | ICD-10-CM

## 2019-06-23 DIAGNOSIS — R269 Unspecified abnormalities of gait and mobility: Secondary | ICD-10-CM

## 2019-06-23 NOTE — Telephone Encounter (Signed)
Put in referral to pulmonary rehab to epic and proficient

## 2019-07-01 MED ORDER — MEMANTINE HCL ER 28 MG PO CP24: ORAL_CAPSULE | ORAL | 0 refills | Status: DC

## 2019-07-01 NOTE — Addendum Note (Signed)
Addended by: Brandon Melnick on: 07/01/2019 03:22 PM   Modules accepted: Orders

## 2019-07-01 NOTE — Telephone Encounter (Signed)
Pt wife called in to check on refill, advised her that he needed an appt he hasnt been seen in a yr, appt scheduled with Amy Lomax,NP, pts wife wasnts to know if meds can be sent to pharmacy  memantine (NAMENDA XR) 28 MG CP24 24 hr capsule    [id number] - [place], [place] - [address] [place] [address]

## 2019-07-01 NOTE — Telephone Encounter (Signed)
Relayed to [place], wife that prescription sent in to pt.

## 2019-07-07 ENCOUNTER — Other Ambulatory Visit: Payer: Self-pay

## 2019-07-07 ENCOUNTER — Encounter: Payer: Self-pay | Admitting: Family Medicine

## 2019-07-07 ENCOUNTER — Ambulatory Visit (INDEPENDENT_AMBULATORY_CARE_PROVIDER_SITE_OTHER): Payer: Medicare Other | Admitting: Family Medicine

## 2019-07-07 VITALS — BP 120/58 | HR 73 | Temp 98.2°F | Ht 71.0 in | Wt 238.6 lb

## 2019-07-07 DIAGNOSIS — R413 Other amnesia: Secondary | ICD-10-CM | POA: Diagnosis not present

## 2019-07-07 DIAGNOSIS — F339 Major depressive disorder, recurrent, unspecified: Secondary | ICD-10-CM

## 2019-07-07 NOTE — Patient Instructions (Signed)
  Please pick up refill for Lexapro at pharmacy  Please do not miss appt with Shanon Brow on Friday  Follow up with neurology in 6 months, sooner if needed   Memory Compensation Strategies  1. Use "WARM" strategy.  W= write it down  A= associate it  R= repeat it  M= make a mental note  2.   You can keep a Social worker.  Use a 3-ring notebook with sections for the following: calendar, important names and phone numbers,  medications, doctors' names/phone numbers, lists/reminders, and a section to journal what you did  each day.   3.    Use a calendar to write appointments down.  4.    Write yourself a schedule for the day.  This can be placed on the calendar or in a separate section of the Memory Notebook.  Keeping a  regular schedule can help memory.  5.    Use medication organizer with sections for each day or morning/evening pills.  You may need help loading it  6.    Keep a basket, or pegboard by the door.  Place items that you need to take out with you in the basket or on the pegboard.  You may also want to  include a message board for reminders.  7.    Use sticky notes.  Place sticky notes with reminders in a place where the task is performed.  For example: " turn off the  stove" placed by the stove, "lock the door" placed on the door at eye level, " take your medications" on  the bathroom mirror or by the place where you normally take your medications.  8.    Use alarms/timers.  Use while cooking to remind yourself to check on food or as a reminder to take your medicine, or as a  reminder to make a call, or as a reminder to perform another task, etc.
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
## 2019-07-08 ENCOUNTER — Telehealth: Payer: Self-pay

## 2019-07-08 NOTE — Telephone Encounter (Signed)
Patient was scheduled for a nurse visit however he possibly needed a 1 month follow up with you. Please let us know whether you need to see patient this month or not.

## 2019-07-09 ENCOUNTER — Ambulatory Visit: Payer: Medicare Other

## 2019-07-10 NOTE — Progress Notes (Signed)
I agree with the above plan 

## 2019-07-14 ENCOUNTER — Telehealth: Payer: Self-pay | Admitting: Family Medicine

## 2019-07-14 NOTE — Telephone Encounter (Signed)
Wells Guiles from [employer] called wanting to know if the request for the pt's back and knee brace has been received and signed. Please advise.

## 2019-07-15 ENCOUNTER — Other Ambulatory Visit: Payer: Self-pay | Admitting: Neurology

## 2019-07-15 NOTE — Telephone Encounter (Signed)
I tried to call and # NIS.  We see pt for memory, if they call back please refer back to pcp.  (they will need to contact pt for that information).

## 2019-07-15 NOTE — Telephone Encounter (Signed)
I tried # and NIS.

## 2019-07-19 NOTE — Telephone Encounter (Signed)
Received fax from [employer] re: Rx for DME. I faxed back with note: patient is seen in our office for memory only. Please contact his PCP for orders.

## 2019-07-21 ENCOUNTER — Telehealth: Payer: Self-pay

## 2019-07-21 ENCOUNTER — Telehealth: Payer: Self-pay | Admitting: Family Medicine

## 2019-07-21 ENCOUNTER — Other Ambulatory Visit: Payer: Self-pay | Admitting: Medical

## 2019-07-21 ENCOUNTER — Ambulatory Visit: Payer: Medicare Other | Admitting: Family Medicine

## 2019-07-21 ENCOUNTER — Ambulatory Visit: Payer: Medicare Other | Admitting: Medical

## 2019-07-21 NOTE — Telephone Encounter (Signed)
Pt wife has called to inform the appointment can be cancelled but she does want to know if there is something else the pt can take since what he is currently on is no longer working.  Phone rep asked wife for the name of the medication, wife could not recall.  Phone rep offered to get the person that called her, wife declined.  Wife asking that another medication be called into [facility] and she will hear from them as to how to take it.  Wife is not asking for a call back from [place], she just wants another medication called in for pt. Phone rep advised wife the appointment will be cancelled

## 2019-07-21 NOTE — Telephone Encounter (Signed)
Please let her know that I do not know what medication she needs me to call in. If a refill of one that we write we can refill but I can not call in any new medications without visit. I do not have any other recommendations from a dementia standpoint. I recommend close follow up with psychiatry.

## 2019-07-21 NOTE — Telephone Encounter (Signed)
Christian [name] @ [employer] is asking for a call re: if the script from their office was received, it was faxed on 08-17.  Christian [name] stated she will refax again but would like a call from [employer]

## 2019-07-21 NOTE — Telephone Encounter (Signed)
Unable to get in contact with the patient. I left a voicemail asking him to return my call. Office number was provided. Patient was recently seen by Debbora Presto, NP on 07/07/2019 for his 6 mon f/u. I was calling to see if he was having any new issues/concerns. If not per Debbora Presto, NP it is ok for the appt to be cancelled.   If patient calls back please ask if he is having new issues or concerns. If not it is ok for the appt to be cancelled.

## 2019-07-21 NOTE — Telephone Encounter (Signed)
See previous notes on this.

## 2019-07-22 NOTE — Telephone Encounter (Signed)
Unable to get in contact with the patient due to phone constantly ringing. Unable to leave a voicemail. I will attempt to call back later.

## 2019-07-22 NOTE — Telephone Encounter (Signed)
Unable to get in contact with the patient due to the line being busy. I will try to contact him later today.

## 2019-07-23 ENCOUNTER — Other Ambulatory Visit: Payer: Self-pay | Admitting: Medical

## 2019-07-26 ENCOUNTER — Telehealth: Payer: Self-pay | Admitting: Medical

## 2019-07-26 MED ORDER — GLUCOSE BLOOD VI STRP: ORAL_STRIP | 12 refills | Status: DC

## 2019-07-26 NOTE — Telephone Encounter (Signed)
Pt wife called and states that pt needs strips for meter pt needs them to be called into [place], [place] - [address]

## 2019-07-26 NOTE — Telephone Encounter (Signed)
Test strips has been sent to the pharmacy ?

## 2019-07-26 NOTE — Telephone Encounter (Signed)
Unable to get in contact with the patient. I will attempt to call back later today.

## 2019-08-03 ENCOUNTER — Other Ambulatory Visit: Payer: Self-pay | Admitting: Neurology

## 2019-08-03 MED ORDER — MEMANTINE HCL ER 28 MG PO CP24: ORAL_CAPSULE | ORAL | 11 refills | Status: DC

## 2019-08-17 DIAGNOSIS — Z23 Encounter for immunization: Secondary | ICD-10-CM | POA: Diagnosis not present

## 2019-08-21 ENCOUNTER — Other Ambulatory Visit: Payer: Self-pay | Admitting: Medical

## 2019-08-23 NOTE — Telephone Encounter (Signed)
Is this appropriate?  

## 2019-08-27 ENCOUNTER — Other Ambulatory Visit: Payer: Self-pay | Admitting: Medical

## 2019-09-25 ENCOUNTER — Other Ambulatory Visit: Payer: Self-pay | Admitting: Medical

## 2019-10-01 DIAGNOSIS — Z23 Encounter for immunization: Secondary | ICD-10-CM | POA: Diagnosis not present

## 2019-10-05 DIAGNOSIS — N3941 Urge incontinence: Secondary | ICD-10-CM | POA: Diagnosis not present

## 2019-10-25 ENCOUNTER — Other Ambulatory Visit: Payer: Self-pay | Admitting: *Deleted

## 2019-10-25 DIAGNOSIS — I493 Ventricular premature depolarization: Secondary | ICD-10-CM

## 2019-10-25 MED ORDER — FLECAINIDE ACETATE 150 MG PO TABS: ORAL_TABLET | ORAL | 3 refills | Status: DC

## 2019-10-26 ENCOUNTER — Telehealth: Payer: Self-pay | Admitting: Medical

## 2019-10-26 ENCOUNTER — Telehealth: Payer: Self-pay | Admitting: Family Medicine

## 2019-10-26 NOTE — Telephone Encounter (Signed)
Pt wife called requesting a referral for a dermatology for a spot the size of a pea that she wants him to have removed, she wants him to go to [facility] dermatology and see Dr Elvera Lennox

## 2019-10-26 NOTE — Telephone Encounter (Signed)
Where is the lesion on the body?   Depending upon location, we may be able to biopsy or remove the lesion here.  Let me know.

## 2019-10-26 NOTE — Telephone Encounter (Addendum)
Patient wife called to report that the patients dementia is getting worse. She is unsure if he is needing to get a high dosage or what would be the next step. Patient wife states that he forgets if he has eaten and if he has taken his medicine and will only answer no to any questions. Please follow up.    Patient was scheduled next available  01/14 and added to the wait list.

## 2019-10-27 ENCOUNTER — Other Ambulatory Visit: Payer: Self-pay | Admitting: Medical

## 2019-10-27 DIAGNOSIS — L989 Disorder of the skin and subcutaneous tissue, unspecified: Secondary | ICD-10-CM

## 2019-10-27 NOTE — Telephone Encounter (Signed)
Appointment has been scheduled for med check here and patient has been referred to [facility]

## 2019-10-27 NOTE — Telephone Encounter (Signed)
Lesion behind ear. Please advise. Pt wife said it's the size of green pea and she doesn't think it's something you can take off.

## 2019-10-27 NOTE — Telephone Encounter (Signed)
That sounds fine, refer to derm as requested.   He is probably due for med check so schedule please.

## 2019-10-28 NOTE — Telephone Encounter (Signed)
I looked through the scanned in documents, I could not find the DPR.

## 2019-10-28 NOTE — Telephone Encounter (Signed)
I was in clinic when patient's wife called. I have attempted to return call but there is no answer. I previously advised to she contact PCP. Patient has significant mental health history. I am not able to assist with treatment of psychiatric concerns. She should contact 911 if there are any immediate concerns or threats to her life.
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
## 2019-10-28 NOTE — Telephone Encounter (Signed)
Pt wife has called back since she has not heard from Dr Krista Blue re: her message this morning.  Phone rep reached out to NP Amy and was advised to have wife  contact pt's PCP.  Wife stated that makes no sense and then ended call.

## 2019-10-28 NOTE — Telephone Encounter (Signed)
Please let wife know that we do not have any other options from a memory loss standpoint. I do not feel increase in Aricept or Namenda is appropriate. I am happy to send him for formal neurocognitive testing or psychiatry if they wish. Have them follow up closely with PCP as well for anxiety related concerns.

## 2019-10-28 NOTE — Telephone Encounter (Signed)
Pts wife called in and stated that she is agreeable to the neurocognitive testing or psychiatry.

## 2019-11-03 ENCOUNTER — Encounter: Payer: Medicare Other | Admitting: Medical

## 2019-11-04 NOTE — Telephone Encounter (Signed)
LMVM for wife of pt that returned call checking up on pt.  She is to return call at her conveniece.

## 2019-11-05 ENCOUNTER — Other Ambulatory Visit: Payer: Self-pay

## 2019-11-05 ENCOUNTER — Other Ambulatory Visit: Payer: Self-pay | Admitting: Medical

## 2019-11-05 MED ORDER — DILTIAZEM HCL 30 MG PO TABS: 30.0000 mg | ORAL_TABLET | Freq: Two times a day (BID) | ORAL | 0 refills | Status: DC

## 2019-11-05 NOTE — Telephone Encounter (Signed)
Is this appropriate?  

## 2019-11-05 NOTE — Telephone Encounter (Signed)
refilled diltiazem, needs fu apt

## 2019-11-09 ENCOUNTER — Encounter: Payer: Medicare Other | Admitting: Medical

## 2019-11-09 ENCOUNTER — Other Ambulatory Visit: Payer: Self-pay

## 2019-11-09 MED ORDER — GLUCOSE BLOOD VI STRP: ORAL_STRIP | 12 refills | Status: DC

## 2019-11-18 ENCOUNTER — Other Ambulatory Visit: Payer: Self-pay | Admitting: Medical

## 2019-11-27 ENCOUNTER — Other Ambulatory Visit: Payer: Self-pay | Admitting: Medical

## 2019-11-29 NOTE — Telephone Encounter (Signed)
Received fax from [facility] for a refill on tamsulosin 0.4mg  capsules last apt. 06/02/19.

## 2019-12-03 ENCOUNTER — Other Ambulatory Visit: Payer: Self-pay | Admitting: *Deleted

## 2019-12-03 MED ORDER — DILTIAZEM HCL 30 MG PO TABS: 30.0000 mg | ORAL_TABLET | Freq: Two times a day (BID) | ORAL | 0 refills | Status: DC

## 2019-12-09 ENCOUNTER — Other Ambulatory Visit: Payer: Self-pay | Admitting: Medical

## 2019-12-09 ENCOUNTER — Ambulatory Visit: Payer: Medicare Other | Admitting: Neurology

## 2019-12-09 NOTE — Telephone Encounter (Signed)
Lmom for patient to call and schedule appointment. 

## 2019-12-10 ENCOUNTER — Other Ambulatory Visit: Payer: Self-pay | Admitting: Medical

## 2019-12-18 ENCOUNTER — Other Ambulatory Visit: Payer: Self-pay | Admitting: Medical

## 2019-12-20 ENCOUNTER — Other Ambulatory Visit: Payer: Self-pay | Admitting: Medical

## 2020-01-03 ENCOUNTER — Other Ambulatory Visit: Payer: Self-pay | Admitting: Cardiology

## 2020-01-06 ENCOUNTER — Ambulatory Visit: Payer: Medicare Other | Admitting: Family Medicine

## 2020-01-08 ENCOUNTER — Other Ambulatory Visit: Payer: Self-pay | Admitting: Medical

## 2020-01-17 ENCOUNTER — Other Ambulatory Visit: Payer: Self-pay | Admitting: Medical

## 2020-01-17 ENCOUNTER — Telehealth: Payer: Self-pay

## 2020-01-17 MED ORDER — DONEPEZIL HCL 10 MG PO TABS: ORAL_TABLET | ORAL | 1 refills | Status: DC

## 2020-01-17 NOTE — Telephone Encounter (Signed)
New script has been sent to the patient's pharmacy  

## 2020-01-18 ENCOUNTER — Telehealth: Payer: Self-pay | Admitting: Neurology

## 2020-01-18 NOTE — Telephone Encounter (Signed)
Patient wife on DPR called stating that she would like to do a Telephone apt for tomorrows scheduled apt and  is upset with the past apts she has been coming in with the patient for. States at the last apt the patient fell asleep and NP only talked to her. And doesn't understand why she has to come in if she will be the one they talk to. Stated it is a Designer, fashion/clothing money and time" and wanted to know if anything will be done differently at the upcoming apt. She then asked about a petscan and when checking in with RN caller disconnected.

## 2020-01-18 NOTE — Telephone Encounter (Signed)
I prefer a person visit so that we can do detailed cognitive testing but if the family insist on a virtual telephonic visit I am okay with that

## 2020-01-18 NOTE — Telephone Encounter (Signed)
I tried to call pts wife but the listed preferred number was busy.

## 2020-01-18 NOTE — Telephone Encounter (Signed)
I relayed message to  [facility] that we can do a mychart video visit for pt and the wife decline. PEr phone room she did not have a phone with camera. She wanted to discuss PET scan. I advise phone room pt will need to be seen about a PET scan if needed. PT and wife are coming in tomorrow.

## 2020-01-19 ENCOUNTER — Ambulatory Visit: Payer: Medicare Other | Admitting: Neurology

## 2020-01-20 ENCOUNTER — Telehealth: Payer: Self-pay

## 2020-01-20 NOTE — Telephone Encounter (Signed)
Patient no show for appt on 01/19/2020.

## 2020-01-25 ENCOUNTER — Encounter: Payer: Self-pay | Admitting: Neurology

## 2020-01-28 ENCOUNTER — Encounter: Payer: Medicare Other | Admitting: Medical

## 2020-01-30 ENCOUNTER — Other Ambulatory Visit: Payer: Self-pay | Admitting: Medical

## 2020-02-02 ENCOUNTER — Telehealth: Payer: Self-pay | Admitting: Medical

## 2020-02-02 NOTE — Telephone Encounter (Signed)
Im ok with this.  Are they wanting referral for update physical therapy?

## 2020-02-02 NOTE — Telephone Encounter (Signed)
Pt wife called and wants to know if you will ok pt to go rehab, states he does not do anything, states he only eats and sleeps and watches tv, she can be reached at [phone]

## 2020-02-04 NOTE — Telephone Encounter (Signed)
Spoke to wife and she stated patient is just sitting around and not using his muscles and she thinks he needs to be in rehab. They are closed today so she will call and see if he needs and appointment however, it appears several of his appointments were canceled with rehab in the past.

## 2020-02-04 NOTE — Telephone Encounter (Signed)
Lmom for patient wife to return call to office concerning referral.

## 2020-02-04 NOTE — Telephone Encounter (Signed)
Pt wife called back and pt needs to go to the [place] out[facility] rehab center of [facility]

## 2020-02-07 ENCOUNTER — Other Ambulatory Visit: Payer: Self-pay | Admitting: Medical

## 2020-02-07 NOTE — Telephone Encounter (Signed)
Lmom for patient wife to call back for message from provider.

## 2020-02-07 NOTE — Telephone Encounter (Signed)
If he canceled or no showed appointments, I don't know that they will move forward with updated referral.  I recommend he have 1 appt with them and then just encourage him to do these home exercises daily.   Realistically he should be exercising with some walking, calisthenics, and some weight bearing exercise daily.   This is good for him, but I can't force him to do anything.  Lets get him and wife back in for med check (of medicare well visit if its time).  I appreciate them understanding about [place] having to reschedule them last week due my family emergency.

## 2020-02-08 ENCOUNTER — Other Ambulatory Visit: Payer: Self-pay

## 2020-02-08 DIAGNOSIS — R269 Unspecified abnormalities of gait and mobility: Secondary | ICD-10-CM

## 2020-02-08 DIAGNOSIS — R29898 Other symptoms and signs involving the musculoskeletal system: Secondary | ICD-10-CM

## 2020-02-08 NOTE — Telephone Encounter (Signed)
Spoke with rehab center in [place] and referral requested by wife has been sent.

## 2020-02-14 DIAGNOSIS — Z23 Encounter for immunization: Secondary | ICD-10-CM | POA: Diagnosis not present

## 2020-02-16 ENCOUNTER — Encounter: Payer: Medicare Other | Admitting: Medical

## 2020-02-17 ENCOUNTER — Encounter: Payer: Self-pay | Admitting: Physical Therapy

## 2020-02-17 ENCOUNTER — Ambulatory Visit: Payer: Medicare Other | Attending: Medical | Admitting: Physical Therapy

## 2020-02-17 ENCOUNTER — Other Ambulatory Visit: Payer: Self-pay

## 2020-02-17 DIAGNOSIS — R2681 Unsteadiness on feet: Secondary | ICD-10-CM | POA: Insufficient documentation

## 2020-02-17 DIAGNOSIS — M6281 Muscle weakness (generalized): Secondary | ICD-10-CM | POA: Diagnosis not present

## 2020-02-17 DIAGNOSIS — R296 Repeated falls: Secondary | ICD-10-CM

## 2020-02-17 NOTE — Therapy (Signed)
[facility] [place], [place], [address] Phone: [phone]   Fax:  [phone]  Physical Therapy Evaluation  Patient Details  Name: Christian [name] MRN: [id number] Date of Birth: 06/24/1941 Referring Provider (PT): Chana Bode, PA-C   Encounter Date: 02/17/2020  PT End of Session - 02/17/20 1401    Visit Number  1    Number of Visits  12    Date for PT Re-Evaluation  04/06/20    Authorization Type  Progress note every 10th visit, KX modifier at 15th visit    PT Start Time  1300    PT Stop Time  1345    PT Time Calculation (min)  45 min    Equipment Utilized During Treatment  Gait belt    Activity Tolerance  Patient tolerated treatment well    Behavior During Therapy  [place] for tasks assessed/performed       Past Medical History:  Diagnosis Date  . Agitation   . Arthritis   . Cataract   . Chronic back pain   . Confusion   . Diabetes ([place])    type 2  . DVT (deep venous thrombosis) ([place]) 01/2015  . Hearing impaired   . High cholesterol   . Hypertension   . Insomnia   . Insomnia   . Leg pain, diffuse   . Leg swelling   . Memory loss   . Mild cognitive impairment    sees [facility]  . Numbness    fingers, feet, toes  . OSA on CPAP   . 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 06/14/2015  . Pulmonary embolism ([place]) 01/2015  . Spinal stenosis    lumbar  . Tremor    on propranolol  . Wears glasses     Past Surgical History:  Procedure Laterality Date  . CATARACT EXTRACTION    . COLONOSCOPY  2014  . TONSILLECTOMY      There were no vitals filed for this visit.   Subjective Assessment - 02/17/20 1423    Subjective  COVID-19 screening performed upon arrival.Patient arrives to physical therapy with reports of frequent falls and LE weakness that has progressed over the past 3+ years. Patient's wife requires assistance to help him to stand from a fall  and reported it took 3 to 4 people to assist him to standing from his last fall. Patient reports "being lazy" and typically spends ~12 hours sleeping then the remainder sitting with very little physical activity out side of walking to one room to another. Patient requires increased time to perform ADLs, transfers, and bed mobility. Patient's wife reports home is set up with DME and reports having a cane but does not frequently use it. Patient's goal is to improve movement.    Patient is accompained by:  Family member   Wife   Pertinent History  DM, Neuropathy, early onset dementia, possible stroke    Limitations  Standing;Walking;House hold activities    Diagnostic tests  n/a    Patient Stated Goals  improve movement         [facility] PT Assessment - 02/17/20 0001      Assessment   Medical Diagnosis  Weakness of both lower extermities; gait abnormality    Referring Provider (PT)  Chana Bode, PA-C    Onset Date/Surgical Date  --   ongoing for 3+ years   Next MD Visit  March 14, 2020    Prior Therapy  yes  Precautions   Precautions  Fall    Precaution Comments  does not use AD      Restrictions   Weight Bearing Restrictions  No      Balance Screen   Has the patient fallen in the past 6 months  Yes    How many times?  3    Has the patient had a decrease in activity level because of a fear of falling?   Yes    Is the patient reluctant to leave their home because of a fear of falling?   Yes      [place]  Private residence    Living Arrangements  Spouse/significant other    Type of [place] to enter    Entrance Stairs-Number of Steps  1      Prior Function   Level of Independence  Needs assistance with homemaking;Needs assistance with ADLs      Posture/Postural Control   Posture/Postural Control  Postural limitations    Postural Limitations  Rounded Shoulders;Forward head;Flexed trunk      ROM / Strength   AROM /  PROM / Strength  Strength      Strength   Strength Assessment Site  Hip;Knee;Ankle    Right/Left Hip  Right;Left    Right Hip Flexion  3-/5    Right Hip ABduction  3+/5   bilaterally in sitting   Right Hip ADduction  3+/5   bilaterally in sitting   Left Hip Flexion  3-/5    Left Hip ABduction  3+/5   bilaterally in sitting   Left Hip ADduction  3+/5   bilaterally in sitting   Right/Left Knee  Right;Left    Right Knee Flexion  4-/5    Right Knee Extension  4/5    Left Knee Flexion  4-/5    Left Knee Extension  4/5    Right/Left Ankle  Right;Left    Right Ankle Dorsiflexion  4-/5    Right Ankle Inversion  3+/5    Right Ankle Eversion  3+/5    Left Ankle Dorsiflexion  4-/5    Left Ankle Inversion  3+/5    Left Ankle Eversion  3+/5      Transfers   Five time sit to stand comments   modified with UE support 41 seconds; standard chair    Comments  slow transitional movements; use of back of legs for stability      Ambulation/Gait   Gait Pattern  Step-through pattern;Decreased stride length;Decreased step length - right;Decreased step length - left;Decreased dorsiflexion - left;Decreased dorsiflexion - right;Shuffle;Decreased trunk rotation;Trunk flexed;Wide base of support;Poor foot clearance - left;Poor foot clearance - right    Ambulation Surface  Level;Unlevel      Balance   Balance Assessed  Yes      Static Standing Balance   Static Standing - Balance Support  No upper extremity supported    Static Standing - Level of Assistance  5: Stand by assistance    Static Standing Balance -  Activities   Romberg - Eyes Opened;Romberg - Eyes Closed    Static Standing - Comment/# of Minutes  2 mins WBOS; 1 min NBOS; requires UE assistance to attain position      Standardized Balance Assessment   Standardized Balance Assessment  Berg Balance Test      Berg Balance Test   Sit to Stand  Able to stand using hands after several tries  Standing Unsupported  Able to stand 2 minutes  with supervision    Sitting with Back Unsupported but Feet Supported on Floor or Stool  Able to sit safely and securely 2 minutes    Stand to Sit  Uses backs of legs against chair to control descent    Transfers  Able to transfer with verbal cueing and /or supervision    Standing Unsupported with Eyes Closed  Able to stand 10 seconds with supervision    Standing Unsupported with Feet Together  Needs help to attain position but able to stand for 30 seconds with feet together                Objective measurements completed on examination: See above findings.              PT Education - 02/17/20 1401    Education Details  draw in, heel/toe raises, hip abduction, hip adduction    Person(s) Educated  Patient;Spouse    Methods  Explanation;Demonstration;Handout    Comprehension  Verbalized understanding;Returned demonstration;Verbal cues required          PT Long Term Goals - 02/17/20 1407      PT LONG TERM GOAL #1   Title  Patient will be modified independent with HEP with assistance from wife.    Time  6    Period  Weeks    Status  New      PT LONG TERM GOAL #2   Title  Patient will demonstrate 4+/5 or greater bilateral LE MMT to improve stability during functional tasks.    Time  6    Status  New      PT LONG TERM GOAL #3   Title  Patient will perform nustep at level 4 or greater with 65+ steps per minute for 15 minutes to improve cardiovascular health and muscular endurance.    Time  6    Period  Weeks    Status  New      PT LONG TERM GOAL #4   Title  Patient will functional LE strength and decrease fall risk as noted by the ability to perform modified 5x sit to stand with UE support in 30 seconds or less.      PT LONG TERM GOAL #5   Title  Patient will decrease risk of falls as noted by a 5 point or greater improvement of Berg Balance Scale.    Time  6    Period  Weeks    Status  New             Plan - 02/17/20 1402    Clinical Impression  Statement  Patient is a 79 year old male who presents to physical therapy with physical deconditioning and increased fall risk that has progressed over 3+ years. Patient's modified 5x sit to stand time of 41 seconds categorizes him as a fall risk with decreased functional LE strength. Patient requires assistance to attain WBOS balance and NBOS standing. Patient and PT discussed at length the importance physical activity for generalized health and wellness as well as the importance of performing HEP at home for maximum PT benefit. Patient provided with HEP and suggested to perform certain exercises every time a commercial comes on the television. Patient reported understanding. Patient would benefit from skilled physical therapy to address deficits and address patient's goals.    Personal Factors and Comorbidities  Age;Behavior Pattern;Time since onset of injury/illness/exacerbation;Comorbidity 2    Comorbidities  DM, Neuropathy, early onset  dementia, possible stroke    Examination-Activity Limitations  Bathing;Bed Mobility;Locomotion Level;Transfers;Stand;Toileting;Hygiene/Grooming    Stability/Clinical Decision Making  Evolving/Moderate complexity    Clinical Decision Making  Low    Rehab Potential  Poor    PT Frequency  2x / week    PT Duration  6 weeks    PT Treatment/Interventions  ADLs/Self Care Home Management;Stair training;Gait training;Functional mobility training;Therapeutic activities;Therapeutic exercise;Balance training;Neuromuscular re-education;Manual techniques;Energy conservation;Patient/family education;Passive range of motion    PT Next Visit Plan  Complete Berg Balance Scale, Nustep with 60+ steps per min for cardio endurance, general strengthening, Core stabilization    PT Home Exercise Plan  see patient education section    Consulted and Agree with Plan of Care  Patient       Patient will benefit from skilled therapeutic intervention in order to improve the following deficits  and impairments:  Abnormal gait, Decreased activity tolerance, Decreased balance, Decreased strength, Difficulty walking, Decreased safety awareness, Impaired perceived functional ability, Decreased mobility  Visit Diagnosis: Repeated falls - Plan: PT plan of care cert/re-cert  Unsteadiness on feet - Plan: PT plan of care cert/re-cert  Muscle weakness (generalized) - Plan: PT plan of care cert/re-cert     Problem List Patient Active Problem List   Diagnosis Date Noted  . Benign prostatic hyperplasia with lower urinary tract symptoms 06/04/2019  . Urge incontinence 06/04/2019  . History of stroke 06/02/2019  . Medicare annual wellness visit, subsequent 06/02/2019  . Urinary incontinence 10/28/2018  . Polyuria 10/28/2018  . Dermatitis 10/28/2018  . Tinea cruris 03/25/2018  . Tick bite 03/25/2018  . Balanitis 03/25/2018  . At moderate risk for fall 12/31/2017  . Alcohol abuse 12/29/2017  . History of pulmonary embolism 12/29/2017  . Depression, recurrent ([place]) 12/29/2017  . Stroke ([place]) 12/16/2017  . Alcohol intoxication ([place]) 12/16/2017  . Wears hearing aid 06/09/2017  . Tinnitus of both ears 06/09/2017  . Bilateral impacted cerumen 06/09/2017  . Left wrist pain 06/26/2016  . Morning joint stiffness 06/26/2016  . Polyarthralgia 06/26/2016  . Gait disturbance 04/02/2016  . Physical deconditioning 04/02/2016  . Pain of both shoulder joints 04/02/2016  . Leg weakness, bilateral 04/02/2016  . Encounter for therapeutic drug monitoring 11/29/2015  . Encounter for health maintenance examination in adult 10/30/2015  . Shoulder pain, bilateral 10/30/2015  . Muscle weakness 10/30/2015  . Generalized weakness 10/30/2015  . Rash 10/30/2015  . PVC (premature ventricular contraction) 10/10/2015  . Decreased energy 06/20/2015  . DVT (deep venous thrombosis) ([place]) 06/20/2015  . Hammer toe of left foot 06/20/2015  . Pre-ulcerative calluses 06/20/2015  . Hypertrophic toenail  06/20/2015  . Diabetic mononeuropathy associated with diabetes mellitus due to underlying condition ([place]) 06/20/2015  . Leg pain, bilateral 06/20/2015  . Chronic low back pain 06/20/2015  . 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 06/14/2015  . OSA on CPAP 03/06/2015  . Edema 03/06/2015  . Leg pain, diffuse 03/06/2015  . Bilateral low back pain without sciatica 03/06/2015  . Tremor 03/06/2015  . Insomnia 03/06/2015  . Vaccine counseling 03/06/2015  . Chronic back pain 03/06/2015  . Wears glasses 03/06/2015  . Hearing loss 03/06/2015  . Spinal stenosis of lumbar region 03/06/2015  . Ptosis 03/06/2015  . Positive depression screening 03/06/2015  . Advance directive discussed with patient 03/06/2015  . Bradycardia 02/27/2015  . Current use of long term anticoagulation   . Dementia without behavioral disturbance ([place])   . Essential hypertension 02/08/2015  . Hyperlipidemia  02/08/2015  . SOB (shortness of breath) 02/08/2015  . OSA (obstructive sleep apnea) 02/08/2015  . PE (pulmonary embolism) 01/31/2015  . Memory loss 03/12/2013  . Diabetes mellitus ([place]) 03/12/2013    Gabriela Eves, PT, DPT 02/17/2020, 6:03 PM  [facility] [address] [place], [place], [address] Phone: [phone]   Fax:  [phone]  Name: BRAINARD REGES MRN: [id number] Date of Birth: Nov 22, 1941

## 2020-02-22 ENCOUNTER — Other Ambulatory Visit: Payer: Self-pay

## 2020-02-22 ENCOUNTER — Ambulatory Visit: Payer: Medicare Other | Admitting: *Deleted

## 2020-02-22 DIAGNOSIS — M6281 Muscle weakness (generalized): Secondary | ICD-10-CM | POA: Diagnosis not present

## 2020-02-22 DIAGNOSIS — R296 Repeated falls: Secondary | ICD-10-CM | POA: Diagnosis not present

## 2020-02-22 DIAGNOSIS — R2681 Unsteadiness on feet: Secondary | ICD-10-CM

## 2020-02-22 NOTE — Therapy (Signed)
[name], [place], [address] Phone: [phone]   Fax:  [phone]  Physical Therapy Treatment  Patient Details  Name: Christian [name] MRN: [id number] Date of Birth: 1940/11/29 Referring Provider (PT): Chana Bode, PA-C   Encounter Date: 02/22/2020  PT End of Session - 02/22/20 1404    Visit Number  2    Number of Visits  12    Date for PT Re-Evaluation  04/06/20    Authorization Type  Progress note every 10th visit, KX modifier at 15th visit    PT Start Time  1345    PT Stop Time  1430    PT Time Calculation (min)  45 min       Past Medical History:  Diagnosis Date  . Agitation   . Arthritis   . Cataract   . Chronic back pain   . Confusion   . Diabetes ([place])    type 2  . DVT (deep venous thrombosis) ([place]) 01/2015  . Hearing impaired   . High cholesterol   . Hypertension   . Insomnia   . Insomnia   . Leg pain, diffuse   . Leg swelling   . Memory loss   . Mild cognitive impairment    sees [facility]  . Numbness    fingers, feet, toes  . OSA on CPAP   . 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 06/14/2015  . Pulmonary embolism ([place]) 01/2015  . Spinal stenosis    lumbar  . Tremor    on propranolol  . Wears glasses     Past Surgical History:  Procedure Laterality Date  . CATARACT EXTRACTION    . COLONOSCOPY  2014  . TONSILLECTOMY      There were no vitals filed for this visit.  Subjective Assessment - 02/22/20 1346    Subjective  COVID-19 screening performed upon arrival. Doing [place]    Patient is accompained by:  Family member    Pertinent History  DM, Neuropathy, early onset dementia, possible stroke    Limitations  Standing;Walking;House hold activities    Patient Stated Goals  improve movement         [facility] PT Assessment - 02/22/20 0001      Standardized Balance Assessment   Standardized Balance Assessment  Berg Balance  Test      Berg Balance Test   Sit to Stand  Able to stand using hands after several tries    Standing Unsupported  Able to stand 2 minutes with supervision    Sitting with Back Unsupported but Feet Supported on Floor or Stool  Able to sit safely and securely 2 minutes    Stand to Sit  Uses backs of legs against chair to control descent    Transfers  Able to transfer with verbal cueing and /or supervision    Standing Unsupported with Eyes Closed  Able to stand 10 seconds with supervision    Standing Unsupported with Feet Together  Needs help to attain position but able to stand for 30 seconds with feet together    From Standing, Reach Forward with Outstretched Arm  Reaches forward but needs supervision    From Standing Position, Pick up Object from Floor  Unable to try/needs assist to keep balance    From Standing Position, Turn to Look Behind Over each Shoulder  Turn sideways only but maintains balance    Turn 360 Degrees  Needs close supervision or  verbal cueing   23 secs   Standing Unsupported, Alternately Place Feet on Step/Stool  Able to complete 4 steps without aid or supervision    Standing Unsupported, One Foot in Front  Needs help to step but can hold 15 seconds    Standing on One Leg  Tries to lift leg/unable to hold 3 seconds but remains standing independently    Total Score  25    Berg comment:  each ex practiced several times for Rx                   [facility] Adult PT Treatment/Exercise - 02/22/20 0001      Exercises   Exercises  Knee/Hip;Lumbar      Lumbar Exercises: Aerobic   Nustep  Nustep L4 x 15 mins (SPM  not working)      Lumbar Exercises: Seated   Long Arc Quad on Chair  Both;3 sets;10 reps    LAQ on [employer] (lbs)  2      Knee/Hip Exercises: Standing   Forward Step Up  Both;2 sets;10 reps    Other Standing Knee Exercises  6in toe taps 2x10                  PT Long Term Goals - 02/17/20 1407      PT LONG TERM GOAL #1   Title   Patient will be modified independent with HEP with assistance from wife.    Time  6    Period  Weeks    Status  New      PT LONG TERM GOAL #2   Title  Patient will demonstrate 4+/5 or greater bilateral LE MMT to improve stability during functional tasks.    Time  6    Status  New      PT LONG TERM GOAL #3   Title  Patient will perform nustep at level 4 or greater with 65+ steps per minute for 15 minutes to improve cardiovascular health and muscular endurance.    Time  6    Period  Weeks    Status  New      PT LONG TERM GOAL #4   Title  Patient will functional LE strength and decrease fall risk as noted by the ability to perform modified 5x sit to stand with UE support in 30 seconds or less.      PT LONG TERM GOAL #5   Title  Patient will decrease risk of falls as noted by a 5 point or greater improvement of Berg Balance Scale.    Time  6    Period  Weeks    Status  New            Plan - 02/22/20 1404    Clinical Impression Statement  Pt arrived today ambulating with SPC and a somewhat shuffling slow gait. He was guided through the [facility] test and was very challenged by most of the act's. Supervision at all times with standing exs is needed. Pt very fatigued after session and needs supervision and assistance when leaving facility.    Personal Factors and Comorbidities  Age;Behavior Pattern;Time since onset of injury/illness/exacerbation;Comorbidity 2    Comorbidities  DM, Neuropathy, early onset dementia, possible stroke    Examination-Activity Limitations  Bathing;Bed Mobility;Locomotion Level;Transfers;Stand;Toileting;Hygiene/Grooming    Stability/Clinical Decision Making  Evolving/Moderate complexity    Rehab Potential  Poor    PT Frequency  2x / week    PT Duration  6 weeks  PT Treatment/Interventions  ADLs/Self Care Home Management;Stair training;Gait training;Functional mobility training;Therapeutic activities;Therapeutic exercise;Balance training;Neuromuscular  re-education;Manual techniques;Energy conservation;Patient/family education;Passive range of motion    PT Next Visit Plan  Complete Berg Balance Scale, Nustep with 60+ steps per min for cardio endurance, general strengthening, Core stabilization    PT Home Exercise Plan  see patient education section    Consulted and Agree with Plan of Care  Patient       Patient will benefit from skilled therapeutic intervention in order to improve the following deficits and impairments:  Abnormal gait, Decreased activity tolerance, Decreased balance, Decreased strength, Difficulty walking, Decreased safety awareness, Impaired perceived functional ability, Decreased mobility  Visit Diagnosis: Repeated falls  Unsteadiness on feet  Muscle weakness (generalized)     Problem List Patient Active Problem List   Diagnosis Date Noted  . Benign prostatic hyperplasia with lower urinary tract symptoms 06/04/2019  . Urge incontinence 06/04/2019  . History of stroke 06/02/2019  . Medicare annual wellness visit, subsequent 06/02/2019  . Urinary incontinence 10/28/2018  . Polyuria 10/28/2018  . Dermatitis 10/28/2018  . Tinea cruris 03/25/2018  . Tick bite 03/25/2018  . Balanitis 03/25/2018  . At moderate risk for fall 12/31/2017  . Alcohol abuse 12/29/2017  . History of pulmonary embolism 12/29/2017  . Depression, recurrent ([place]) 12/29/2017  . Stroke ([place]) 12/16/2017  . Alcohol intoxication ([place]) 12/16/2017  . Wears hearing aid 06/09/2017  . Tinnitus of both ears 06/09/2017  . Bilateral impacted cerumen 06/09/2017  . Left wrist pain 06/26/2016  . Morning joint stiffness 06/26/2016  . Polyarthralgia 06/26/2016  . Gait disturbance 04/02/2016  . Physical deconditioning 04/02/2016  . Pain of both shoulder joints 04/02/2016  . Leg weakness, bilateral 04/02/2016  . Encounter for therapeutic drug monitoring 11/29/2015  . Encounter for health maintenance examination in adult 10/30/2015  . Shoulder pain,  bilateral 10/30/2015  . Muscle weakness 10/30/2015  . Generalized weakness 10/30/2015  . Rash 10/30/2015  . PVC (premature ventricular contraction) 10/10/2015  . Decreased energy 06/20/2015  . DVT (deep venous thrombosis) ([place]) 06/20/2015  . Hammer toe of left foot 06/20/2015  . Pre-ulcerative calluses 06/20/2015  . Hypertrophic toenail 06/20/2015  . Diabetic mononeuropathy associated with diabetes mellitus due to underlying condition ([place]) 06/20/2015  . Leg pain, bilateral 06/20/2015  . Chronic low back pain 06/20/2015  . 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 06/14/2015  . OSA on CPAP 03/06/2015  . Edema 03/06/2015  . Leg pain, diffuse 03/06/2015  . Bilateral low back pain without sciatica 03/06/2015  . Tremor 03/06/2015  . Insomnia 03/06/2015  . Vaccine counseling 03/06/2015  . Chronic back pain 03/06/2015  . Wears glasses 03/06/2015  . Hearing loss 03/06/2015  . Spinal stenosis of lumbar region 03/06/2015  . Ptosis 03/06/2015  . Positive depression screening 03/06/2015  . Advance directive discussed with patient 03/06/2015  . Bradycardia 02/27/2015  . Current use of long term anticoagulation   . Dementia without behavioral disturbance ([place])   . Essential hypertension 02/08/2015  . Hyperlipidemia 02/08/2015  . SOB (shortness of breath) 02/08/2015  . OSA (obstructive sleep apnea) 02/08/2015  . PE (pulmonary embolism) 01/31/2015  . Memory loss 03/12/2013  . Diabetes mellitus ([place]) 03/12/2013    Kimble Delaurentis,CHRIS, PTA 02/22/2020, 6:15 PM  [facility] [address] [place], [place], [address] Phone: [phone]   Fax:  [phone]  Name: RYCE VARDEMAN MRN: [id number] Date of Birth: 10/02/1941

## 2020-02-24 ENCOUNTER — Ambulatory Visit: Payer: Medicare Other | Attending: Medical | Admitting: *Deleted

## 2020-02-24 ENCOUNTER — Other Ambulatory Visit: Payer: Self-pay

## 2020-02-24 DIAGNOSIS — R2681 Unsteadiness on feet: Secondary | ICD-10-CM | POA: Diagnosis not present

## 2020-02-24 DIAGNOSIS — R296 Repeated falls: Secondary | ICD-10-CM | POA: Insufficient documentation

## 2020-02-24 DIAGNOSIS — M6281 Muscle weakness (generalized): Secondary | ICD-10-CM | POA: Diagnosis not present

## 2020-02-24 NOTE — Therapy (Signed)
[facility] [place], [place], [address] Phone: [phone]   Fax:  [phone]  Physical Therapy Treatment  Patient Details  Name: Christian [name] MRN: [id number] Date of Birth: 1941-06-07 Referring Provider (PT): Chana Bode, PA-C   Encounter Date: 02/24/2020  PT End of Session - 02/24/20 1753    Visit Number  3    Number of Visits  12    Date for PT Re-Evaluation  04/06/20    Authorization Type  Progress note every 10th visit, KX modifier at 15th visit    PT Start Time  1345    PT Stop Time  1435    PT Time Calculation (min)  50 min       Past Medical History:  Diagnosis Date  . Agitation   . Arthritis   . Cataract   . Chronic back pain   . Confusion   . Diabetes ([place])    type 2  . DVT (deep venous thrombosis) ([place]) 01/2015  . Hearing impaired   . High cholesterol   . Hypertension   . Insomnia   . Insomnia   . Leg pain, diffuse   . Leg swelling   . Memory loss   . Mild cognitive impairment    sees [facility]  . Numbness    fingers, feet, toes  . OSA on CPAP   . 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 06/14/2015  . Pulmonary embolism ([place]) 01/2015  . Spinal stenosis    lumbar  . Tremor    on propranolol  . Wears glasses     Past Surgical History:  Procedure Laterality Date  . CATARACT EXTRACTION    . COLONOSCOPY  2014  . TONSILLECTOMY      There were no vitals filed for this visit.                    [place] Adult PT Treatment/Exercise - 02/24/20 0001      Ambulation/Gait   Ambulation/Gait  Yes    Assistive device  Straight cane    Gait Pattern  Step-through pattern;Decreased stride length;Decreased step length - right;Decreased step length - left;Decreased dorsiflexion - left;Decreased dorsiflexion - right;Shuffle;Decreased trunk rotation;Trunk flexed;Wide base of support;Poor foot clearance - left;Poor foot clearance  - right;Step-to pattern    Ambulation Surface  Level;Unlevel;Outdoor    Ramp  5: Supervision      Exercises   Exercises  Knee/Hip;Lumbar      Lumbar Exercises: Aerobic   Nustep  Nustep L4 x 15 mins (SPM  not working)      Lumbar Exercises: Seated   Long Arc Quad on Chair  Both;3 sets;10 reps    LAQ on [employer] (lbs)  2    Sit to [employer]	[place]  10 reps   hold balance at top     Knee/Hip Exercises: Standing   Other Standing Knee Exercises  6in toe taps 3x10   with CGA/ SBA   Other Standing Knee Exercises  Balance: one step holds x 5 each foot forward, Static balance with cognaitve challenge pointing at numbers on the wall x 3 bouts                  PT Long Term Goals - 02/17/20 1407      PT LONG TERM GOAL #1   Title  Patient will be modified independent with HEP with assistance from wife.    Time  6  Period  Weeks    Status  New      PT LONG TERM GOAL #2   Title  Patient will demonstrate 4+/5 or greater bilateral LE MMT to improve stability during functional tasks.    Time  6    Status  New      PT LONG TERM GOAL #3   Title  Patient will perform nustep at level 4 or greater with 65+ steps per minute for 15 minutes to improve cardiovascular health and muscular endurance.    Time  6    Period  Weeks    Status  New      PT LONG TERM GOAL #4   Title  Patient will functional LE strength and decrease fall risk as noted by the ability to perform modified 5x sit to stand with UE support in 30 seconds or less.      PT LONG TERM GOAL #5   Title  Patient will decrease risk of falls as noted by a 5 point or greater improvement of Berg Balance Scale.    Time  6    Period  Weeks    Status  New            Plan - 02/24/20 1754    Clinical Impression Statement  Pt arrived today doing ok. He was guided through Balance act.'s as well as LE strengthening exs with short rest periods as needed due to fatigue. Pt needs supervision at all times due to balance deficits.  Pt  does fairly well with step through gait pattern and WBOS when on level ground, but slows down to shuffling gait pattern if there is any changes in direction or surfaces.    Personal Factors and Comorbidities  Age;Behavior Pattern;Time since onset of injury/illness/exacerbation;Comorbidity 2    Comorbidities  DM, Neuropathy, early onset dementia, possible stroke    Examination-Activity Limitations  Bathing;Bed Mobility;Locomotion Level;Transfers;Stand;Toileting;Hygiene/Grooming    Stability/Clinical Decision Making  Evolving/Moderate complexity    Rehab Potential  Poor    PT Frequency  2x / week    PT Duration  6 weeks    PT Treatment/Interventions  ADLs/Self Care Home Management;Stair training;Gait training;Functional mobility training;Therapeutic activities;Therapeutic exercise;Balance training;Neuromuscular re-education;Manual techniques;Energy conservation;Patient/family education;Passive range of motion    PT Next Visit Plan  Nustep with 60+ steps per min for cardio endurance, general strengthening, Core stabilization    PT Home Exercise Plan  see patient education section    Consulted and Agree with Plan of Care  Patient       Patient will benefit from skilled therapeutic intervention in order to improve the following deficits and impairments:  Abnormal gait, Decreased activity tolerance, Decreased balance, Decreased strength, Difficulty walking, Decreased safety awareness, Impaired perceived functional ability, Decreased mobility  Visit Diagnosis: Repeated falls  Unsteadiness on feet  Muscle weakness (generalized)     Problem List Patient Active Problem List   Diagnosis Date Noted  . Benign prostatic hyperplasia with lower urinary tract symptoms 06/04/2019  . Urge incontinence 06/04/2019  . History of stroke 06/02/2019  . Medicare annual wellness visit, subsequent 06/02/2019  . Urinary incontinence 10/28/2018  . Polyuria 10/28/2018  . Dermatitis 10/28/2018  . Tinea cruris  03/25/2018  . Tick bite 03/25/2018  . Balanitis 03/25/2018  . At moderate risk for fall 12/31/2017  . Alcohol abuse 12/29/2017  . History of pulmonary embolism 12/29/2017  . Depression, recurrent ([place]) 12/29/2017  . Stroke ([place]) 12/16/2017  . Alcohol intoxication ([place]) 12/16/2017  . Wears hearing aid  06/09/2017  . Tinnitus of both ears 06/09/2017  . Bilateral impacted cerumen 06/09/2017  . Left wrist pain 06/26/2016  . Morning joint stiffness 06/26/2016  . Polyarthralgia 06/26/2016  . Gait disturbance 04/02/2016  . Physical deconditioning 04/02/2016  . Pain of both shoulder joints 04/02/2016  . Leg weakness, bilateral 04/02/2016  . Encounter for therapeutic drug monitoring 11/29/2015  . Encounter for health maintenance examination in adult 10/30/2015  . Shoulder pain, bilateral 10/30/2015  . Muscle weakness 10/30/2015  . Generalized weakness 10/30/2015  . Rash 10/30/2015  . PVC (premature ventricular contraction) 10/10/2015  . Decreased energy 06/20/2015  . DVT (deep venous thrombosis) ([place]) 06/20/2015  . Hammer toe of left foot 06/20/2015  . Pre-ulcerative calluses 06/20/2015  . Hypertrophic toenail 06/20/2015  . Diabetic mononeuropathy associated with diabetes mellitus due to underlying condition ([place]) 06/20/2015  . Leg pain, bilateral 06/20/2015  . Chronic low back pain 06/20/2015  . 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 06/14/2015  . OSA on CPAP 03/06/2015  . Edema 03/06/2015  . Leg pain, diffuse 03/06/2015  . Bilateral low back pain without sciatica 03/06/2015  . Tremor 03/06/2015  . Insomnia 03/06/2015  . Vaccine counseling 03/06/2015  . Chronic back pain 03/06/2015  . Wears glasses 03/06/2015  . Hearing loss 03/06/2015  . Spinal stenosis of lumbar region 03/06/2015  . Ptosis 03/06/2015  . Positive depression screening 03/06/2015  . Advance directive discussed with patient 03/06/2015  . Bradycardia 02/27/2015  .  Current use of long term anticoagulation   . Dementia without behavioral disturbance ([place])   . Essential hypertension 02/08/2015  . Hyperlipidemia 02/08/2015  . SOB (shortness of breath) 02/08/2015  . OSA (obstructive sleep apnea) 02/08/2015  . PE (pulmonary embolism) 01/31/2015  . Memory loss 03/12/2013  . Diabetes mellitus ([place]) 03/12/2013    Stark Aguinaga,CHRIS, PTA 02/24/2020, 6:05 PM  [facility] [address] [place], [place], [address] Phone: [phone]   Fax:  [phone]  Name: TAURIN GUILFOYLE MRN: [id number] Date of Birth: [birth year]

## 2020-02-29 ENCOUNTER — Other Ambulatory Visit: Payer: Self-pay

## 2020-02-29 ENCOUNTER — Ambulatory Visit: Payer: Medicare Other | Admitting: *Deleted

## 2020-02-29 DIAGNOSIS — S299XXA Unspecified injury of thorax, initial encounter: Secondary | ICD-10-CM | POA: Diagnosis not present

## 2020-02-29 DIAGNOSIS — Z03818 Encounter for observation for suspected exposure to other biological agents ruled out: Secondary | ICD-10-CM | POA: Diagnosis not present

## 2020-02-29 DIAGNOSIS — S72002A Fracture of unspecified part of neck of left femur, initial encounter for closed fracture: Secondary | ICD-10-CM | POA: Diagnosis not present

## 2020-02-29 DIAGNOSIS — S72142A Displaced intertrochanteric fracture of left femur, initial encounter for closed fracture: Secondary | ICD-10-CM | POA: Diagnosis not present

## 2020-02-29 DIAGNOSIS — R2681 Unsteadiness on feet: Secondary | ICD-10-CM

## 2020-02-29 DIAGNOSIS — R296 Repeated falls: Secondary | ICD-10-CM

## 2020-02-29 DIAGNOSIS — S199XXA Unspecified injury of neck, initial encounter: Secondary | ICD-10-CM | POA: Diagnosis not present

## 2020-02-29 DIAGNOSIS — M6281 Muscle weakness (generalized): Secondary | ICD-10-CM

## 2020-02-29 DIAGNOSIS — I10 Essential (primary) hypertension: Secondary | ICD-10-CM | POA: Diagnosis not present

## 2020-02-29 DIAGNOSIS — S0990XA Unspecified injury of head, initial encounter: Secondary | ICD-10-CM | POA: Diagnosis not present

## 2020-02-29 NOTE — Therapy (Signed)
[facility] [place], [place], [address] Phone: [phone]   Fax:  [phone]  Physical Therapy Treatment  Patient Details  Name: Christian [name] MRN: [id number] Date of Birth: 06-28-1941 Referring Provider (PT): Chana Bode, PA-C   Encounter Date: 02/29/2020  PT End of Session - 02/29/20 1355    Visit Number  4    Number of Visits  12    Date for PT Re-Evaluation  04/06/20    Authorization Type  Progress note every 10th visit, KX modifier at 15th visit    PT Start Time  1354    PT Stop Time  1439    PT Time Calculation (min)  45 min       Past Medical History:  Diagnosis Date  . Agitation   . Arthritis   . Cataract   . Chronic back pain   . Confusion   . Diabetes ([place])    type 2  . DVT (deep venous thrombosis) ([place]) 01/2015  . Hearing impaired   . High cholesterol   . Hypertension   . Insomnia   . Insomnia   . Leg pain, diffuse   . Leg swelling   . Memory loss   . Mild cognitive impairment    sees [facility]  . Numbness    fingers, feet, toes  . OSA on CPAP   . 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 06/14/2015  . Pulmonary embolism ([place]) 01/2015  . Spinal stenosis    lumbar  . Tremor    on propranolol  . Wears glasses     Past Surgical History:  Procedure Laterality Date  . CATARACT EXTRACTION    . COLONOSCOPY  2014  . TONSILLECTOMY      There were no vitals filed for this visit.  Subjective Assessment - 02/29/20 1407    Subjective  COVID-19 screening performed upon arrival.  Did ok after last Rx    Pertinent History  DM, Neuropathy, early onset dementia, possible stroke    Limitations  Standing;Walking;House hold activities    Diagnostic tests  n/a    Patient Stated Goals  improve movement    Currently in Pain?  No/denies                       [facility] Adult PT Treatment/Exercise - 02/29/20 0001      Ambulation/Gait   Ambulation/Gait  Yes    Assistive device  Straight cane    Gait Pattern  Step-through pattern;Decreased stride length;Decreased step length - right;Decreased step length - left;Decreased dorsiflexion - left;Decreased dorsiflexion - right;Shuffle;Decreased trunk rotation;Trunk flexed;Wide base of support;Poor foot clearance - left;Poor foot clearance - right;Step-to pattern    Ambulation Surface  Level;Unlevel    Stairs  Yes    Ramp  5: Supervision      Exercises   Exercises  Knee/Hip;Lumbar      Lumbar Exercises: Aerobic   Nustep  Nustep L4 x 15 mins (SPM  not working)      Lumbar Exercises: Seated   Long Arc Quad on Chair  Both;10 reps;2 sets    LAQ on [employer] (lbs)  3    Sit to Stand  10 reps   hold balance at top     Knee/Hip Exercises: Standing   Other Standing Knee Exercises  6in toe taps 3x20   with CGA/ SBA and 1 UE contact   Other Standing Knee Exercises  Balance: one step holds x 5 each foot forward, Static balance with cognaitve challenge pointing at numbers on the wall x 2 boutson floor and then on airex.                  PT Long Term Goals - 02/17/20 1407      PT LONG TERM GOAL #1   Title  Patient will be modified independent with HEP with assistance from wife.    Time  6    Period  Weeks    Status  New      PT LONG TERM GOAL #2   Title  Patient will demonstrate 4+/5 or greater bilateral LE MMT to improve stability during functional tasks.    Time  6    Status  New      PT LONG TERM GOAL #3   Title  Patient will perform nustep at level 4 or greater with 65+ steps per minute for 15 minutes to improve cardiovascular health and muscular endurance.    Time  6    Period  Weeks    Status  New      PT LONG TERM GOAL #4   Title  Patient will functional LE strength and decrease fall risk as noted by the ability to perform modified 5x sit to stand with UE support in 30 seconds or less.      PT LONG TERM GOAL #5   Title  Patient  will decrease risk of falls as noted by a 5 point or greater improvement of Berg Balance Scale.    Time  6    Period  Weeks    Status  New            Plan - 02/29/20 1834    Clinical Impression Statement  Pt arrived with [facility] for ambulation and reports that he doesn't use it all the time. Pt was guided through balance and LE strengthening exs as well as practicing gait in clinic as well as outside. He is able to perform  step-through pattern on level ground with SPC, but starts to slow and shuffle his gait when having to turn or something changes. VCs and supervision needed at all times in clinic    Personal Factors and Comorbidities  Age;Behavior Pattern;Time since onset of injury/illness/exacerbation;Comorbidity 2    Comorbidities  DM, Neuropathy, early onset dementia, possible stroke    Examination-Activity Limitations  Bathing;Bed Mobility;Locomotion Level;Transfers;Stand;Toileting;Hygiene/Grooming    Stability/Clinical Decision Making  Evolving/Moderate complexity    Rehab Potential  Poor    PT Frequency  2x / week    PT Duration  6 weeks    PT Treatment/Interventions  ADLs/Self Care Home Management;Stair training;Gait training;Functional mobility training;Therapeutic activities;Therapeutic exercise;Balance training;Neuromuscular re-education;Manual techniques;Energy conservation;Patient/family education;Passive range of motion    PT Next Visit Plan  Nustep with 60+ steps per min for cardio endurance, general strengthening, Core stabilization    PT Home Exercise Plan  see patient education section    Consulted and Agree with Plan of Care  Patient       Patient will benefit from skilled therapeutic intervention in order to improve the following deficits and impairments:     Visit Diagnosis: Repeated falls  Unsteadiness on feet  Muscle weakness (generalized)     Problem List Patient Active Problem List   Diagnosis Date Noted  . Benign prostatic hyperplasia with lower  urinary tract symptoms 06/04/2019  . Urge incontinence 06/04/2019  . History of stroke 06/02/2019  . Medicare annual  wellness visit, subsequent 06/02/2019  . Urinary incontinence 10/28/2018  . Polyuria 10/28/2018  . Dermatitis 10/28/2018  . Tinea cruris 03/25/2018  . Tick bite 03/25/2018  . Balanitis 03/25/2018  . At moderate risk for fall 12/31/2017  . Alcohol abuse 12/29/2017  . History of pulmonary embolism 12/29/2017  . Depression, recurrent ([place]) 12/29/2017  . Stroke ([place]) 12/16/2017  . Alcohol intoxication ([place]) 12/16/2017  . Wears hearing aid 06/09/2017  . Tinnitus of both ears 06/09/2017  . Bilateral impacted cerumen 06/09/2017  . Left wrist pain 06/26/2016  . Morning joint stiffness 06/26/2016  . Polyarthralgia 06/26/2016  . Gait disturbance 04/02/2016  . Physical deconditioning 04/02/2016  . Pain of both shoulder joints 04/02/2016  . Leg weakness, bilateral 04/02/2016  . Encounter for therapeutic drug monitoring 11/29/2015  . Encounter for health maintenance examination in adult 10/30/2015  . Shoulder pain, bilateral 10/30/2015  . Muscle weakness 10/30/2015  . Generalized weakness 10/30/2015  . Rash 10/30/2015  . PVC (premature ventricular contraction) 10/10/2015  . Decreased energy 06/20/2015  . DVT (deep venous thrombosis) ([place]) 06/20/2015  . Hammer toe of left foot 06/20/2015  . Pre-ulcerative calluses 06/20/2015  . Hypertrophic toenail 06/20/2015  . Diabetic mononeuropathy associated with diabetes mellitus due to underlying condition ([place]) 06/20/2015  . Leg pain, bilateral 06/20/2015  . Chronic low back pain 06/20/2015  . 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 06/14/2015  . OSA on CPAP 03/06/2015  . Edema 03/06/2015  . Leg pain, diffuse 03/06/2015  . Bilateral low back pain without sciatica 03/06/2015  . Tremor 03/06/2015  . Insomnia 03/06/2015  . Vaccine counseling 03/06/2015  . Chronic back pain  03/06/2015  . Wears glasses 03/06/2015  . Hearing loss 03/06/2015  . Spinal stenosis of lumbar region 03/06/2015  . Ptosis 03/06/2015  . Positive depression screening 03/06/2015  . Advance directive discussed with patient 03/06/2015  . Bradycardia 02/27/2015  . Current use of long term anticoagulation   . Dementia without behavioral disturbance ([place])   . Essential hypertension 02/08/2015  . Hyperlipidemia 02/08/2015  . SOB (shortness of breath) 02/08/2015  . OSA (obstructive sleep apnea) 02/08/2015  . PE (pulmonary embolism) 01/31/2015  . Memory loss 03/12/2013  . Diabetes mellitus ([place]) 03/12/2013    Ganon Demasi,CHRIS, PTA 02/29/2020, 6:44 PM  [facility] [address] [place], [place], [address] Phone: [phone]   Fax:  [phone]  Name: RISHI GEMMELL MRN: [id number] Date of Birth: Mar 11, 1941

## 2020-03-02 ENCOUNTER — Other Ambulatory Visit: Payer: Self-pay

## 2020-03-02 ENCOUNTER — Ambulatory Visit: Payer: Medicare Other | Admitting: Physical Therapy

## 2020-03-02 DIAGNOSIS — M6281 Muscle weakness (generalized): Secondary | ICD-10-CM

## 2020-03-02 DIAGNOSIS — R296 Repeated falls: Secondary | ICD-10-CM

## 2020-03-02 DIAGNOSIS — R2681 Unsteadiness on feet: Secondary | ICD-10-CM

## 2020-03-02 NOTE — Therapy (Signed)
[facility] [place], [place], [address] Phone: [phone]   Fax:  [phone]  Physical Therapy Treatment  Patient Details  Name: Christian [name] MRN: [id number] Date of Birth: [birth year] Referring Provider (PT): Chana Bode, PA-C   Encounter Date: 03/02/2020  PT End of Session - 03/02/20 1446    Visit Number  5    Number of Visits  12    Date for PT Re-Evaluation  04/06/20    Authorization Type  Progress note every 10th visit, KX modifier at 15th visit    PT Start Time  1400    PT Stop Time  1430    PT Time Calculation (min)  30 min    Equipment Utilized During Treatment  Gait belt    Activity Tolerance  Patient tolerated treatment well    Behavior During Therapy  [facility] for tasks assessed/performed       Past Medical History:  Diagnosis Date  . Agitation   . Arthritis   . Cataract   . Chronic back pain   . Confusion   . Diabetes ([place])    type 2  . DVT (deep venous thrombosis) ([place]) 01/2015  . Hearing impaired   . High cholesterol   . Hypertension   . Insomnia   . Insomnia   . Leg pain, diffuse   . Leg swelling   . Memory loss   . Mild cognitive impairment    sees [facility]  . Numbness    fingers, feet, toes  . OSA on CPAP   . 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 06/14/2015  . Pulmonary embolism ([place]) 01/2015  . Spinal stenosis    lumbar  . Tremor    on propranolol  . Wears glasses     Past Surgical History:  Procedure Laterality Date  . CATARACT EXTRACTION    . COLONOSCOPY  2014  . TONSILLECTOMY      There were no vitals filed for this visit.  Subjective Assessment - 03/02/20 1510    Subjective  COVID-19 screening performed upon arrival.  Pt arriving to therapy 15 minutes late.    Pertinent History  DM, Neuropathy, early onset dementia, possible stroke    Limitations  Standing;Walking;House hold activities    Diagnostic tests   n/a    Currently in Pain?  No/denies         [facility] PT Assessment - 03/02/20 0001      Ambulation/Gait   Assistive device  Straight cane    Gait Pattern  Step-through pattern;Decreased stride length;Decreased step length - right;Decreased step length - left;Decreased dorsiflexion - left;Decreased dorsiflexion - right;Shuffle;Decreased trunk rotation;Trunk flexed;Wide base of support;Poor foot clearance - left;Poor foot clearance - right;Step-to pattern    Ambulation Surface  Level;Unlevel    Ramp  5: Supervision                   [facility] Adult PT Treatment/Exercise - 03/02/20 0001      Ambulation/Gait   Gait Comments  metronome beats with clapping to help with sequencing and step timing/cadance, instruction on how to navigate steps and curbs using a straight cane. Pt needed min assist for cane placement.       Posture/Postural Control   Postural Limitations  Rounded Shoulders;Forward head;Decreased lumbar lordosis;Increased thoracic kyphosis      Exercises   Exercises  Knee/Hip;Lumbar      Lumbar Exercises: Standing   Other Standing Lumbar Exercises  tapping 2 pods alteranating feet using 4 pound ankle weights, pt having difficulty with hip flexion and lifitng foot to top of the pod. Pt then repeated with no ankle weights and still presented with difficulty.     Other Standing Lumbar Exercises  standing on [employer] with CGA x 2 minutes with increased anterior posterior sway. Holding small step forward with CGA for balance.       Lumbar Exercises: Seated   Long Arc Quad on Chair  Both;10 reps;2 sets    LAQ on Chair Weights (lbs)  4     Sit to Stand  10 reps    Other Seated Lumbar Exercises  trunk flexion with bilateral hands on physioball x 15 reps with verbal instrucitons for shoulder flexion and technique    Other Seated Lumbar Exercises  clam shells with green theraband x 15 reps      Knee/Hip Exercises: Seated   Marching  AROM;Strengthening;Both;20 reps    Marching  Limitations  4 pound ankle weights                  PT Long Term Goals - 02/17/20 1407      PT LONG TERM GOAL #1   Title  Patient will be modified independent with HEP with assistance from wife.    Time  6    Period  Weeks    Status  New      PT LONG TERM GOAL #2   Title  Patient will demonstrate 4+/5 or greater bilateral LE MMT to improve stability during functional tasks.    Time  6    Status  New      PT LONG TERM GOAL #3   Title  Patient will perform nustep at level 4 or greater with 65+ steps per minute for 15 minutes to improve cardiovascular health and muscular endurance.    Time  6    Period  Weeks    Status  New      PT LONG TERM GOAL #4   Title  Patient will functional LE strength and decrease fall risk as noted by the ability to perform modified 5x sit to stand with UE support in 30 seconds or less.      PT LONG TERM GOAL #5   Title  Patient will decrease risk of falls as noted by a 5 point or greater improvement of Berg Balance Scale.    Time  6    Period  Weeks    Status  New            Plan - 03/02/20 1447    Clinical Impression Statement  Pt arriving to therapy 15 minutes late. Pt requiring assistance to get from car to clinic building by rehab staff. Pt with limited tolreance to exericses with sitting rest breaks required. Entire session gait belt used. Pt with increased difficulty with sequencing with shuffeling gait pattern noted. Pt responded well to cadance beats for gait training. Continue skilled PT as pt tolerates.    Personal Factors and Comorbidities  Age;Behavior Pattern;Time since onset of injury/illness/exacerbation;Comorbidity 2    Comorbidities  DM, Neuropathy, early onset dementia, possible stroke    Examination-Activity Limitations  Bathing;Bed Mobility;Locomotion Level;Transfers;Stand;Toileting;Hygiene/Grooming    Stability/Clinical Decision Making  Evolving/Moderate complexity    Rehab Potential  Poor    PT Frequency  2x /  week    PT Duration  6 weeks    PT Treatment/Interventions  ADLs/Self Care Home Management;Stair training;Gait training;Functional mobility training;Therapeutic activities;Therapeutic  exercise;Balance training;Neuromuscular re-education;Manual techniques;Energy conservation;Patient/family education;Passive range of motion    PT Next Visit Plan  Nustep with 60+ steps per min for cardio endurance, general strengthening, Core stabilization    PT Home Exercise Plan  see patient education section    Consulted and Agree with Plan of Care  Patient       Patient will benefit from skilled therapeutic intervention in order to improve the following deficits and impairments:  Abnormal gait, Decreased activity tolerance, Decreased balance, Decreased strength, Difficulty walking, Decreased safety awareness, Impaired perceived functional ability, Decreased mobility  Visit Diagnosis: Repeated falls  Unsteadiness on feet  Muscle weakness (generalized)     Problem List Patient Active Problem List   Diagnosis Date Noted  . Benign prostatic hyperplasia with lower urinary tract symptoms 06/04/2019  . Urge incontinence 06/04/2019  . History of stroke 06/02/2019  . Medicare annual wellness visit, subsequent 06/02/2019  . Urinary incontinence 10/28/2018  . Polyuria 10/28/2018  . Dermatitis 10/28/2018  . Tinea cruris 03/25/2018  . Tick bite 03/25/2018  . Balanitis 03/25/2018  . At moderate risk for fall 12/31/2017  . Alcohol abuse 12/29/2017  . History of pulmonary embolism 12/29/2017  . Depression, recurrent ([place]) 12/29/2017  . Stroke ([place]) 12/16/2017  . Alcohol intoxication ([place]) 12/16/2017  . Wears hearing aid 06/09/2017  . Tinnitus of both ears 06/09/2017  . Bilateral impacted cerumen 06/09/2017  . Left wrist pain 06/26/2016  . Morning joint stiffness 06/26/2016  . Polyarthralgia 06/26/2016  . Gait disturbance 04/02/2016  . Physical deconditioning 04/02/2016  . Pain of both shoulder joints  04/02/2016  . Leg weakness, bilateral 04/02/2016  . Encounter for therapeutic drug monitoring 11/29/2015  . Encounter for health maintenance examination in adult 10/30/2015  . Shoulder pain, bilateral 10/30/2015  . Muscle weakness 10/30/2015  . Generalized weakness 10/30/2015  . Rash 10/30/2015  . PVC (premature ventricular contraction) 10/10/2015  . Decreased energy 06/20/2015  . DVT (deep venous thrombosis) ([place]) 06/20/2015  . Hammer toe of left foot 06/20/2015  . Pre-ulcerative calluses 06/20/2015  . Hypertrophic toenail 06/20/2015  . Diabetic mononeuropathy associated with diabetes mellitus due to underlying condition ([place]) 06/20/2015  . Leg pain, bilateral 06/20/2015  . Chronic low back pain 06/20/2015  . 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 06/14/2015  . OSA on CPAP 03/06/2015  . Edema 03/06/2015  . Leg pain, diffuse 03/06/2015  . Bilateral low back pain without sciatica 03/06/2015  . Tremor 03/06/2015  . Insomnia 03/06/2015  . Vaccine counseling 03/06/2015  . Chronic back pain 03/06/2015  . Wears glasses 03/06/2015  . Hearing loss 03/06/2015  . Spinal stenosis of lumbar region 03/06/2015  . Ptosis 03/06/2015  . Positive depression screening 03/06/2015  . Advance directive discussed with patient 03/06/2015  . Bradycardia 02/27/2015  . Current use of long term anticoagulation   . Dementia without behavioral disturbance ([place])   . Essential hypertension 02/08/2015  . Hyperlipidemia 02/08/2015  . SOB (shortness of breath) 02/08/2015  . OSA (obstructive sleep apnea) 02/08/2015  . PE (pulmonary embolism) 01/31/2015  . Memory loss 03/12/2013  . Diabetes mellitus ([place]) 03/12/2013    Oretha Caprice, MPT 03/02/2020, 3:22 PM  [facility] [place], [place], [address] Phone: [phone]   Fax:  [phone]  Name: TAREQ FERRAR MRN: [id number] Date of Birth:  02/21/1941

## 2020-03-03 ENCOUNTER — Other Ambulatory Visit: Payer: Self-pay

## 2020-03-03 ENCOUNTER — Emergency Department (HOSPITAL_COMMUNITY): Payer: Medicare Other

## 2020-03-03 ENCOUNTER — Inpatient Hospital Stay (HOSPITAL_COMMUNITY)
Admission: EM | Admit: 2020-03-03 | Discharge: 2020-03-07 | DRG: 481 | Disposition: A | Discharge status: Skilled Nursing Facility | Payer: Medicare Other | Attending: Internal Medicine | Admitting: Internal Medicine

## 2020-03-03 ENCOUNTER — Encounter (HOSPITAL_COMMUNITY): Payer: Self-pay | Admitting: *Deleted

## 2020-03-03 DIAGNOSIS — S72002A Fracture of unspecified part of neck of left femur, initial encounter for closed fracture: Secondary | ICD-10-CM | POA: Diagnosis not present

## 2020-03-03 DIAGNOSIS — F329 Major depressive disorder, single episode, unspecified: Secondary | ICD-10-CM | POA: Diagnosis present

## 2020-03-03 DIAGNOSIS — G4700 Insomnia, unspecified: Secondary | ICD-10-CM | POA: Diagnosis not present

## 2020-03-03 DIAGNOSIS — S199XXA Unspecified injury of neck, initial encounter: Secondary | ICD-10-CM | POA: Diagnosis not present

## 2020-03-03 DIAGNOSIS — S72142A Displaced intertrochanteric fracture of left femur, initial encounter for closed fracture: Principal | ICD-10-CM

## 2020-03-03 DIAGNOSIS — E1165 Type 2 diabetes mellitus with hyperglycemia: Secondary | ICD-10-CM | POA: Diagnosis not present

## 2020-03-03 DIAGNOSIS — E669 Obesity, unspecified: Secondary | ICD-10-CM | POA: Diagnosis present

## 2020-03-03 DIAGNOSIS — Z806 Family history of leukemia: Secondary | ICD-10-CM

## 2020-03-03 DIAGNOSIS — Z91030 Bee allergy status: Secondary | ICD-10-CM | POA: Diagnosis not present

## 2020-03-03 DIAGNOSIS — Z832 Family history of diseases of the blood and blood-forming organs and certain disorders involving the immune mechanism: Secondary | ICD-10-CM

## 2020-03-03 DIAGNOSIS — R251 Tremor, unspecified: Secondary | ICD-10-CM | POA: Diagnosis not present

## 2020-03-03 DIAGNOSIS — F0390 Unspecified dementia without behavioral disturbance: Secondary | ICD-10-CM | POA: Diagnosis present

## 2020-03-03 DIAGNOSIS — R413 Other amnesia: Secondary | ICD-10-CM | POA: Diagnosis present

## 2020-03-03 DIAGNOSIS — D6852 Prothrombin gene mutation: Secondary | ICD-10-CM | POA: Diagnosis not present

## 2020-03-03 DIAGNOSIS — Z86718 Personal history of other venous thrombosis and embolism: Secondary | ICD-10-CM | POA: Diagnosis not present

## 2020-03-03 DIAGNOSIS — F1010 Alcohol abuse, uncomplicated: Secondary | ICD-10-CM | POA: Diagnosis present

## 2020-03-03 DIAGNOSIS — E785 Hyperlipidemia, unspecified: Secondary | ICD-10-CM | POA: Diagnosis not present

## 2020-03-03 DIAGNOSIS — S0990XA Unspecified injury of head, initial encounter: Secondary | ICD-10-CM | POA: Diagnosis not present

## 2020-03-03 DIAGNOSIS — Z741 Need for assistance with personal care: Secondary | ICD-10-CM | POA: Diagnosis not present

## 2020-03-03 DIAGNOSIS — M549 Dorsalgia, unspecified: Secondary | ICD-10-CM | POA: Diagnosis present

## 2020-03-03 DIAGNOSIS — R52 Pain, unspecified: Secondary | ICD-10-CM | POA: Diagnosis not present

## 2020-03-03 DIAGNOSIS — E1140 Type 2 diabetes mellitus with diabetic neuropathy, unspecified: Secondary | ICD-10-CM | POA: Diagnosis not present

## 2020-03-03 DIAGNOSIS — G4733 Obstructive sleep apnea (adult) (pediatric): Secondary | ICD-10-CM | POA: Diagnosis not present

## 2020-03-03 DIAGNOSIS — R262 Difficulty in walking, not elsewhere classified: Secondary | ICD-10-CM | POA: Diagnosis not present

## 2020-03-03 DIAGNOSIS — R0902 Hypoxemia: Secondary | ICD-10-CM | POA: Diagnosis not present

## 2020-03-03 DIAGNOSIS — G8929 Other chronic pain: Secondary | ICD-10-CM | POA: Diagnosis present

## 2020-03-03 DIAGNOSIS — Z86711 Personal history of pulmonary embolism: Secondary | ICD-10-CM | POA: Diagnosis present

## 2020-03-03 DIAGNOSIS — Z8781 Personal history of (healed) traumatic fracture: Secondary | ICD-10-CM

## 2020-03-03 DIAGNOSIS — M6281 Muscle weakness (generalized): Secondary | ICD-10-CM | POA: Diagnosis not present

## 2020-03-03 DIAGNOSIS — M25552 Pain in left hip: Secondary | ICD-10-CM | POA: Diagnosis not present

## 2020-03-03 DIAGNOSIS — F0391 Unspecified dementia with behavioral disturbance: Secondary | ICD-10-CM | POA: Diagnosis not present

## 2020-03-03 DIAGNOSIS — I482 Chronic atrial fibrillation, unspecified: Secondary | ICD-10-CM | POA: Diagnosis present

## 2020-03-03 DIAGNOSIS — W19XXXA Unspecified fall, initial encounter: Secondary | ICD-10-CM | POA: Diagnosis present

## 2020-03-03 DIAGNOSIS — S299XXA Unspecified injury of thorax, initial encounter: Secondary | ICD-10-CM | POA: Diagnosis not present

## 2020-03-03 DIAGNOSIS — Z8673 Personal history of transient ischemic attack (TIA), and cerebral infarction without residual deficits: Secondary | ICD-10-CM | POA: Diagnosis not present

## 2020-03-03 DIAGNOSIS — R410 Disorientation, unspecified: Secondary | ICD-10-CM | POA: Diagnosis not present

## 2020-03-03 DIAGNOSIS — Z794 Long term (current) use of insulin: Secondary | ICD-10-CM | POA: Diagnosis not present

## 2020-03-03 DIAGNOSIS — Z8249 Family history of ischemic heart disease and other diseases of the circulatory system: Secondary | ICD-10-CM

## 2020-03-03 DIAGNOSIS — I10 Essential (primary) hypertension: Secondary | ICD-10-CM | POA: Diagnosis not present

## 2020-03-03 DIAGNOSIS — Z20822 Contact with and (suspected) exposure to covid-19: Secondary | ICD-10-CM | POA: Diagnosis present

## 2020-03-03 DIAGNOSIS — F339 Major depressive disorder, recurrent, unspecified: Secondary | ICD-10-CM | POA: Diagnosis present

## 2020-03-03 DIAGNOSIS — E119 Type 2 diabetes mellitus without complications: Secondary | ICD-10-CM | POA: Diagnosis not present

## 2020-03-03 DIAGNOSIS — E1159 Type 2 diabetes mellitus with other circulatory complications: Secondary | ICD-10-CM | POA: Diagnosis present

## 2020-03-03 DIAGNOSIS — G3184 Mild cognitive impairment, so stated: Secondary | ICD-10-CM | POA: Diagnosis not present

## 2020-03-03 DIAGNOSIS — Z03818 Encounter for observation for suspected exposure to other biological agents ruled out: Secondary | ICD-10-CM | POA: Diagnosis not present

## 2020-03-03 DIAGNOSIS — I4510 Unspecified right bundle-branch block: Secondary | ICD-10-CM | POA: Diagnosis not present

## 2020-03-03 DIAGNOSIS — S72145A Nondisplaced intertrochanteric fracture of left femur, initial encounter for closed fracture: Secondary | ICD-10-CM | POA: Diagnosis not present

## 2020-03-03 DIAGNOSIS — Z885 Allergy status to narcotic agent status: Secondary | ICD-10-CM

## 2020-03-03 DIAGNOSIS — M1990 Unspecified osteoarthritis, unspecified site: Secondary | ICD-10-CM | POA: Diagnosis present

## 2020-03-03 DIAGNOSIS — Z4789 Encounter for other orthopedic aftercare: Secondary | ICD-10-CM | POA: Diagnosis not present

## 2020-03-03 DIAGNOSIS — N400 Enlarged prostate without lower urinary tract symptoms: Secondary | ICD-10-CM | POA: Diagnosis not present

## 2020-03-03 DIAGNOSIS — H9190 Unspecified hearing loss, unspecified ear: Secondary | ICD-10-CM | POA: Diagnosis present

## 2020-03-03 DIAGNOSIS — M545 Low back pain: Secondary | ICD-10-CM | POA: Diagnosis not present

## 2020-03-03 DIAGNOSIS — R451 Restlessness and agitation: Secondary | ICD-10-CM | POA: Diagnosis not present

## 2020-03-03 DIAGNOSIS — R41841 Cognitive communication deficit: Secondary | ICD-10-CM | POA: Diagnosis not present

## 2020-03-03 DIAGNOSIS — N401 Enlarged prostate with lower urinary tract symptoms: Secondary | ICD-10-CM | POA: Diagnosis present

## 2020-03-03 DIAGNOSIS — R5381 Other malaise: Secondary | ICD-10-CM | POA: Diagnosis present

## 2020-03-03 DIAGNOSIS — Z87891 Personal history of nicotine dependence: Secondary | ICD-10-CM

## 2020-03-03 DIAGNOSIS — Z9181 History of falling: Secondary | ICD-10-CM | POA: Diagnosis not present

## 2020-03-03 DIAGNOSIS — Z7901 Long term (current) use of anticoagulants: Secondary | ICD-10-CM | POA: Diagnosis not present

## 2020-03-03 DIAGNOSIS — M4800 Spinal stenosis, site unspecified: Secondary | ICD-10-CM | POA: Diagnosis present

## 2020-03-03 DIAGNOSIS — M48061 Spinal stenosis, lumbar region without neurogenic claudication: Secondary | ICD-10-CM | POA: Diagnosis not present

## 2020-03-03 DIAGNOSIS — I152 Hypertension secondary to endocrine disorders: Secondary | ICD-10-CM | POA: Diagnosis present

## 2020-03-03 DIAGNOSIS — E7800 Pure hypercholesterolemia, unspecified: Secondary | ICD-10-CM | POA: Diagnosis present

## 2020-03-03 DIAGNOSIS — Z7984 Long term (current) use of oral hypoglycemic drugs: Secondary | ICD-10-CM

## 2020-03-03 DIAGNOSIS — S72142D Displaced intertrochanteric fracture of left femur, subsequent encounter for closed fracture with routine healing: Secondary | ICD-10-CM | POA: Diagnosis not present

## 2020-03-03 DIAGNOSIS — Z6833 Body mass index (BMI) 33.0-33.9, adult: Secondary | ICD-10-CM

## 2020-03-03 DIAGNOSIS — M159 Polyosteoarthritis, unspecified: Secondary | ICD-10-CM | POA: Diagnosis not present

## 2020-03-03 DIAGNOSIS — Z79899 Other long term (current) drug therapy: Secondary | ICD-10-CM

## 2020-03-03 DIAGNOSIS — S72002D Fracture of unspecified part of neck of left femur, subsequent encounter for closed fracture with routine healing: Secondary | ICD-10-CM | POA: Diagnosis not present

## 2020-03-03 DIAGNOSIS — Z66 Do not resuscitate: Secondary | ICD-10-CM | POA: Diagnosis present

## 2020-03-03 LAB — CBC
HCT: 44.8 % (ref 39.0–52.0)
Hemoglobin: 14.1 g/dL (ref 13.0–17.0)
MCH: 29.7 pg (ref 26.0–34.0)
MCHC: 31.5 g/dL (ref 30.0–36.0)
MCV: 94.5 fL (ref 80.0–100.0)
Platelets: 227 10*3/uL (ref 150–400)
RBC: 4.74 MIL/uL (ref 4.22–5.81)
RDW: 12.3 % (ref 11.5–15.5)
WBC: 9.7 10*3/uL (ref 4.0–10.5)
nRBC: 0 % (ref 0.0–0.2)

## 2020-03-03 LAB — GLUCOSE, CAPILLARY: Glucose-Capillary: 254 mg/dL — ABNORMAL HIGH (ref 70–99)

## 2020-03-03 LAB — PREPARE RBC (CROSSMATCH)

## 2020-03-03 LAB — COMPREHENSIVE METABOLIC PANEL
ALT: 15 U/L (ref 0–44)
AST: 17 U/L (ref 15–41)
Albumin: 3.5 g/dL (ref 3.5–5.0)
Alkaline Phosphatase: 86 U/L (ref 38–126)
Anion gap: 9 (ref 5–15)
BUN: 17 mg/dL (ref 8–23)
CO2: 23 mmol/L (ref 22–32)
Calcium: 8.7 mg/dL — ABNORMAL LOW (ref 8.9–10.3)
Chloride: 104 mmol/L (ref 98–111)
Creatinine, Ser: 0.88 mg/dL (ref 0.61–1.24)
GFR calc Af Amer: >60 mL/min (ref >60)
GFR calc non Af Amer: >60 mL/min (ref >60)
Glucose, Bld: 321 mg/dL — ABNORMAL HIGH (ref 70–99)
Potassium: 4.4 mmol/L (ref 3.5–5.1)
Sodium: 136 mmol/L (ref 135–145)
Total Bilirubin: 0.5 mg/dL (ref 0.3–1.2)
Total Protein: 6.7 g/dL (ref 6.5–8.1)

## 2020-03-03 LAB — ABO/RH: ABO/RH(D): B NEG

## 2020-03-03 MED ORDER — MORPHINE SULFATE (PF) 2 MG/ML IV SOLN
2.0000 mg | Freq: Once | INTRAVENOUS | Status: AC
  Administered 2020-03-03: 2 mg via INTRAVENOUS
  Filled 2020-03-03: qty 1

## 2020-03-03 MED ORDER — DILTIAZEM HCL 30 MG PO TABS
30.0000 mg | ORAL_TABLET | Freq: Two times a day (BID) | ORAL | Status: DC
  Administered 2020-03-03 – 2020-03-07 (×7): 30 mg via ORAL
  Filled 2020-03-03 (×7): qty 1

## 2020-03-03 MED ORDER — INSULIN ASPART 100 UNIT/ML ~~LOC~~ SOLN
0.0000 [IU] | Freq: Every day | SUBCUTANEOUS | Status: DC
  Administered 2020-03-03 – 2020-03-04 (×2): 3 [IU] via SUBCUTANEOUS
  Administered 2020-03-05: 2 [IU] via SUBCUTANEOUS
  Administered 2020-03-06: 3 [IU] via SUBCUTANEOUS

## 2020-03-03 MED ORDER — ROSUVASTATIN CALCIUM 10 MG PO TABS
10.0000 mg | ORAL_TABLET | Freq: Every day | ORAL | Status: DC
  Administered 2020-03-04 – 2020-03-07 (×3): 10 mg via ORAL
  Filled 2020-03-03 (×3): qty 1

## 2020-03-03 MED ORDER — DONEPEZIL HCL 5 MG PO TABS
10.0000 mg | ORAL_TABLET | Freq: Every day | ORAL | Status: DC
  Administered 2020-03-03 – 2020-03-06 (×4): 10 mg via ORAL
  Filled 2020-03-03 (×4): qty 2

## 2020-03-03 MED ORDER — INSULIN ASPART 100 UNIT/ML ~~LOC~~ SOLN
0.0000 [IU] | Freq: Three times a day (TID) | SUBCUTANEOUS | Status: DC
  Administered 2020-03-04: 11 [IU] via SUBCUTANEOUS
  Administered 2020-03-04: 7 [IU] via SUBCUTANEOUS
  Administered 2020-03-04: 11 [IU] via SUBCUTANEOUS
  Administered 2020-03-05 – 2020-03-06 (×2): 4 [IU] via SUBCUTANEOUS
  Administered 2020-03-06: 7 [IU] via SUBCUTANEOUS
  Administered 2020-03-06: 11 [IU] via SUBCUTANEOUS
  Administered 2020-03-07: 7 [IU] via SUBCUTANEOUS
  Administered 2020-03-07: 3 [IU] via SUBCUTANEOUS

## 2020-03-03 MED ORDER — DOCUSATE SODIUM 100 MG PO CAPS
100.0000 mg | ORAL_CAPSULE | Freq: Two times a day (BID) | ORAL | Status: DC
  Administered 2020-03-03 – 2020-03-04 (×3): 100 mg via ORAL
  Filled 2020-03-03 (×3): qty 1

## 2020-03-03 MED ORDER — TAMSULOSIN HCL 0.4 MG PO CAPS
0.4000 mg | ORAL_CAPSULE | Freq: Every day | ORAL | Status: DC
  Administered 2020-03-04 – 2020-03-07 (×3): 0.4 mg via ORAL
  Filled 2020-03-03 (×3): qty 1

## 2020-03-03 MED ORDER — ONDANSETRON HCL 4 MG/2ML IJ SOLN: 4.0000 mg | Freq: Four times a day (QID) | INTRAMUSCULAR | Status: DC | PRN

## 2020-03-03 MED ORDER — FLECAINIDE ACETATE 50 MG PO TABS
75.0000 mg | ORAL_TABLET | Freq: Two times a day (BID) | ORAL | Status: DC
  Administered 2020-03-03 – 2020-03-07 (×7): 75 mg via ORAL
  Filled 2020-03-03 (×7): qty 2

## 2020-03-03 MED ORDER — FESOTERODINE FUMARATE ER 4 MG PO TB24
8.0000 mg | ORAL_TABLET | Freq: Every day | ORAL | Status: DC
  Administered 2020-03-04 – 2020-03-07 (×3): 8 mg via ORAL
  Filled 2020-03-03 (×3): qty 2

## 2020-03-03 MED ORDER — SODIUM CHLORIDE 0.9% IV SOLUTION: Freq: Once | INTRAVENOUS | Status: DC

## 2020-03-03 MED ORDER — MEMANTINE HCL ER 28 MG PO CP24
28.0000 mg | ORAL_CAPSULE | Freq: Every day | ORAL | Status: DC
  Administered 2020-03-04 – 2020-03-07 (×3): 28 mg via ORAL
  Filled 2020-03-03 (×5): qty 1

## 2020-03-03 MED ORDER — INSULIN DETEMIR 100 UNIT/ML ~~LOC~~ SOLN
60.0000 [IU] | Freq: Every day | SUBCUTANEOUS | Status: DC
  Administered 2020-03-04: 60 [IU] via SUBCUTANEOUS
  Filled 2020-03-03 (×3): qty 0.6

## 2020-03-03 MED ORDER — HYDROCODONE-ACETAMINOPHEN 5-325 MG PO TABS
1.0000 | ORAL_TABLET | ORAL | Status: DC | PRN
  Administered 2020-03-04 – 2020-03-05 (×4): 2 via ORAL
  Administered 2020-03-06: 1 via ORAL
  Filled 2020-03-03: qty 2
  Filled 2020-03-03: qty 1
  Filled 2020-03-03 (×3): qty 2

## 2020-03-03 MED ORDER — ONDANSETRON HCL 4 MG PO TABS: 4.0000 mg | ORAL_TABLET | Freq: Four times a day (QID) | ORAL | Status: DC | PRN

## 2020-03-03 MED ORDER — POLYETHYLENE GLYCOL 3350 17 G PO PACK: 17.0000 g | PACK | Freq: Every day | ORAL | Status: DC | PRN

## 2020-03-03 NOTE — H&P (Signed)
History and Physical  Christian [name] [id number] DOB: 06/27/1941 DOA: 03/03/2020  Referring physician: Nuala Alpha, PA-C, ED Provider PCP: Carlena Hurl, PA-C  Outpatient Specialists:   Patient Coming From: home  Chief Complaint: fall  HPI: Christian [name] is a 79 y.o. male with a history of PE and DVT secondary to prothrombin gene, currently on anticoagulation, type 2 diabetes with poor control, hypertension, dementia, depression.  Patient seen for fall that occurred earlier today.  He was attempting to ambulate and fell when he tried to get out of his chair.  He landed on his left hip and had immediate pain that is nonradiating.  Pain worse with movement and improved with rest.  EMS was called and the patient was taken to the hospital for evaluation.  In conversing with wife, the patient has "given up on life".  He has had limited mobility, eats what he wants, does not take check his blood sugars.  Patient mostly sits in chair or is in bed.  He has had severe deconditioning has been ongoing for the past few months.  Emergency Department Course: Hip x-ray shows left femoral neck fracture that is comminuted, displaced, and angulated.  Orthopedics has been consulted  Review of Systems:   Pt denies any fevers, chills, nausea, vomiting, diarrhea, constipation, abdominal pain, shortness of breath, dyspnea on exertion, orthopnea, cough, wheezing, palpitations, headache, vision changes, lightheadedness, dizziness, melena, rectal bleeding.  Review of systems are otherwise negative  Past Medical History:  Diagnosis Date  . Agitation   . Arthritis   . Cataract   . Chronic back pain   . Confusion   . Diabetes ([place])    type 2  . DVT (deep venous thrombosis) ([place]) 01/2015  . Hearing impaired   . High cholesterol   . Hypertension   . Insomnia   . Insomnia   . Leg pain, diffuse   . Leg swelling   . Memory loss   . Mild cognitive impairment    sees [facility]  . Numbness      fingers, feet, toes  . OSA on CPAP   . 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 06/14/2015  . Pulmonary embolism ([place]) 01/2015  . Spinal stenosis    lumbar  . Tremor    on propranolol  . Wears glasses    Past Surgical History:  Procedure Laterality Date  . CATARACT EXTRACTION    . COLONOSCOPY  2014  . TONSILLECTOMY     Social History:  reports that he quit smoking about 15 years ago. His smoking use included cigarettes. He has a 10.00 pack-year smoking history. He has never used smokeless tobacco. He reports current alcohol use of about 1.0 standard drinks of alcohol per week. He reports previous drug use. Patient lives at home with wife  Allergies  Allergen Reactions  . Bee Venom Swelling  . Ultram [Tramadol Hcl]     Makes him wired, gives insomnia  . Oxycodone Other (See Comments)    Mental status changes  . Oxycontin [Oxycodone Hcl] Other (See Comments)    Mental status changes per pt    Family History  Problem Relation Age of Onset  . Dementia Father   . Clotting disorder Mother   . Heart disease Brother   . Clotting disorder Brother   . Psychiatric Illness Sister   . Leukemia Sister       Prior to Admission medications   Medication Sig Start Date End  Date Taking? Authorizing Provider  tolterodine (DETROL LA) 4 MG 24 hr capsule Take 4 mg by mouth 2 (two) times daily. 02/18/20  Yes [provider]  diltiazem (CARDIZEM) 30 MG tablet TAKE 1 TABLET(30 MG) BY MOUTH TWICE DAILY Patient taking differently: Take 30 mg by mouth 2 (two) times daily.  01/03/20   Arnoldo Lenis, MD  donepezil (ARICEPT) 10 MG tablet Take 1 tablet (10 mg) by mouth at bedtime. 01/17/20   Lomax, Amy, NP  flecainide (TAMBOCOR) 150 MG tablet TAKE 1/2 TABLET(75 MG) BY MOUTH TWICE DAILY 10/25/19   Evans Lance, MD  folic acid (FOLVITE) 1 MG tablet TAKE 1 TABLET(1 MG) BY MOUTH DAILY Patient taking differently: Take 1 mg by mouth daily. TAKE 1  TABLET(1 MG) BY MOUTH DAILY 07/21/19   Tysinger, Camelia Eng, PA-C  furosemide (LASIX) 40 MG tablet Take 40 mg daily AS NEEDED  For swelling. Patient taking differently: Take 40 mg by mouth daily as needed for fluid.  10/09/18   Arnoldo Lenis, MD  glucose blood test strip True Metrix test strips. Check twice daily 11/09/19   Tysinger, Camelia Eng, PA-C  memantine (NAMENDA XR) 28 MG CP24 24 hr capsule TAKE 1 CAPSULE(28 MG) BY MOUTH DAILY 08/03/19   Star Age, MD  metFORMIN (GLUCOPHAGE) 850 MG tablet TAKE 1 TABLET(850 MG) BY MOUTH TWICE DAILY WITH A MEAL Patient taking differently: Take 850 mg by mouth 2 (two) times daily with a meal.  12/10/19   Tysinger, Camelia Eng, PA-C  Misc Natural Products (OSTEO BI-FLEX ADV JOINT SHIELD PO) Take 1 tablet by mouth daily.     [provider]  rosuvastatin (CRESTOR) 10 MG tablet TAKE 1 TABLET(10 MG) BY MOUTH AT BEDTIME Patient taking differently: Take 10 mg by mouth daily.  11/18/19   Tysinger, Camelia Eng, PA-C  tamsulosin (FLOMAX) 0.4 MG CAPS capsule TAKE 1 CAPSULE(0.4 MG) BY MOUTH DAILY Patient taking differently: Take 0.4 mg by mouth daily.  02/07/20   Tysinger, Camelia Eng, PA-C  TRESIBA FLEXTOUCH 100 UNIT/ML FlexTouch Pen ADMINISTER 41 UNITS UNDER THE SKIN DAILY AT 10 PM Patient taking differently: Inject 41 Units into the skin at bedtime.  01/31/20   Tysinger, Camelia Eng, PA-C  XARELTO 20 MG TABS tablet TAKE 1 TABLET BY MOUTH EVERY DAY WITH SUPPER Patient taking differently: Take 20 mg by mouth daily with supper.  01/17/20   Tysinger, Camelia Eng, PA-C    Physical Exam: BP 139/69   Pulse 68   Temp 98.9 F (37.2 C) (Oral)   Resp 11   Ht 5\' 11"  (1.803 m)   Wt 108 kg   SpO2 96%   BMI 33.19 kg/m   . General: Elderly male. Awake and alert and oriented x3. No acute cardiopulmonary distress.  [name] HEENT: Normocephalic atraumatic.  Right and left ears normal in appearance.  Pupils equal, round, reactive to light. Extraocular muscles are intact. Sclerae anicteric and  noninjected.  Moist mucosal membranes. No mucosal lesions.  . Neck: Neck supple without lymphadenopathy. No carotid bruits. No masses palpated.  . Cardiovascular: Regular rate with normal S1-S2 sounds. No murmurs, rubs, gallops auscultated. No JVD.  [name] Respiratory: Good respiratory effort with no wheezes, rales, rhonchi. Lungs clear to auscultation bilaterally.  No accessory muscle use. . Abdomen: Soft, nontender, nondistended. Active bowel sounds. No masses or hepatosplenomegaly  . Skin: No rashes, lesions, or ulcerations.  Dry, warm to touch. 2+ dorsalis pedis and radial pulses. . Musculoskeletal: Left leg shortened and externally rotated.  Neurovascularly intact distal to the fracture . Psychiatric: Intact judgment and insight. Pleasant and cooperative. . Neurologic: No focal neurological deficits. Strength is 5/5 and symmetric in upper and lower extremities.  Cranial nerves II through XII are grossly intact.           Labs on Admission: I have personally reviewed following labs and imaging studies  CBC: Recent Labs  Lab 03/03/20 1811  WBC 9.7  HGB 14.1  HCT 44.8  MCV 94.5  PLT [id number]   Basic Metabolic Panel: Recent Labs  Lab 03/03/20 1811  NA 136  K 4.4  CL 104  CO2 23  GLUCOSE 321*  BUN 17  CREATININE 0.88  CALCIUM 8.7*   GFR: Estimated Creatinine Clearance: 89.3 mL/min (by C-G formula based on SCr of 0.88 mg/dL). Liver Function Tests: Recent Labs  Lab 03/03/20 1811  AST 17  ALT 15  ALKPHOS 86  BILITOT 0.5  PROT 6.7  ALBUMIN 3.5   No results for input(s): LIPASE, AMYLASE in the last 168 hours. No results for input(s): AMMONIA in the last 168 hours. Coagulation Profile: No results for input(s): INR, PROTIME in the last 168 hours. Cardiac Enzymes: No results for input(s): CKTOTAL, CKMB, CKMBINDEX, TROPONINI in the last 168 hours. BNP (last 3 results) No results for input(s): PROBNP in the last 8760 hours. HbA1C: No results for input(s): HGBA1C in the last 72  hours. CBG: No results for input(s): GLUCAP in the last 168 hours. Lipid Profile: No results for input(s): CHOL, HDL, LDLCALC, TRIG, CHOLHDL, LDLDIRECT in the last 72 hours. Thyroid Function Tests: No results for input(s): TSH, T4TOTAL, FREET4, T3FREE, THYROIDAB in the last 72 hours. Anemia Panel: No results for input(s): VITAMINB12, FOLATE, FERRITIN, TIBC, IRON, RETICCTPCT in the last 72 hours. Urine analysis:    Component Value Date/Time   COLORURINE YELLOW 05/12/2019 0008   APPEARANCEUR CLEAR 05/12/2019 0008   LABSPEC 1.024 05/12/2019 0008   LABSPEC 1.015 10/28/2018 1522   PHURINE 5.0 05/12/2019 0008   GLUCOSEU >=500 (A) 05/12/2019 0008   HGBUR NEGATIVE 05/12/2019 0008   BILIRUBINUR NEGATIVE 05/12/2019 0008   BILIRUBINUR negative 10/28/2018 1522   BILIRUBINUR n 05/10/2016 1500   KETONESUR 5 (A) 05/12/2019 0008   PROTEINUR NEGATIVE 05/12/2019 0008   UROBILINOGEN negative 05/10/2016 1500   UROBILINOGEN 0.2 01/31/2015 1505   NITRITE NEGATIVE 05/12/2019 0008   LEUKOCYTESUR NEGATIVE 05/12/2019 0008   Sepsis Labs: @LABRCNTIP (procalcitonin:4,lacticidven:4) )No results found for this or any previous visit (from the past 240 hour(s)).   Radiological Exams on Admission: DG Chest 1 View  Result Date: 03/03/2020 CLINICAL DATA:  Status post fall. EXAM: CHEST  1 VIEW COMPARISON:  May 11, 2019 FINDINGS: Very mild, chronic appearing increased lung markings are seen without evidence of acute infiltrate, pleural effusion or pneumothorax. The heart size and mediastinal contours are within normal limits. The visualized skeletal structures are unremarkable. IMPRESSION: No active disease. Electronically Signed   By: Virgina Norfolk M.D.   On: 03/03/2020 19:34   CT Head Wo Contrast  Result Date: 03/03/2020 CLINICAL DATA:  Fall, hit head EXAM: CT HEAD WITHOUT CONTRAST TECHNIQUE: Contiguous axial images were obtained from the base of the skull through the vertex without intravenous contrast.  COMPARISON:  12/16/2017 FINDINGS: Brain: Age related volume loss. No acute intracranial abnormality. Specifically, no hemorrhage, hydrocephalus, mass lesion, acute infarction, or significant intracranial injury. Vascular: No hyperdense vessel or unexpected calcification. Skull: No acute calvarial abnormality. Sinuses/Orbits: Visualized paranasal sinuses and mastoids clear. Orbital soft tissues unremarkable. Other:  None IMPRESSION: No acute intracranial abnormality. Electronically Signed   By: Rolm Baptise M.D.   On: 03/03/2020 19:05   CT Cervical Spine Wo Contrast  Result Date: 03/03/2020 CLINICAL DATA:  Fall, hit head EXAM: CT CERVICAL SPINE WITHOUT CONTRAST TECHNIQUE: Multidetector CT imaging of the cervical spine was performed without intravenous contrast. Multiplanar CT image reconstructions were also generated. COMPARISON:  None. FINDINGS: Alignment: Normal Skull base and vertebrae: No acute fracture. No primary bone lesion or focal pathologic process. Soft tissues and spinal canal: No prevertebral fluid or swelling. No visible canal hematoma. Disc levels:  Advanced diffuse degenerative disc and facet disease. Upper chest: No acute findings Other: None IMPRESSION: Advanced diffuse degenerative disc and facet disease. No acute bony abnormality. Electronically Signed   By: Rolm Baptise M.D.   On: 03/03/2020 19:04   DG HIP UNILAT W OR W/O PELVIS 2-3 VIEWS LEFT  Result Date: 03/03/2020 CLINICAL DATA:  79 year old male with fall and left hip pain. EXAM: DG HIP (WITH OR WITHOUT PELVIS) 2-3V LEFT COMPARISON:  Pelvic radiograph dated 11/02/2015 FINDINGS: There is a comminuted, displaced and angulated intertrochanteric fracture of the left femoral neck. There is mild elevation of the femoral shaft and impaction. There is no dislocation. The bones are osteopenic. The soft tissues are unremarkable. IMPRESSION: Comminuted, displaced and angulated intertrochanteric fracture of the left femoral neck. Electronically  Signed   By: Anner Crete M.D.   On: 03/03/2020 19:35    EKG: Independently reviewed.  Rhythm with right bundle branch block and left anterior fascicular block.  No acute ST changes.  Assessment/Plan: Principal Problem:   S/p left hip fracture Active Problems:   Diabetes mellitus ([place])   Essential hypertension   Dementia without behavioral disturbance ([place])   Prothrombin G20210A mutation, heterozygous, 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   Physical deconditioning   Alcohol abuse   History of pulmonary embolism   Depression, recurrent ([facility])   History of stroke   Benign prostatic hyperplasia with lower urinary tract symptoms    This patient was discussed with the ED physician, including pertinent vitals, physical exam findings, labs, and imaging.  We also discussed care given by the ED provider.  1. Status post left hip fracture with physical deconditioning, gait instability a. Admit b. Patient will need to be off anticoagulation for at least 48 hours prior to surgery.  The patient last took his Xarelto on 4/8 around 10 PM.  This was confirmed with the patient's wife. c. Continue pain control 2. Uncontrolled diabetes with hyperglycemia a. Hold Metformin b. Change long-acting insulin to Lantus and give 60 units at bedtime c. Sliding scale insulin with CBGs before meals and nightly 3. Hypertension a. Continue antihypertensives 4. Dementia without behavioral disturbance a. Continue Namenda and Aricept 5. History of pulmonary embolism, DVT, prothrombin gene a. Will need to resume anticoagulation as soon as possible following surgery 6. History of stroke 7. BPH a. Continue Flomax  DVT prophylaxis: SCDs Consultants: Orthopedics Code Status: Patient is DNR Family Communication: Discussed with wife Disposition Plan: We will likely need rehab following surgery   Truett Mainland, DO

## 2020-03-03 NOTE — Progress Notes (Signed)
Patient ID: Christian [name], male   DOB: 07/07/1941, 79 y.o.   MRN: [id number]  79 year old male fractured his left hip his medical problems are listed below.  I have spoken to the hospitalist we discussed his Xarelto his prothrombin antibody which makes him more prone to clotting actually.  He had a DVT PE back in 2016 hence the medication but we can take him off of that for 48 hours with his last dose being recorded as April 8 making surgery on April 10 for likely possibility  We will go ahead and type and crossmatch him for blood 3units.  We plan on doing a standard open reduction internal fixation with a gamma nail  Approximate estimated surgical time 1 hour.

## 2020-03-03 NOTE — ED Provider Notes (Signed)
[facility] EMERGENCY DEPARTMENT Provider Note   CSN: [id number] Arrival date & time: 03/03/20  1737     History Chief Complaint  Patient presents with  . Fall    Christian [name] is a 79 y.o. male history of dementia, obesity, sleep apnea, diabetes, high cholesterol, hypertension, prothrombin mutation resulting in PE/DVT and currently on long-term anticoagulation with Xarelto.  Patient reports that he has had difficulty walking for several years.  He reports that he was outside with his wife when he attempted to walk, he then tripped landing onto his left hip.  He reports immediate onset severe throbbing pain constant nonradiating worsened with movement and palpation no alleviating factors.  He reports that neighbors saw him and called EMS he reports he was on the ground for less than 10 minutes.  He reports that he did hit his head during the fall but did not lose consciousness.  He denies any other pain today.  Denies loss of consciousness, headache, vision changes, neck pain, back pain, chest pain, abdominal pain, nausea/vomiting, pain of the upper extremities or right lower extremity or any additional concerns.    HPI     Past Medical History:  Diagnosis Date  . Agitation   . Arthritis   . Cataract   . Chronic back pain   . Confusion   . Diabetes ([place])    type 2  . DVT (deep venous thrombosis) ([place]) 01/2015  . Hearing impaired   . High cholesterol   . Hypertension   . Insomnia   . Insomnia   . Leg pain, diffuse   . Leg swelling   . Memory loss   . Mild cognitive impairment    sees [facility]  . Numbness    fingers, feet, toes  . OSA on CPAP   . 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 06/14/2015  . Pulmonary embolism ([place]) 01/2015  . Spinal stenosis    lumbar  . Tremor    on propranolol  . Wears glasses     Patient Active Problem List   Diagnosis Date Noted  . S/p left hip fracture 03/03/2020  . Benign  prostatic hyperplasia with lower urinary tract symptoms 06/04/2019  . Urge incontinence 06/04/2019  . History of stroke 06/02/2019  . Medicare annual wellness visit, subsequent 06/02/2019  . Urinary incontinence 10/28/2018  . Polyuria 10/28/2018  . Dermatitis 10/28/2018  . Tinea cruris 03/25/2018  . Tick bite 03/25/2018  . Balanitis 03/25/2018  . At moderate risk for fall 12/31/2017  . Alcohol abuse 12/29/2017  . History of pulmonary embolism 12/29/2017  . Depression, recurrent ([place]) 12/29/2017  . Alcohol intoxication ([place]) 12/16/2017  . Wears hearing aid 06/09/2017  . Tinnitus of both ears 06/09/2017  . Bilateral impacted cerumen 06/09/2017  . Left wrist pain 06/26/2016  . Morning joint stiffness 06/26/2016  . Polyarthralgia 06/26/2016  . Gait disturbance 04/02/2016  . Physical deconditioning 04/02/2016  . Pain of both shoulder joints 04/02/2016  . Leg weakness, bilateral 04/02/2016  . Encounter for therapeutic drug monitoring 11/29/2015  . Encounter for health maintenance examination in adult 10/30/2015  . Shoulder pain, bilateral 10/30/2015  . Muscle weakness 10/30/2015  . Generalized weakness 10/30/2015  . Rash 10/30/2015  . PVC (premature ventricular contraction) 10/10/2015  . Decreased energy 06/20/2015  . DVT (deep venous thrombosis) ([place]) 06/20/2015  . Hammer toe of left foot 06/20/2015  . Pre-ulcerative calluses 06/20/2015  . Hypertrophic toenail 06/20/2015  . Diabetic mononeuropathy  associated with diabetes mellitus due to underlying condition ([place]) 06/20/2015  . Leg pain, bilateral 06/20/2015  . Chronic low back pain 06/20/2015  . 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 06/14/2015  . OSA on CPAP 03/06/2015  . Edema 03/06/2015  . Leg pain, diffuse 03/06/2015  . Bilateral low back pain without sciatica 03/06/2015  . Tremor 03/06/2015  . Insomnia 03/06/2015  . Vaccine counseling 03/06/2015  . Chronic back pain  03/06/2015  . Wears glasses 03/06/2015  . Hearing loss 03/06/2015  . Spinal stenosis of lumbar region 03/06/2015  . Ptosis 03/06/2015  . Positive depression screening 03/06/2015  . Advance directive discussed with patient 03/06/2015  . Bradycardia 02/27/2015  . Current use of long term anticoagulation   . Dementia without behavioral disturbance ([place])   . Essential hypertension 02/08/2015  . Hyperlipidemia 02/08/2015  . OSA (obstructive sleep apnea) 02/08/2015  . Memory loss 03/12/2013  . Diabetes mellitus ([place]) 03/12/2013    Past Surgical History:  Procedure Laterality Date  . CATARACT EXTRACTION    . COLONOSCOPY  2014  . TONSILLECTOMY         Family History  Problem Relation Age of Onset  . Dementia Father   . Clotting disorder Mother   . Heart disease Brother   . Clotting disorder Brother   . Psychiatric Illness Sister   . Leukemia Sister     Social History   Tobacco Use  . Smoking status: Former Smoker    Packs/day: 1.00    Years: 10.00    Pack years: 10.00    Types: Cigarettes    Quit date: 11/25/2004    Years since quitting: 15.2  . Smokeless tobacco: Never Used  Substance Use Topics  . Alcohol use: Yes    Alcohol/week: 1.0 standard drinks    Types: 1 Glasses of wine per week    Comment: once a month per pt   . Drug use: Not Currently    Home Medications Prior to Admission medications   Medication Sig Start Date End Date Taking? Authorizing Provider  tolterodine (DETROL LA) 4 MG 24 hr capsule Take 4 mg by mouth 2 (two) times daily. 02/18/20  Yes [provider]  diltiazem (CARDIZEM) 30 MG tablet TAKE 1 TABLET(30 MG) BY MOUTH TWICE DAILY Patient taking differently: Take 30 mg by mouth 2 (two) times daily.  01/03/20   Arnoldo Lenis, MD  donepezil (ARICEPT) 10 MG tablet Take 1 tablet (10 mg) by mouth at bedtime. 01/17/20   Lomax, Amy, NP  flecainide (TAMBOCOR) 150 MG tablet TAKE 1/2 TABLET(75 MG) BY MOUTH TWICE DAILY 10/25/19   Evans Lance,  MD  folic acid (FOLVITE) 1 MG tablet TAKE 1 TABLET(1 MG) BY MOUTH DAILY Patient taking differently: Take 1 mg by mouth daily. TAKE 1 TABLET(1 MG) BY MOUTH DAILY 07/21/19   Tysinger, Camelia Eng, PA-C  furosemide (LASIX) 40 MG tablet Take 40 mg daily AS NEEDED  For swelling. Patient taking differently: Take 40 mg by mouth daily as needed for fluid.  10/09/18   Arnoldo Lenis, MD  glucose blood test strip True Metrix test strips. Check twice daily 11/09/19   Tysinger, Camelia Eng, PA-C  memantine (NAMENDA XR) 28 MG CP24 24 hr capsule TAKE 1 CAPSULE(28 MG) BY MOUTH DAILY 08/03/19   Star Age, MD  metFORMIN (GLUCOPHAGE) 850 MG tablet TAKE 1 TABLET(850 MG) BY MOUTH TWICE DAILY WITH A MEAL Patient taking differently: Take 850 mg by mouth 2 (  two) times daily with a meal.  12/10/19   Tysinger, Camelia Eng, PA-C  Misc Natural Products (OSTEO BI-FLEX ADV JOINT SHIELD PO) Take 1 tablet by mouth daily.     [provider]  rosuvastatin (CRESTOR) 10 MG tablet TAKE 1 TABLET(10 MG) BY MOUTH AT BEDTIME Patient taking differently: Take 10 mg by mouth daily.  11/18/19   Tysinger, Camelia Eng, PA-C  tamsulosin (FLOMAX) 0.4 MG CAPS capsule TAKE 1 CAPSULE(0.4 MG) BY MOUTH DAILY Patient taking differently: Take 0.4 mg by mouth daily.  02/07/20   Tysinger, Camelia Eng, PA-C  TRESIBA FLEXTOUCH 100 UNIT/ML FlexTouch Pen ADMINISTER 41 UNITS UNDER THE SKIN DAILY AT 10 PM Patient taking differently: Inject 41 Units into the skin at bedtime.  01/31/20   Tysinger, Camelia Eng, PA-C  XARELTO 20 MG TABS tablet TAKE 1 TABLET BY MOUTH EVERY DAY WITH SUPPER Patient taking differently: Take 20 mg by mouth daily with supper.  01/17/20   Tysinger, Camelia Eng, PA-C    Allergies    Bee venom, Ultram [tramadol hcl], Oxycodone, and Oxycontin [oxycodone hcl]  Review of Systems   Review of Systems Ten systems are reviewed and are negative for acute change except as noted in the HPI  Physical Exam Updated Vital Signs BP 139/69   Pulse 68   Temp 98.9  F (37.2 C) (Oral)   Resp 11   Ht 5\' 11"  (1.803 m)   Wt 108 kg   SpO2 96%   BMI 33.19 kg/m   Physical Exam Constitutional:      General: He is not in acute distress.    Appearance: Normal appearance. He is well-developed. He is not ill-appearing or diaphoretic.  HENT:     Head: Normocephalic and atraumatic.     Right Ear: External ear normal.     Left Ear: External ear normal.     Nose: Nose normal.  Eyes:     General: Vision grossly intact. Gaze aligned appropriately.     Pupils: Pupils are equal, round, and reactive to light.  Neck:     Trachea: Trachea and phonation normal. No tracheal deviation.  Cardiovascular:     Pulses:          Dorsalis pedis pulses are 2+ on the right side and 2+ on the left side.  Pulmonary:     Effort: Pulmonary effort is normal. No respiratory distress.  Abdominal:     General: There is no distension.     Palpations: Abdomen is soft.     Tenderness: There is no abdominal tenderness. There is no guarding or rebound.  Musculoskeletal:        General: Normal range of motion.     Cervical back: Normal range of motion.     Comments: Left lower extremity shortened and externally rotated  Feet:     Right foot:     Protective Sensation: 5 sites tested. 5 sites sensed.     Left foot:     Protective Sensation: 5 sites tested. 5 sites sensed.     Comments: Motion of the toes and ankle intact without pain.  Capillary refill and sensation intact to all toes. Skin:    General: Skin is warm and dry.  Neurological:     Mental Status: He is alert.     GCS: GCS eye subscore is 4. GCS verbal subscore is 5. GCS motor subscore is 6.     Comments: Speech is clear and goal oriented, follows commands Major Cranial nerves without deficit,  no facial droop Moves extremities without ataxia, coordination intact  Psychiatric:        Behavior: Behavior normal.     ED Results / Procedures / Treatments   Labs (all labs ordered are listed, but only abnormal  results are displayed) Labs Reviewed  COMPREHENSIVE METABOLIC PANEL - Abnormal; Notable for the following components:      Result Value   Glucose, Bld 321 (*)    Calcium 8.7 (*)    All other components within normal limits  SARS CORONAVIRUS 2 (TAT 6-24 HRS)  CBC  TYPE AND SCREEN  PREPARE RBC (CROSSMATCH)  ABO/RH    EKG EKG Interpretation  Date/Time:  Friday March 03 2020 18:23:15 EDT Ventricular Rate:  65 PR Interval:    QRS Duration: 168 QT Interval:  427 QTC Calculation: 444 R Axis:   -70 Text Interpretation: Sinus rhythm RBBB and LAFB Baseline wander in lead(s) II III aVF No significant change since last tracing Confirmed by Lajean Saver [phone]) on 03/03/2020 6:33:15 PM   Radiology DG Chest 1 View  Result Date: 03/03/2020 CLINICAL DATA:  Status post fall. EXAM: CHEST  1 VIEW COMPARISON:  May 11, 2019 FINDINGS: Very mild, chronic appearing increased lung markings are seen without evidence of acute infiltrate, pleural effusion or pneumothorax. The heart size and mediastinal contours are within normal limits. The visualized skeletal structures are unremarkable. IMPRESSION: No active disease. Electronically Signed   By: Virgina Norfolk M.D.   On: 03/03/2020 19:34   CT Head Wo Contrast  Result Date: 03/03/2020 CLINICAL DATA:  Fall, hit head EXAM: CT HEAD WITHOUT CONTRAST TECHNIQUE: Contiguous axial images were obtained from the base of the skull through the vertex without intravenous contrast. COMPARISON:  12/16/2017 FINDINGS: Brain: Age related volume loss. No acute intracranial abnormality. Specifically, no hemorrhage, hydrocephalus, mass lesion, acute infarction, or significant intracranial injury. Vascular: No hyperdense vessel or unexpected calcification. Skull: No acute calvarial abnormality. Sinuses/Orbits: Visualized paranasal sinuses and mastoids clear. Orbital soft tissues unremarkable. Other: None IMPRESSION: No acute intracranial abnormality. Electronically Signed   By:  Rolm Baptise M.D.   On: 03/03/2020 19:05   CT Cervical Spine Wo Contrast  Result Date: 03/03/2020 CLINICAL DATA:  Fall, hit head EXAM: CT CERVICAL SPINE WITHOUT CONTRAST TECHNIQUE: Multidetector CT imaging of the cervical spine was performed without intravenous contrast. Multiplanar CT image reconstructions were also generated. COMPARISON:  None. FINDINGS: Alignment: Normal Skull base and vertebrae: No acute fracture. No primary bone lesion or focal pathologic process. Soft tissues and spinal canal: No prevertebral fluid or swelling. No visible canal hematoma. Disc levels:  Advanced diffuse degenerative disc and facet disease. Upper chest: No acute findings Other: None IMPRESSION: Advanced diffuse degenerative disc and facet disease. No acute bony abnormality. Electronically Signed   By: Rolm Baptise M.D.   On: 03/03/2020 19:04   DG HIP UNILAT W OR W/O PELVIS 2-3 VIEWS LEFT  Result Date: 03/03/2020 CLINICAL DATA:  79 year old male with fall and left hip pain. EXAM: DG HIP (WITH OR WITHOUT PELVIS) 2-3V LEFT COMPARISON:  Pelvic radiograph dated 11/02/2015 FINDINGS: There is a comminuted, displaced and angulated intertrochanteric fracture of the left femoral neck. There is mild elevation of the femoral shaft and impaction. There is no dislocation. The bones are osteopenic. The soft tissues are unremarkable. IMPRESSION: Comminuted, displaced and angulated intertrochanteric fracture of the left femoral neck. Electronically Signed   By: Anner Crete M.D.   On: 03/03/2020 19:35    Procedures Procedures (including critical care time)  Medications Ordered in ED Medications  0.9 %  sodium chloride infusion (Manually program via Guardrails IV Fluids) (has no administration in time range)  morphine 2 MG/ML injection 2 mg (2 mg Intravenous Given 03/03/20 1824)    ED Course  I have reviewed the triage vital signs and the nursing notes.  Pertinent labs & imaging results that were available during my care of  the patient were reviewed by me and considered in my medical decision making (see chart for details).  Clinical Course as of Mar 03 2118  Fri Mar 03, 2020  1943 Dr. Aline Brochure   [BM]  1947 [name]   [BM]  1952 Dr. Nehemiah Settle   [BM]    Clinical Course User Index [BM] Gari Crown   MDM Rules/Calculators/A&P                     79 year old male with history as detailed above on Xarelto presents today after fall onto his left hip also striking his head.  On arrival he is fully alert and oriented well-appearing no acute distress he does have a externally rotated and shortened left lower extremity with tenderness of the left hip concerning for fracture.  Will obtain CT head to assess for intracranial hemorrhage in this patient she is on blood thinners, will additionally obtain CT cervical spine, chest x-ray and x-ray of the pelvis.  Basic blood work including CBC, type and screen, BMP and screening Covid test ordered as suspect patient will likely need admission for fracture. - I have reviewed and interpreted patient's blood work today.  CMP shows elevation of glucose at 321, no emergent electrolyte abnormalities, evidence of kidney injury or acute elevation of LFTs.  CBC within normal limits no leukocytosis to suggest infection or evidence of anemia.  Type and screen B negative.  CXR:  IMPRESSION:  No active disease.    I have reviewed and interpreted patient's chest x-ray and agree with radiologist interpretation.  DG hip/pelvis:  IMPRESSION:  Comminuted, displaced and angulated intertrochanteric fracture of  the left femoral neck.  I have reviewed and interpreted patient's x-ray above, I agree with radiology interpretation, left femoral neck fracture correlates with exam.  CT head:    IMPRESSION:  No acute intracranial abnormality.  I have reviewed patient's CT head, agree with radiologist interpretation.  No obvious intracranial hemorrhage.  CT cervical spine:  IMPRESSION:   Advanced diffuse degenerative disc and facet disease. No acute bony  abnormality.  I have personally reviewed patient's CT cervical spine.  I agree with radiologist interpretation.  No obvious C-spine fracture.  EKG: Sinus rhythm RBBB and LAFB Baseline wander in lead(s) II III aVF No significant change since last tracing Confirmed by Lajean Saver [phone]) on 03/03/2020 6:33:15 PM - Patient reports pain improved following morphine.  I discussed the results of his imaging today and he stated understanding.  He is agreeable for admission at this time and asked that I relay information to his wife [name]. - 7:43 PM: Discussed the case with orthopedist Dr. Aline Brochure, he is asked for hospitalist admission. - 7:47 PM: Updated patient's wife [name], she states understanding of diagnosis and plan of care and is agreeable.  She reports that she will be given her phone on her throughout the night for an additional information however wants to avoid coming to the hospital due to COVID-19 pandemic. - 7:52 PM: Discussed case with hospitalist Dr. Sheran Lawless who is accepted patient to his service.  Patient was seen and  evaluated by Dr. Ashok Cordia during this visit who agrees with plan of care.  Note: Portions of this report may have been transcribed using voice recognition software. Every effort was made to ensure accuracy; however, inadvertent computerized transcription errors may still be present. Final Clinical Impression(s) / ED Diagnoses Final diagnoses:  Left displaced femoral neck fracture [facility])    Rx / DC Orders ED Discharge Orders    None       Gari Crown 03/03/20 2119    Lajean Saver, MD 03/03/20 2358

## 2020-03-03 NOTE — ED Triage Notes (Signed)
Arrived by EMS; pt hasn't been able to ambulated very well for several years, attempted to stand up out of chair and fell on left hip; pt states he hit head but did not lose conciousness. Pt has pain in left hip; pt takes a blood thinner

## 2020-03-04 DIAGNOSIS — D6852 Prothrombin gene mutation: Secondary | ICD-10-CM

## 2020-03-04 DIAGNOSIS — F1010 Alcohol abuse, uncomplicated: Secondary | ICD-10-CM

## 2020-03-04 DIAGNOSIS — N400 Enlarged prostate without lower urinary tract symptoms: Secondary | ICD-10-CM

## 2020-03-04 DIAGNOSIS — I10 Essential (primary) hypertension: Secondary | ICD-10-CM

## 2020-03-04 DIAGNOSIS — Z86711 Personal history of pulmonary embolism: Secondary | ICD-10-CM

## 2020-03-04 DIAGNOSIS — Z8673 Personal history of transient ischemic attack (TIA), and cerebral infarction without residual deficits: Secondary | ICD-10-CM

## 2020-03-04 DIAGNOSIS — S72002A Fracture of unspecified part of neck of left femur, initial encounter for closed fracture: Secondary | ICD-10-CM

## 2020-03-04 DIAGNOSIS — Z8781 Personal history of (healed) traumatic fracture: Secondary | ICD-10-CM

## 2020-03-04 DIAGNOSIS — Z794 Long term (current) use of insulin: Secondary | ICD-10-CM

## 2020-03-04 DIAGNOSIS — E1159 Type 2 diabetes mellitus with other circulatory complications: Secondary | ICD-10-CM

## 2020-03-04 DIAGNOSIS — S72142A Displaced intertrochanteric fracture of left femur, initial encounter for closed fracture: Secondary | ICD-10-CM | POA: Diagnosis not present

## 2020-03-04 DIAGNOSIS — F0390 Unspecified dementia without behavioral disturbance: Secondary | ICD-10-CM

## 2020-03-04 LAB — CBC
HCT: 38.3 % — ABNORMAL LOW (ref 39.0–52.0)
Hemoglobin: 12 g/dL — ABNORMAL LOW (ref 13.0–17.0)
MCH: 29.7 pg (ref 26.0–34.0)
MCHC: 31.3 g/dL (ref 30.0–36.0)
MCV: 94.8 fL (ref 80.0–100.0)
Platelets: 249 10*3/uL (ref 150–400)
RBC: 4.04 MIL/uL — ABNORMAL LOW (ref 4.22–5.81)
RDW: 12.5 % (ref 11.5–15.5)
WBC: 11.6 10*3/uL — ABNORMAL HIGH (ref 4.0–10.5)
nRBC: 0 % (ref 0.0–0.2)

## 2020-03-04 LAB — PROTIME-INR
INR: 1.2 (ref 0.8–1.2)
Prothrombin Time: 14.6 seconds (ref 11.4–15.2)

## 2020-03-04 LAB — BASIC METABOLIC PANEL
Anion gap: 12 (ref 5–15)
BUN: 17 mg/dL (ref 8–23)
CO2: 23 mmol/L (ref 22–32)
Calcium: 8.8 mg/dL — ABNORMAL LOW (ref 8.9–10.3)
Chloride: 102 mmol/L (ref 98–111)
Creatinine, Ser: 0.75 mg/dL (ref 0.61–1.24)
GFR calc Af Amer: >60 mL/min (ref >60)
GFR calc non Af Amer: >60 mL/min (ref >60)
Glucose, Bld: 296 mg/dL — ABNORMAL HIGH (ref 70–99)
Potassium: 4.3 mmol/L (ref 3.5–5.1)
Sodium: 137 mmol/L (ref 135–145)

## 2020-03-04 LAB — MRSA PCR SCREENING: MRSA by PCR: NEGATIVE

## 2020-03-04 LAB — SARS CORONAVIRUS 2 (TAT 6-24 HRS): SARS Coronavirus 2: NEGATIVE

## 2020-03-04 LAB — GLUCOSE, CAPILLARY
Glucose-Capillary: 297 mg/dL — ABNORMAL HIGH (ref 70–99)
Glucose-Capillary: 291 mg/dL — ABNORMAL HIGH (ref 70–99)
Glucose-Capillary: 246 mg/dL — ABNORMAL HIGH (ref 70–99)
Glucose-Capillary: 288 mg/dL — ABNORMAL HIGH (ref 70–99)

## 2020-03-04 MED ORDER — POVIDONE-IODINE 10 % EX SWAB: 2.0000 "application " | Freq: Once | CUTANEOUS | Status: DC

## 2020-03-04 MED ORDER — CHLORHEXIDINE GLUCONATE 4 % EX LIQD
60.0000 mL | Freq: Once | CUTANEOUS | Status: AC
  Administered 2020-03-05: 4 via TOPICAL
  Filled 2020-03-04: qty 60

## 2020-03-04 MED ORDER — CEFAZOLIN SODIUM-DEXTROSE 2-4 GM/100ML-% IV SOLN
2.0000 g | INTRAVENOUS | Status: AC
  Administered 2020-03-05: 10:00:00 2 g via INTRAVENOUS
  Filled 2020-03-04: qty 100

## 2020-03-04 MED ORDER — INSULIN DETEMIR 100 UNIT/ML ~~LOC~~ SOLN
65.0000 [IU] | Freq: Every day | SUBCUTANEOUS | Status: DC
  Administered 2020-03-04 – 2020-03-06 (×3): 65 [IU] via SUBCUTANEOUS
  Filled 2020-03-04 (×4): qty 0.65

## 2020-03-04 NOTE — Anesthesia Preprocedure Evaluation (Addendum)
Anesthesia Evaluation  Patient identified by MRN, date of birth, ID band Patient awake    Reviewed: Allergy & Precautions, NPO status , Patient's Chart, lab work & pertinent test results  History of Anesthesia Complications Negative for: history of anesthetic complications  Airway Mallampati: III  TM Distance: >3 FB Neck ROM: Full    Dental  (+) Missing, Dental Advisory Given   Pulmonary sleep apnea and Continuous Positive Airway Pressure Ventilation , former smoker, PE   Pulmonary exam normal breath sounds clear to auscultation       Cardiovascular Exercise Tolerance: Poor hypertension, Pt. on medications + DVT  Normal cardiovascular exam Rhythm:Regular Rate:Normal - Systolic murmurs, - Diastolic murmurs, - Friction Rub, - Carotid Bruit, - Peripheral Edema and - Systolic Click [id number] [facility]-ER ROUTINE RECORD Sinus rhythm RBBB and LAFB Baseline wander in lead(s) II III aVF No significant change since last tracing Confirmed by Lajean Saver [phone]) on 03/03/2020 6:33:15 PM 10mm/s 58mm/mV 150Hz  9.0.4 CID: [id number] Confirmed By: Lajean Saver   Neuro/Psych PSYCHIATRIC DISORDERS Depression Dementia  Neuromuscular disease    GI/Hepatic negative GI ROS, (+)     substance abuse  alcohol use,   Endo/Other  diabetes, Poorly Controlled, Type 2, Oral Hypoglycemic Agents, Insulin Dependent  Renal/GU      Musculoskeletal  (+) Arthritis  (back pain), Osteoarthritis,    Abdominal   Peds  Hematology   Anesthesia Other Findings Echo- 12/18/2017  - Left ventricle: The cavity size was normal. Wall thickness was increased in a pattern of mild LVH. Systolic function was normal.  The estimated ejection fraction was in the range of 60% to 65%.  Wall motion was normal; there were no regional wall motion  abnormalities. Left ventricular diastolic function parameters were normal.  - Aortic valve:  Mildly calcified annulus. Trileaflet; normal thickness leaflets. Valve area (VTI): 2.26 cm^2. Valve area  (Vmax): 2.04 cm^2. Valve area (Vmean): 1.99 cm^2.  - Technically adequate study.   Reproductive/Obstetrics                         Anesthesia Physical Anesthesia Plan  ASA: IV  Anesthesia Plan: General   Post-op Pain Management:    Induction: Intravenous  PONV Risk Score and Plan: 3 and Ondansetron and Treatment may vary due to age or medical condition  Airway Management Planned: Oral ETT and Fiberoptic Intubation Planned  Additional Equipment:   Intra-op Plan:   Post-operative Plan: Extubation in OR  Informed Consent: I have reviewed the patients History and Physical, chart, labs and discussed the procedure including the risks, benefits and alternatives for the proposed anesthesia with the patient or authorized representative who has indicated his/her understanding and acceptance.   Patient has DNR.  Discussed DNR with power of attorney.   Dental advisory given and Consent reviewed with POA  Plan Discussed with: Surgeon  Anesthesia Plan Comments: (Discussed with wife regarding DNR status,  In case of cardiac arrest, agreed to get brief resuscitation, does not want prolonged resuscitation measures.   )     Anesthesia Quick Evaluation

## 2020-03-04 NOTE — H&P (View-Only) (Signed)
Christian [name]   Patient ID: Christian [name], male   DOB: 04-29-1941, 79 y.o.   MRN: [id number]  New patient  Requested by: Dr. Barton Dubois  Reason for: Left hip fracture  Based on the information below I recommend internal fixation left hip*  Chief Complaint  Patient presents with  . Fall     HPI  79 year old male on Xarelto for prior blood clot pulmonary embolism back in 2016 currently maintained on Xarelto because he has a prothrombin pro clotting condition.  He has been stable walking at home with a cane but is mainly a household minimal ambulator.  Pain is located left hip started on April 8 last Xarelto was April 8 Location left hip Duration April 8 Severity 10 Quality Sharp if moved Modified by increased with movement  Review of Systems (all) Review of Systems  Constitutional: Negative for chills, fever, malaise/fatigue and weight loss.  Respiratory: Negative for shortness of breath.   Cardiovascular: Negative for chest pain.  Musculoskeletal: Positive for joint pain.  Neurological: Positive for weakness. Negative for tingling, tremors and sensory change.  All other systems reviewed and are negative.   Past Medical History:  Diagnosis Date  . Agitation   . Arthritis   . Cataract   . Chronic back pain   . Confusion   . Diabetes ([place])    type 2  . DVT (deep venous thrombosis) ([place]) 01/2015  . Hearing impaired   . High cholesterol   . Hypertension   . Insomnia   . Insomnia   . Leg pain, diffuse   . Leg swelling   . Memory loss   . Mild cognitive impairment    sees [facility]  . Numbness    fingers, feet, toes  . OSA on CPAP   . 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 06/14/2015  . Pulmonary embolism ([place]) 01/2015  . Spinal stenosis    lumbar  . Tremor    on propranolol  . Wears glasses     Past Surgical History:  Procedure Laterality Date  . CATARACT EXTRACTION     . COLONOSCOPY  2014  . TONSILLECTOMY      Family History  Problem Relation Age of Onset  . Dementia Father   . Clotting disorder Mother   . Heart disease Brother   . Clotting disorder Brother   . Psychiatric Illness Sister   . Leukemia Sister    Social History   Tobacco Use  . Smoking status: Former Smoker    Packs/day: 1.00    Years: 10.00    Pack years: 10.00    Types: Cigarettes    Quit date: 11/25/2004    Years since quitting: 15.2  . Smokeless tobacco: Never Used  Substance Use Topics  . Alcohol use: Yes    Alcohol/week: 1.0 standard drinks    Types: 1 Glasses of wine per week    Comment: once a month per pt   . Drug use: Not Currently   Allergies  Allergen Reactions  . Bee Venom Swelling  . Ultram [Tramadol Hcl]     Makes him wired, gives insomnia  . Oxycodone Other (See Comments)    Mental status changes  . Oxycontin [Oxycodone Hcl] Other (See Comments)    Mental status changes per pt    Current Facility-Administered Medications:  .  diltiazem (CARDIZEM) tablet 30 mg, 30 mg, Oral, BID, Truett Mainland, DO, 30 mg at 03/04/20 1023 .  docusate sodium (COLACE) capsule 100 mg, 100 mg, Oral, BID, Truett Mainland, DO, 100 mg at 03/04/20 1024 .  donepezil (ARICEPT) tablet 10 mg, 10 mg, Oral, QHS, Truett Mainland, DO, 10 mg at 03/03/20 2329 .  fesoterodine (TOVIAZ) tablet 8 mg, 8 mg, Oral, Daily, Truett Mainland, DO, 8 mg at 03/04/20 1024 .  flecainide (TAMBOCOR) tablet 75 mg, 75 mg, Oral, Q12H, Truett Mainland, DO, 75 mg at 03/04/20 1026 .  HYDROcodone-acetaminophen (NORCO/VICODIN) 5-325 MG per tablet 1-2 tablet, 1-2 tablet, Oral, Q4H PRN, Truett Mainland, DO, 2 tablet at 03/04/20 1026 .  insulin aspart (novoLOG) injection 0-20 Units, 0-20 Units, Subcutaneous, TID WC, Truett Mainland, DO, 11 Units at 03/04/20 1024 .  insulin aspart (novoLOG) injection 0-5 Units, 0-5 Units, Subcutaneous, QHS, Truett Mainland, DO, 3 Units at 03/03/20 2333 .  insulin detemir  (LEVEMIR) injection 60 Units, 60 Units, Subcutaneous, QHS, Truett Mainland, DO, 60 Units at 03/04/20 0005 .  memantine (NAMENDA XR) 24 hr capsule 28 mg, 28 mg, Oral, Daily, Truett Mainland, DO, 28 mg at 03/04/20 1025 .  ondansetron (ZOFRAN) tablet 4 mg, 4 mg, Oral, Q6H PRN **OR** ondansetron (ZOFRAN) injection 4 mg, 4 mg, Intravenous, Q6H PRN, Stinson, Jacob J, DO .  polyethylene glycol (MIRALAX / GLYCOLAX) packet 17 g, 17 g, Oral, Daily PRN, Truett Mainland, DO .  rosuvastatin (CRESTOR) tablet 10 mg, 10 mg, Oral, Daily, Truett Mainland, DO, 10 mg at 03/04/20 1024 .  tamsulosin (FLOMAX) capsule 0.4 mg, 0.4 mg, Oral, Daily, Truett Mainland, DO, 0.4 mg at 03/04/20 1023    Physical Exam(=30) BP 133/62 (BP Location: Right Arm)   Pulse 78   Temp 98.1 F (36.7 C)   Resp 18   Ht 5\' 11"  (1.803 m)   Wt 107.5 kg   SpO2 94%   BMI 33.05 kg/m   Gen. Appearance good height weight distribution no deformity other than external rotation of the left lower extremity Peripheral vascular system normal color temperature no edema Lymph nodes groin negative Gait unable to walk secondary to fracture  Left Upper extremity  Inspection revealed no malalignment or asymmetry  Assessment of range of motion: Full range of motion was recorded  Assessment of stability: Elbow wrist and hand and shoulder were stable  Assessment of muscle strength and tone revealed grade 5 muscle strength and normal muscle tone  Skin was normal without rash lesion or ulceration  Right upper extremity  Inspection revealed no malalignment or asymmetry  Assessment of range of motion: Full range of motion was recorded  Assessment of stability: Elbow wrist and hand and shoulder were stable  Assessment of muscle strength and tone revealed grade 5 muscle strength and normal muscle tone  Skin was normal without rash lesion or ulceration  Right Lower extremity  Inspection revealed no malalignment or asymmetry  Assessment of range  of motion: Full range of motion was recorded  Assessment of stability: Ankle, knee and hip were stable  Assessment of muscle strength and tone revealed grade 5 muscle strength and normal muscle tone  Skin was normal without rash lesion or ulceration  Left lower extremity Inspection revealed external rotation  assessment of range of motion: Deferred pain  Assessment of stability: Deferred pain  Assessment of muscle strength and tone normal muscle tone skin was normal without rash lesion or ulceration  Coordination was tested by finger-to-nose nose and was normal Deep tendon reflexes were 2+ in the upper extremities  Examination of sensation by touch was normal  Mental status  Oriented to time person and place normal  Mood and affect normal without depression anxiety or agitation  Dx: intertroch fracture left hip   Data Reviewed x-rays pelvis and hip 3 if not 4 part fracture left hip is an intertrochanteric type fracture small lesser trochanteric fragment  ER RECORD REVIEWED: CONFIRMS HISTORY   I reviewed the following images and the reports and my independent interpretation is as noted above   Assessment  79 year old male on Xarelto history of PE and DVT fell and broke his left hip minimal household ambulator with a cane.  Moderate risk primarily due to coagulation issues however, being off the Xarelto with his prothrombin genetic defect should not be an issue for bleeding although we have ordered 3 units of blood  Discussed with medicine all agree patient is stable as he can be for surgery and we can resume his anticoagulation after surgery with the following protocol:  After surgery we will get a hemoglobin 6 hours afterwards if it looks stable and we did not lose a lot of blood we will restart the Xarelto.  Plan   As above surgery Sunday 9 AM   Carole Civil MD

## 2020-03-04 NOTE — Plan of Care (Signed)
  Problem: Education: Goal: Knowledge of General Education information will improve Description: Including pain rating scale, medication(s)/side effects and non-pharmacologic comfort measures Outcome: Progressing   Problem: Clinical Measurements: Goal: Ability to maintain clinical measurements within normal limits will improve Outcome: Progressing Goal: Will remain free from infection Outcome: Progressing   

## 2020-03-04 NOTE — Consult Note (Signed)
[place]   Patient ID: Christian [name], male   DOB: Jan 06, 1941, 79 y.o.   MRN: [id number]  New patient  Requested by: Dr. Barton Dubois  Reason for: Left hip fracture  Based on the information below I recommend internal fixation left hip*  Chief Complaint  Patient presents with  . Fall     HPI  79 year old male on Xarelto for prior blood clot pulmonary embolism back in 2016 currently maintained on Xarelto because he has a prothrombin pro clotting condition.  He has been stable walking at home with a cane but is mainly a household minimal ambulator.  Pain is located left hip started on April 8 last Xarelto was April 8 Location left hip Duration April 8 Severity 10 Quality Sharp if moved Modified by increased with movement  Review of Systems (all) Review of Systems  Constitutional: Negative for chills, fever, malaise/fatigue and weight loss.  Respiratory: Negative for shortness of breath.   Cardiovascular: Negative for chest pain.  Musculoskeletal: Positive for joint pain.  Neurological: Positive for weakness. Negative for tingling, tremors and sensory change.  All other systems reviewed and are negative.   Past Medical History:  Diagnosis Date  . Agitation   . Arthritis   . Cataract   . Chronic back pain   . Confusion   . Diabetes ([place])    type 2  . DVT (deep venous thrombosis) ([place]) 01/2015  . Hearing impaired   . High cholesterol   . Hypertension   . Insomnia   . Insomnia   . Leg pain, diffuse   . Leg swelling   . Memory loss   . Mild cognitive impairment    sees [facility]  . Numbness    fingers, feet, toes  . OSA on CPAP   . 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 06/14/2015  . Pulmonary embolism ([place]) 01/2015  . Spinal stenosis    lumbar  . Tremor    on propranolol  . Wears glasses     Past Surgical History:  Procedure Laterality Date  . CATARACT EXTRACTION     . COLONOSCOPY  2014  . TONSILLECTOMY      Family History  Problem Relation Age of Onset  . Dementia Father   . Clotting disorder Mother   . Heart disease Brother   . Clotting disorder Brother   . Psychiatric Illness Sister   . Leukemia Sister    Social History   Tobacco Use  . Smoking status: Former Smoker    Packs/day: 1.00    Years: 10.00    Pack years: 10.00    Types: Cigarettes    Quit date: 11/25/2004    Years since quitting: 15.2  . Smokeless tobacco: Never Used  Substance Use Topics  . Alcohol use: Yes    Alcohol/week: 1.0 standard drinks    Types: 1 Glasses of wine per week    Comment: once a month per pt   . Drug use: Not Currently   Allergies  Allergen Reactions  . Bee Venom Swelling  . Ultram [Tramadol Hcl]     Makes him wired, gives insomnia  . Oxycodone Other (See Comments)    Mental status changes  . Oxycontin [Oxycodone Hcl] Other (See Comments)    Mental status changes per pt    Current Facility-Administered Medications:  .  diltiazem (CARDIZEM) tablet 30 mg, 30 mg, Oral, BID, Truett Mainland, DO, 30 mg at 03/04/20 1023 .  docusate sodium (COLACE) capsule 100 mg, 100 mg, Oral, BID, Truett Mainland, DO, 100 mg at 03/04/20 1024 .  donepezil (ARICEPT) tablet 10 mg, 10 mg, Oral, QHS, Truett Mainland, DO, 10 mg at 03/03/20 2329 .  fesoterodine (TOVIAZ) tablet 8 mg, 8 mg, Oral, Daily, Truett Mainland, DO, 8 mg at 03/04/20 1024 .  flecainide (TAMBOCOR) tablet 75 mg, 75 mg, Oral, Q12H, Truett Mainland, DO, 75 mg at 03/04/20 1026 .  HYDROcodone-acetaminophen (NORCO/VICODIN) 5-325 MG per tablet 1-2 tablet, 1-2 tablet, Oral, Q4H PRN, Truett Mainland, DO, 2 tablet at 03/04/20 1026 .  insulin aspart (novoLOG) injection 0-20 Units, 0-20 Units, Subcutaneous, TID WC, Truett Mainland, DO, 11 Units at 03/04/20 1024 .  insulin aspart (novoLOG) injection 0-5 Units, 0-5 Units, Subcutaneous, QHS, Truett Mainland, DO, 3 Units at 03/03/20 2333 .  insulin detemir  (LEVEMIR) injection 60 Units, 60 Units, Subcutaneous, QHS, Truett Mainland, DO, 60 Units at 03/04/20 0005 .  memantine (NAMENDA XR) 24 hr capsule 28 mg, 28 mg, Oral, Daily, Truett Mainland, DO, 28 mg at 03/04/20 1025 .  ondansetron (ZOFRAN) tablet 4 mg, 4 mg, Oral, Q6H PRN **OR** ondansetron (ZOFRAN) injection 4 mg, 4 mg, Intravenous, Q6H PRN, Stinson, Jacob J, DO .  polyethylene glycol (MIRALAX / GLYCOLAX) packet 17 g, 17 g, Oral, Daily PRN, Truett Mainland, DO .  rosuvastatin (CRESTOR) tablet 10 mg, 10 mg, Oral, Daily, Truett Mainland, DO, 10 mg at 03/04/20 1024 .  tamsulosin (FLOMAX) capsule 0.4 mg, 0.4 mg, Oral, Daily, Truett Mainland, DO, 0.4 mg at 03/04/20 1023    Physical Exam(=30) BP 133/62 (BP Location: Right Arm)   Pulse 78   Temp 98.1 F (36.7 C)   Resp 18   Ht 5\' 11"  (1.803 m)   Wt 107.5 kg   SpO2 94%   BMI 33.05 kg/m   Gen. Appearance good height weight distribution no deformity other than external rotation of the left lower extremity Peripheral vascular system normal color temperature no edema Lymph nodes groin negative Gait unable to walk secondary to fracture  Left Upper extremity  Inspection revealed no malalignment or asymmetry  Assessment of range of motion: Full range of motion was recorded  Assessment of stability: Elbow wrist and hand and shoulder were stable  Assessment of muscle strength and tone revealed grade 5 muscle strength and normal muscle tone  Skin was normal without rash lesion or ulceration  Right upper extremity  Inspection revealed no malalignment or asymmetry  Assessment of range of motion: Full range of motion was recorded  Assessment of stability: Elbow wrist and hand and shoulder were stable  Assessment of muscle strength and tone revealed grade 5 muscle strength and normal muscle tone  Skin was normal without rash lesion or ulceration  Right Lower extremity  Inspection revealed no malalignment or asymmetry  Assessment of range  of motion: Full range of motion was recorded  Assessment of stability: Ankle, knee and hip were stable  Assessment of muscle strength and tone revealed grade 5 muscle strength and normal muscle tone  Skin was normal without rash lesion or ulceration  Left lower extremity Inspection revealed external rotation  assessment of range of motion: Deferred pain  Assessment of stability: Deferred pain  Assessment of muscle strength and tone normal muscle tone skin was normal without rash lesion or ulceration  Coordination was tested by finger-to-nose nose and was normal Deep tendon reflexes were 2+ in the upper extremities  Examination of sensation by touch was normal  Mental status  Oriented to time person and place normal  Mood and affect normal without depression anxiety or agitation  Dx: intertroch fracture left hip   Data Reviewed x-rays pelvis and hip 3 if not 4 part fracture left hip is an intertrochanteric type fracture small lesser trochanteric fragment  ER RECORD REVIEWED: CONFIRMS HISTORY   I reviewed the following images and the reports and my independent interpretation is as noted above   Assessment  79 year old male on Xarelto history of PE and DVT fell and broke his left hip minimal household ambulator with a cane.  Moderate risk primarily due to coagulation issues however, being off the Xarelto with his prothrombin genetic defect should not be an issue for bleeding although we have ordered 3 units of blood  Discussed with medicine all agree patient is stable as he can be for surgery and we can resume his anticoagulation after surgery with the following protocol:  After surgery we will get a hemoglobin 6 hours afterwards if it looks stable and we did not lose a lot of blood we will restart the Xarelto.  Plan   As above surgery Sunday 9 AM   Carole Civil MD

## 2020-03-04 NOTE — Progress Notes (Signed)
PROGRESS NOTE    Christian [name]  [id number] DOB: 1941/04/18 DOA: 03/03/2020 PCP: Carlena Hurl, PA-C     Brief Narrative:  As per H&P written by Dr. Nehemiah Settle on 03/03/2020 79 y.o. male with a history of PE and DVT secondary to prothrombin gene, currently on anticoagulation, type 2 diabetes with poor control, hypertension, dementia, depression.  Patient seen for fall that occurred earlier today.  He was attempting to ambulate and fell when he tried to get out of his chair.  He landed on his left hip and had immediate pain that is nonradiating.  Pain worse with movement and improved with rest.  EMS was called and the patient was taken to the hospital for evaluation.  In conversing with wife, the patient has "given up on life".  He has had limited mobility, eats what he wants, does not take check his blood sugars.  Patient mostly sits in chair or is in bed.  He has had severe deconditioning has been ongoing for the past few months.  Emergency Department Course: Hip x-ray shows left femoral neck fracture that is comminuted, displaced, and angulated.  Orthopedics has been consulted   Assessment & Plan: 1-s/p left hip fracture: Following mechanical fall prior to admission. -Continue holding chronic anticoagulation for anticipated surgical intervention and internal fixation on 03/05/2020 -Continue analgesics as needed -Continue supportive care. -Orthopedic service (Dr. Aline Brochure) is on board; will follow further recommendations.  2-uncontrolled type 2 diabetes with hyperglycemia -Continue holding oral hypoglycemic agents while inpatient -Continue sliding scale insulin and Lantus -Follow CBGs and further adjust hypoglycemic regimen as required based on CBGs fluctuation.  3-Essential hypertension -Overall stable and well-controlled -Continue current antihypertensive regimens.  4-Dementia without behavioral disturbance (HCC) -Continue Aricept and Namenda -Constant reorientation -Stable  mood currently.  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 -After surgical intervention. -Continue current rate control/antiarrhythmic agents. -Continue monitoring on telemetry.  6-Alcohol abuse -No prior history of withdrawal -Continue close monitoring.  7-History of ulcer nonhemorrhagic stroke -No focal deficits appreciated -Continue risk factor modifications and resumption of anticoagulation for secondary prevention after surgical intervention.  8-Benign prostatic hyperplasia with lower urinary tract symptoms -No complaints of urinary retention -Continue Flomax.     DVT prophylaxis: SCDs Code Status: DNR Family Communication: No family at bedside. Disposition Plan: Remains inpatient, continue supportive care and as needed analgesics; anticipated internal fixation of his left hip on 03/05/2020.  Patient remains at high risk for surgery given underlying comorbidities and previous long-term anticoagulation, but at this moment benefits outweigh risk.  Anticipate most likely discharge to skilled nursing facility after surgical intervention for rehabilitation, conditioning and further care.  Consultants:   Orthopedic service (Dr. Aline Brochure).  Procedures:   Anticipated left hip surgery for internal fixation on 03/05/2020.  Antimicrobials:  Anti-infectives (From admission, onward)   None       Subjective: Reports pain on his left hip with movement; no chest pain, no palpitations, no nausea, no vomiting.  Patient is afebrile.  Objective: Vitals:   03/03/20 2214 03/04/20 0214 03/04/20 0613 03/04/20 1419  BP: 127/61 139/71 133/62 (!) 142/72  Pulse: 72 70 78 91  Resp: 20 20 18 20   Temp: 98.5 F (36.9 C) 99.1 F (37.3 C) 98.1 F (36.7 C) 98.7 F (37.1 C)  TempSrc: Oral Oral    SpO2: 97% 96% 94% 95%  Weight: 107.5 kg     Height: 5\' 11"  (1.803 m)  Intake/Output Summary (Last 24 hours)  at 03/04/2020 1805 Last data filed at 03/04/2020 1030 Gross per 24 hour  Intake --  Output 300 ml  Net -300 ml   Filed Weights   03/03/20 1750 03/03/20 2214  Weight: 108 kg 107.5 kg    Examination:  General exam: Alert, awake, oriented x 2; no fever, no chest pain, no nausea, no vomiting.  Patient reports pain while trying to move his left hip. Respiratory system: Clear to auscultation. Respiratory effort normal. Cardiovascular system: Rate controlled.  No rubs, no gallops, no JVD.[name] Gastrointestinal system: Abdomen is nondistended, soft and nontender. No organomegaly or masses felt. Normal bowel sounds heard. Central nervous system: Alert and oriented. No focal neurological deficits. Extremities: No cyanosis or clubbing; left hip external rotation appreciated on exam. Skin: No rashes, no petechiae. Psychiatry: Mood & affect appropriate.     Data Reviewed: I have personally reviewed following labs and imaging studies  CBC: Recent Labs  Lab 03/03/20 1811 03/04/20 0649  WBC 9.7 11.6*  HGB 14.1 12.0*  HCT 44.8 38.3*  MCV 94.5 94.8  PLT 227 0000000   Basic Metabolic Panel: Recent Labs  Lab 03/03/20 1811 03/04/20 0649  NA 136 137  K 4.4 4.3  CL 104 102  CO2 23 23  GLUCOSE 321* 296*  BUN 17 17  CREATININE 0.88 0.75  CALCIUM 8.7* 8.8*   GFR: Estimated Creatinine Clearance: 98 mL/min (by C-G formula based on SCr of 0.75 mg/dL).   Liver Function Tests: Recent Labs  Lab 03/03/20 1811  AST 17  ALT 15  ALKPHOS 86  BILITOT 0.5  PROT 6.7  ALBUMIN 3.5   Coagulation Profile: Recent Labs  Lab 03/04/20 0649  INR 1.2   CBG: Recent Labs  Lab 03/03/20 2304 03/04/20 0817 03/04/20 1136 03/04/20 1653  GLUCAP 254* 297* 291* 246*   Urine analysis:    Component Value Date/Time   COLORURINE YELLOW 05/12/2019 0008   APPEARANCEUR CLEAR 05/12/2019 0008   LABSPEC 1.024 05/12/2019 0008   LABSPEC 1.015 10/28/2018 1522   PHURINE 5.0 05/12/2019 0008   GLUCOSEU >=500 (A)  05/12/2019 0008   HGBUR NEGATIVE 05/12/2019 0008   BILIRUBINUR NEGATIVE 05/12/2019 0008   BILIRUBINUR negative 10/28/2018 1522   BILIRUBINUR n 05/10/2016 1500   KETONESUR 5 (A) 05/12/2019 0008   PROTEINUR NEGATIVE 05/12/2019 0008   UROBILINOGEN negative 05/10/2016 1500   UROBILINOGEN 0.2 01/31/2015 1505   NITRITE NEGATIVE 05/12/2019 0008   LEUKOCYTESUR NEGATIVE 05/12/2019 0008    Recent Results (from the past 240 hour(s))  MRSA PCR Screening     Status: None   Collection Time: 03/04/20 11:31 AM   Specimen: Nasal Mucosa; Nasopharyngeal  Result Value Ref Range Status   MRSA by PCR NEGATIVE NEGATIVE Final    Comment:        The GeneXpert MRSA Assay (FDA approved for NASAL specimens only), is one component of a comprehensive MRSA colonization surveillance program. It is not intended to diagnose MRSA infection nor to guide or monitor treatment for MRSA infections. Performed at [facility], [address], [place], [place] [address]      Radiology Studies: DG Chest 1 View  Result Date: 03/03/2020 CLINICAL DATA:  Status post fall. EXAM: CHEST  1 VIEW COMPARISON:  May 11, 2019 FINDINGS: Very mild, chronic appearing increased lung markings are seen without evidence of acute infiltrate, pleural effusion or pneumothorax. The heart size and mediastinal contours are within normal limits. The visualized skeletal structures are unremarkable. IMPRESSION: No active  disease. Electronically Signed   By: Virgina Norfolk M.D.   On: 03/03/2020 19:34   CT Head Wo Contrast  Result Date: 03/03/2020 CLINICAL DATA:  Fall, hit head EXAM: CT HEAD WITHOUT CONTRAST TECHNIQUE: Contiguous axial images were obtained from the base of the skull through the vertex without intravenous contrast. COMPARISON:  12/16/2017 FINDINGS: Brain: Age related volume loss. No acute intracranial abnormality. Specifically, no hemorrhage, hydrocephalus, mass lesion, acute infarction, or significant intracranial injury. Vascular:  No hyperdense vessel or unexpected calcification. Skull: No acute calvarial abnormality. Sinuses/Orbits: Visualized paranasal sinuses and mastoids clear. Orbital soft tissues unremarkable. Other: None IMPRESSION: No acute intracranial abnormality. Electronically Signed   By: Rolm Baptise M.D.   On: 03/03/2020 19:05   CT Cervical Spine Wo Contrast  Result Date: 03/03/2020 CLINICAL DATA:  Fall, hit head EXAM: CT CERVICAL SPINE WITHOUT CONTRAST TECHNIQUE: Multidetector CT imaging of the cervical spine was performed without intravenous contrast. Multiplanar CT image reconstructions were also generated. COMPARISON:  None. FINDINGS: Alignment: Normal Skull base and vertebrae: No acute fracture. No primary bone lesion or focal pathologic process. Soft tissues and spinal canal: No prevertebral fluid or swelling. No visible canal hematoma. Disc levels:  Advanced diffuse degenerative disc and facet disease. Upper chest: No acute findings Other: None IMPRESSION: Advanced diffuse degenerative disc and facet disease. No acute bony abnormality. Electronically Signed   By: Rolm Baptise M.D.   On: 03/03/2020 19:04   DG HIP UNILAT W OR W/O PELVIS 2-3 VIEWS LEFT  Result Date: 03/03/2020 CLINICAL DATA:  79 year old male with fall and left hip pain. EXAM: DG HIP (WITH OR WITHOUT PELVIS) 2-3V LEFT COMPARISON:  Pelvic radiograph dated 11/02/2015 FINDINGS: There is a comminuted, displaced and angulated intertrochanteric fracture of the left femoral neck. There is mild elevation of the femoral shaft and impaction. There is no dislocation. The bones are osteopenic. The soft tissues are unremarkable. IMPRESSION: Comminuted, displaced and angulated intertrochanteric fracture of the left femoral neck. Electronically Signed   By: Anner Crete M.D.   On: 03/03/2020 19:35    Scheduled Meds: . diltiazem  30 mg Oral BID  . docusate sodium  100 mg Oral BID  . donepezil  10 mg Oral QHS  . fesoterodine  8 mg Oral Daily  . flecainide   75 mg Oral Q12H  . insulin aspart  0-20 Units Subcutaneous TID WC  . insulin aspart  0-5 Units Subcutaneous QHS  . insulin detemir  60 Units Subcutaneous QHS  . memantine  28 mg Oral Daily  . rosuvastatin  10 mg Oral Daily  . tamsulosin  0.4 mg Oral Daily   Continuous Infusions:   LOS: 1 day    Time spent: 30 minutes.   Barton Dubois, MD [facility] Pager [phone]  03/04/2020, 6:05 PM

## 2020-03-05 ENCOUNTER — Inpatient Hospital Stay (HOSPITAL_COMMUNITY): Payer: Medicare Other | Admitting: Anesthesiology

## 2020-03-05 ENCOUNTER — Inpatient Hospital Stay (HOSPITAL_COMMUNITY): Payer: Medicare Other

## 2020-03-05 ENCOUNTER — Encounter (HOSPITAL_COMMUNITY)
Admission: EM | Disposition: A | Discharge status: Skilled Nursing Facility | Payer: Self-pay | Source: Home / Self Care | Attending: Internal Medicine

## 2020-03-05 DIAGNOSIS — S72142A Displaced intertrochanteric fracture of left femur, initial encounter for closed fracture: Secondary | ICD-10-CM | POA: Diagnosis not present

## 2020-03-05 HISTORY — PX: INTRAMEDULLARY (IM) NAIL INTERTROCHANTERIC: SHX5875

## 2020-03-05 LAB — GLUCOSE, CAPILLARY
Glucose-Capillary: 181 mg/dL — ABNORMAL HIGH (ref 70–99)
Glucose-Capillary: 183 mg/dL — ABNORMAL HIGH (ref 70–99)
Glucose-Capillary: 188 mg/dL — ABNORMAL HIGH (ref 70–99)
Glucose-Capillary: 230 mg/dL — ABNORMAL HIGH (ref 70–99)

## 2020-03-05 LAB — HEMOGLOBIN AND HEMATOCRIT, BLOOD
HCT: 32.9 % — ABNORMAL LOW (ref 39.0–52.0)
Hemoglobin: 10.5 g/dL — ABNORMAL LOW (ref 13.0–17.0)

## 2020-03-05 SURGERY — FIXATION, FRACTURE, INTERTROCHANTERIC, WITH INTRAMEDULLARY ROD
Anesthesia: General | Site: Hip | Laterality: Left

## 2020-03-05 MED ORDER — SUGAMMADEX SODIUM 200 MG/2ML IV SOLN
INTRAVENOUS | Status: DC | PRN
  Administered 2020-03-05: 200 mg via INTRAVENOUS

## 2020-03-05 MED ORDER — RIVAROXABAN 20 MG PO TABS: 20.0000 mg | ORAL_TABLET | Freq: Every day | ORAL | Status: DC

## 2020-03-05 MED ORDER — LIDOCAINE HCL (CARDIAC) PF 100 MG/5ML IV SOSY
PREFILLED_SYRINGE | INTRAVENOUS | Status: DC | PRN
  Administered 2020-03-05: 80 mg via INTRATRACHEAL

## 2020-03-05 MED ORDER — FENTANYL CITRATE (PF) 250 MCG/5ML IJ SOLN
INTRAMUSCULAR | Status: DC | PRN
  Administered 2020-03-05 (×4): 50 ug via INTRAVENOUS

## 2020-03-05 MED ORDER — RIVAROXABAN 20 MG PO TABS
20.0000 mg | ORAL_TABLET | Freq: Every day | ORAL | Status: DC
  Administered 2020-03-05 – 2020-03-06 (×2): 20 mg via ORAL
  Filled 2020-03-05 (×2): qty 1

## 2020-03-05 MED ORDER — BUPIVACAINE-EPINEPHRINE (PF) 0.5% -1:200000 IJ SOLN
INTRAMUSCULAR | Status: DC | PRN
  Administered 2020-03-05: 60 mL via PERINEURAL

## 2020-03-05 MED ORDER — SUCCINYLCHOLINE CHLORIDE 200 MG/10ML IV SOSY
PREFILLED_SYRINGE | INTRAVENOUS | Status: AC
  Filled 2020-03-05: qty 10

## 2020-03-05 MED ORDER — ROCURONIUM BROMIDE 10 MG/ML (PF) SYRINGE
PREFILLED_SYRINGE | INTRAVENOUS | Status: AC
  Filled 2020-03-05: qty 10

## 2020-03-05 MED ORDER — LACTATED RINGERS IV SOLN: Freq: Once | INTRAVENOUS | Status: AC

## 2020-03-05 MED ORDER — DOCUSATE SODIUM 100 MG PO CAPS
100.0000 mg | ORAL_CAPSULE | Freq: Two times a day (BID) | ORAL | Status: DC
  Administered 2020-03-05 – 2020-03-07 (×4): 100 mg via ORAL
  Filled 2020-03-05 (×4): qty 1

## 2020-03-05 MED ORDER — CEFAZOLIN SODIUM-DEXTROSE 2-4 GM/100ML-% IV SOLN
2.0000 g | Freq: Four times a day (QID) | INTRAVENOUS | Status: AC
  Administered 2020-03-05 (×2): 2 g via INTRAVENOUS
  Filled 2020-03-05 (×2): qty 100

## 2020-03-05 MED ORDER — BISACODYL 5 MG PO TBEC: 5.0000 mg | DELAYED_RELEASE_TABLET | Freq: Every day | ORAL | Status: DC | PRN

## 2020-03-05 MED ORDER — SODIUM CHLORIDE 0.9 % IR SOLN
Status: DC | PRN
  Administered 2020-03-05: 1000 mL

## 2020-03-05 MED ORDER — PHENOL 1.4 % MT LIQD: 1.0000 | OROMUCOSAL | Status: DC | PRN

## 2020-03-05 MED ORDER — SUCCINYLCHOLINE CHLORIDE 200 MG/10ML IV SOSY
PREFILLED_SYRINGE | INTRAVENOUS | Status: DC | PRN
  Administered 2020-03-05: 120 mg via INTRAVENOUS

## 2020-03-05 MED ORDER — SENNOSIDES-DOCUSATE SODIUM 8.6-50 MG PO TABS: 1.0000 | ORAL_TABLET | Freq: Every evening | ORAL | Status: DC | PRN

## 2020-03-05 MED ORDER — FLEET ENEMA 7-19 GM/118ML RE ENEM: 1.0000 | ENEMA | Freq: Once | RECTAL | Status: DC | PRN

## 2020-03-05 MED ORDER — METOCLOPRAMIDE HCL 5 MG/ML IJ SOLN: 5.0000 mg | Freq: Three times a day (TID) | INTRAMUSCULAR | Status: DC | PRN

## 2020-03-05 MED ORDER — BUPIVACAINE-EPINEPHRINE (PF) 0.5% -1:200000 IJ SOLN
INTRAMUSCULAR | Status: AC
  Filled 2020-03-05: qty 60

## 2020-03-05 MED ORDER — PHENYLEPHRINE 40 MCG/ML (10ML) SYRINGE FOR IV PUSH (FOR BLOOD PRESSURE SUPPORT)
PREFILLED_SYRINGE | INTRAVENOUS | Status: DC | PRN
  Administered 2020-03-05: 120 ug via INTRAVENOUS
  Administered 2020-03-05: 200 ug via INTRAVENOUS
  Administered 2020-03-05: 160 ug via INTRAVENOUS

## 2020-03-05 MED ORDER — ROCURONIUM BROMIDE 10 MG/ML (PF) SYRINGE
PREFILLED_SYRINGE | INTRAVENOUS | Status: DC | PRN
  Administered 2020-03-05: 40 mg via INTRAVENOUS
  Administered 2020-03-05: 20 mg via INTRAVENOUS

## 2020-03-05 MED ORDER — ONDANSETRON HCL 4 MG/2ML IJ SOLN
INTRAMUSCULAR | Status: AC
  Filled 2020-03-05: qty 2

## 2020-03-05 MED ORDER — MENTHOL 3 MG MT LOZG: 1.0000 | LOZENGE | OROMUCOSAL | Status: DC | PRN

## 2020-03-05 MED ORDER — BUPIVACAINE-EPINEPHRINE (PF) 0.5% -1:200000 IJ SOLN
INTRAMUSCULAR | Status: AC
  Filled 2020-03-05: qty 30

## 2020-03-05 MED ORDER — LIDOCAINE 2% (20 MG/ML) 5 ML SYRINGE
INTRAMUSCULAR | Status: AC
  Filled 2020-03-05: qty 5

## 2020-03-05 MED ORDER — SODIUM CHLORIDE 0.9 % IV SOLN: INTRAVENOUS | Status: DC

## 2020-03-05 MED ORDER — HYDROMORPHONE HCL 1 MG/ML IJ SOLN: 0.2500 mg | INTRAMUSCULAR | Status: DC | PRN

## 2020-03-05 MED ORDER — PROPOFOL 10 MG/ML IV BOLUS
INTRAVENOUS | Status: DC | PRN
  Administered 2020-03-05: 120 mg via INTRAVENOUS

## 2020-03-05 MED ORDER — EPHEDRINE SULFATE-NACL 50-0.9 MG/10ML-% IV SOSY
PREFILLED_SYRINGE | INTRAVENOUS | Status: DC | PRN
  Administered 2020-03-05: 10 mg via INTRAVENOUS

## 2020-03-05 MED ORDER — ACETAMINOPHEN 10 MG/ML IV SOLN
INTRAVENOUS | Status: AC
  Filled 2020-03-05: qty 100

## 2020-03-05 MED ORDER — FENTANYL CITRATE (PF) 250 MCG/5ML IJ SOLN
INTRAMUSCULAR | Status: AC
  Filled 2020-03-05: qty 5

## 2020-03-05 MED ORDER — PROPOFOL 10 MG/ML IV BOLUS
INTRAVENOUS | Status: AC
  Filled 2020-03-05: qty 20

## 2020-03-05 MED ORDER — METOCLOPRAMIDE HCL 10 MG PO TABS: 5.0000 mg | ORAL_TABLET | Freq: Three times a day (TID) | ORAL | Status: DC | PRN

## 2020-03-05 MED ORDER — MORPHINE SULFATE (PF) 2 MG/ML IV SOLN
2.0000 mg | INTRAVENOUS | Status: DC | PRN
  Administered 2020-03-06 (×2): 2 mg via INTRAVENOUS
  Filled 2020-03-05 (×2): qty 1

## 2020-03-05 MED ORDER — ONDANSETRON HCL 4 MG/2ML IJ SOLN
INTRAMUSCULAR | Status: DC | PRN
  Administered 2020-03-05: 4 mg via INTRAVENOUS

## 2020-03-05 MED ORDER — LACTATED RINGERS IV SOLN: INTRAVENOUS | Status: DC | PRN

## 2020-03-05 MED ORDER — ONDANSETRON HCL 4 MG/2ML IJ SOLN: 4.0000 mg | Freq: Once | INTRAMUSCULAR | Status: DC | PRN

## 2020-03-05 MED ORDER — ACETAMINOPHEN 10 MG/ML IV SOLN
1000.0000 mg | Freq: Once | INTRAVENOUS | Status: AC
  Administered 2020-03-05: 1000 mg via INTRAVENOUS

## 2020-03-05 SURGICAL SUPPLY — 63 items
APL PRP STRL LF DISP 70% ISPRP (MISCELLANEOUS) ×1
BIT DRILL AO GAMMA 4.2X300 (BIT) ×2 IMPLANT
BLADE HEX COATED 2.75 (ELECTRODE) ×2 IMPLANT
BLADE SURG SZ10 CARB STEEL (BLADE) ×4 IMPLANT
BNDG GAUZE ELAST 4 BULKY (GAUZE/BANDAGES/DRESSINGS) ×2 IMPLANT
CHLORAPREP W/TINT 26 (MISCELLANEOUS) ×2 IMPLANT
CLOTH BEACON ORANGE TIMEOUT ST (SAFETY) ×2 IMPLANT
COVER LIGHT HANDLE STERIS (MISCELLANEOUS) ×4 IMPLANT
COVER MAYO STAND XLG (MISCELLANEOUS) ×2 IMPLANT
COVER WAND RF STERILE (DRAPES) ×2 IMPLANT
DECANTER SPIKE VIAL GLASS SM (MISCELLANEOUS) ×4 IMPLANT
DRAPE STERI IOBAN 125X83 (DRAPES) ×2 IMPLANT
DRSG MEPILEX BORDER 4X12 (GAUZE/BANDAGES/DRESSINGS) ×2 IMPLANT
DRSG PAD ABDOMINAL 8X10 ST (GAUZE/BANDAGES/DRESSINGS) ×2 IMPLANT
ELECT REM PT RETURN 9FT ADLT (ELECTROSURGICAL) ×2
ELECTRODE REM PT RTRN 9FT ADLT (ELECTROSURGICAL) ×1 IMPLANT
GLOVE BIOGEL M 6.5 STRL (GLOVE) ×1 IMPLANT
GLOVE BIOGEL PI IND STRL 6.5 (GLOVE) IMPLANT
GLOVE BIOGEL PI IND STRL 7.0 (GLOVE) ×2 IMPLANT
GLOVE BIOGEL PI IND STRL 7.5 (GLOVE) IMPLANT
GLOVE BIOGEL PI INDICATOR 6.5 (GLOVE) ×1
GLOVE BIOGEL PI INDICATOR 7.0 (GLOVE) ×2
GLOVE BIOGEL PI INDICATOR 7.5 (GLOVE) ×1
GLOVE SKINSENSE NS SZ8.0 LF (GLOVE) ×1
GLOVE SKINSENSE STRL SZ8.0 LF (GLOVE) ×1 IMPLANT
GLOVE SS N UNI LF 8.5 STRL (GLOVE) ×2 IMPLANT
GOWN STRL REUS W/TWL LRG LVL3 (GOWN DISPOSABLE) ×4 IMPLANT
GOWN STRL REUS W/TWL XL LVL3 (GOWN DISPOSABLE) ×2 IMPLANT
GUIDEROD T2 3X1000 (ROD) ×2 IMPLANT
INST SET MAJOR BONE (KITS) ×2 IMPLANT
K-WIRE  3.2X450M STR (WIRE) ×2
K-WIRE 3.2X450M STR (WIRE) ×1
KIT BLADEGUARD II DBL (SET/KITS/TRAYS/PACK) ×2 IMPLANT
KIT TURNOVER CYSTO (KITS) ×2 IMPLANT
KWIRE 3.2X450M STR (WIRE) ×1 IMPLANT
MANIFOLD NEPTUNE II (INSTRUMENTS) ×2 IMPLANT
MARKER SKIN DUAL TIP RULER LAB (MISCELLANEOUS) ×2 IMPLANT
NAIL TROCH GAMMA 11X18 (Nail) ×1 IMPLANT
NDL HYPO 21X1.5 SAFETY (NEEDLE) ×1 IMPLANT
NDL SPNL 18GX3.5 QUINCKE PK (NEEDLE) ×1 IMPLANT
NEEDLE HYPO 21X1.5 SAFETY (NEEDLE) ×2 IMPLANT
NEEDLE SPNL 18GX3.5 QUINCKE PK (NEEDLE) ×2 IMPLANT
NS IRRIG 1000ML POUR BTL (IV SOLUTION) ×2 IMPLANT
PACK BASIC III (CUSTOM PROCEDURE TRAY) ×2
PACK SRG BSC III STRL LF ECLPS (CUSTOM PROCEDURE TRAY) ×1 IMPLANT
PENCIL SMOKE EVACUATOR COATED (MISCELLANEOUS) ×2 IMPLANT
SCREW LAG GAMMA 3 TI 10.5X115M (Screw) ×1 IMPLANT
SCREW LOCKING T2 F/T  5X42.5MM (Screw) ×2 IMPLANT
SCREW LOCKING T2 F/T 5X42.5MM (Screw) IMPLANT
SET BASIN LINEN APH (SET/KITS/TRAYS/PACK) ×2 IMPLANT
SLING ARM FOAM STRAP LRG (SOFTGOODS) IMPLANT
SLING ARM FOAM STRAP MED (SOFTGOODS) IMPLANT
SLING ARM FOAM STRAP XLG (SOFTGOODS) IMPLANT
SPONGE LAP 18X18 RF (DISPOSABLE) ×4 IMPLANT
STAPLER VISISTAT 35W (STAPLE) ×2 IMPLANT
SUT BRALON NAB BRD #1 30IN (SUTURE) ×2 IMPLANT
SUT MNCRL 0 VIOLET CTX 36 (SUTURE) ×1 IMPLANT
SUT MON AB 2-0 CT1 36 (SUTURE) ×2 IMPLANT
SUT MONOCRYL 0 CTX 36 (SUTURE) ×2
SYR 30ML LL (SYRINGE) ×2 IMPLANT
SYR BULB IRRIGATION 50ML (SYRINGE) ×4 IMPLANT
TRAY FOLEY MTR SLVR 16FR STAT (SET/KITS/TRAYS/PACK) ×2 IMPLANT
YANKAUER SUCT BULB TIP NO VENT (SUCTIONS) ×2 IMPLANT

## 2020-03-05 NOTE — Anesthesia Postprocedure Evaluation (Signed)
Anesthesia Post Note  Patient: Christian [name]  Procedure(s) Performed: INTRAMEDULLARY (IM) NAIL INTERTROCHANTRIC (Left )  Patient location during evaluation: PACU Anesthesia Type: General Level of consciousness: awake, sedated, awake and alert and patient cooperative Pain management: pain level controlled Vital Signs Assessment: post-procedure vital signs reviewed and stable Respiratory status: spontaneous breathing, respiratory function stable and non-rebreather facemask (regular face mask is not available) Cardiovascular status: blood pressure returned to baseline Postop Assessment: no apparent nausea or vomiting Anesthetic complications: no     Last Vitals:  Vitals:   03/05/20 1215 03/05/20 1218  BP: 138/70   Pulse: 80   Resp: 10   Temp:    SpO2: 92% 96%    Last Pain:  Vitals:   03/05/20 1219  TempSrc:   PainSc: 0-No pain      LLE Sensation: No tingling;No pain;No numbness;Full sensation (03/05/20 1219)          Shawan Corella C Liliya Fullenwider

## 2020-03-05 NOTE — Op Note (Signed)
03/05/2020  11:33 AM  PATIENT:  Christian [name]  79 y.o. male  PRE-OPERATIVE DIAGNOSIS:  left hip fracture - intertrochanteric  POST-OPERATIVE DIAGNOSIS:  left hip fracture - intertrochanteric  PROCEDURE:  Procedure(s): INTRAMEDULLARY (IM) NAIL INTERTROCHANTRIC (Left) CPT: [id number]  Implants 125 degree gamma nail short, 115 mm lag screw Acorn proximal 42.5 distal locking screw  Findings essentially a three-part inner troches fracture with displacement small lesser troches posterior medial buttress was intact  SURGEON:  Surgeon(s) and Role:    Carole Civil, MD - Primary  There were no assistants  We did general anesthesia  We estimated 500 cc of blood loss  There was no blood administered and no drains    60 cc of local Marcaine was used  No specimens  Counts correct  Sales executive  PLAN OF CARE: Admit to inpatient   PATIENT DISPOSITION:  PACU - hemodynamically stable.   Delay start of Pharmacological VTE agent (>24hrs) due to surgical blood loss or risk of bleeding: not applicable  Details Christian [name] was identified in preop surgical site confirmed marked chart review completed x-rays reviewed.  Implants checked and available  He was taken to the operating for general anesthesia was placed on the fracture table.  We padded his right leg and place his left leg in traction after general anesthesia was administered.  We brought in the C arm we took x-rays.  We used straight traction and gentle internal rotation and reapproximated the fracture fragments into stable configuration.  He was then prepped and draped sterilely  Timeout was completed  The incision was made over the trochanter of the left hip subcutaneous tissue was divided it was edematous and had quite a bit of bleeding which was controlled with electrocautery  We identified the greater trochanter split the fascia divided the muscle underneath bluntly evacuated the hematoma and placed the curved  awl into the trochanter.  We aim for the tip of the trochanter if not slight medially and passed it down into the femoral shaft.  Imaging confirm position and we passed the proximal reamer down to the lesser trochanter.  We then passed the guidewire confirmed this position with x-ray and then passed the 125 degree nail over the guidewire and then removing it.  We then made a second incision just distal to the first 1 divided the skin fascia and muscle down to bone and advance the cannula.  We checked position on lateral x-ray to check rotation and use the AP to check position of the wire track.  We advanced the wire into the center of the femoral head within 10 mm of subchondral bone on both x-ray AP and lateral.  We measured 113 mm and passed the reamer set at 115 mm over the guidewire.  We then advanced to the lag screw released the traction and then compressed the fracture using the compression wheel on the lag screw handle.  We irrigated the proximal and guide and then placed an acorn tightening it down completely and then reversing it one quarter turn toggling the screw handle to ensure engagement.  We then moved to the distal locking screw which required a third incision which was a stab wound the pointed tip cannula system was advanced to bone imaging showed the cannula to be in perfect position for dynamic screw fixation we advanced the drill bit across the femur and measured at 42.5 placing a 40 2.5 screw  Final films showed reapproximation of the fracture fragments good position  of the hardware tip to apex distance less than 25 mm  We irrigated the proximal wound second wound and third wound, closed the fascia with #1 Braylon followed by 0 Monocryl then 2-0 Monocryl and staples we closed the 2 distal wounds with 2-0 Monocryl.   A sterile dressing was applied  The patient was extubated and taken to recovery room in stable condition  Postoperative plan He will be weightbearing as  tolerated Hemoglobin will be ordered for 4 PM and depending on the level we may start his Xarelto at 6 PM if the hemoglobin is too low we will transfuse and start the Xarelto later tonight or early tomorrow  Staples can be removed postop day 14 Follow-up can be 4 weeks from today x-ray to be done in the office He will be back on Xarelto which is a chronic medicine for him so no additional DVT prophylaxis is needed

## 2020-03-05 NOTE — Transfer of Care (Signed)
Immediate Anesthesia Transfer of Care Note  Patient: Christian [name]  Procedure(s) Performed: INTRAMEDULLARY (IM) NAIL INTERTROCHANTRIC (Left )  Patient Location: PACU  Anesthesia Type:General  Level of Consciousness: awake, sedated, drowsy, patient cooperative and responds to stimulation  Airway & Oxygen Therapy: Patient Spontanous Breathing and Patient connected to nasal cannula oxygen  Post-op Assessment: Report given to RN and Post -op Vital signs reviewed and stable  Post vital signs: Reviewed and stable  Last Vitals:  Vitals Value Taken Time  BP 152/73 03/05/20 1146  Temp 36.9 C 03/05/20 1133  Pulse 75 03/05/20 1149  Resp 12 03/05/20 1149  SpO2 94 % 03/05/20 1149  Vitals shown include unvalidated device data.  Last Pain:  Vitals:   03/05/20 1133  TempSrc:   PainSc: Asleep         Complications: No apparent anesthesia complications

## 2020-03-05 NOTE — Interval H&P Note (Signed)
History and Physical Interval Note:  03/05/2020 9:15 AM  Christian [name]  has presented today for surgery, with the diagnosis of left hip fracture - intertrochanteric.  The various methods of treatment have been discussed with the patient and family. After consideration of risks, benefits and other options for treatment, the patient has consented to  Procedure(s): INTRAMEDULLARY (IM) NAIL INTERTROCHANTRIC (Left) as a surgical intervention.  The patient's history has been reviewed, patient examined, no change in status, stable for surgery.  I have reviewed the patient's chart and labs.  Questions were answered to the patient's satisfaction.     Arther Abbott

## 2020-03-05 NOTE — Brief Op Note (Signed)
03/03/2020 - 03/05/2020  11:33 AM  PATIENT:  Christian [name]  79 y.o. male  PRE-OPERATIVE DIAGNOSIS:  left hip fracture - intertrochanteric  POST-OPERATIVE DIAGNOSIS:  left hip fracture - intertrochanteric  PROCEDURE:  Procedure(s): INTRAMEDULLARY (IM) NAIL INTERTROCHANTRIC (Left)   Implants 125 degree gamma nail short, 115 mm lag screw Acorn proximal 42.5 distal locking screw  Findings essentially a three-part inner troches fracture with displacement small lesser troches posterior medial buttress was intact  SURGEON:  Surgeon(s) and Role:    Carole Civil, MD - Primary  There were no assistants  We did general anesthesia  We estimated 500 cc of blood loss  There was no blood administered and no drains    60 cc of local Marcaine was used  No specimens  Counts correct  Sales executive  PLAN OF CARE: Admit to inpatient   PATIENT DISPOSITION:  PACU - hemodynamically stable.   Delay start of Pharmacological VTE agent (>24hrs) due to surgical blood loss or risk of bleeding: not applicable  Details [name] was identified in preop surgical site confirmed marked chart review completed x-rays reviewed.  Implants checked and available  He was taken to the operating for general anesthesia was placed on the fracture table.  We padded his right leg and place his left leg in traction after general anesthesia was administered.  We brought in the C arm we took x-rays.  We used straight traction and gentle internal rotation and reapproximated the fracture fragments into stable configuration.  He was then prepped and draped sterilely  Timeout was completed  The incision was made over the trochanter of the left hip subcutaneous tissue was divided it was edematous and had quite a bit of bleeding which was controlled with electrocautery  We identified the greater trochanter split the fascia divided the muscle underneath bluntly evacuated the hematoma and placed the curved  awl into the trochanter.  We aim for the tip of the trochanter if not slight medially and passed it down into the femoral shaft.  Imaging confirm position and we passed the proximal reamer down to the lesser trochanter.  We then passed the guidewire confirmed this position with x-ray and then passed the 125 degree nail over the guidewire and then removing it.  We then made a second incision just distal to the first 1 divided the skin fascia and muscle down to bone and advance the cannula.  We checked position on lateral x-ray to check rotation and use the AP to check position of the wire track.  We advanced the wire into the center of the femoral head within 10 mm of subchondral bone on both x-ray AP and lateral.  We measured 113 mm and passed the reamer set at 115 mm over the guidewire.  We then advanced to the lag screw released the traction and then compressed the fracture using the compression wheel on the lag screw handle.  We irrigated the proximal and guide and then placed an acorn tightening it down completely and then reversing it one quarter turn toggling the screw handle to ensure engagement.  We then moved to the distal locking screw which required a third incision which was a stab wound the pointed tip cannula system was advanced to bone imaging showed the cannula to be in perfect position for dynamic screw fixation we advanced the drill bit across the femur and measured at 42.5 placing a 40 2.5 screw  Final films showed reapproximation of the fracture fragments good  position of the hardware tip to apex distance less than 25 mm  We irrigated the proximal wound second wound and third wound, closed the fascia with #1 Braylon followed by 0 Monocryl then 2-0 Monocryl and staples we closed the 2 distal wounds with 2-0 Monocryl.   A sterile dressing was applied  The patient was extubated and taken to recovery room in stable condition  Postoperative plan He will be weightbearing as  tolerated Hemoglobin will be ordered for 4 PM and depending on the level we may start his Xarelto at 6 PM if the hemoglobin is too low we will transfuse and start the Xarelto later tonight or early tomorrow  Staples can be removed postop day 14 Follow-up can be 4 weeks from today x-ray to be done in the office He will be back on Xarelto which is a chronic medicine for him so no additional DVT prophylaxis is needed

## 2020-03-05 NOTE — Addendum Note (Signed)
Addendum  created 03/05/20 2117 by Denese Killings, MD   Charge Capture section accepted, Visit diagnoses modified

## 2020-03-05 NOTE — Progress Notes (Signed)
PROGRESS NOTE    NASHUA SHRIVER  [id number] DOB: [birth year] DOA: 03/03/2020 PCP: Carlena Hurl, PA-C     Brief Narrative:  As per H&P written by Dr. Nehemiah Settle on 03/03/2020 79 y.o. male with a history of PE and DVT secondary to prothrombin gene, currently on anticoagulation, type 2 diabetes with poor control, hypertension, dementia, depression.  Patient seen for fall that occurred earlier today.  He was attempting to ambulate and fell when he tried to get out of his chair.  He landed on his left hip and had immediate pain that is nonradiating.  Pain worse with movement and improved with rest.  EMS was called and the patient was taken to the hospital for evaluation.  In conversing with wife, the patient has "given up on life".  He has had limited mobility, eats what he wants, does not take check his blood sugars.  Patient mostly sits in chair or is in bed.  He has had severe deconditioning has been ongoing for the past few months.  Emergency Department Course: Hip x-ray shows left femoral neck fracture that is comminuted, displaced, and angulated.  Orthopedics has been consulted   Assessment & Plan: 1-s/p left hip fracture: Following mechanical fall prior to admission. -Continue holding chronic anticoagulation for anticipated surgical intervention and internal fixation later today 03/05/2020. -Continue analgesics as needed -Continue supportive care. -Orthopedic service (Dr. Aline Brochure) is on board; will follow further recommendations.  2-uncontrolled type 2 diabetes with hyperglycemia -Continue holding oral hypoglycemic agents while inpatient -Continue sliding scale insulin and Lantus -Follow CBGs and further adjust hypoglycemic regimen as required based on CBGs fluctuation.  3-Essential hypertension -Overall stable and well-controlled -Continue current antihypertensive regimens.  4-Dementia without behavioral disturbance (HCC) -Continue Aricept and Namenda -Constant  reorientation -Stable mood currently.  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 -After surgical intervention. -Continue current rate control/antiarrhythmic agents. -Continue monitoring on telemetry.  6-Alcohol abuse -No prior history of withdrawal -Continue close monitoring.  7-History of ulcer nonhemorrhagic stroke -No focal deficits appreciated -Continue risk factor modifications and resumption of anticoagulation for secondary prevention after surgical intervention.  8-Benign prostatic hyperplasia with lower urinary tract symptoms -No complaints of urinary retention -Continue Flomax.  DVT prophylaxis: SCDs Code Status: DNR Family Communication: No family at bedside. Disposition Plan: Remains inpatient, continue supportive care and as needed analgesics; anticipated internal fixation of his left hip later today 03/05/2020.  Patient remains at high risk for surgery given underlying comorbidities and previous long-term anticoagulation, but benefits outweigh risk.  Anticipate most likely discharge to skilled nursing facility after surgical intervention for rehabilitation/conditioning and further care. Follow Hgb trend after surgery and PT evaluation and recommendations.   Consultants:   Orthopedic service (Dr. Aline Brochure).  Procedures:   Anticipated left hip surgery for internal fixation on 03/05/2020.  Antimicrobials:  Anti-infectives (From admission, onward)   Start     Dose/Rate Route Frequency Ordered Stop   03/05/20 1600  ceFAZolin (ANCEF) IVPB 2g/100 mL premix     2 g 200 mL/hr over 30 Minutes Intravenous Every 6 hours 03/05/20 1239 03/06/20 0359   03/05/20 0600  ceFAZolin (ANCEF) IVPB 2g/100 mL premix     2 g 200 mL/hr over 30 Minutes Intravenous On call to O.R. 03/04/20 2249 03/05/20 0951       Subjective: He is still having intermittent hip pain (on the left) while attempting movements.   No chest pain, no nausea, no vomiting, no shortness of  breath.  Objective: Vitals:   03/05/20 1200 03/05/20 1215 03/05/20 1218 03/05/20 1355  BP: (!) 153/70 138/70  129/69  Pulse: 81 80  78  Resp: 14 10  20   Temp:    98.3 F (36.8 C)  TempSrc:    Oral  SpO2: 94% 92% 96% 97%  Weight:      Height:        Intake/Output Summary (Last 24 hours) at 03/05/2020 1404 Last data filed at 03/05/2020 1217 Gross per 24 hour  Intake 1400 ml  Output 1600 ml  Net -200 ml   Filed Weights   03/03/20 1750 03/03/20 2214  Weight: 108 kg 107.5 kg    Examination: General exam: Alert, awake, oriented x 2, no chest pain, no nausea, no vomiting, no fever.  Feels ready for surgical intervention.  Intermittent left hip pain with movement reported. Respiratory system: Clear to auscultation. Respiratory effort normal. Cardiovascular system:Rate controlled, no rubs, no gallops. Gastrointestinal system: Abdomen is nondistended, soft and nontender. No organomegaly or masses felt. Normal bowel sounds heard. Central nervous system: Alert and oriented. No focal neurological deficits. Extremities: No cyanosis or clubbing. Skin: No rashes, lesions or ulcers Psychiatry: Mood & affect appropriate.    Data Reviewed: I have personally reviewed following labs and imaging studies  CBC: Recent Labs  Lab 03/03/20 1811 03/04/20 0649  WBC 9.7 11.6*  HGB 14.1 12.0*  HCT 44.8 38.3*  MCV 94.5 94.8  PLT 227 0000000   Basic Metabolic Panel: Recent Labs  Lab 03/03/20 1811 03/04/20 0649  NA 136 137  K 4.4 4.3  CL 104 102  CO2 23 23  GLUCOSE 321* 296*  BUN 17 17  CREATININE 0.88 0.75  CALCIUM 8.7* 8.8*   GFR: Estimated Creatinine Clearance: 98 mL/min (by C-G formula based on SCr of 0.75 mg/dL).   Liver Function Tests: Recent Labs  Lab 03/03/20 1811  AST 17  ALT 15  ALKPHOS 86  BILITOT 0.5  PROT 6.7  ALBUMIN 3.5   Coagulation Profile: Recent Labs  Lab 03/04/20 0649  INR 1.2   CBG: Recent Labs    Lab 03/04/20 1136 03/04/20 1653 03/04/20 2205 03/05/20 0718 03/05/20 1135  GLUCAP 291* 246* 288* 181* 183*   Urine analysis:    Component Value Date/Time   COLORURINE YELLOW 05/12/2019 0008   APPEARANCEUR CLEAR 05/12/2019 0008   LABSPEC 1.024 05/12/2019 0008   LABSPEC 1.015 10/28/2018 1522   PHURINE 5.0 05/12/2019 0008   GLUCOSEU >=500 (A) 05/12/2019 0008   HGBUR NEGATIVE 05/12/2019 0008   BILIRUBINUR NEGATIVE 05/12/2019 0008   BILIRUBINUR negative 10/28/2018 1522   BILIRUBINUR n 05/10/2016 1500   KETONESUR 5 (A) 05/12/2019 0008   PROTEINUR NEGATIVE 05/12/2019 0008   UROBILINOGEN negative 05/10/2016 1500   UROBILINOGEN 0.2 01/31/2015 1505   NITRITE NEGATIVE 05/12/2019 0008   LEUKOCYTESUR NEGATIVE 05/12/2019 0008    Recent Results (from the past 240 hour(s))  SARS CORONAVIRUS 2 (TAT 6-24 HRS) Nasopharyngeal Nasopharyngeal Swab     Status: None   Collection Time: 03/03/20  6:28 PM   Specimen: Nasopharyngeal Swab  Result Value Ref Range Status   SARS Coronavirus 2 NEGATIVE NEGATIVE Final    Comment: (NOTE) SARS-CoV-2 target nucleic acids are NOT DETECTED. The SARS-CoV-2 RNA is generally detectable in upper and lower respiratory specimens during the acute phase of infection. Negative results do not preclude SARS-CoV-2 infection, do not rule out co-infections with other pathogens, and should not be used as the sole basis for treatment or other  patient management decisions. Negative results must be combined with clinical observations, patient history, and epidemiological information. The expected result is Negative. Fact Sheet for Patients: SugarRoll.be Fact Sheet for Healthcare Providers: https://www.woods-mathews.com/ This test is not yet approved or cleared by the [place] FDA and  has been authorized for detection and/or diagnosis of SARS-CoV-2 by FDA under an Emergency Use Authorization (EUA). This EUA will remain  in  effect (meaning this test can be used) for the duration of the COVID-19 declaration under Section 56 4(b)(1) of the Act, 21 U.S.C. section 360bbb-3(b)(1), unless the authorization is terminated or revoked sooner. Performed at [facility], [place] [address], [place], [place] [address]   MRSA PCR Screening     Status: None   Collection Time: 03/04/20 11:31 AM   Specimen: Nasal Mucosa; Nasopharyngeal  Result Value Ref Range Status   MRSA by PCR NEGATIVE NEGATIVE Final    Comment:        The GeneXpert MRSA Assay (FDA approved for NASAL specimens only), is one component of a comprehensive MRSA colonization surveillance program. It is not intended to diagnose MRSA infection nor to guide or monitor treatment for MRSA infections. Performed at [facility], [address] [place], [place] [address]      Radiology Studies: DG Chest 1 View  Result Date: 03/03/2020 CLINICAL DATA:  Status post fall. EXAM: CHEST  1 VIEW COMPARISON:  May 11, 2019 FINDINGS: Very mild, chronic appearing increased lung markings are seen without evidence of acute infiltrate, pleural effusion or pneumothorax. The heart size and mediastinal contours are within normal limits. The visualized skeletal structures are unremarkable. IMPRESSION: No active disease. Electronically Signed   By: Virgina Norfolk M.D.   On: 03/03/2020 19:34   CT Head Wo Contrast  Result Date: 03/03/2020 CLINICAL DATA:  Fall, hit head EXAM: CT HEAD WITHOUT CONTRAST TECHNIQUE: Contiguous axial images were obtained from the base of the skull through the vertex without intravenous contrast. COMPARISON:  12/16/2017 FINDINGS: Brain: Age related volume loss. No acute intracranial abnormality. Specifically, no hemorrhage, hydrocephalus, mass lesion, acute infarction, or significant intracranial injury. Vascular: No hyperdense vessel or unexpected calcification. Skull: No acute calvarial abnormality. Sinuses/Orbits: Visualized paranasal sinuses and  mastoids clear. Orbital soft tissues unremarkable. Other: None IMPRESSION: No acute intracranial abnormality. Electronically Signed   By: Rolm Baptise M.D.   On: 03/03/2020 19:05   CT Cervical Spine Wo Contrast  Result Date: 03/03/2020 CLINICAL DATA:  Fall, hit head EXAM: CT CERVICAL SPINE WITHOUT CONTRAST TECHNIQUE: Multidetector CT imaging of the cervical spine was performed without intravenous contrast. Multiplanar CT image reconstructions were also generated. COMPARISON:  None. FINDINGS: Alignment: Normal Skull base and vertebrae: No acute fracture. No primary bone lesion or focal pathologic process. Soft tissues and spinal canal: No prevertebral fluid or swelling. No visible canal hematoma. Disc levels:  Advanced diffuse degenerative disc and facet disease. Upper chest: No acute findings Other: None IMPRESSION: Advanced diffuse degenerative disc and facet disease. No acute bony abnormality. Electronically Signed   By: Rolm Baptise M.D.   On: 03/03/2020 19:04   DG Pelvis Portable  Result Date: 03/05/2020 CLINICAL DATA:  Left hip fracture repair EXAM: PORTABLE PELVIS 1-2 VIEWS COMPARISON:  None. FINDINGS: The patient is status post ORIF of a left hip fracture. A gamma nail and intramedullary femoral rod are identified with a distal interlocking screw. IMPRESSION: Left hip fracture repair as above. Electronically Signed   By: Dorise Bullion III M.D   On: 03/05/2020 12:32  DG HIP OPERATIVE UNILAT WITH PELVIS LEFT  Result Date: 03/05/2020 CLINICAL DATA:  Left hip fracture repair. FLUOROSCOPY TIME:  1 minutes 29 seconds EXAM: OPERATIVE LEFT HIP (WITH PELVIS IF PERFORMED) 11 VIEWS TECHNIQUE: Fluoroscopic spot image(s) were submitted for interpretation post-operatively. COMPARISON:  None. FINDINGS: A gamma nail and femoral rod are placed across the known left hip fracture throughout the study. IMPRESSION: Left hip fracture repair as above. Electronically Signed   By: Dorise Bullion III M.D   On:  03/05/2020 12:34   DG HIP Kaylyn Layer W OR W/O PELVIS 2-3 VIEWS LEFT  Result Date: 03/03/2020 CLINICAL DATA:  79 year old male with fall and left hip pain. EXAM: DG HIP (WITH OR WITHOUT PELVIS) 2-3V LEFT COMPARISON:  Pelvic radiograph dated 11/02/2015 FINDINGS: There is a comminuted, displaced and angulated intertrochanteric fracture of the left femoral neck. There is mild elevation of the femoral shaft and impaction. There is no dislocation. The bones are osteopenic. The soft tissues are unremarkable. IMPRESSION: Comminuted, displaced and angulated intertrochanteric fracture of the left femoral neck. Electronically Signed   By: Anner Crete M.D.   On: 03/03/2020 19:35    Scheduled Meds: . diltiazem  30 mg Oral BID  . docusate sodium  100 mg Oral BID  . donepezil  10 mg Oral QHS  . fesoterodine  8 mg Oral Daily  . flecainide  75 mg Oral Q12H  . insulin aspart  0-20 Units Subcutaneous TID WC  . insulin aspart  0-5 Units Subcutaneous QHS  . insulin detemir  65 Units Subcutaneous QHS  . memantine  28 mg Oral Daily  . rosuvastatin  10 mg Oral Daily  . tamsulosin  0.4 mg Oral Daily   Continuous Infusions: . sodium chloride    .  ceFAZolin (ANCEF) IV       LOS: 2 days    Time spent: 30 minutes.   Barton Dubois, MD [facility] Pager [phone]  03/05/2020, 2:04 PM

## 2020-03-05 NOTE — Progress Notes (Signed)
Patient ID: Christian [name], male   DOB: 06/08/1941, 79 y.o.   MRN: [id number]  CBC Latest Ref Rng & Units 03/05/2020 03/04/2020 03/03/2020  WBC 4.0 - 10.5 K/uL - 11.6(H) 9.7  Hemoglobin 13.0 - 17.0 g/dL 10.5(L) 12.0(L) 14.1  Hematocrit 39.0 - 52.0 % 32.9(L) 38.3(L) 44.8  Platelets 150 - 400 K/uL - 249 227   The patient's hemoglobin after surgery 10.5, patient will be restarted on Xarelto 20 mg daily starting today due to his high risk of clotting secondary to history of DVT PE and prothrombin gene abnrmality

## 2020-03-05 NOTE — Progress Notes (Addendum)
Patient from surgery earlier. Placed back on 3 liters oxygen for low saturation. 82 . CPAP machine is in room not set up ( not placed by me). Patient record states he wears CPAP, sleep study results say he may need one but in split level test he never slept enough to use one. Patient is still to drowsy to Explain basically stated no did not use. Saturation is 95 now on 3 liters so will hold CPAP and use if indicated.

## 2020-03-05 NOTE — Anesthesia Procedure Notes (Signed)
Procedure Name: Intubation Date/Time: 03/05/2020 9:34 AM Performed by: Denese Killings, MD Pre-anesthesia Checklist: Patient identified, Emergency Drugs available, Suction available and Patient being monitored Patient Re-evaluated:Patient Re-evaluated prior to induction Oxygen Delivery Method: Circle system utilized Preoxygenation: Pre-oxygenation with 100% oxygen Induction Type: IV induction Laryngoscope Size: Glidescope and 4 Grade View: Grade I Tube type: Oral Tube size: 7.5 mm Number of attempts: 1 Airway Equipment and Method: Stylet and Oral airway Placement Confirmation: ETT inserted through vocal cords under direct vision,  positive ETCO2 and breath sounds checked- equal and bilateral Secured at: 24 cm Tube secured with: Tape Dental Injury: Teeth and Oropharynx as per pre-operative assessment

## 2020-03-06 LAB — BASIC METABOLIC PANEL
Anion gap: 8 (ref 5–15)
BUN: 12 mg/dL (ref 8–23)
CO2: 26 mmol/L (ref 22–32)
Calcium: 8.2 mg/dL — ABNORMAL LOW (ref 8.9–10.3)
Chloride: 103 mmol/L (ref 98–111)
Creatinine, Ser: 0.67 mg/dL (ref 0.61–1.24)
GFR calc Af Amer: >60 mL/min (ref >60)
GFR calc non Af Amer: >60 mL/min (ref >60)
Glucose, Bld: 202 mg/dL — ABNORMAL HIGH (ref 70–99)
Potassium: 4 mmol/L (ref 3.5–5.1)
Sodium: 137 mmol/L (ref 135–145)

## 2020-03-06 LAB — GLUCOSE, CAPILLARY
Glucose-Capillary: 186 mg/dL — ABNORMAL HIGH (ref 70–99)
Glucose-Capillary: 237 mg/dL — ABNORMAL HIGH (ref 70–99)
Glucose-Capillary: 284 mg/dL — ABNORMAL HIGH (ref 70–99)
Glucose-Capillary: 268 mg/dL — ABNORMAL HIGH (ref 70–99)

## 2020-03-06 LAB — HEMOGLOBIN A1C
Hgb A1c MFr Bld: 10.9 % — ABNORMAL HIGH (ref 4.8–5.6)
Mean Plasma Glucose: 266 mg/dL

## 2020-03-06 LAB — CBC
HCT: 32 % — ABNORMAL LOW (ref 39.0–52.0)
Hemoglobin: 10 g/dL — ABNORMAL LOW (ref 13.0–17.0)
MCH: 29.8 pg (ref 26.0–34.0)
MCHC: 31.3 g/dL (ref 30.0–36.0)
MCV: 95.2 fL (ref 80.0–100.0)
Platelets: 219 10*3/uL (ref 150–400)
RBC: 3.36 MIL/uL — ABNORMAL LOW (ref 4.22–5.81)
RDW: 12.3 % (ref 11.5–15.5)
WBC: 9.8 10*3/uL (ref 4.0–10.5)
nRBC: 0 % (ref 0.0–0.2)

## 2020-03-06 NOTE — TOC Initial Note (Signed)
Transition of Care [facility]) - Initial/Assessment Note    Patient Details  Name: Christian [name] MRN: [id number] Date of Birth: 1941/08/04  Transition of [facility]) CM/SW Contact:    Diantha Paxson, Chauncey Reading, RN Phone Number: 03/06/2020, 11:45 AM  Clinical Narrative:       Hip fracture. Discussed SNF recommendation with wife, agreeable to referrals to all local area SNF's.             Expected Discharge Plan: Skilled Nursing Facility Barriers to Discharge: Continued Medical Work up   Patient Goals and CMS Choice Patient states their goals for this hospitalization and ongoing recovery are:: wife interested in rehab CMS Medicare.gov Compare Post Acute Care list provided to:: Other (Comment Required)(wife- patient sleepy d/t pain medication) Choice offered to / list presented to : Spouse  Expected Discharge Plan and Services Expected Discharge Plan: [place]   Discharge Planning Services: CM Consult   Living arrangements for the past 2 months: Single Family Home                                      Prior Living Arrangements/Services Living arrangements for the past 2 months: Single Family Home Lives with:: Spouse              Current home services: DME    Activities of Daily Living Home Assistive Devices/Equipment: Cane (specify quad or straight), Walker (specify type) ADL Screening (condition at time of admission) Patient's cognitive ability adequate to safely complete daily activities?: Yes Is the patient deaf or have difficulty hearing?: No Does the patient have difficulty seeing, even when wearing glasses/contacts?: No Does the patient have difficulty concentrating, remembering, or making decisions?: No Patient able to express need for assistance with ADLs?: Yes Does the patient have difficulty dressing or bathing?: No Independently performs ADLs?: Yes (appropriate for developmental age) Does the patient have difficulty walking or climbing  stairs?: Yes Weakness of Legs: Both Weakness of Arms/Hands: None  Permission Sought/Granted                  Emotional Assessment              Admission diagnosis:  Left displaced femoral neck fracture ([place]) [S72.002A] S/p left hip fracture [Z87.81] Patient Active Problem List   Diagnosis Date Noted  . Displaced intertrochanteric fracture of left femur, initial encounter for closed fracture ([place])   . S/p left hip fracture 03/03/2020  . Benign prostatic hyperplasia with lower urinary tract symptoms 06/04/2019  . Urge incontinence 06/04/2019  . History of stroke 06/02/2019  . Medicare annual wellness visit, subsequent 06/02/2019  . Urinary incontinence 10/28/2018  . Polyuria 10/28/2018  . Dermatitis 10/28/2018  . Tinea cruris 03/25/2018  . Tick bite 03/25/2018  . Balanitis 03/25/2018  . At moderate risk for fall 12/31/2017  . Alcohol abuse 12/29/2017  . History of pulmonary embolism 12/29/2017  . Depression, recurrent ([place]) 12/29/2017  . Alcohol intoxication ([place]) 12/16/2017  . Wears hearing aid 06/09/2017  . Tinnitus of both ears 06/09/2017  . Bilateral impacted cerumen 06/09/2017  . Left wrist pain 06/26/2016  . Morning joint stiffness 06/26/2016  . Polyarthralgia 06/26/2016  . Gait disturbance 04/02/2016  . Physical deconditioning 04/02/2016  . Pain of both shoulder joints 04/02/2016  . Leg weakness, bilateral 04/02/2016  . Encounter for therapeutic drug monitoring 11/29/2015  . Encounter for health maintenance examination in adult 10/30/2015  .  Shoulder pain, bilateral 10/30/2015  . Muscle weakness 10/30/2015  . Generalized weakness 10/30/2015  . Rash 10/30/2015  . PVC (premature ventricular contraction) 10/10/2015  . Decreased energy 06/20/2015  . DVT (deep venous thrombosis) ([place]) 06/20/2015  . Hammer toe of left foot 06/20/2015  . Pre-ulcerative calluses 06/20/2015  . Hypertrophic toenail 06/20/2015  . Diabetic mononeuropathy associated with  diabetes mellitus due to underlying condition ([place]) 06/20/2015  . Leg pain, bilateral 06/20/2015  . Chronic low back pain 06/20/2015  . 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 06/14/2015  . OSA on CPAP 03/06/2015  . Edema 03/06/2015  . Leg pain, diffuse 03/06/2015  . Bilateral low back pain without sciatica 03/06/2015  . Tremor 03/06/2015  . Insomnia 03/06/2015  . Vaccine counseling 03/06/2015  . Chronic back pain 03/06/2015  . Wears glasses 03/06/2015  . Hearing loss 03/06/2015  . Spinal stenosis of lumbar region 03/06/2015  . Ptosis 03/06/2015  . Positive depression screening 03/06/2015  . Advance directive discussed with patient 03/06/2015  . Bradycardia 02/27/2015  . Current use of long term anticoagulation   . Dementia without behavioral disturbance ([place])   . Essential hypertension 02/08/2015  . Hyperlipidemia 02/08/2015  . OSA (obstructive sleep apnea) 02/08/2015  . Memory loss 03/12/2013  . Diabetes mellitus ([place]) 03/12/2013   PCP:  Carlena Hurl, PA-C Pharmacy:   [facility], [place] - [address] [place] [address] [place] [address] [place] [address] Phone: [phone] Fax: [phone]     Social Determinants of Health (SDOH) Interventions    Readmission Risk Interventions No flowsheet data found.

## 2020-03-06 NOTE — [place] FL2 (Signed)
[place] MEDICAID FL2 LEVEL OF CARE SCREENING TOOL     IDENTIFICATION  Patient Name: Christian [name] Birthdate: Aug 22, 1941 Sex: male Admission Date (Current Location): 03/03/2020  [facility] [place] Number:  [employer] and Address:  [place] [address] [place]      Provider Number: [id number]  Attending Physician Name and Address:  Kayleen Memos, DO  Relative Name and Phone Number:       Current Level of Care: Hospital Recommended Level of Care: [place] Prior Approval Number:    Date Approved/Denied:   PASRR Number:    Discharge Plan: SNF    Current Diagnoses: Patient Active Problem List   Diagnosis Date Noted  . Displaced intertrochanteric fracture of left femur, initial encounter for closed fracture ([place])   . S/p left hip fracture 03/03/2020  . Benign prostatic hyperplasia with lower urinary tract symptoms 06/04/2019  . Urge incontinence 06/04/2019  . History of stroke 06/02/2019  . Medicare annual wellness visit, subsequent 06/02/2019  . Urinary incontinence 10/28/2018  . Polyuria 10/28/2018  . Dermatitis 10/28/2018  . Tinea cruris 03/25/2018  . Tick bite 03/25/2018  . Balanitis 03/25/2018  . At moderate risk for fall 12/31/2017  . Alcohol abuse 12/29/2017  . History of pulmonary embolism 12/29/2017  . Depression, recurrent ([place]) 12/29/2017  . Alcohol intoxication ([place]) 12/16/2017  . Wears hearing aid 06/09/2017  . Tinnitus of both ears 06/09/2017  . Bilateral impacted cerumen 06/09/2017  . Left wrist pain 06/26/2016  . Morning joint stiffness 06/26/2016  . Polyarthralgia 06/26/2016  . Gait disturbance 04/02/2016  . Physical deconditioning 04/02/2016  . Pain of both shoulder joints 04/02/2016  . Leg weakness, bilateral 04/02/2016  . Encounter for therapeutic drug monitoring 11/29/2015  . Encounter for health maintenance examination in adult 10/30/2015  . Shoulder pain, bilateral  10/30/2015  . Muscle weakness 10/30/2015  . Generalized weakness 10/30/2015  . Rash 10/30/2015  . PVC (premature ventricular contraction) 10/10/2015  . Decreased energy 06/20/2015  . DVT (deep venous thrombosis) ([place]) 06/20/2015  . Hammer toe of left foot 06/20/2015  . Pre-ulcerative calluses 06/20/2015  . Hypertrophic toenail 06/20/2015  . Diabetic mononeuropathy associated with diabetes mellitus due to underlying condition ([place]) 06/20/2015  . Leg pain, bilateral 06/20/2015  . Chronic low back pain 06/20/2015  . 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 06/14/2015  . OSA on CPAP 03/06/2015  . Edema 03/06/2015  . Leg pain, diffuse 03/06/2015  . Bilateral low back pain without sciatica 03/06/2015  . Tremor 03/06/2015  . Insomnia 03/06/2015  . Vaccine counseling 03/06/2015  . Chronic back pain 03/06/2015  . Wears glasses 03/06/2015  . Hearing loss 03/06/2015  . Spinal stenosis of lumbar region 03/06/2015  . Ptosis 03/06/2015  . Positive depression screening 03/06/2015  . Advance directive discussed with patient 03/06/2015  . Bradycardia 02/27/2015  . Current use of long term anticoagulation   . Dementia without behavioral disturbance ([place])   . Essential hypertension 02/08/2015  . Hyperlipidemia 02/08/2015  . OSA (obstructive sleep apnea) 02/08/2015  . Memory loss 03/12/2013  . Diabetes mellitus ([place]) 03/12/2013    Orientation RESPIRATION BLADDER Height & Weight     Self, Time, Situation, Place  O2(see DC summary) Continent Weight: 107.5 kg Height:  5\' 11"  (180.3 cm)  BEHAVIORAL SYMPTOMS/MOOD NEUROLOGICAL BOWEL NUTRITION STATUS      Continent Diet(see DC summary)  AMBULATORY STATUS COMMUNICATION OF NEEDS Skin  Extensive Assist Verbally Normal                       Personal Care Assistance Level of Assistance  Bathing, Dressing, Feeding Bathing Assistance: Maximum assistance Feeding assistance: Limited  assistance Dressing Assistance: Maximum assistance     Functional Limitations Info  Sight, Hearing, Speech Sight Info: Adequate Hearing Info: Adequate Speech Info: Adequate    SPECIAL CARE FACTORS FREQUENCY  PT (By licensed PT)     PT Frequency: 5x/week              Contractures Contractures Info: Not present    Additional Factors Info  Code Status, Allergies Code Status Info: DNR Allergies Info: BEE VENOM, ULTRAM, OXYCODONE, OXYCONTIN           Current Medications (03/06/2020):  This is the current hospital active medication list Current Facility-Administered Medications  Medication Dose Route Frequency Provider Last Rate Last Admin  . 0.9 %  sodium chloride infusion   Intravenous Continuous Carole Civil, MD 75 mL/hr at 03/06/20 0500 Rate Verify at 03/06/20 0500  . bisacodyl (DULCOLAX) EC tablet 5 mg  5 mg Oral Daily PRN Carole Civil, MD      . diltiazem (CARDIZEM) tablet 30 mg  30 mg Oral BID Carole Civil, MD   30 mg at 03/06/20 1050  . docusate sodium (COLACE) capsule 100 mg  100 mg Oral BID Carole Civil, MD   100 mg at 03/06/20 1050  . donepezil (ARICEPT) tablet 10 mg  10 mg Oral QHS Carole Civil, MD   10 mg at 03/05/20 2228  . fesoterodine (TOVIAZ) tablet 8 mg  8 mg Oral Daily Carole Civil, MD   8 mg at 03/06/20 1050  . flecainide (TAMBOCOR) tablet 75 mg  75 mg Oral Q12H Carole Civil, MD   75 mg at 03/06/20 1050  . HYDROcodone-acetaminophen (NORCO/VICODIN) 5-325 MG per tablet 1-2 tablet  1-2 tablet Oral Q4H PRN Carole Civil, MD   2 tablet at 03/05/20 1855  . insulin aspart (novoLOG) injection 0-20 Units  0-20 Units Subcutaneous TID WC Carole Civil, MD   4 Units at 03/06/20 0801  . insulin aspart (novoLOG) injection 0-5 Units  0-5 Units Subcutaneous QHS Carole Civil, MD   2 Units at 03/05/20 2237  . insulin detemir (LEVEMIR) injection 65 Units  65 Units Subcutaneous QHS Carole Civil, MD   65  Units at 03/05/20 2238  . memantine (NAMENDA XR) 24 hr capsule 28 mg  28 mg Oral Daily Carole Civil, MD   28 mg at 03/06/20 1050  . menthol-cetylpyridinium (CEPACOL) lozenge 3 mg  1 lozenge Oral PRN Carole Civil, MD       Or  . phenol (CHLORASEPTIC) mouth spray 1 spray  1 spray Mouth/Throat PRN Carole Civil, MD      . metoCLOPramide (REGLAN) tablet 5-10 mg  5-10 mg Oral Q8H PRN Carole Civil, MD       Or  . metoCLOPramide (REGLAN) injection 5-10 mg  5-10 mg Intravenous Q8H PRN Carole Civil, MD      . morphine 2 MG/ML injection 2 mg  2 mg Intravenous Q2H PRN Carole Civil, MD   2 mg at 03/06/20 1058  . ondansetron (ZOFRAN) tablet 4 mg  4 mg Oral Q6H PRN Carole Civil, MD       Or  . ondansetron [facility]) injection 4 mg  4  mg Intravenous Q6H PRN Carole Civil, MD      . polyethylene glycol (MIRALAX / GLYCOLAX) packet 17 g  17 g Oral Daily PRN Carole Civil, MD      . rivaroxaban [facility]) tablet 20 mg  20 mg Oral q1800 Carole Civil, MD   20 mg at 03/05/20 2229  . rosuvastatin (CRESTOR) tablet 10 mg  10 mg Oral Daily Carole Civil, MD   10 mg at 03/06/20 1050  . senna-docusate (Senokot-S) tablet 1 tablet  1 tablet Oral QHS PRN Carole Civil, MD      . sodium phosphate (FLEET) 7-19 GM/118ML enema 1 enema  1 enema Rectal Once PRN Carole Civil, MD      . tamsulosin [facility]) capsule 0.4 mg  0.4 mg Oral Daily Carole Civil, MD   0.4 mg at 03/06/20 1050     Discharge Medications: Please see discharge summary for a list of discharge medications.  Relevant Imaging Results:  Relevant Lab Results:   Additional Information [phone]  Wyley Hack, Chauncey Reading, RN

## 2020-03-06 NOTE — Progress Notes (Signed)
OT Cancellation Note  Patient Details Name: Christian [name] MRN: [id number] DOB: Jul 11, 1941   Cancelled Treatment:    Reason Eval/Treat Not Completed: Fatigue/lethargy limiting ability to participate. Pt had just finished with PT for evaluation upon arrival. Patient received Morphine for pain management and is unable to participate in OT evaluation. PT reported that patient was unable to transfer to the chair. He had max difficulty sitting on the edge of the bed. Required two person assist to transition to supine from sitting.  Will re-attempt OT evaluation when patient is able to participate.    Ailene Ravel, OTR/L,CBIS  [phone]  03/06/2020, 9:19 AM

## 2020-03-06 NOTE — Plan of Care (Signed)
  Problem: Acute Rehab PT Goals(only PT should resolve) Goal: Pt Will Go Supine/Side To Sit Outcome: Progressing Flowsheets (Taken 03/06/2020 1122) Pt will go Supine/Side to Sit:  with minimal assist  with moderate assist Goal: Patient Will Transfer Sit To/From Stand Outcome: Progressing Flowsheets (Taken 03/06/2020 1122) Patient will transfer sit to/from stand: with moderate assist Goal: Pt Will Transfer Bed To Chair/Chair To Bed Outcome: Progressing Flowsheets (Taken 03/06/2020 1122) Pt will Transfer Bed to Chair/Chair to Bed: with mod assist Goal: Pt Will Ambulate Outcome: Progressing Flowsheets (Taken 03/06/2020 1122) Pt will Ambulate:  15 feet  with moderate assist  with rolling walker   11:22 AM, 03/06/20 Lonell Grandchild, MPT Physical Therapist with [facility] [phone] office [phone] mobile phone

## 2020-03-06 NOTE — Progress Notes (Signed)
PROGRESS NOTE  Christian [name] [id number] DOB: 11/25/1941 DOA: 03/03/2020 PCP: Carlena Hurl, PA-C  HPI/Recap of past 24 hours: 79 y.o.malewith a history of PE and DVT secondary to prothrombin gene, currently on anticoagulation, type 2 diabetes, hypertension, dementia, depression. Presented from home with a fall.  He was attempting to ambulate and fell when he tried to get out of his chair. He landed on his left hip. Hip x-ray shows left femoral neck fracture that is comminuted, displaced, and angulated. Seen by Orthopedics post left hip repair on 03/05/20 by Dr. Aline Brochure.  03/06/20:  Pleasantly confused.  Denies any pain.  Evaluated by PT with recommendations for SNF.  TOC assisting with SNF placement.  Back on Xarelto.  H&H is stable.  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   Physical deconditioning   Alcohol abuse   History of pulmonary embolism   Depression, recurrent (H[facility])   History of stroke   Benign prostatic hyperplasia with lower urinary tract symptoms   Displaced intertrochanteric fracture of left femur, initial encounter for closed fracture ([place])   1-Left hip fracture post repair 03/05/20 by Dr. Aline Brochure: Following mechanical fall prior to admission. Post left hip repair 03/05/2020. -Continue analgesics as needed -Continue supportive care. -PT recs SNF -TOC assisting with SNF placement  2-uncontrolled type 2 diabetes with hyperglycemia -Continue holding oral hypoglycemic agents while inpatient -Continue sliding scale insulin and Lantus -Follow CBGs and further adjust hypoglycemic regimen as required based on CBGs fluctuation.  3-Essential hypertension -BP at goal -Overall stable and well-controlled -Continue current  antihypertensive regimens.  4-Dementia without behavioral disturbance (HCC) -Continue Aricept and Namenda -Constant reorientation -Stable mood currently.  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 -After surgical intervention. -Continue current rate control/antiarrhythmic agents. -Continue monitoring on telemetry.  6-Alcohol abuse -No prior history of withdrawal -Continue close monitoring.  7-History of ulcer nonhemorrhagic stroke -No focal deficits appreciated -Continue risk factor modifications and resumption of anticoagulation for secondary prevention after surgical intervention.  8-Benign prostatic hyperplasia with lower urinary tract symptoms -No complaints of urinary retention -Continue Flomax.  9. Acute blood loss post surgery Hg stable 10.0 from 10.5 Baseline hg 12 Continue monitor H&H  DVT prophylaxis: Xarelto Code Status: DNR Family Communication: No family at bedside.  Disposition Plan: patient is from home.  Anticipate dc to SNF.  Barrier to dc: placement.  Consultants:   Orthopedic service (Dr. Aline Brochure).  Procedures:    left hip surgery on 03/05/2020.  Antimicrobials:             Anti-infectives (From admission, onward)     Start     Dose/Rate Route Frequency Ordered Stop   03/05/20 1600  ceFAZolin (ANCEF) IVPB 2g/100 mL premix     2 g 200 mL/hr over 30 Minutes Intravenous Every 6 hours 03/05/20 1239 03/06/20 0359   03/05/20 0600  ceFAZolin (ANCEF) IVPB 2g/100 mL premix     2 g 200 mL/hr over 30 Minutes Intravenous On call to O.R. 03/04/20 2249 03/05/20 0951          Objective: Vitals:   03/05/20 2205 03/06/20 0432 03/06/20 1050 03/06/20 1430  BP:  (!) 131/46 (!) 142/60 128/63  Pulse: 86 73  72  Resp: 16 20  20  Temp:  99.1 F (37.3 C)  99 F (37.2 C)  TempSrc:  Oral  Oral  SpO2: 94% 97%  97%  Weight:      Height:        Intake/Output  Summary (Last 24 hours) at 03/06/2020 1608 Last data filed at 03/06/2020 1500 Gross per 24 hour  Intake 2380.9 ml  Output 950 ml  Net 1430.9 ml   Filed Weights   03/03/20 1750 03/03/20 2214  Weight: 108 kg 107.5 kg    Exam:  . General: 79 y.o. year-old male well developed well nourished in no acute distress.  Alert and pleasantly confused in a state of dementia. . Cardiovascular: Regular rate and rhythm with no rubs or gallops.  No thyromegaly or JVD noted.   [name] Respiratory: Clear to auscultation with no wheezes or rales. Good inspiratory effort. . Abdomen: Soft nontender nondistended with normal bowel sounds x4 quadrants. . Musculoskeletal: No lower extremity pitting edema. Left hip dressing clean with no drainage. [name] Psychiatry: Mood is appropriate for condition and setting   Data Reviewed: CBC: Recent Labs  Lab 03/03/20 1811 03/04/20 0649 03/05/20 1616 03/06/20 0448  WBC 9.7 11.6*  --  9.8  HGB 14.1 12.0* 10.5* 10.0*  HCT 44.8 38.3* 32.9* 32.0*  MCV 94.5 94.8  --  95.2  PLT 227 249  --  [id number]   Basic Metabolic Panel: Recent Labs  Lab 03/03/20 1811 03/04/20 0649 03/06/20 0448  NA 136 137 137  K 4.4 4.3 4.0  CL 104 102 103  CO2 23 23 26   GLUCOSE 321* 296* 202*  BUN 17 17 12   CREATININE 0.88 0.75 0.67  CALCIUM 8.7* 8.8* 8.2*   GFR: Estimated Creatinine Clearance: 98 mL/min (by C-G formula based on SCr of 0.67 mg/dL). Liver Function Tests: Recent Labs  Lab 03/03/20 1811  AST 17  ALT 15  ALKPHOS 86  BILITOT 0.5  PROT 6.7  ALBUMIN 3.5   No results for input(s): LIPASE, AMYLASE in the last 168 hours. No results for input(s): AMMONIA in the last 168 hours. Coagulation Profile: Recent Labs  Lab 03/04/20 0649  INR 1.2   Cardiac Enzymes: No results for input(s): CKTOTAL, CKMB, CKMBINDEX, TROPONINI in the last 168 hours. BNP (last 3 results) No results for input(s): PROBNP in the last 8760 hours. HbA1C: Recent Labs    03/04/20 0649  HGBA1C 10.9*    CBG: Recent Labs  Lab 03/05/20 1135 03/05/20 1622 03/05/20 2236 03/06/20 0722 03/06/20 1116  GLUCAP 183* 188* 230* 186* 237*   Lipid Profile: No results for input(s): CHOL, HDL, LDLCALC, TRIG, CHOLHDL, LDLDIRECT in the last 72 hours. Thyroid Function Tests: No results for input(s): TSH, T4TOTAL, FREET4, T3FREE, THYROIDAB in the last 72 hours. Anemia Panel: No results for input(s): VITAMINB12, FOLATE, FERRITIN, TIBC, IRON, RETICCTPCT in the last 72 hours. Urine analysis:    Component Value Date/Time   COLORURINE YELLOW 05/12/2019 0008   APPEARANCEUR CLEAR 05/12/2019 0008   LABSPEC 1.024 05/12/2019 0008   LABSPEC 1.015 10/28/2018 1522   PHURINE 5.0 05/12/2019 0008   GLUCOSEU >=500 (A) 05/12/2019 0008   HGBUR NEGATIVE 05/12/2019 0008   BILIRUBINUR NEGATIVE 05/12/2019 0008   BILIRUBINUR negative 10/28/2018 1522   BILIRUBINUR n 05/10/2016 1500   KETONESUR 5 (A) 05/12/2019 0008   PROTEINUR NEGATIVE 05/12/2019 0008   UROBILINOGEN negative 05/10/2016 1500   UROBILINOGEN 0.2 01/31/2015 1505   NITRITE NEGATIVE 05/12/2019 0008   LEUKOCYTESUR NEGATIVE 05/12/2019 0008   Sepsis Labs: @LABRCNTIP (procalcitonin:4,lacticidven:4)  ) Recent  Results (from the past 240 hour(s))  SARS CORONAVIRUS 2 (TAT 6-24 HRS) Nasopharyngeal Nasopharyngeal Swab     Status: None   Collection Time: 03/03/20  6:28 PM   Specimen: Nasopharyngeal Swab  Result Value Ref Range Status   SARS Coronavirus 2 NEGATIVE NEGATIVE Final    Comment: (NOTE) SARS-CoV-2 target nucleic acids are NOT DETECTED. The SARS-CoV-2 RNA is generally detectable in upper and lower respiratory specimens during the acute phase of infection. Negative results do not preclude SARS-CoV-2 infection, do not rule out co-infections with other pathogens, and should not be used as the sole basis for treatment or other patient management decisions. Negative results must be combined with clinical observations, patient history, and  epidemiological information. The expected result is Negative. Fact Sheet for Patients: SugarRoll.be Fact Sheet for Healthcare Providers: https://www.woods-mathews.com/ This test is not yet approved or cleared by the [place] FDA and  has been authorized for detection and/or diagnosis of SARS-CoV-2 by FDA under an Emergency Use Authorization (EUA). This EUA will remain  in effect (meaning this test can be used) for the duration of the COVID-19 declaration under Section 56 4(b)(1) of the Act, 21 U.S.C. section 360bbb-3(b)(1), unless the authorization is terminated or revoked sooner. Performed at [facility], [place] [address], [place], [place] [address]   MRSA PCR Screening     Status: None   Collection Time: 03/04/20 11:31 AM   Specimen: Nasal Mucosa; Nasopharyngeal  Result Value Ref Range Status   MRSA by PCR NEGATIVE NEGATIVE Final    Comment:        The GeneXpert MRSA Assay (FDA approved for NASAL specimens only), is one component of a comprehensive MRSA colonization surveillance program. It is not intended to diagnose MRSA infection nor to guide or monitor treatment for MRSA infections. Performed at [facility], [address], [place], [place] [address]       Studies: No results found.  Scheduled Meds: . diltiazem  30 mg Oral BID  . docusate sodium  100 mg Oral BID  . donepezil  10 mg Oral QHS  . fesoterodine  8 mg Oral Daily  . flecainide  75 mg Oral Q12H  . insulin aspart  0-20 Units Subcutaneous TID WC  . insulin aspart  0-5 Units Subcutaneous QHS  . insulin detemir  65 Units Subcutaneous QHS  . memantine  28 mg Oral Daily  . rivaroxaban  20 mg Oral q1800  . rosuvastatin  10 mg Oral Daily  . tamsulosin  0.4 mg Oral Daily    Continuous Infusions: . sodium chloride 75 mL/hr at 03/06/20 0500     LOS: 3 days     Kayleen Memos, MD [facility] Pager [phone]  If 7PM-7AM, please contact  night-coverage [email or web address] Password [facility] 03/06/2020, 4:08 PM

## 2020-03-06 NOTE — Evaluation (Signed)
Physical Therapy Evaluation Patient Details Name: Christian [name] MRN: [id number] DOB: 1941/04/05 Today's Date: 03/06/2020   History of Present Illness  Christian [name] is a 79 y.o. male s/p INTRAMEDULLARY (IM) NAIL INTERTROCHANTRIC (Left) on 03/05/20 with a history of PE and DVT secondary to prothrombin gene, currently on anticoagulation, type 2 diabetes with poor control, hypertension, dementia, depression.  Patient seen for fall that occurred earlier today.  He was attempting to ambulate and fell when he tried to get out of his chair.  He landed on his left hip and had immediate pain that is nonradiating.  Pain worse with movement and improved with rest.  EMS was called and the patient was taken to the hospital for evaluation.  In conversing with wife, the patient has "given up on life".  He has had limited mobility, eats what he wants, does not take check his blood sugars.  Patient mostly sits in chair or is in bed.  He has had severe deconditioning has been ongoing for the past few months.    Clinical Impression  Patient requires repeated verbal/tactile cueing to follow most instructions, demonstrates slow labored movement for sitting up at bedside with poor tolerance for bearing weight on left hip resulting in frequent leaning/falling over to the right, unable to stand due to weakness, shaking of BUE and poor carryover for following instructions.  Patient required 2 person Max assist to put back to bed.  Patient will benefit from continued physical therapy in hospital and recommended venue below to increase strength, balance, endurance for safe ADLs and gait.    Follow Up Recommendations SNF    Equipment Recommendations  None recommended by PT    Recommendations for Other Services       Precautions / Restrictions Precautions Precautions: Fall Restrictions Weight Bearing Restrictions: Yes LLE Weight Bearing: Weight bearing as tolerated      Mobility  Bed Mobility Overal bed  mobility: Needs Assistance Bed Mobility: Supine to Sit;Sit to Supine     Supine to sit: Max assist Sit to supine: Max assist   General bed mobility comments: slow labored movement with frequent leaning to the right due to left hip pain  Transfers                    Ambulation/Gait                Stairs            Wheelchair Mobility    Modified Rankin (Stroke Patients Only)       Balance Overall balance assessment: Needs assistance Sitting-balance support: Feet supported;No upper extremity supported Sitting balance-Leahy Scale: Poor Sitting balance - Comments: frequent leaning to the right due to left hip pain Postural control: Right lateral lean                                   Pertinent Vitals/Pain Pain Assessment: Faces Faces Pain Scale: Hurts whole lot Pain Location: left hip Pain Descriptors / Indicators: Sore;Grimacing;Guarding;Aching Pain Intervention(s): Limited activity within patient's tolerance;Monitored during session;Premedicated before session    Home Living Family/patient expects to be discharged to:: Private residence Living Arrangements: Spouse/significant other Available Help at Discharge: Family Type of Home: House Home Access: Stairs to enter Entrance Stairs-Rails: None Entrance Stairs-Number of Steps: 1 Home Layout: Multi-level;Able to live on main level with bedroom/bathroom Home Equipment: Gilford Rile - 2 wheels;Cane - single point;Shower seat -  built in;Bedside commode Additional Comments: Sleeps in lounge chair at home    Prior Function Level of Independence: Independent with assistive device(s)         Comments: household ambulator using RW     Hand Dominance   Dominant Hand: Right    Extremity/Trunk Assessment   Upper Extremity Assessment Upper Extremity Assessment: Generalized weakness    Lower Extremity Assessment Lower Extremity Assessment: Generalized weakness;LLE deficits/detail LLE  Deficits / Details: grossly -3/5 LLE: Unable to fully assess due to pain LLE Sensation: WNL LLE Coordination: WNL    Cervical / Trunk Assessment Cervical / Trunk Assessment: Normal  Communication   Communication: No difficulties  Cognition Arousal/Alertness: Awake/alert Behavior During Therapy: WFL for tasks assessed/performed Overall Cognitive Status: Within Functional Limits for tasks assessed                                 General Comments: requires repeated verbal/tactile cueing to complete functional tasks      General Comments      Exercises     Assessment/Plan    PT Assessment Patient needs continued PT services  PT Problem List Decreased strength;Decreased activity tolerance;Decreased balance;Decreased mobility       PT Treatment Interventions Balance training;Gait training;Stair training;Functional mobility training;Therapeutic activities;Therapeutic exercise;Patient/family education    PT Goals (Current goals can be found in the Care Plan section)  Acute Rehab PT Goals Patient Stated Goal: return home after rehab PT Goal Formulation: With patient Time For Goal Achievement: 03/20/20 Potential to Achieve Goals: Fair    Frequency Min 4X/week   Barriers to discharge        Co-evaluation               AM-PAC PT "6 Clicks" Mobility  Outcome Measure Help needed turning from your back to your side while in a flat bed without using bedrails?: A Lot Help needed moving from lying on your back to sitting on the side of a flat bed without using bedrails?: A Lot Help needed moving to and from a bed to a chair (including a wheelchair)?: Total Help needed standing up from a chair using your arms (e.g., wheelchair or bedside chair)?: Total Help needed to walk in hospital room?: Total Help needed climbing 3-5 steps with a railing? : Total 6 Click Score: 8    End of Session   Activity Tolerance: Patient tolerated treatment well;Patient limited  by fatigue;Patient limited by pain Patient left: in bed;with call bell/phone within reach;with bed alarm set Nurse Communication: Mobility status PT Visit Diagnosis: Unsteadiness on feet (R26.81);Other abnormalities of gait and mobility (R26.89);Muscle weakness (generalized) (M62.81)    Time: [id number] PT Time Calculation (min) (ACUTE ONLY): 41 min   Charges:   PT Evaluation $PT Eval High Complexity: 1 High PT Treatments $Therapeutic Activity: 38-52 mins        11:20 AM, 03/06/20 Lonell Grandchild, MPT Physical Therapist with [facility] [phone] office [phone] mobile phone

## 2020-03-06 NOTE — Plan of Care (Signed)
  Problem: Education: Goal: Knowledge of General Education information will improve Description: Including pain rating scale, medication(s)/side effects and non-pharmacologic comfort measures Outcome: Progressing   Problem: Clinical Measurements: Goal: Will remain free from infection Outcome: Progressing   Problem: Clinical Measurements: Goal: Cardiovascular complication will be avoided Outcome: Progressing   Problem: Clinical Measurements: Goal: Respiratory complications will improve Outcome: Progressing   Problem: Activity: Goal: Risk for activity intolerance will decrease Outcome: Progressing   Problem: Pain Managment: Goal: General experience of comfort will improve Outcome: Progressing   Problem: Safety: Goal: Ability to remain free from injury will improve Outcome: Progressing   Problem: Skin Integrity: Goal: Risk for impaired skin integrity will decrease Outcome: Progressing

## 2020-03-06 NOTE — Evaluation (Signed)
Clinical/Bedside Swallow Evaluation Patient Details  Name: Christian [name] MRN: [id number] Date of Birth: [birth year]  Today's Date: 03/06/2020 Time: SLP Start Time (ACUTE ONLY): [id number] SLP Stop Time (ACUTE ONLY): 1411 SLP Time Calculation (min) (ACUTE ONLY): 38 min  Past Medical History:  Past Medical History:  Diagnosis Date  . Agitation   . Arthritis   . Cataract   . Chronic back pain   . Confusion   . Diabetes ([place])    type 2  . DVT (deep venous thrombosis) ([place]) 01/2015  . Hearing impaired   . High cholesterol   . Hypertension   . Insomnia   . Insomnia   . Leg pain, diffuse   . Leg swelling   . Memory loss   . Mild cognitive impairment    sees [facility] Neu[facility]  . Numbness    fingers, feet, toes  . OSA on CPAP   . 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 06/14/2015  . Pulmonary embolism ([place]) 01/2015  . Spinal stenosis    lumbar  . Tremor    on propranolol  . Wears glasses    Past Surgical History:  Past Surgical History:  Procedure Laterality Date  . CATARACT EXTRACTION    . COLONOSCOPY  2014  . INTRAMEDULLARY (IM) NAIL INTERTROCHANTERIC Left 03/05/2020   Procedure: INTRAMEDULLARY (IM) NAIL INTERTROCHANTRIC;  Surgeon: Carole Civil, MD;  Location: [facility];  Service: Orthopedics;  Laterality: Left;  . TONSILLECTOMY     HPI:  Christian [name] is a 79 y.o. male s/p INTRAMEDULLARY (IM) NAIL INTERTROCHANTRIC (Left) on 03/05/20 with a history of PE and DVT secondary to prothrombin gene, currently on anticoagulation, type 2 diabetes with poor control, hypertension, dementia, depression.  Patient seen for fall that occurred earlier today.  He was attempting to ambulate and fell when he tried to get out of his chair.  He landed on his left hip and had immediate pain that is nonradiating.  Pain worse with movement and improved with rest.  EMS was called and the patient was taken to the hospital for evaluation.  In conversing  with wife, the patient has "given up on life".  He has had limited mobility, eats what he wants, does not take check his blood sugars.  Patient mostly sits in chair or is in bed.  He has had severe deconditioning has been ongoing for the past few months.    Assessment / Plan / Recommendation Clinical Impression  Pt presents with mild oral phase and suspected mild pharyngeal phase dysphagia characterized by reduced lingual strength resulting in lingual residuals post swallow which clear with repeat swallows and liquid wash; occasional delayed throat clearing and dry cough after regular textures and thin liquids. Pt is not yet back to baseline per family (spoke to wife on the phone). She also indicates that Pt does cough at times with meals, but has had no recent diagnosis of PNA/URI. Current chest x-ray shows no acute changes. Recommend D3/mech soft with thin liquids with aspiration and reflux precautions. Pt is currently requiring assist for feeding due to intermittent lethargy and hand tremors. SLP will follow during acute stay.   SLP Visit Diagnosis: Dysphagia, unspecified (R13.10)    Aspiration Risk  Mild aspiration risk    Diet Recommendation Dysphagia 3 (Mech soft);Thin liquid   Liquid Administration via: Cup;Straw Medication Administration: Whole meds with liquid Supervision: Full supervision/cueing for compensatory strategies;Staff to assist with self feeding Compensations: Slow rate;Small sips/bites;Multiple dry swallows  after each bite/sip Postural Changes: Seated upright at 90 degrees;Remain upright for at least 30 minutes after po intake    Other  Recommendations Oral Care Recommendations: Oral care BID;Staff/trained caregiver to provide oral care Other Recommendations: Clarify dietary restrictions   Follow up Recommendations 24 hour supervision/assistance      Frequency and Duration min 2x/week  1 week       Prognosis Prognosis for Safe Diet Advancement: Fair Barriers to  Reach Goals: (wife reports h/o coughing with po intake, no reports of PNA)      Swallow Study   General Date of Onset: 03/03/20 HPI: Christian [name] is a 79 y.o. male s/p INTRAMEDULLARY (IM) NAIL INTERTROCHANTRIC (Left) on 03/05/20 with a history of PE and DVT secondary to prothrombin gene, currently on anticoagulation, type 2 diabetes with poor control, hypertension, dementia, depression.  Patient seen for fall that occurred earlier today.  He was attempting to ambulate and fell when he tried to get out of his chair.  He landed on his left hip and had immediate pain that is nonradiating.  Pain worse with movement and improved with rest.  EMS was called and the patient was taken to the hospital for evaluation.  In conversing with wife, the patient has "given up on life".  He has had limited mobility, eats what he wants, does not take check his blood sugars.  Patient mostly sits in chair or is in bed.  He has had severe deconditioning has been ongoing for the past few months.  Type of Study: Bedside Swallow Evaluation Previous Swallow Assessment: None on record Diet Prior to this Study: Other (Comment)(full liquids) Temperature Spikes Noted: No Respiratory Status: Nasal cannula History of Recent Intubation: Yes Behavior/Cognition: Alert;Cooperative;Pleasant mood;Requires cueing Oral Cavity Assessment: Within Functional Limits Oral Care Completed by SLP: Yes Oral Cavity - Dentition: Missing dentition Vision: Functional for self-feeding Self-Feeding Abilities: Able to feed self;Needs set up Patient Positioning: Upright in bed Baseline Vocal Quality: Normal Volitional Cough: Strong Volitional Swallow: Able to elicit    Oral/Motor/Sensory Function Overall Oral Motor/Sensory Function: Mild impairment Lingual Symmetry: Within Functional Limits Lingual Strength: Reduced Lingual Sensation: Within Functional Limits Velum: Within Functional Limits Mandible: Within Functional Limits   Ice Chips Ice  chips: Within functional limits Presentation: Spoon   Thin Liquid Thin Liquid: Impaired Presentation: Cup;Self Fed;Straw;Spoon Pharyngeal  Phase Impairments: Suspected delayed Swallow;Multiple swallows;Cough - Delayed    Nectar Thick Nectar Thick Liquid: Within functional limits Presentation: Cup   Honey Thick Honey Thick Liquid: Not tested   Puree Puree: Impaired Presentation: Self Fed Oral Phase Impairments: Reduced lingual movement/coordination Oral Phase Functional Implications: Oral residue(min lingual residue)   Solid     Solid: Within functional limits(delayed cough after liquid wash for crackers) Presentation: Self Fed     Thank you,  Genene Churn, [place]  Kostantinos Tallman 03/06/2020,2:12 PM

## 2020-03-07 ENCOUNTER — Inpatient Hospital Stay
Admission: RE | Admit: 2020-03-07 | Discharge: 2020-03-11 | Disposition: A | Discharge status: Nursing Home (Includes ICF) | Payer: Medicare Other | Source: Ambulatory Visit | Attending: Internal Medicine | Admitting: Internal Medicine

## 2020-03-07 ENCOUNTER — Encounter: Payer: Medicare Other | Admitting: *Deleted

## 2020-03-07 DIAGNOSIS — M6281 Muscle weakness (generalized): Secondary | ICD-10-CM | POA: Diagnosis not present

## 2020-03-07 DIAGNOSIS — Z8781 Personal history of (healed) traumatic fracture: Secondary | ICD-10-CM | POA: Diagnosis not present

## 2020-03-07 DIAGNOSIS — F0391 Unspecified dementia with behavioral disturbance: Secondary | ICD-10-CM | POA: Diagnosis not present

## 2020-03-07 DIAGNOSIS — F339 Major depressive disorder, recurrent, unspecified: Secondary | ICD-10-CM | POA: Diagnosis not present

## 2020-03-07 DIAGNOSIS — E1159 Type 2 diabetes mellitus with other circulatory complications: Secondary | ICD-10-CM | POA: Diagnosis not present

## 2020-03-07 DIAGNOSIS — N401 Enlarged prostate with lower urinary tract symptoms: Secondary | ICD-10-CM | POA: Diagnosis not present

## 2020-03-07 DIAGNOSIS — E119 Type 2 diabetes mellitus without complications: Secondary | ICD-10-CM | POA: Diagnosis not present

## 2020-03-07 DIAGNOSIS — R140 Abdominal distension (gaseous): Secondary | ICD-10-CM | POA: Diagnosis not present

## 2020-03-07 DIAGNOSIS — Z79899 Other long term (current) drug therapy: Secondary | ICD-10-CM | POA: Diagnosis not present

## 2020-03-07 DIAGNOSIS — D6852 Prothrombin gene mutation: Secondary | ICD-10-CM | POA: Diagnosis not present

## 2020-03-07 DIAGNOSIS — Z741 Need for assistance with personal care: Secondary | ICD-10-CM | POA: Diagnosis not present

## 2020-03-07 DIAGNOSIS — F4323 Adjustment disorder with mixed anxiety and depressed mood: Secondary | ICD-10-CM | POA: Diagnosis not present

## 2020-03-07 DIAGNOSIS — Z87891 Personal history of nicotine dependence: Secondary | ICD-10-CM | POA: Diagnosis not present

## 2020-03-07 DIAGNOSIS — Z9181 History of falling: Secondary | ICD-10-CM | POA: Diagnosis not present

## 2020-03-07 DIAGNOSIS — R41841 Cognitive communication deficit: Secondary | ICD-10-CM | POA: Diagnosis not present

## 2020-03-07 DIAGNOSIS — Z7901 Long term (current) use of anticoagulants: Secondary | ICD-10-CM | POA: Diagnosis not present

## 2020-03-07 DIAGNOSIS — G4733 Obstructive sleep apnea (adult) (pediatric): Secondary | ICD-10-CM | POA: Diagnosis not present

## 2020-03-07 DIAGNOSIS — M48061 Spinal stenosis, lumbar region without neurogenic claudication: Secondary | ICD-10-CM | POA: Diagnosis not present

## 2020-03-07 DIAGNOSIS — G4700 Insomnia, unspecified: Secondary | ICD-10-CM | POA: Diagnosis not present

## 2020-03-07 DIAGNOSIS — S72142S Displaced intertrochanteric fracture of left femur, sequela: Secondary | ICD-10-CM | POA: Diagnosis not present

## 2020-03-07 DIAGNOSIS — Z86711 Personal history of pulmonary embolism: Secondary | ICD-10-CM | POA: Diagnosis not present

## 2020-03-07 DIAGNOSIS — Z8673 Personal history of transient ischemic attack (TIA), and cerebral infarction without residual deficits: Secondary | ICD-10-CM | POA: Diagnosis not present

## 2020-03-07 DIAGNOSIS — R262 Difficulty in walking, not elsewhere classified: Secondary | ICD-10-CM | POA: Diagnosis not present

## 2020-03-07 DIAGNOSIS — R451 Restlessness and agitation: Secondary | ICD-10-CM | POA: Diagnosis not present

## 2020-03-07 DIAGNOSIS — R410 Disorientation, unspecified: Secondary | ICD-10-CM | POA: Diagnosis not present

## 2020-03-07 DIAGNOSIS — I2692 Saddle embolus of pulmonary artery without acute cor pulmonale: Secondary | ICD-10-CM | POA: Diagnosis not present

## 2020-03-07 DIAGNOSIS — M159 Polyosteoarthritis, unspecified: Secondary | ICD-10-CM | POA: Diagnosis not present

## 2020-03-07 DIAGNOSIS — K5909 Other constipation: Secondary | ICD-10-CM | POA: Diagnosis not present

## 2020-03-07 DIAGNOSIS — I825Y9 Chronic embolism and thrombosis of unspecified deep veins of unspecified proximal lower extremity: Secondary | ICD-10-CM | POA: Diagnosis not present

## 2020-03-07 DIAGNOSIS — R251 Tremor, unspecified: Secondary | ICD-10-CM | POA: Diagnosis not present

## 2020-03-07 DIAGNOSIS — E1140 Type 2 diabetes mellitus with diabetic neuropathy, unspecified: Secondary | ICD-10-CM | POA: Diagnosis not present

## 2020-03-07 DIAGNOSIS — K5900 Constipation, unspecified: Secondary | ICD-10-CM | POA: Diagnosis not present

## 2020-03-07 DIAGNOSIS — I10 Essential (primary) hypertension: Secondary | ICD-10-CM | POA: Diagnosis not present

## 2020-03-07 DIAGNOSIS — E1165 Type 2 diabetes mellitus with hyperglycemia: Secondary | ICD-10-CM | POA: Diagnosis not present

## 2020-03-07 DIAGNOSIS — D62 Acute posthemorrhagic anemia: Secondary | ICD-10-CM | POA: Diagnosis not present

## 2020-03-07 DIAGNOSIS — Z4789 Encounter for other orthopedic aftercare: Secondary | ICD-10-CM | POA: Diagnosis not present

## 2020-03-07 DIAGNOSIS — Z794 Long term (current) use of insulin: Secondary | ICD-10-CM | POA: Diagnosis not present

## 2020-03-07 DIAGNOSIS — G3184 Mild cognitive impairment, so stated: Secondary | ICD-10-CM | POA: Diagnosis not present

## 2020-03-07 DIAGNOSIS — R1110 Vomiting, unspecified: Secondary | ICD-10-CM | POA: Diagnosis not present

## 2020-03-07 DIAGNOSIS — E785 Hyperlipidemia, unspecified: Secondary | ICD-10-CM | POA: Diagnosis not present

## 2020-03-07 DIAGNOSIS — M545 Low back pain: Secondary | ICD-10-CM | POA: Diagnosis not present

## 2020-03-07 DIAGNOSIS — Z86718 Personal history of other venous thrombosis and embolism: Secondary | ICD-10-CM | POA: Diagnosis not present

## 2020-03-07 DIAGNOSIS — K4090 Unilateral inguinal hernia, without obstruction or gangrene, not specified as recurrent: Secondary | ICD-10-CM | POA: Diagnosis not present

## 2020-03-07 DIAGNOSIS — F0280 Dementia in other diseases classified elsewhere without behavioral disturbance: Secondary | ICD-10-CM | POA: Diagnosis not present

## 2020-03-07 DIAGNOSIS — S72142D Displaced intertrochanteric fracture of left femur, subsequent encounter for closed fracture with routine healing: Secondary | ICD-10-CM | POA: Diagnosis not present

## 2020-03-07 DIAGNOSIS — E0841 Diabetes mellitus due to underlying condition with diabetic mononeuropathy: Secondary | ICD-10-CM | POA: Diagnosis not present

## 2020-03-07 DIAGNOSIS — F0390 Unspecified dementia without behavioral disturbance: Secondary | ICD-10-CM | POA: Diagnosis not present

## 2020-03-07 DIAGNOSIS — E1169 Type 2 diabetes mellitus with other specified complication: Secondary | ICD-10-CM | POA: Diagnosis not present

## 2020-03-07 DIAGNOSIS — F329 Major depressive disorder, single episode, unspecified: Secondary | ICD-10-CM | POA: Diagnosis not present

## 2020-03-07 LAB — TYPE AND SCREEN
ABO/RH(D): B NEG
Antibody Screen: NEGATIVE
Unit division: 0
Unit division: 0
Unit division: 0

## 2020-03-07 LAB — CBC
HCT: 30 % — ABNORMAL LOW (ref 39.0–52.0)
Hemoglobin: 9.5 g/dL — ABNORMAL LOW (ref 13.0–17.0)
MCH: 30.2 pg (ref 26.0–34.0)
MCHC: 31.7 g/dL (ref 30.0–36.0)
MCV: 95.2 fL (ref 80.0–100.0)
Platelets: 233 10*3/uL (ref 150–400)
RBC: 3.15 MIL/uL — ABNORMAL LOW (ref 4.22–5.81)
RDW: 12.2 % (ref 11.5–15.5)
WBC: 10.1 10*3/uL (ref 4.0–10.5)
nRBC: 0 % (ref 0.0–0.2)

## 2020-03-07 LAB — BASIC METABOLIC PANEL
Anion gap: 7 (ref 5–15)
BUN: 10 mg/dL (ref 8–23)
CO2: 27 mmol/L (ref 22–32)
Calcium: 8.2 mg/dL — ABNORMAL LOW (ref 8.9–10.3)
Chloride: 102 mmol/L (ref 98–111)
Creatinine, Ser: 0.66 mg/dL (ref 0.61–1.24)
GFR calc Af Amer: >60 mL/min (ref >60)
GFR calc non Af Amer: >60 mL/min (ref >60)
Glucose, Bld: 198 mg/dL — ABNORMAL HIGH (ref 70–99)
Potassium: 3.8 mmol/L (ref 3.5–5.1)
Sodium: 136 mmol/L (ref 135–145)

## 2020-03-07 LAB — BPAM RBC
Blood Product Expiration Date: 202104162359
Blood Product Expiration Date: 202105032359
Blood Product Expiration Date: 202105042359
ISSUE DATE / TIME: 202104121414
Unit Type and Rh: 1700
Unit Type and Rh: 1700
Unit Type and Rh: 9500

## 2020-03-07 LAB — RESPIRATORY PANEL BY RT PCR (FLU A&B, COVID)
Influenza A by PCR: NEGATIVE
Influenza B by PCR: NEGATIVE
SARS Coronavirus 2 by RT PCR: NEGATIVE

## 2020-03-07 LAB — GLUCOSE, CAPILLARY
Glucose-Capillary: 145 mg/dL — ABNORMAL HIGH (ref 70–99)
Glucose-Capillary: 219 mg/dL — ABNORMAL HIGH (ref 70–99)

## 2020-03-07 MED ORDER — POLYETHYLENE GLYCOL 3350 17 G PO PACK: 17.0000 g | PACK | Freq: Every day | ORAL | 0 refills | Status: DC | PRN

## 2020-03-07 MED ORDER — DOCUSATE SODIUM 100 MG PO CAPS: 100.0000 mg | ORAL_CAPSULE | Freq: Two times a day (BID) | ORAL | 0 refills | Status: DC

## 2020-03-07 MED ORDER — HYDROCODONE-ACETAMINOPHEN 5-325 MG PO TABS: 1.0000 | ORAL_TABLET | ORAL | 0 refills | Status: DC | PRN

## 2020-03-07 NOTE — Plan of Care (Signed)
  Problem: Acute Rehab OT Goals (only OT should resolve) Goal: Pt. Will Perform Eating Flowsheets (Taken 03/07/2020 0934) Pt Will Perform Eating:  with modified independence  sitting Goal: Pt. Will Perform Grooming Flowsheets (Taken 03/07/2020 0934) Pt Will Perform Grooming:  with set-up  sitting Goal: Pt. Will Perform Upper Body Dressing Flowsheets (Taken 03/07/2020 0934) Pt Will Perform Upper Body Dressing:  with min assist  sitting Goal: Pt. Will Perform Lower Body Dressing Flowsheets (Taken 03/07/2020 0934) Pt Will Perform Lower Body Dressing:  with mod assist  sitting/lateral leans  sit to/from stand Goal: Pt. Will Transfer To Toilet Flowsheets (Taken 03/07/2020 0934) Pt Will Transfer to Toilet:  with min assist  stand pivot transfer  ambulating  bedside commode  regular height toilet Goal: Pt. Will Perform Toileting-Clothing Manipulation Flowsheets (Taken 03/07/2020 0934) Pt Will Perform Toileting - Clothing Manipulation and hygiene:  with min guard assist  sitting/lateral leans  sit to/from stand Goal: Pt/Caregiver Will Perform Home Exercise Program Flowsheets (Taken 03/07/2020 0934) Pt/caregiver will Perform Home Exercise Program:  Increased strength  Both right and left upper extremity  With Supervision  With written HEP provided

## 2020-03-07 NOTE — NC FL2 (Signed)
[place] MEDICAID FL2 LEVEL OF CARE SCREENING TOOL     IDENTIFICATION  Patient Name: Christian [name] Birthdate: 09/24/1941 Sex: male Admission Date (Current Location): 03/03/2020  [facility] and [place] Number:  [employer] and Address:  [facility] [address] [place]      Provider Number: [id number]  Attending Physician Name and Address:  Rodena Goldmann, DO  Relative Name and Phone Number:       Current Level of Care: Hospital Recommended Level of Care: [place] Prior Approval Number:    Date Approved/Denied:   PASRR Number:    Discharge Plan: SNF    Current Diagnoses: Patient Active Problem List   Diagnosis Date Noted  . Displaced intertrochanteric fracture of left femur, initial encounter for closed fracture ([place])   . S/p left hip fracture 03/03/2020  . Benign prostatic hyperplasia with lower urinary tract symptoms 06/04/2019  . Urge incontinence 06/04/2019  . History of stroke 06/02/2019  . Medicare annual wellness visit, subsequent 06/02/2019  . Urinary incontinence 10/28/2018  . Polyuria 10/28/2018  . Dermatitis 10/28/2018  . Tinea cruris 03/25/2018  . Tick bite 03/25/2018  . Balanitis 03/25/2018  . At moderate risk for fall 12/31/2017  . Alcohol abuse 12/29/2017  . History of pulmonary embolism 12/29/2017  . Depression, recurrent ([place]) 12/29/2017  . Alcohol intoxication ([place]) 12/16/2017  . Wears hearing aid 06/09/2017  . Tinnitus of both ears 06/09/2017  . Bilateral impacted cerumen 06/09/2017  . Left wrist pain 06/26/2016  . Morning joint stiffness 06/26/2016  . Polyarthralgia 06/26/2016  . Gait disturbance 04/02/2016  . Physical deconditioning 04/02/2016  . Pain of both shoulder joints 04/02/2016  . Leg weakness, bilateral 04/02/2016  . Encounter for therapeutic drug monitoring 11/29/2015  . Encounter for health maintenance examination in adult 10/30/2015  . Shoulder pain, bilateral  10/30/2015  . Muscle weakness 10/30/2015  . Generalized weakness 10/30/2015  . Rash 10/30/2015  . PVC (premature ventricular contraction) 10/10/2015  . Decreased energy 06/20/2015  . DVT (deep venous thrombosis) ([place]) 06/20/2015  . Hammer toe of left foot 06/20/2015  . Pre-ulcerative calluses 06/20/2015  . Hypertrophic toenail 06/20/2015  . Diabetic mononeuropathy associated with diabetes mellitus due to underlying condition ([place]) 06/20/2015  . Leg pain, bilateral 06/20/2015  . Chronic low back pain 06/20/2015  . 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 06/14/2015  . OSA on CPAP 03/06/2015  . Edema 03/06/2015  . Leg pain, diffuse 03/06/2015  . Bilateral low back pain without sciatica 03/06/2015  . Tremor 03/06/2015  . Insomnia 03/06/2015  . Vaccine counseling 03/06/2015  . Chronic back pain 03/06/2015  . Wears glasses 03/06/2015  . Hearing loss 03/06/2015  . Spinal stenosis of lumbar region 03/06/2015  . Ptosis 03/06/2015  . Positive depression screening 03/06/2015  . Advance directive discussed with patient 03/06/2015  . Bradycardia 02/27/2015  . Current use of long term anticoagulation   . Dementia without behavioral disturbance ([place])   . Essential hypertension 02/08/2015  . Hyperlipidemia 02/08/2015  . OSA (obstructive sleep apnea) 02/08/2015  . Memory loss 03/12/2013  . Diabetes mellitus ([place]) 03/12/2013    Orientation RESPIRATION BLADDER Height & Weight     Self, Time, Situation, Place  O2(see DC summary) Continent Weight: 107.5 kg Height:  5\' 11"  (180.3 cm)  BEHAVIORAL SYMPTOMS/MOOD NEUROLOGICAL BOWEL NUTRITION STATUS      Continent Diet(see DC summary)  AMBULATORY STATUS COMMUNICATION OF NEEDS Skin  Extensive Assist Verbally Normal                       Personal Care Assistance Level of Assistance  Bathing, Dressing, Feeding Bathing Assistance: Maximum assistance Feeding assistance: Limited  assistance Dressing Assistance: Maximum assistance     Functional Limitations Info  Sight, Hearing, Speech Sight Info: Adequate Hearing Info: Adequate Speech Info: Adequate    SPECIAL CARE FACTORS FREQUENCY  PT (By licensed PT), OT (By licensed OT)     PT Frequency: 5x/week OT Frequency: 3x/week            Contractures Contractures Info: Not present    Additional Factors Info  Code Status, Allergies Code Status Info: DNR Allergies Info: BEE VENOM, ULTRAM, OXYCODONE, OXYCONTIN           Current Medications (03/07/2020):  This is the current hospital active medication list Current Facility-Administered Medications  Medication Dose Route Frequency Provider Last Rate Last Admin  . 0.9 %  sodium chloride infusion   Intravenous Continuous Carole Civil, MD 20 mL/hr at 03/07/20 0520 Rate Verify at 03/07/20 0520  . bisacodyl (DULCOLAX) EC tablet 5 mg  5 mg Oral Daily PRN Carole Civil, MD      . diltiazem (CARDIZEM) tablet 30 mg  30 mg Oral BID Carole Civil, MD   30 mg at 03/07/20 1002  . docusate sodium (COLACE) capsule 100 mg  100 mg Oral BID Carole Civil, MD   100 mg at 03/07/20 1001  . donepezil (ARICEPT) tablet 10 mg  10 mg Oral QHS Carole Civil, MD   10 mg at 03/06/20 2141  . fesoterodine (TOVIAZ) tablet 8 mg  8 mg Oral Daily Carole Civil, MD   8 mg at 03/07/20 1002  . flecainide (TAMBOCOR) tablet 75 mg  75 mg Oral Q12H Carole Civil, MD   75 mg at 03/07/20 1002  . HYDROcodone-acetaminophen (NORCO/VICODIN) 5-325 MG per tablet 1-2 tablet  1-2 tablet Oral Q4H PRN Carole Civil, MD   1 tablet at 03/06/20 2140  . insulin aspart (novoLOG) injection 0-20 Units  0-20 Units Subcutaneous TID WC Carole Civil, MD   7 Units at 03/07/20 1209  . insulin aspart (novoLOG) injection 0-5 Units  0-5 Units Subcutaneous QHS Carole Civil, MD   3 Units at 03/06/20 2141  . insulin detemir (LEVEMIR) injection 65 Units  65 Units  Subcutaneous QHS Carole Civil, MD   65 Units at 03/06/20 2141  . memantine (NAMENDA XR) 24 hr capsule 28 mg  28 mg Oral Daily Carole Civil, MD   28 mg at 03/07/20 1002  . menthol-cetylpyridinium (CEPACOL) lozenge 3 mg  1 lozenge Oral PRN Carole Civil, MD       Or  . phenol (CHLORASEPTIC) mouth spray 1 spray  1 spray Mouth/Throat PRN Carole Civil, MD      . metoCLOPramide (REGLAN) tablet 5-10 mg  5-10 mg Oral Q8H PRN Carole Civil, MD       Or  . metoCLOPramide (REGLAN) injection 5-10 mg  5-10 mg Intravenous Q8H PRN Carole Civil, MD      . ondansetron [facility]) tablet 4 mg  4 mg Oral Q6H PRN Carole Civil, MD       Or  . ondansetron [facility]) injection 4 mg  4 mg Intravenous Q6H PRN Carole Civil, MD      . polyethylene glycol (MIRALAX / GLYCOLAX) packet  17 g  17 g Oral Daily PRN Carole Civil, MD      . rivaroxaban [facility]) tablet 20 mg  20 mg Oral q1800 Carole Civil, MD   20 mg at 03/06/20 1749  . rosuvastatin (CRESTOR) tablet 10 mg  10 mg Oral Daily Carole Civil, MD   10 mg at 03/07/20 1001  . senna-docusate (Senokot-S) tablet 1 tablet  1 tablet Oral QHS PRN Carole Civil, MD      . sodium phosphate (FLEET) 7-19 GM/118ML enema 1 enema  1 enema Rectal Once PRN Carole Civil, MD      . tamsulosin [facility]) capsule 0.4 mg  0.4 mg Oral Daily Carole Civil, MD   0.4 mg at 03/07/20 1001     Discharge Medications: Please see discharge summary for a list of discharge medications.  Relevant Imaging Results:  Relevant Lab Results:   Additional Information [phone]  Afsheen Antony, Chauncey [place], RN

## 2020-03-07 NOTE — Discharge Summary (Signed)
Physician Discharge Summary  Christian [name] [id number] DOB: 05-21-1941 DOA: 03/03/2020  PCP: Carlena Hurl, PA-C  Admit date: 03/03/2020  Discharge date: 03/07/2020  Admitted From:Home  Disposition:  SNF  Recommendations for Outpatient Follow-up:  1. Follow up with PCP in 1-2 weeks and follow-up CBC in 1 week 2. Follow-up with orthopedics Dr. Aline Brochure in 4 weeks 3. Okay to remove staples by 03/19/2020 4. Continue on Xarelto as prior 5. Norco 5/325 mg prescribed for 10 tablets for pain control  Home Health: None  Equipment/Devices: None  Discharge Condition: Stable  CODE STATUS: DNR  Diet recommendation: Heart Healthy/carb modified  Brief/Interim Summary: As per H&P written by Dr. Nehemiah Settle on 03/03/2020 79 y.o.malewith a history of PE and DVT secondary to prothrombin gene, currently on anticoagulation, type 2 diabetes with poor control, hypertension, dementia, depression. Patient seen for fall that occurred earlier today. He was attempting to ambulate and fell when he tried to get out of his chair. He landed on his left hip and had immediate pain that is nonradiating. Pain worse with movement and improved with rest. EMS was called and the patient was taken to the hospital for evaluation. In conversing with wife, the patient has "given up on life". He has had limited mobility, eats what he wants, does not take check his blood sugars. Patient mostly sits in chair or is in bed. He has had severe deconditioning has been ongoing for the past few months.  4/13: Patient has had left hip ORIF on 03/05/2020 by Dr. Aline Brochure.  He has been resumed on his home Xarelto with no overt bleeding concerns noted.  His hemoglobin levels are slightly downward trending, but vital signs are stable.  PT is recommending SNF and patient and family have elected for [facility].  Please see recommendations as noted above regarding staple removal and follow-up with orthopedics in 4 weeks.  No  other acute events noted throughout the course of this admission.  He is stable for discharge.  Discharge Diagnoses:  Principal Problem:   S/p left hip fracture Active Problems:   Diabetes mellitus ([place])   Essential hypertension   Dementia without behavioral disturbance ([place])   Prothrombin [id number] mutation, heterozygous, 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   Physical deconditioning   Alcohol abuse   History of pulmonary embolism   Depression, recurrent (H[facility])   History of stroke   Benign prostatic hyperplasia with lower urinary tract symptoms   Displaced intertrochanteric fracture of left femur, initial encounter for closed fracture [facility])  Principal discharge diagnosis: Left hip fracture status post repair 03/05/2020.  Discharge Instructions  Discharge Instructions    Diet - low sodium heart healthy   Complete by: As directed    Increase activity slowly   Complete by: As directed      Allergies as of 03/07/2020      Reactions   Bee Venom Swelling   Ultram [tramadol Hcl]    Makes him wired, gives insomnia   Oxycodone Other (See Comments)   Mental status changes   Oxycontin [oxycodone Hcl] Other (See Comments)   Mental status changes per pt      Medication List    TAKE these medications   diltiazem 30 MG tablet Commonly known as: CARDIZEM TAKE 1 TABLET(30 MG) BY MOUTH TWICE DAILY What changed: See the new instructions.   docusate sodium 100 MG capsule Commonly known as: COLACE Take 1 capsule (100 mg total) by mouth 2 (two) times daily.   donepezil  10 MG tablet Commonly known as: ARICEPT Take 1 tablet (10 mg) by mouth at bedtime.   flecainide 150 MG tablet Commonly known as: TAMBOCOR TAKE 1/2 TABLET(75 MG) BY MOUTH TWICE DAILY What changed:   how much to take  how to take this  when to take this   folic acid 1 MG tablet Commonly known as: FOLVITE TAKE 1 TABLET(1 MG) BY MOUTH DAILY What changed: See the new instructions.   furosemide 40 MG  tablet Commonly known as: LASIX Take 40 mg daily AS NEEDED  For swelling. What changed:   how much to take  how to take this  when to take this  reasons to take this  additional instructions   HYDROcodone-acetaminophen 5-325 MG tablet Commonly known as: NORCO/VICODIN Take 1-2 tablets by mouth every 4 (four) hours as needed for moderate pain.   memantine 28 MG Cp24 24 hr capsule Commonly known as: NAMENDA XR TAKE 1 CAPSULE(28 MG) BY MOUTH DAILY What changed:   how much to take  how to take this  when to take this  additional instructions   metFORMIN 850 MG tablet Commonly known as: GLUCOPHAGE TAKE 1 TABLET(850 MG) BY MOUTH TWICE DAILY WITH A MEAL What changed: See the new instructions.   naproxen sodium 220 MG tablet Commonly known as: ALEVE Take 220 mg by mouth daily.   OSTEO BI-FLEX ADV JOINT SHIELD PO Take 2 tablets by mouth daily.   polyethylene glycol 17 g packet Commonly known as: MIRALAX / GLYCOLAX Take 17 g by mouth daily as needed for mild constipation.   rosuvastatin 10 MG tablet Commonly known as: CRESTOR TAKE 1 TABLET(10 MG) BY MOUTH AT BEDTIME What changed: See the new instructions.   tamsulosin 0.4 MG Caps capsule Commonly known as: FLOMAX TAKE 1 CAPSULE(0.4 MG) BY MOUTH DAILY What changed: See the new instructions.   tolterodine 4 MG 24 hr capsule Commonly known as: DETROL LA Take 4 mg by mouth 2 (two) times daily.   Tyler Aas FlexTouch 100 UNIT/ML FlexTouch Pen Generic drug: insulin degludec ADMINISTER 41 UNITS UNDER THE SKIN DAILY AT 10 PM What changed: See the new instructions.   Xarelto 20 MG Tabs tablet Generic drug: rivaroxaban TAKE 1 TABLET BY MOUTH EVERY DAY WITH SUPPER What changed: See the new instructions.       Contact information for follow-up providers    Tysinger, Camelia Eng, PA-C Follow up in 1 week(s).   Specialty: Family Medicine Contact information: [place] [address] [phone]         Carole Civil, MD Follow up in 4 week(s).   Specialties: Orthopedic Surgery, Radiology Contact information: [address] [place] [address] [phone]            Contact information for after-discharge care    [facility] .   Service: Skilled Nursing Contact information: [address] [place] [address] [phone]                 Allergies  Allergen Reactions  . Bee Venom Swelling  . Ultram [Tramadol Hcl]     Makes him wired, gives insomnia  . Oxycodone Other (See Comments)    Mental status changes  . Oxycontin [Oxycodone Hcl] Other (See Comments)    Mental status changes per pt    Consultations:  Orthopedics-Dr. Aline Brochure   Procedures/Studies: DG Chest 1 View  Result Date: 03/03/2020 CLINICAL DATA:  Status post fall. EXAM:  CHEST  1 VIEW COMPARISON:  May 11, 2019 FINDINGS: Very mild, chronic appearing increased lung markings are seen without evidence of acute infiltrate, pleural effusion or pneumothorax. The heart size and mediastinal contours are within normal limits. The visualized skeletal structures are unremarkable. IMPRESSION: No active disease. Electronically Signed   By: Virgina Norfolk M.D.   On: 03/03/2020 19:34   CT Head Wo Contrast  Result Date: 03/03/2020 CLINICAL DATA:  Fall, hit head EXAM: CT HEAD WITHOUT CONTRAST TECHNIQUE: Contiguous axial images were obtained from the base of the skull through the vertex without intravenous contrast. COMPARISON:  12/16/2017 FINDINGS: Brain: Age related volume loss. No acute intracranial abnormality. Specifically, no hemorrhage, hydrocephalus, mass lesion, acute infarction, or significant intracranial injury. Vascular: No hyperdense vessel or unexpected calcification. Skull: No acute calvarial abnormality. Sinuses/Orbits: Visualized paranasal sinuses and mastoids clear. Orbital soft tissues unremarkable. Other: None IMPRESSION: No  acute intracranial abnormality. Electronically Signed   By: Rolm Baptise M.D.   On: 03/03/2020 19:05   CT Cervical Spine Wo Contrast  Result Date: 03/03/2020 CLINICAL DATA:  Fall, hit head EXAM: CT CERVICAL SPINE WITHOUT CONTRAST TECHNIQUE: Multidetector CT imaging of the cervical spine was performed without intravenous contrast. Multiplanar CT image reconstructions were also generated. COMPARISON:  None. FINDINGS: Alignment: Normal Skull base and vertebrae: No acute fracture. No primary bone lesion or focal pathologic process. Soft tissues and spinal canal: No prevertebral fluid or swelling. No visible canal hematoma. Disc levels:  Advanced diffuse degenerative disc and facet disease. Upper chest: No acute findings Other: None IMPRESSION: Advanced diffuse degenerative disc and facet disease. No acute bony abnormality. Electronically Signed   By: Rolm Baptise M.D.   On: 03/03/2020 19:04   DG Pelvis Portable  Result Date: 03/05/2020 CLINICAL DATA:  Left hip fracture repair EXAM: PORTABLE PELVIS 1-2 VIEWS COMPARISON:  None. FINDINGS: The patient is status post ORIF of a left hip fracture. A gamma nail and intramedullary femoral rod are identified with a distal interlocking screw. IMPRESSION: Left hip fracture repair as above. Electronically Signed   By: Dorise Bullion III M.D   On: 03/05/2020 12:32   DG HIP OPERATIVE UNILAT WITH PELVIS LEFT  Result Date: 03/05/2020 CLINICAL DATA:  Left hip fracture repair. FLUOROSCOPY TIME:  1 minutes 29 seconds EXAM: OPERATIVE LEFT HIP (WITH PELVIS IF PERFORMED) 11 VIEWS TECHNIQUE: Fluoroscopic spot image(s) were submitted for interpretation post-operatively. COMPARISON:  None. FINDINGS: A gamma nail and femoral rod are placed across the known left hip fracture throughout the study. IMPRESSION: Left hip fracture repair as above. Electronically Signed   By: Dorise Bullion III M.D   On: 03/05/2020 12:34   DG HIP Kaylyn Layer W OR W/O PELVIS 2-3 VIEWS LEFT  Result Date:  03/03/2020 CLINICAL DATA:  79 year old male with fall and left hip pain. EXAM: DG HIP (WITH OR WITHOUT PELVIS) 2-3V LEFT COMPARISON:  Pelvic radiograph dated 11/02/2015 FINDINGS: There is a comminuted, displaced and angulated intertrochanteric fracture of the left femoral neck. There is mild elevation of the femoral shaft and impaction. There is no dislocation. The bones are osteopenic. The soft tissues are unremarkable. IMPRESSION: Comminuted, displaced and angulated intertrochanteric fracture of the left femoral neck. Electronically Signed   By: Anner Crete M.D.   On: 03/03/2020 19:35     Discharge Exam: Vitals:   03/06/20 2128 03/07/20 0519  BP: (!) 157/61 133/62  Pulse: (!) 106 78  Resp: 18 20  Temp: 98.2 F (36.8 C) 98.6 F (37 C)  SpO2: 95%  98%   Vitals:   03/06/20 1050 03/06/20 1430 03/06/20 2128 03/07/20 0519  BP: (!) 142/60 128/63 (!) 157/61 133/62  Pulse:  72 (!) 106 78  Resp:  20 18 20   Temp:  99 F (37.2 C) 98.2 F (36.8 C) 98.6 F (37 C)  TempSrc:  Oral Oral   SpO2:  97% 95% 98%  Weight:      Height:        General: Pt is alert, awake, not in acute distress Cardiovascular: RRR, S1/S2 +, no rubs, no gallops Respiratory: CTA bilaterally, no wheezing, no rhonchi, currently on 2 L nasal cannula oxygen Abdominal: Soft, NT, ND, bowel sounds + Extremities: no edema, no cyanosis    The results of significant diagnostics from this hospitalization (including imaging, microbiology, ancillary and laboratory) are listed below for reference.     Microbiology: Recent Results (from the past 240 hour(s))  SARS CORONAVIRUS 2 (TAT 6-24 HRS) Nasopharyngeal Nasopharyngeal Swab     Status: None   Collection Time: 03/03/20  6:28 PM   Specimen: Nasopharyngeal Swab  Result Value Ref Range Status   SARS Coronavirus 2 NEGATIVE NEGATIVE Final    Comment: (NOTE) SARS-CoV-2 target nucleic acids are NOT DETECTED. The SARS-CoV-2 RNA is generally detectable in upper and  lower respiratory specimens during the acute phase of infection. Negative results do not preclude SARS-CoV-2 infection, do not rule out co-infections with other pathogens, and should not be used as the sole basis for treatment or other patient management decisions. Negative results must be combined with clinical observations, patient history, and epidemiological information. The expected result is Negative. Fact Sheet for Patients: SugarRoll.be Fact Sheet for Healthcare Providers: https://www.woods-mathews.com/ This test is not yet approved or cleared by the [place] FDA and  has been authorized for detection and/or diagnosis of SARS-CoV-2 by FDA under an Emergency Use Authorization (EUA). This EUA will remain  in effect (meaning this test can be used) for the duration of the COVID-19 declaration under Section 56 4(b)(1) of the Act, 21 U.S.C. section 360bbb-3(b)(1), unless the authorization is terminated or revoked sooner. Performed at [facility], [place] [address], [place], [place] [address]   MRSA PCR Screening     Status: None   Collection Time: 03/04/20 11:31 AM   Specimen: Nasal Mucosa; Nasopharyngeal  Result Value Ref Range Status   MRSA by PCR NEGATIVE NEGATIVE Final    Comment:        The GeneXpert MRSA Assay (FDA approved for NASAL specimens only), is one component of a comprehensive MRSA colonization surveillance program. It is not intended to diagnose MRSA infection nor to guide or monitor treatment for MRSA infections. Performed at [facility], [address] [place], [place] [address]      Labs: BNP (last 3 results) No results for input(s): BNP in the last 8760 hours. Basic Metabolic Panel: Recent Labs  Lab 03/03/20 1811 03/04/20 0649 03/06/20 0448 03/07/20 0441  NA 136 137 137 136  K 4.4 4.3 4.0 3.8  CL 104 102 103 102  CO2 23 23 26 27   GLUCOSE 321* 296* 202* 198*  BUN 17 17 12 10   CREATININE  0.88 0.75 0.67 0.66  CALCIUM 8.7* 8.8* 8.2* 8.2*   Liver Function Tests: Recent Labs  Lab 03/03/20 1811  AST 17  ALT 15  ALKPHOS 86  BILITOT 0.5  PROT 6.7  ALBUMIN 3.5   No results for input(s): LIPASE, AMYLASE in the last 168 hours. No results for input(s): AMMONIA  in the last 168 hours. CBC: Recent Labs  Lab 03/03/20 1811 03/04/20 0649 03/05/20 1616 03/06/20 0448 03/07/20 0441  WBC 9.7 11.6*  --  9.8 10.1  HGB 14.1 12.0* 10.5* 10.0* 9.5*  HCT 44.8 38.3* 32.9* 32.0* 30.0*  MCV 94.5 94.8  --  95.2 95.2  PLT 227 249  --  219 233   Cardiac Enzymes: No results for input(s): CKTOTAL, CKMB, CKMBINDEX, TROPONINI in the last 168 hours. BNP: Invalid input(s): POCBNP CBG: Recent Labs  Lab 03/06/20 0722 03/06/20 1116 03/06/20 1704 03/06/20 2125 03/07/20 0814  GLUCAP 186* 237* 284* 268* 145*   D-Dimer No results for input(s): DDIMER in the last 72 hours. Hgb A1c No results for input(s): HGBA1C in the last 72 hours. Lipid Profile No results for input(s): CHOL, HDL, LDLCALC, TRIG, CHOLHDL, LDLDIRECT in the last 72 hours. Thyroid function studies No results for input(s): TSH, T4TOTAL, T3FREE, THYROIDAB in the last 72 hours.  Invalid input(s): FREET3 Anemia work up No results for input(s): VITAMINB12, FOLATE, FERRITIN, TIBC, IRON, RETICCTPCT in the last 72 hours. Urinalysis    Component Value Date/Time   COLORURINE YELLOW 05/12/2019 0008   APPEARANCEUR CLEAR 05/12/2019 0008   LABSPEC 1.024 05/12/2019 0008   LABSPEC 1.015 10/28/2018 1522   PHURINE 5.0 05/12/2019 0008   GLUCOSEU >=500 (A) 05/12/2019 0008   HGBUR NEGATIVE 05/12/2019 0008   BILIRUBINUR NEGATIVE 05/12/2019 0008   BILIRUBINUR negative 10/28/2018 1522   BILIRUBINUR n 05/10/2016 1500   KETONESUR 5 (A) 05/12/2019 0008   PROTEINUR NEGATIVE 05/12/2019 0008   UROBILINOGEN negative 05/10/2016 1500   UROBILINOGEN 0.2 01/31/2015 1505   NITRITE NEGATIVE 05/12/2019 0008   LEUKOCYTESUR NEGATIVE 05/12/2019  0008   Sepsis Labs Invalid input(s): PROCALCITONIN,  WBC,  LACTICIDVEN Microbiology Recent Results (from the past 240 hour(s))  SARS CORONAVIRUS 2 (TAT 6-24 HRS) Nasopharyngeal Nasopharyngeal Swab     Status: None   Collection Time: 03/03/20  6:28 PM   Specimen: Nasopharyngeal Swab  Result Value Ref Range Status   SARS Coronavirus 2 NEGATIVE NEGATIVE Final    Comment: (NOTE) SARS-CoV-2 target nucleic acids are NOT DETECTED. The SARS-CoV-2 RNA is generally detectable in upper and lower respiratory specimens during the acute phase of infection. Negative results do not preclude SARS-CoV-2 infection, do not rule out co-infections with other pathogens, and should not be used as the sole basis for treatment or other patient management decisions. Negative results must be combined with clinical observations, patient history, and epidemiological information. The expected result is Negative. Fact Sheet for Patients: SugarRoll.be Fact Sheet for Healthcare Providers: https://www.woods-mathews.com/ This test is not yet approved or cleared by the [place] FDA and  has been authorized for detection and/or diagnosis of SARS-CoV-2 by FDA under an Emergency Use Authorization (EUA). This EUA will remain  in effect (meaning this test can be used) for the duration of the COVID-19 declaration under Section 56 4(b)(1) of the Act, 21 U.S.C. section 360bbb-3(b)(1), unless the authorization is terminated or revoked sooner. Performed at [facility], [place] [address], [place], [place] [address]   MRSA PCR Screening     Status: None   Collection Time: 03/04/20 11:31 AM   Specimen: Nasal Mucosa; Nasopharyngeal  Result Value Ref Range Status   MRSA by PCR NEGATIVE NEGATIVE Final    Comment:        The GeneXpert MRSA Assay (FDA approved for NASAL specimens only), is one component of a comprehensive MRSA colonization surveillance program. It is  not intended  to diagnose MRSA infection nor to guide or monitor treatment for MRSA infections. Performed at [facility], [address], [place], [place] [address]      Time coordinating discharge: 35 minutes  SIGNED:   Rodena Goldmann, DO [facility] 03/07/2020, 10:31 AM  If 7PM-7AM, please contact night-coverage [email or web address]

## 2020-03-07 NOTE — Progress Notes (Signed)
Physical Therapy Treatment Patient Details Name: Christian [name] MRN: [id number] DOB: 11/26/1940 Today's Date: 03/07/2020    History of Present Illness Christian [name] is a 79 y.o. male s/p INTRAMEDULLARY (IM) NAIL INTERTROCHANTRIC (Left) on 03/05/20 with a history of PE and DVT secondary to prothrombin gene, currently on anticoagulation, type 2 diabetes with poor control, hypertension, dementia, depression.  Patient seen for fall that occurred earlier today.  He was attempting to ambulate and fell when he tried to get out of his chair.  He landed on his left hip and had immediate pain that is nonradiating.  Pain worse with movement and improved with rest.  EMS was called and the patient was taken to the hospital for evaluation.  In conversing with wife, the patient has "given up on life".  He has had limited mobility, eats what he wants, does not take check his blood sugars.  Patient mostly sits in chair or is in bed.  He has had severe deconditioning has been ongoing for the past few months.    PT Comments    Patient presents alert and able to follow most directions and answer questions appropriately with increased time and repeated verbal/tactile cueing.  Patient has difficulty maintaining sitting balance with frequent leaning to the right to off load left hip due to increasing pain, demonstrated increased BLE strength for completing sit to stand, but unable to take steps or transfer to chair due to weakness and fatigue.  Patient had shaking of UE's once fatigued and c/o nausea and put back to bed with 2 person Max assist - RN notified.  Patient will benefit from continued physical therapy in hospital and recommended venue below to increase strength, balance, endurance for safe ADLs and gait.   Follow Up Recommendations  SNF     Equipment Recommendations  None recommended by PT    Recommendations for Other Services       Precautions / Restrictions Precautions Precautions:  Fall Restrictions Weight Bearing Restrictions: Yes LLE Weight Bearing: Weight bearing as tolerated    Mobility  Bed Mobility Overal bed mobility: Needs Assistance Bed Mobility: Supine to Sit;Sit to Supine     Supine to sit: +2 for physical assistance Sit to supine: Max assist;+2 for physical assistance   General bed mobility comments: slow labored movement with frequent leaning to the right due to left hip pain  Transfers Overall transfer level: Needs assistance Equipment used: Rolling walker (2 wheeled) Transfers: Sit to/from Stand Sit to Stand: Max assist         General transfer comment: requires repeated verbal/tactile cueing for proper hand placement on RW with fair/poor carryover  Ambulation/Gait                 Stairs             Wheelchair Mobility    Modified Rankin (Stroke Patients Only)       Balance Overall balance assessment: Needs assistance Sitting-balance support: Feet supported;No upper extremity supported Sitting balance-Leahy Scale: Poor Sitting balance - Comments: frequent leaning to the right due to left hip pain Postural control: Right lateral lean Standing balance support: During functional activity;Bilateral upper extremity supported Standing balance-Leahy Scale: Poor Standing balance comment: using RW                            Cognition Arousal/Alertness: Awake/alert Behavior During Therapy: WFL for tasks assessed/performed Overall Cognitive Status: Within Functional Limits for tasks assessed  General Comments: Requires occasional repeated verbal/tactile cueing to complete functional tasks with fair carryover      Exercises      General Comments        Pertinent Vitals/Pain Pain Assessment: Faces Pain Score: 1  Faces Pain Scale: Hurts even more Pain Location: left hip Pain Descriptors / Indicators: Sore;Grimacing;Guarding Pain Intervention(s): Limited  activity within patient's tolerance;Monitored during session;Repositioned    Home Living Family/patient expects to be discharged to:: Private residence Living Arrangements: Spouse/significant other Available Help at Discharge: Family Type of Home: House Home Access: Stairs to enter Entrance Stairs-Rails: None Home Layout: Multi-level;Able to live on main level with bedroom/bathroom Home Equipment: Gilford Rile - 2 wheels;Cane - single point;Shower seat - built in;Bedside commode      Prior Function Level of Independence: Independent with assistive device(s);Needs assistance      Comments: household ambulator using RW, family assists with ADLs as needed   PT Goals (current goals can now be found in the care plan section) Acute Rehab PT Goals Patient Stated Goal: return home after rehab PT Goal Formulation: With patient Time For Goal Achievement: 03/20/20 Potential to Achieve Goals: Fair Progress towards PT goals: Progressing toward goals    Frequency    Min 4X/week      PT Plan      Co-evaluation PT/OT/SLP Co-Evaluation/Treatment: Yes Reason for Co-Treatment: Complexity of the patient's impairments (multi-system involvement);Necessary to address cognition/behavior during functional activity;To address functional/ADL transfers PT goals addressed during session: Mobility/safety with mobility;Balance;Proper use of DME;Strengthening/ROM OT goals addressed during session: ADL's and self-care;Proper use of Adaptive equipment and DME      AM-PAC PT "6 Clicks" Mobility   Outcome Measure  Help needed turning from your back to your side while in a flat bed without using bedrails?: A Lot Help needed moving from lying on your back to sitting on the side of a flat bed without using bedrails?: A Lot Help needed moving to and from a bed to a chair (including a wheelchair)?: Total Help needed standing up from a chair using your arms (e.g., wheelchair or bedside chair)?: A Lot Help needed  to walk in hospital room?: Total Help needed climbing 3-5 steps with a railing? : Total 6 Click Score: 9    End of Session   Activity Tolerance: Patient tolerated treatment well;Patient limited by fatigue;Patient limited by pain Patient left: in bed;with call bell/phone within reach;with bed alarm set Nurse Communication: Mobility status PT Visit Diagnosis: Unsteadiness on feet (R26.81);Other abnormalities of gait and mobility (R26.89);Muscle weakness (generalized) (M62.81)     Time: [id number] PT Time Calculation (min) (ACUTE ONLY): 32 min  Charges:  $Therapeutic Activity: 23-37 mins                     10:09 AM, 03/07/20 Lonell Grandchild, MPT Physical Therapist with [facility] [phone] office [phone] mobile phone

## 2020-03-07 NOTE — TOC Transition Note (Signed)
Transition of Care [facility]) - CM/SW Discharge Note   Patient Details  Name: CHRIST KIRN MRN: [id number] Date of Birth: 12-Nov-1941  Transition of Care [facility]) CM/SW Contact:  Akeria Hedstrom, Chauncey Reading, RN Phone Number: 03/07/2020, 12:24 PM   Clinical Narrative:   Patient's wife elects [facility] (patient has dementia); stable for discharge today. Wife aware. Kerri from [facility] to call wife and update on visitation policy. DC clinicals sent. Bedside RN to call report.     Final next level of care: Skilled Nursing Facility Barriers to Discharge: Barriers Resolved   Patient Goals and CMS Choice Patient states their goals for this hospitalization and ongoing recovery are:: wife interested in rehab CMS Medicare.gov Compare Post Acute Care list provided to:: Other (Comment Required)(wife- patient sleepy d/t pain medication) Choice offered to / list presented to : Spouse  Discharge Placement              Patient chooses bed at: [facility] Patient to be transferred to facility by: [facility] staff Name of family member notified: wife- [name] Patient and family notified of of transfer: 03/07/20  Discharge Plan and Services   Discharge Planning Services: CM Consult                Readmission Risk Interventions No flowsheet data found.

## 2020-03-07 NOTE — Evaluation (Signed)
Occupational Therapy Evaluation Patient Details Name: Christian [name] MRN: [id number] DOB: 10-16-1941 Today's Date: 03/07/2020    History of Present Illness Christian [name] is a 79 y.o. male s/p INTRAMEDULLARY (IM) NAIL INTERTROCHANTRIC (Left) on 03/05/20 with a history of PE and DVT secondary to prothrombin gene, currently on anticoagulation, type 2 diabetes with poor control, hypertension, dementia, depression.  Patient seen for fall that occurred earlier today.  He was attempting to ambulate and fell when he tried to get out of his chair.  He landed on his left hip and had immediate pain that is nonradiating.  Pain worse with movement and improved with rest.  EMS was called and the patient was taken to the hospital for evaluation.  In conversing with wife, the patient has "given up on life".  He has had limited mobility, eats what he wants, does not take check his blood sugars.  Patient mostly sits in chair or is in bed.  He has had severe deconditioning has been ongoing for the past few months.   Clinical Impression   Pt seen with PT this am, agreeable to session. Pt requiring significant assistance with mobility tasks due to pain/weakness/fatigue. Pt unable to perform seated and standing ADLs due to poor sitting tolerance and need for BUE support in standing. Pt requiring increased assistance for ADLs at bed level as well. Recommend SNF on discharge to improve independence and safety during ADL completion, as well as improve strength required for participation in mobility tasks.     Follow Up Recommendations  SNF    Equipment Recommendations  None recommended by OT       Precautions / Restrictions Precautions Precautions: Fall Restrictions Weight Bearing Restrictions: Yes LLE Weight Bearing: Weight bearing as tolerated      Mobility Bed Mobility Overal bed mobility: Needs Assistance Bed Mobility: Supine to Sit;Sit to Supine     Supine to sit: Mod assist;+2 for physical  assistance Sit to supine: Max assist;+2 for physical assistance   General bed mobility comments: slow labored movement with frequent leaning to the right due to left hip pain  Transfers Overall transfer level: Needs assistance               General transfer comment: Defer to PT note        ADL either performed or assessed with clinical judgement   ADL Overall ADL's : Needs assistance/impaired Eating/Feeding: Set up;Bed level Eating/Feeding Details (indicate cue type and reason): assist for opening packaging. Able to self-feed with utensils             Upper Body Dressing : Maximal assistance;Sitting   Lower Body Dressing: Total assistance;Bed level     Toilet Transfer Details (indicate cue type and reason): unable to transfer at this time. Able to sit/stand however unable to follow through with transfer due to pain, weakness, and fatigue           General ADL Comments: Pt requiring significant assistance with ADLs at this time     Vision Baseline Vision/History: No visual deficits Patient Visual Report: No change from baseline Vision Assessment?: No apparent visual deficits            Pertinent Vitals/Pain Pain Assessment: 0-10 Pain Score: 1  Pain Location: left hip Pain Descriptors / Indicators: Sore;Grimacing;Guarding;Aching Pain Intervention(s): Limited activity within patient's tolerance;Monitored during session;Repositioned     Hand Dominance Right   Extremity/Trunk Assessment Upper Extremity Assessment Upper Extremity Assessment: Generalized weakness   Lower Extremity Assessment Lower  Extremity Assessment: Defer to PT evaluation   Cervical / Trunk Assessment Cervical / Trunk Assessment: Normal   Communication Communication Communication: No difficulties   Cognition Arousal/Alertness: Awake/alert Behavior During Therapy: WFL for tasks assessed/performed Overall Cognitive Status: Within Functional Limits for tasks assessed                                  General Comments: requires repeated verbal/tactile cueing to complete functional tasks              Home Living Family/patient expects to be discharged to:: Private residence Living Arrangements: Spouse/significant other Available Help at Discharge: Family Type of Home: House Home Access: Stairs to enter Technical brewer of Steps: 1 Entrance Stairs-Rails: None Home Layout: Multi-level;Able to live on main level with bedroom/bathroom Alternate Level Stairs-Number of Steps: 11-12 steps to basement Alternate Level Stairs-Rails: Right Bathroom Shower/Tub: Teacher, early years/pre: Standard     Home Equipment: Environmental consultant - 2 wheels;Cane - single point;Shower seat - built in;Bedside commode          Prior Functioning/Environment Level of Independence: Independent with assistive device(s);Needs assistance        Comments: household ambulator using RW, family assists with ADLs as needed        OT Problem List: Decreased strength;Decreased activity tolerance;Impaired balance (sitting and/or standing);Decreased safety awareness;Decreased knowledge of use of DME or AE;Pain      OT Treatment/Interventions: Self-care/ADL training;Therapeutic exercise;Neuromuscular education;DME and/or AE instruction;Patient/family education;Therapeutic activities    OT Goals(Current goals can be found in the care plan section)    OT Frequency: Min 2X/week           Co-evaluation PT/OT/SLP Co-Evaluation/Treatment: Yes Reason for Co-Treatment: Complexity of the patient's impairments (multi-system involvement);For patient/therapist safety;To address functional/ADL transfers   OT goals addressed during session: ADL's and self-care;Proper use of Adaptive equipment and DME         End of Session Equipment Utilized During Treatment: Rolling walker;Oxygen  Activity Tolerance: Patient limited by fatigue;Patient limited by lethargy;Patient limited by  pain Patient left: in bed;with call bell/phone within reach;with bed alarm set  OT Visit Diagnosis: Muscle weakness (generalized) (M62.81);Repeated falls (R29.6);Pain Pain - Right/Left: Left Pain - part of body: Hip                Time: [id number] OT Time Calculation (min): 37 min Charges:  OT General Charges $OT Visit: 1 Visit OT Evaluation $OT Eval Moderate Complexity: Ullin, OTR/L  [phone] 03/07/2020, 9:29 AM

## 2020-03-08 ENCOUNTER — Encounter: Payer: Self-pay | Admitting: Adult Health

## 2020-03-08 ENCOUNTER — Other Ambulatory Visit: Payer: Self-pay | Admitting: Medical

## 2020-03-08 ENCOUNTER — Non-Acute Institutional Stay (SKILLED_NURSING_FACILITY): Payer: Medicare Other | Admitting: Adult Health

## 2020-03-08 ENCOUNTER — Telehealth: Payer: Self-pay

## 2020-03-08 ENCOUNTER — Other Ambulatory Visit: Payer: Self-pay | Admitting: Adult Health

## 2020-03-08 DIAGNOSIS — I2692 Saddle embolus of pulmonary artery without acute cor pulmonale: Secondary | ICD-10-CM | POA: Diagnosis not present

## 2020-03-08 DIAGNOSIS — E0841 Diabetes mellitus due to underlying condition with diabetic mononeuropathy: Secondary | ICD-10-CM | POA: Diagnosis not present

## 2020-03-08 DIAGNOSIS — D62 Acute posthemorrhagic anemia: Secondary | ICD-10-CM | POA: Diagnosis not present

## 2020-03-08 DIAGNOSIS — K5909 Other constipation: Secondary | ICD-10-CM

## 2020-03-08 DIAGNOSIS — N401 Enlarged prostate with lower urinary tract symptoms: Secondary | ICD-10-CM | POA: Diagnosis not present

## 2020-03-08 DIAGNOSIS — E1169 Type 2 diabetes mellitus with other specified complication: Secondary | ICD-10-CM

## 2020-03-08 DIAGNOSIS — I2782 Chronic pulmonary embolism: Secondary | ICD-10-CM

## 2020-03-08 DIAGNOSIS — I825Y9 Chronic embolism and thrombosis of unspecified deep veins of unspecified proximal lower extremity: Secondary | ICD-10-CM | POA: Diagnosis not present

## 2020-03-08 DIAGNOSIS — G4733 Obstructive sleep apnea (adult) (pediatric): Secondary | ICD-10-CM | POA: Diagnosis not present

## 2020-03-08 DIAGNOSIS — F0390 Unspecified dementia without behavioral disturbance: Secondary | ICD-10-CM

## 2020-03-08 DIAGNOSIS — IMO0002 Reserved for concepts with insufficient information to code with codable children: Secondary | ICD-10-CM

## 2020-03-08 DIAGNOSIS — Z794 Long term (current) use of insulin: Secondary | ICD-10-CM

## 2020-03-08 DIAGNOSIS — S72142S Displaced intertrochanteric fracture of left femur, sequela: Secondary | ICD-10-CM

## 2020-03-08 DIAGNOSIS — D6852 Prothrombin gene mutation: Secondary | ICD-10-CM | POA: Diagnosis not present

## 2020-03-08 DIAGNOSIS — E1159 Type 2 diabetes mellitus with other circulatory complications: Secondary | ICD-10-CM | POA: Diagnosis not present

## 2020-03-08 DIAGNOSIS — Z9989 Dependence on other enabling machines and devices: Secondary | ICD-10-CM

## 2020-03-08 DIAGNOSIS — I10 Essential (primary) hypertension: Secondary | ICD-10-CM

## 2020-03-08 DIAGNOSIS — E1165 Type 2 diabetes mellitus with hyperglycemia: Secondary | ICD-10-CM | POA: Diagnosis not present

## 2020-03-08 DIAGNOSIS — E785 Hyperlipidemia, unspecified: Secondary | ICD-10-CM

## 2020-03-08 MED ORDER — HYDROCODONE-ACETAMINOPHEN 5-325 MG PO TABS: 1.0000 | ORAL_TABLET | ORAL | 0 refills | Status: DC | PRN

## 2020-03-08 NOTE — Telephone Encounter (Signed)
If he is in a rehab facility then yes we will have to defer any follow-up here until he gets out.  My guess it will be more than just a few weeks  Please let him know that I am sorry to hear about his fall and fracture  You may want to speak to him and his wife Christian [name]

## 2020-03-08 NOTE — Telephone Encounter (Signed)
The pt. Is on my TOC report he was recently in the hospital for a fall and hip fracture, it says he needs to f/u here in 1 week he is already on the schedule for a med check on 03/14/20 didn't know if you wanted to do the hospital f/u then. He is also in the [facility] right now so not sure if he would be able to come in then or not. A nurse from there called stating that they had him there and said he would probably not be able to come in on the 20th here.
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
## 2020-03-09 ENCOUNTER — Encounter: Payer: Medicare Other | Admitting: Medical

## 2020-03-09 ENCOUNTER — Encounter: Payer: Medicare Other | Admitting: *Deleted

## 2020-03-09 ENCOUNTER — Telehealth: Payer: Self-pay | Admitting: Medical

## 2020-03-09 NOTE — Telephone Encounter (Signed)
Requested records received from [place]

## 2020-03-09 NOTE — Telephone Encounter (Signed)
I called the pts. Wife LM for her to call back to confirm that Mr. Gillingham will not be able to come in on 03/14/20.

## 2020-03-10 ENCOUNTER — Encounter: Payer: Self-pay | Admitting: Adult Health

## 2020-03-10 ENCOUNTER — Other Ambulatory Visit: Payer: Self-pay | Admitting: Adult Health

## 2020-03-10 ENCOUNTER — Non-Acute Institutional Stay (SKILLED_NURSING_FACILITY): Payer: Medicare Other | Admitting: Adult Health

## 2020-03-10 DIAGNOSIS — S72142S Displaced intertrochanteric fracture of left femur, sequela: Secondary | ICD-10-CM

## 2020-03-10 DIAGNOSIS — F0280 Dementia in other diseases classified elsewhere without behavioral disturbance: Secondary | ICD-10-CM | POA: Insufficient documentation

## 2020-03-10 DIAGNOSIS — F0393 Unspecified dementia, unspecified severity, with mood disturbance: Secondary | ICD-10-CM | POA: Insufficient documentation

## 2020-03-10 DIAGNOSIS — Z794 Long term (current) use of insulin: Secondary | ICD-10-CM | POA: Diagnosis not present

## 2020-03-10 DIAGNOSIS — IMO0002 Reserved for concepts with insufficient information to code with codable children: Secondary | ICD-10-CM

## 2020-03-10 DIAGNOSIS — F339 Major depressive disorder, recurrent, unspecified: Secondary | ICD-10-CM

## 2020-03-10 DIAGNOSIS — E1165 Type 2 diabetes mellitus with hyperglycemia: Secondary | ICD-10-CM

## 2020-03-10 MED ORDER — HYDROCODONE-ACETAMINOPHEN 5-325 MG PO TABS: 1.0000 | ORAL_TABLET | ORAL | 0 refills | Status: DC | PRN
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
## 2020-03-11 ENCOUNTER — Encounter (HOSPITAL_COMMUNITY): Payer: Self-pay

## 2020-03-11 ENCOUNTER — Other Ambulatory Visit: Payer: Self-pay

## 2020-03-11 ENCOUNTER — Encounter (HOSPITAL_COMMUNITY)
Admission: RE | Admit: 2020-03-11 | Discharge: 2020-03-11 | Disposition: A | Discharge status: Home / Self Care | Payer: Medicare Other | Source: Skilled Nursing Facility | Attending: *Deleted | Admitting: *Deleted

## 2020-03-11 ENCOUNTER — Emergency Department (HOSPITAL_COMMUNITY): Payer: Medicare Other

## 2020-03-11 ENCOUNTER — Telehealth: Payer: Self-pay | Admitting: Internal Medicine

## 2020-03-11 ENCOUNTER — Inpatient Hospital Stay
Admission: RE | Admit: 2020-03-11 | Discharge: 2020-04-08 | Disposition: A | Discharge status: Home / Self Care | Payer: Medicare Other | Source: Ambulatory Visit | Attending: Internal Medicine | Admitting: Internal Medicine

## 2020-03-11 ENCOUNTER — Emergency Department (HOSPITAL_COMMUNITY)
Admission: EM | Admit: 2020-03-11 | Discharge: 2020-03-11 | Disposition: A | Discharge status: Home / Self Care | Payer: Medicare Other | Attending: Emergency Medicine | Admitting: Emergency Medicine

## 2020-03-11 DIAGNOSIS — Z7901 Long term (current) use of anticoagulants: Secondary | ICD-10-CM | POA: Insufficient documentation

## 2020-03-11 DIAGNOSIS — Z79899 Other long term (current) drug therapy: Secondary | ICD-10-CM | POA: Diagnosis not present

## 2020-03-11 DIAGNOSIS — K5900 Constipation, unspecified: Secondary | ICD-10-CM | POA: Diagnosis present

## 2020-03-11 DIAGNOSIS — Z8673 Personal history of transient ischemic attack (TIA), and cerebral infarction without residual deficits: Secondary | ICD-10-CM | POA: Diagnosis not present

## 2020-03-11 DIAGNOSIS — Z87891 Personal history of nicotine dependence: Secondary | ICD-10-CM | POA: Diagnosis not present

## 2020-03-11 DIAGNOSIS — Z794 Long term (current) use of insulin: Secondary | ICD-10-CM | POA: Insufficient documentation

## 2020-03-11 DIAGNOSIS — E119 Type 2 diabetes mellitus without complications: Secondary | ICD-10-CM | POA: Diagnosis not present

## 2020-03-11 DIAGNOSIS — R140 Abdominal distension (gaseous): Secondary | ICD-10-CM | POA: Diagnosis not present

## 2020-03-11 DIAGNOSIS — I10 Essential (primary) hypertension: Secondary | ICD-10-CM | POA: Diagnosis not present

## 2020-03-11 DIAGNOSIS — F0390 Unspecified dementia without behavioral disturbance: Secondary | ICD-10-CM | POA: Insufficient documentation

## 2020-03-11 DIAGNOSIS — N401 Enlarged prostate with lower urinary tract symptoms: Secondary | ICD-10-CM | POA: Diagnosis not present

## 2020-03-11 DIAGNOSIS — K4090 Unilateral inguinal hernia, without obstruction or gangrene, not specified as recurrent: Secondary | ICD-10-CM | POA: Diagnosis not present

## 2020-03-11 LAB — CBC WITH DIFFERENTIAL/PLATELET
Abs Immature Granulocytes: 0.1 10*3/uL — ABNORMAL HIGH (ref 0.00–0.07)
Abs Immature Granulocytes: 0.05 10*3/uL (ref 0.00–0.07)
Basophils Absolute: 0.1 10*3/uL (ref 0.0–0.1)
Basophils Absolute: 0.1 10*3/uL (ref 0.0–0.1)
Basophils Relative: 1 %
Basophils Relative: 1 %
Eosinophils Absolute: 0.2 10*3/uL (ref 0.0–0.5)
Eosinophils Absolute: 0.2 10*3/uL (ref 0.0–0.5)
Eosinophils Relative: 2 %
Eosinophils Relative: 2 %
HCT: 33.2 % — ABNORMAL LOW (ref 39.0–52.0)
HCT: 33.4 % — ABNORMAL LOW (ref 39.0–52.0)
Hemoglobin: 10.6 g/dL — ABNORMAL LOW (ref 13.0–17.0)
Hemoglobin: 10.5 g/dL — ABNORMAL LOW (ref 13.0–17.0)
Immature Granulocytes: 1 %
Immature Granulocytes: 0 %
Lymphocytes Relative: 9 %
Lymphocytes Relative: 9 %
Lymphs Abs: 0.9 10*3/uL (ref 0.7–4.0)
Lymphs Abs: 1 10*3/uL (ref 0.7–4.0)
MCH: 29.9 pg (ref 26.0–34.0)
MCH: 29.7 pg (ref 26.0–34.0)
MCHC: 31.9 g/dL (ref 30.0–36.0)
MCHC: 31.4 g/dL (ref 30.0–36.0)
MCV: 93.5 fL (ref 80.0–100.0)
MCV: 94.4 fL (ref 80.0–100.0)
Monocytes Absolute: 0.9 10*3/uL (ref 0.1–1.0)
Monocytes Absolute: 0.8 10*3/uL (ref 0.1–1.0)
Monocytes Relative: 9 %
Monocytes Relative: 7 %
Neutro Abs: 7.8 10*3/uL — ABNORMAL HIGH (ref 1.7–7.7)
Neutro Abs: 9.1 10*3/uL — ABNORMAL HIGH (ref 1.7–7.7)
Neutrophils Relative %: 78 %
Neutrophils Relative %: 81 %
Platelets: 415 10*3/uL — ABNORMAL HIGH (ref 150–400)
Platelets: 422 10*3/uL — ABNORMAL HIGH (ref 150–400)
RBC: 3.55 MIL/uL — ABNORMAL LOW (ref 4.22–5.81)
RBC: 3.54 MIL/uL — ABNORMAL LOW (ref 4.22–5.81)
RDW: 12.6 % (ref 11.5–15.5)
RDW: 12.6 % (ref 11.5–15.5)
WBC: 10 10*3/uL (ref 4.0–10.5)
WBC: 11.3 10*3/uL — ABNORMAL HIGH (ref 4.0–10.5)
nRBC: 0 % (ref 0.0–0.2)
nRBC: 0 % (ref 0.0–0.2)

## 2020-03-11 LAB — COMPREHENSIVE METABOLIC PANEL
ALT: 12 U/L (ref 0–44)
ALT: 15 U/L (ref 0–44)
AST: 16 U/L (ref 15–41)
AST: 19 U/L (ref 15–41)
Albumin: 2.6 g/dL — ABNORMAL LOW (ref 3.5–5.0)
Albumin: 2.6 g/dL — ABNORMAL LOW (ref 3.5–5.0)
Alkaline Phosphatase: 88 U/L (ref 38–126)
Alkaline Phosphatase: 86 U/L (ref 38–126)
Anion gap: 10 (ref 5–15)
Anion gap: 10 (ref 5–15)
BUN: 19 mg/dL (ref 8–23)
BUN: 22 mg/dL (ref 8–23)
CO2: 26 mmol/L (ref 22–32)
CO2: 24 mmol/L (ref 22–32)
Calcium: 8.4 mg/dL — ABNORMAL LOW (ref 8.9–10.3)
Calcium: 8.4 mg/dL — ABNORMAL LOW (ref 8.9–10.3)
Chloride: 98 mmol/L (ref 98–111)
Chloride: 99 mmol/L (ref 98–111)
Creatinine, Ser: 0.7 mg/dL (ref 0.61–1.24)
Creatinine, Ser: 0.79 mg/dL (ref 0.61–1.24)
GFR calc Af Amer: >60 mL/min (ref >60)
GFR calc Af Amer: >60 mL/min (ref >60)
GFR calc non Af Amer: >60 mL/min (ref >60)
GFR calc non Af Amer: >60 mL/min (ref >60)
Glucose, Bld: 247 mg/dL — ABNORMAL HIGH (ref 70–99)
Glucose, Bld: 264 mg/dL — ABNORMAL HIGH (ref 70–99)
Potassium: 3.6 mmol/L (ref 3.5–5.1)
Potassium: 4.4 mmol/L (ref 3.5–5.1)
Sodium: 134 mmol/L — ABNORMAL LOW (ref 135–145)
Sodium: 133 mmol/L — ABNORMAL LOW (ref 135–145)
Total Bilirubin: 0.9 mg/dL (ref 0.3–1.2)
Total Bilirubin: 0.9 mg/dL (ref 0.3–1.2)
Total Protein: 6.4 g/dL — ABNORMAL LOW (ref 6.5–8.1)
Total Protein: 6 g/dL — ABNORMAL LOW (ref 6.5–8.1)

## 2020-03-11 MED ORDER — SODIUM CHLORIDE 0.9 % IV SOLN: INTRAVENOUS | Status: DC

## 2020-03-11 MED ORDER — IOHEXOL 300 MG/ML  SOLN
100.0000 mL | Freq: Once | INTRAMUSCULAR | Status: AC | PRN
  Administered 2020-03-11: 100 mL via INTRAVENOUS

## 2020-03-11 NOTE — ED Provider Notes (Signed)
[facility] Provider Note   CSN: [id number] Arrival date & time: 03/11/20  1458     History Chief Complaint  Patient presents with  . Constipation    Christian [name] is a 79 y.o. male.  Patient sent over from [facility].  For concerns for constipation.  Apparently patient has not had a bowel movement in 10 days.  They gave fleets enemas and MiraLAX without any help.  Apparently KUB showed worsening signs.  Patient status post left hip surgery for a left displaced femoral neck fracture on April 9.  Patient denies any abdominal pain any nausea or vomiting.  Any dysuria.        Past Medical History:  Diagnosis Date  . Agitation   . Arthritis   . Cataract   . Chronic back pain   . Confusion   . Diabetes ([place])    type 2  . DVT (deep venous thrombosis) ([place]) 01/2015  . Hearing impaired   . High cholesterol   . Hypertension   . Insomnia   . Insomnia   . Leg pain, diffuse   . Leg swelling   . Memory loss   . Mild cognitive impairment    sees [facility]  . Numbness    fingers, feet, toes  . OSA on CPAP   . 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 06/14/2015  . Pulmonary embolism ([place]) 01/2015  . Spinal stenosis    lumbar  . Tremor    on propranolol  . Wears glasses     Patient Active Problem List   Diagnosis Date Noted  . Depression due to dementia ([place]) 03/10/2020  . Displaced intertrochanteric fracture of left femur, initial encounter for closed fracture ([place])   . S/p left hip fracture 03/03/2020  . Benign prostatic hyperplasia with lower urinary tract symptoms 06/04/2019  . Urge incontinence 06/04/2019  . History of stroke 06/02/2019  . Medicare annual wellness visit, subsequent 06/02/2019  . Urinary incontinence 10/28/2018  . Polyuria 10/28/2018  . Dermatitis 10/28/2018  . Tinea cruris 03/25/2018  . Tick bite 03/25/2018  . Balanitis 03/25/2018  . At moderate risk for fall  12/31/2017  . Alcohol abuse 12/29/2017  . History of pulmonary embolism 12/29/2017  . Depression, recurrent ([place]) 12/29/2017  . Alcohol intoxication ([place]) 12/16/2017  . Wears hearing aid 06/09/2017  . Tinnitus of both ears 06/09/2017  . Bilateral impacted cerumen 06/09/2017  . Left wrist pain 06/26/2016  . Morning joint stiffness 06/26/2016  . Polyarthralgia 06/26/2016  . Gait disturbance 04/02/2016  . Physical deconditioning 04/02/2016  . Pain of both shoulder joints 04/02/2016  . Leg weakness, bilateral 04/02/2016  . Encounter for therapeutic drug monitoring 11/29/2015  . Encounter for health maintenance examination in adult 10/30/2015  . Shoulder pain, bilateral 10/30/2015  . Muscle weakness 10/30/2015  . Generalized weakness 10/30/2015  . Rash 10/30/2015  . PVC (premature ventricular contraction) 10/10/2015  . Decreased energy 06/20/2015  . DVT (deep venous thrombosis) ([place]) 06/20/2015  . Hammer toe of left foot 06/20/2015  . Pre-ulcerative calluses 06/20/2015  . Hypertrophic toenail 06/20/2015  . Diabetic mononeuropathy associated with diabetes mellitus due to underlying condition ([place]) 06/20/2015  . Leg pain, bilateral 06/20/2015  . Chronic low back pain 06/20/2015  . 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 06/14/2015  . OSA on CPAP 03/06/2015  . Edema 03/06/2015  . Leg pain, diffuse 03/06/2015  . Bilateral low back  pain without sciatica 03/06/2015  . Tremor 03/06/2015  . Insomnia 03/06/2015  . Vaccine counseling 03/06/2015  . Chronic back pain 03/06/2015  . Wears glasses 03/06/2015  . Hearing loss 03/06/2015  . Spinal stenosis of lumbar region 03/06/2015  . Ptosis 03/06/2015  . Positive depression screening 03/06/2015  . Advance directive discussed with patient 03/06/2015  . Bradycardia 02/27/2015  . Current use of long term anticoagulation   . Dementia without behavioral disturbance ([place])   . Essential  hypertension 02/08/2015  . Hyperlipidemia 02/08/2015  . OSA (obstructive sleep apnea) 02/08/2015  . Memory loss 03/12/2013  . Diabetes mellitus ([place]) 03/12/2013    Past Surgical History:  Procedure Laterality Date  . CATARACT EXTRACTION    . COLONOSCOPY  2014  . INTRAMEDULLARY (IM) NAIL INTERTROCHANTERIC Left 03/05/2020   Procedure: INTRAMEDULLARY (IM) NAIL INTERTROCHANTRIC;  Surgeon: Carole Civil, MD;  Location: [facility];  Service: Orthopedics;  Laterality: Left;  . TONSILLECTOMY         Family History  Problem Relation Age of Onset  . Dementia Father   . Clotting disorder Mother   . Heart disease Brother   . Clotting disorder Brother   . Psychiatric Illness Sister   . Leukemia Sister     Social History   Tobacco Use  . Smoking status: Former Smoker    Packs/day: 1.00    Years: 10.00    Pack years: 10.00    Types: Cigarettes    Quit date: 11/25/2004    Years since quitting: 15.3  . Smokeless tobacco: Never Used  Substance Use Topics  . Alcohol use: Yes    Alcohol/week: 1.0 standard drinks    Types: 1 Glasses of wine per week    Comment: once a month per pt   . Drug use: Not Currently    Home Medications Prior to Admission medications   Medication Sig Start Date End Date Taking? Authorizing Provider  diltiazem (CARDIZEM) 30 MG tablet TAKE 1 TABLET(30 MG) BY MOUTH TWICE DAILY Patient taking differently: Take 30 mg by mouth 2 (two) times daily.  01/03/20   Arnoldo Lenis, MD  docusate sodium (COLACE) 100 MG capsule Take 1 capsule (100 mg total) by mouth 2 (two) times daily. 03/07/20   Manuella Ghazi, Pratik D, DO  donepezil (ARICEPT) 10 MG tablet Take 1 tablet (10 mg) by mouth at bedtime. 01/17/20   Lomax, Amy, NP  flecainide (TAMBOCOR) 150 MG tablet TAKE 1/2 TABLET(75 MG) BY MOUTH TWICE DAILY 10/25/19   Evans Lance, MD  folic acid (FOLVITE) 1 MG tablet TAKE 1 TABLET(1 MG) BY MOUTH DAILY Patient taking differently: Take 1 mg by mouth daily.  07/21/19   Tysinger, Camelia Eng, PA-C  furosemide (LASIX) 40 MG tablet Take 40 mg daily AS NEEDED  For swelling. 10/09/18   Arnoldo Lenis, MD  HYDROcodone-acetaminophen (NORCO/VICODIN) 5-325 MG tablet Take 1 tablet by mouth every 6 (six) hours as needed for moderate pain. 03/10/20 03/14/20  [provider]  insulin degludec (TRESIBA FLEXTOUCH) 100 UNIT/ML FlexTouch Pen Inject 46 Units into the skin daily.    [provider]  insulin lispro (HUMALOG KWIKPEN) 100 UNIT/ML KwikPen Inject 5 Units into the skin 3 (three) times daily with meals. For cbg > 150    [provider]  memantine (NAMENDA XR) 28 MG CP24 24 hr capsule TAKE 1 CAPSULE(28 MG) BY MOUTH DAILY 08/03/19   Star Age, MD  metFORMIN (GLUCOPHAGE) 850 MG tablet TAKE 1 TABLET(850 MG) BY MOUTH TWICE  DAILY WITH A MEAL Patient taking differently: Take 850 mg by mouth 2 (two) times daily with a meal.  03/08/20   Tysinger, Camelia Eng, PA-C  Misc Natural Products (OSTEO BI-FLEX ADV JOINT SHIELD PO) Take 2 tablets by mouth daily.     [provider]  naproxen sodium (ALEVE) 220 MG tablet Take 220 mg by mouth daily.    [provider]  NON FORMULARY Diet: _____ Regular, ___x___ NAS, ____x___Consistent Carbohydrate, _______NPO _____Other    [provider]  OXYGEN Inhale 2 L into the lungs continuous.    [provider]  polyethylene glycol (MIRALAX / GLYCOLAX) 17 g packet Take 17 g by mouth daily as needed for mild constipation. 03/07/20   Manuella Ghazi, Pratik D, DO  rosuvastatin (CRESTOR) 10 MG tablet TAKE 1 TABLET(10 MG) BY MOUTH AT BEDTIME Patient taking differently: Take 10 mg by mouth at bedtime.  11/18/19   Tysinger, Camelia Eng, PA-C  sertraline (ZOLOFT) 50 MG tablet Take 50 mg by mouth daily. 03/11/20   [provider]  tamsulosin (FLOMAX) 0.4 MG CAPS capsule TAKE 1 CAPSULE(0.4 MG) BY MOUTH DAILY Patient taking differently: Take 0.4 mg by mouth daily.  03/08/20   Tysinger, Camelia Eng, PA-C  tolterodine (DETROL LA) 4  MG 24 hr capsule Take 4 mg by mouth 2 (two) times daily. 02/18/20   [provider]  XARELTO 20 MG TABS tablet TAKE 1 TABLET BY MOUTH EVERY DAY WITH SUPPER Patient taking differently: Take 20 mg by mouth daily with supper.  01/17/20   Tysinger, Camelia Eng, PA-C    Allergies    Bee venom, Ultram [tramadol hcl], Oxycodone, and Oxycontin [oxycodone hcl]  Review of Systems   Review of Systems  Constitutional: Negative for chills and fever.  HENT: Negative for rhinorrhea and sore throat.   Eyes: Negative for visual disturbance.  Respiratory: Negative for cough and shortness of breath.   Cardiovascular: Negative for chest pain and leg swelling.  Gastrointestinal: Positive for constipation. Negative for abdominal pain, diarrhea, nausea and vomiting.  Genitourinary: Negative for dysuria.  Musculoskeletal: Negative for back pain and neck pain.  Skin: Negative for rash.  Neurological: Negative for dizziness, light-headedness and headaches.  Hematological: Does not bruise/bleed easily.  Psychiatric/Behavioral: Negative for confusion.    Physical Exam Updated Vital Signs BP 132/62   Pulse 73   Temp 98.5 F (36.9 C) (Oral)   Resp 13   Ht 1.803 m (5\' 11" )   Wt 113 kg   SpO2 90%   BMI 34.75 kg/m   Physical Exam Vitals and nursing note reviewed.  Constitutional:      Appearance: Normal appearance. He is well-developed.  HENT:     Head: Normocephalic and atraumatic.  Eyes:     Extraocular Movements: Extraocular movements intact.     Conjunctiva/sclera: Conjunctivae normal.     Pupils: Pupils are equal, round, and reactive to light.  Cardiovascular:     Rate and Rhythm: Normal rate and regular rhythm.     Heart sounds: No murmur.  Pulmonary:     Effort: Pulmonary effort is normal. No respiratory distress.     Breath sounds: Normal breath sounds.  Abdominal:     General: There is distension.     Palpations: Abdomen is soft.     Tenderness: There is no abdominal tenderness.      Comments: Abdomen is distended but nontender.  Musculoskeletal:        General: Normal range of motion.     Cervical  back: Neck supple.     Comments: Bandage on the left hip area no erythema no evidence of any significant discharge.  Distally cap refill to both toes is less than 2 seconds.  Skin:    General: Skin is warm and dry.     Capillary Refill: Capillary refill takes less than 2 seconds.  Neurological:     General: No focal deficit present.     Mental Status: He is alert. Mental status is at baseline.     ED Results / Procedures / Treatments   Labs (all labs ordered are listed, but only abnormal results are displayed) Labs Reviewed  CBC WITH DIFFERENTIAL/PLATELET - Abnormal; Notable for the following components:      Result Value   WBC 11.3 (*)    RBC 3.54 (*)    Hemoglobin 10.5 (*)    HCT 33.4 (*)    Platelets 422 (*)    Neutro Abs 9.1 (*)    All other components within normal limits  COMPREHENSIVE METABOLIC PANEL - Abnormal; Notable for the following components:   Sodium 133 (*)    Glucose, Bld 264 (*)    Calcium 8.4 (*)    Total Protein 6.0 (*)    Albumin 2.6 (*)    All other components within normal limits    EKG None  Radiology CT Abdomen Pelvis W Contrast  Result Date: 03/11/2020 CLINICAL DATA:  No bowel movement for the past 10 days. Status post left hip fracture repair on 03/05/2020. EXAM: CT ABDOMEN AND PELVIS WITH CONTRAST TECHNIQUE: Multidetector CT imaging of the abdomen and pelvis was performed using the standard protocol following bolus administration of intravenous contrast. CONTRAST:  15mL OMNIPAQUE IOHEXOL 300 MG/ML  SOLN COMPARISON:  None. FINDINGS: Lower chest: Minimal right pleural effusion. Minimal bibasilar atelectasis. Mild coronary artery calcification. Borderline enlarged heart. Hepatobiliary: Small right lobe liver cyst. Unremarkable gallbladder. Pancreas: Moderate diffuse pancreatic atrophy. Spleen: Normal in size without focal  abnormality. Adrenals/Urinary Tract: Adrenal glands are unremarkable. Kidneys are normal, without renal calculi, focal lesion, or hydronephrosis. Bladder is unremarkable. Stomach/Bowel: Unremarkable stomach, small bowel and colon. No evidence of appendicitis. Vascular/Lymphatic: Atheromatous arterial calcifications without aneurysm. No enlarged lymph nodes. Reproductive: Mild to moderate enlargement of the prostate gland. Other: Bilateral subcutaneous edema at the level of the pelvis and proximal thighs, greater on the left. Small bilateral inguinal hernias containing fat. Musculoskeletal: Hardware fixation of the previously demonstrated intertrochanteric fracture of the left femur with multiple mildly displaced fracture fragments and multiple adjacent locules of air or gas anteromedially in an area of mildly heterogeneous soft tissue density. No discrete fluid collection or rim enhancement seen. There are also some air locules and fluid in the nearby subcutaneous fat laterally with overlying skin clips. IMPRESSION: 1. Hardware fixation of the previously demonstrated intertrochanteric fracture of the left femur with multiple mildly displaced fracture fragments and multiple adjacent locules of air or gas anteromedially in an area of mildly heterogeneous soft tissue density. These most likely represent normal postsurgical changes. Developing infection would need to be excluded clinically. 2. Minimal right pleural effusion. 3. Mild coronary artery atherosclerosis. 4. Mild to moderate prostatic hypertrophy. Electronically Signed   By: Claudie Revering M.D.   On: 03/11/2020 17:02    Procedures Procedures (including critical care time)  Medications Ordered in ED Medications  0.9 %  sodium chloride infusion ( Intravenous New Bag/Given 03/11/20 1615)  iohexol (OMNIPAQUE) 300 MG/ML solution 100 mL (100 mLs Intravenous Contrast Given 03/11/20 1633)  ED Course  I have reviewed the triage vital signs and the nursing  notes.  Pertinent labs & imaging results that were available during my care of the patient were reviewed by me and considered in my medical decision making (see chart for details).    MDM Rules/Calculators/A&P                      Labs here patient with a mild leukocytosis white count was 11.3.  No fever.  CT scan of the abdomen shows no significant constipation.  Or any other significant abnormalities.  Seems to be normal surgical changes around the left hip area.  Patient stable for discharge back to [facility].  Final Clinical Impression(s) / ED Diagnoses Final diagnoses:  Abdominal distention    Rx / DC Orders ED Discharge Orders    None       Fredia Sorrow, MD 03/11/20 1727

## 2020-03-11 NOTE — ED Triage Notes (Signed)
[facility] nursing staff report that pt was admitted on 03/07/20 s/p left hip fx repair. Pt has not had BM in 10 days despite interventions at [facility] such as fleets enema and miralax. Pt has had 2 KUB with worsening of last KUB

## 2020-03-11 NOTE — Discharge Instructions (Addendum)
CT scan of the abdomen shows no significant constipation.  No complicating factors either.  Patient could have MiraLAX on a daily basis.  But since there is no significant evidence of constipation based on the CT scan not mandatory.  Stable for discharge back to nursing facility.  Labs without any significant abnormalities.  We did not check a urinalysis.

## 2020-03-11 NOTE — Telephone Encounter (Addendum)
Received call from Facility. Patient has not had Bowel movement for past 1 week. Abdomen mildily distended. Right Lower quadrant tender. No stool in rectum. No Nausea or vomiting. Eating.. Vitals are stable. Has failed Miralax and Fleets Enema  Previous Abdomen Xray showed Moderate Stool in Colon with Gaseous Dilatation  Will Plan for repeat KUB and CMP and CBC to make sure Electrolytes are stable and no Infection.  Repeat Labs are WNL. No White Count. But Repeat KUB shows Worsening of Colon Dilatation with more stools. Patient also now c/o Pain. Will send to ED for further Eval

## 2020-03-13 ENCOUNTER — Other Ambulatory Visit: Payer: Self-pay | Admitting: Medical

## 2020-03-13 ENCOUNTER — Encounter: Payer: Self-pay | Admitting: Adult Health

## 2020-03-13 ENCOUNTER — Non-Acute Institutional Stay (SKILLED_NURSING_FACILITY): Payer: Medicare Other | Admitting: Adult Health

## 2020-03-13 DIAGNOSIS — K5909 Other constipation: Secondary | ICD-10-CM | POA: Diagnosis not present
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
## 2020-03-13 NOTE — Telephone Encounter (Signed)
Appears appointment for tomorrow has been canceled. Please advise refill request.

## 2020-03-14 ENCOUNTER — Encounter: Payer: Self-pay | Admitting: Adult Health

## 2020-03-14 ENCOUNTER — Non-Acute Institutional Stay (SKILLED_NURSING_FACILITY): Payer: Medicare Other | Admitting: Adult Health

## 2020-03-14 ENCOUNTER — Other Ambulatory Visit: Payer: Self-pay | Admitting: *Deleted

## 2020-03-14 ENCOUNTER — Encounter: Payer: Medicare Other | Admitting: Medical

## 2020-03-14 DIAGNOSIS — S72142S Displaced intertrochanteric fracture of left femur, sequela: Secondary | ICD-10-CM | POA: Diagnosis not present

## 2020-03-14 DIAGNOSIS — D62 Acute posthemorrhagic anemia: Secondary | ICD-10-CM | POA: Insufficient documentation

## 2020-03-14 DIAGNOSIS — K5909 Other constipation: Secondary | ICD-10-CM | POA: Insufficient documentation

## 2020-03-14 DIAGNOSIS — E1169 Type 2 diabetes mellitus with other specified complication: Secondary | ICD-10-CM | POA: Insufficient documentation
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
## 2020-03-14 NOTE — Patient Outreach (Signed)
Screened for potential [facility] Care Management needs as a benefit of  [employer].  Mr. Toranzo is currently receiving skilled therapy at [facility].  Writer attended telephonic interdisciplinary team meeting to assess for disposition needs and transition plan for resident.   Facility reports member is from home with wife. He is max assist and has confusion. Has had issues with constipation.   Will plan outreach to wife as appropriate. Will continue to follow for transition plans and potential [facility] Care Management services while member resides in SNF.    [name], MSN-Ed, RN,BSN [place] Acute Care Coordinator [phone] [facility]) [phone]  (Toll free office)

## 2020-03-15 ENCOUNTER — Non-Acute Institutional Stay (SKILLED_NURSING_FACILITY): Payer: Medicare Other | Admitting: Internal Medicine

## 2020-03-15 ENCOUNTER — Encounter: Payer: Self-pay | Admitting: Internal Medicine

## 2020-03-15 DIAGNOSIS — Z7901 Long term (current) use of anticoagulants: Secondary | ICD-10-CM

## 2020-03-15 DIAGNOSIS — E785 Hyperlipidemia, unspecified: Secondary | ICD-10-CM | POA: Diagnosis not present

## 2020-03-15 DIAGNOSIS — F0390 Unspecified dementia without behavioral disturbance: Secondary | ICD-10-CM | POA: Diagnosis not present

## 2020-03-15 DIAGNOSIS — D6852 Prothrombin gene mutation: Secondary | ICD-10-CM | POA: Diagnosis not present

## 2020-03-15 DIAGNOSIS — S72142S Displaced intertrochanteric fracture of left femur, sequela: Secondary | ICD-10-CM

## 2020-03-15 DIAGNOSIS — N401 Enlarged prostate with lower urinary tract symptoms: Secondary | ICD-10-CM | POA: Diagnosis not present

## 2020-03-15 NOTE — Progress Notes (Signed)
: Provider:  Hennie Duos., MD Location:  [facility] Room Number: 155-P Place of Service:  SNF ([id number])  PCP: Carlena Hurl, PA-C Patient Care Team: Tysinger, Camelia Eng, PA-C as PCP - General (Family Medicine) Harl Bowie Alphonse Guild, MD as PCP - Cardiology (Cardiology)  Extended Emergency Contact Information Primary Emergency Contact: [facility] Address: [address]          [place], [place] [address] Johnnette Litter of [place] Phone: [phone] Mobile Phone: [phone] Relation: Spouse     Allergies: Bee venom, Ultram [tramadol hcl], Oxycodone, and Oxycontin [oxycodone hcl]  Chief Complaint  Patient presents with  . New Admit To SNF    New admission     HPI: Patient is a 79 y.o. male with history of PE and DVT secondary to the prothrombin gene, i.e. factor to see, currently on anticoagulation, diabetes mellitus type 2 with poor control, hypertension, dementia, depression, who was seen in the [facility], ED for a fall that occurred earlier that day.  He fell trying to get out of his car and fell on his left hip and had immediate pain.  EMS was called and he was taken to [facility] for evaluation per patient's wife the patient has "given up on life", has limited mobility, is 40 minutes, does not check his blood sugar, mostly sits in his chair or in bed and has severe deconditioning ongoing for the past prior months.  Patient was admitted to [facility] from 4/9-13 where he underwent a left ORIF on 4/11 "Xarelto was restarted.  Patient is admitted to skilled nursing facility for OT/PT.  While at skilled nursing facility patient will be followed for dementia treated with with Namenda and Aricept, history of A PE in 2016 on chronic Xarelto and hyperlipidemia treated with Crestor.  Past Medical History:  Diagnosis Date  . Agitation   . Arthritis   . Cataract   . Chronic back pain   . Confusion   . Diabetes ([place])    type 2  . DVT (deep venous thrombosis)  ([place]) 01/2015  . Hearing impaired   . High cholesterol   . Hypertension   . Insomnia   . Insomnia   . Leg pain, diffuse   . Leg swelling   . Memory loss   . Mild cognitive impairment    sees [facility]  . Numbness    fingers, feet, toes  . OSA on CPAP   . 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 06/14/2015  . Pulmonary embolism ([place]) 01/2015  . Spinal stenosis    lumbar  . Tremor    on propranolol  . Wears glasses     Past Surgical History:  Procedure Laterality Date  . CATARACT EXTRACTION    . COLONOSCOPY  2014  . INTRAMEDULLARY (IM) NAIL INTERTROCHANTERIC Left 03/05/2020   Procedure: INTRAMEDULLARY (IM) NAIL INTERTROCHANTRIC;  Surgeon: Carole Civil, MD;  Location: [facility];  Service: Orthopedics;  Laterality: Left;  . TONSILLECTOMY      Allergies as of 03/15/2020      Reactions   Bee Venom Swelling   Ultram [tramadol Hcl]    Makes him wired, gives insomnia   Oxycodone Other (See Comments)   Mental status changes   Oxycontin [oxycodone Hcl] Other (See Comments)   Mental status changes per pt      Medication List    Notice   This visit is during an admission. Changes to  the med list made in this visit will be reflected in the After Visit Summary of the admission.    Current Outpatient Medications on File Prior to Visit  Medication Sig Dispense Refill  . Amino Acids-Protein Hydrolys (FEEDING SUPPLEMENT, PRO-STAT SUGAR FREE 64,) LIQD Take 30 mLs by mouth 2 (two) times daily between meals. Due to increased protein needs following surgery    . diltiazem (CARDIZEM) 30 MG tablet TAKE 1 TABLET(30 MG) BY MOUTH TWICE DAILY 60 tablet 6  . docusate sodium (COLACE) 100 MG capsule Take 1 capsule (100 mg total) by mouth 2 (two) times daily. 10 capsule 0  . donepezil (ARICEPT) 10 MG tablet Take 1 tablet (10 mg) by mouth at bedtime. 90 tablet 1  . flecainide (TAMBOCOR) 150 MG tablet TAKE 1/2 TABLET(75 MG) BY MOUTH TWICE  DAILY 90 tablet 3  . folic acid (FOLVITE) 1 MG tablet TAKE 1 TABLET(1 MG) BY MOUTH DAILY 90 tablet 3  . furosemide (LASIX) 40 MG tablet Take 40 mg daily AS NEEDED  For swelling. 90 tablet 3  . insulin degludec (TRESIBA FLEXTOUCH) 100 UNIT/ML FlexTouch Pen Inject 46 Units into the skin daily.    . insulin lispro (HUMALOG KWIKPEN) 100 UNIT/ML KwikPen Inject 5 Units into the skin 3 (three) times daily with meals. For cbg > 150    . memantine (NAMENDA XR) 28 MG CP24 24 hr capsule TAKE 1 CAPSULE(28 MG) BY MOUTH DAILY 30 capsule 11  . metFORMIN (GLUCOPHAGE) 850 MG tablet TAKE 1 TABLET(850 MG) BY MOUTH TWICE DAILY WITH A MEAL 180 tablet 0  . Misc Natural Products (OSTEO BI-FLEX ADV JOINT SHIELD PO) Take 2 tablets by mouth daily.     . naproxen sodium (ALEVE) 220 MG tablet Take 220 mg by mouth daily.    . NON FORMULARY Diet: _____ Regular, ___x___ NAS, ____x___Consistent Carbohydrate, _______NPO _____Other    . OXYGEN Inhale 2 L into the lungs continuous.    . polyethylene glycol (MIRALAX / GLYCOLAX) 17 g packet Take 17 g by mouth 2 (two) times daily.    . rosuvastatin (CRESTOR) 10 MG tablet TAKE 1 TABLET(10 MG) BY MOUTH AT BEDTIME 30 tablet 0  . sertraline (ZOLOFT) 50 MG tablet Take 50 mg by mouth daily.    . tamsulosin (FLOMAX) 0.4 MG CAPS capsule TAKE 1 CAPSULE(0.4 MG) BY MOUTH DAILY 30 capsule 0  . tolterodine (DETROL LA) 4 MG 24 hr capsule Take 4 mg by mouth 2 (two) times daily.    Alveda Reasons 20 MG TABS tablet TAKE 1 TABLET BY MOUTH EVERY DAY WITH SUPPER 90 tablet 0   No current facility-administered medications on file prior to visit.     No orders of the defined types were placed in this encounter.   Immunization History  Administered Date(s) Administered  . Influenza Split 08/28/2013, 09/01/2014  . Influenza, High Dose Seasonal PF 08/21/2017, 08/17/2019, 09/30/2019  . Influenza,inj,Quad PF,6+ Mos 09/08/2015, 07/09/2018  . Pneumococcal Conjugate-13 08/21/2017  .  Pneumococcal-Unspecified 08/25/2014  . Zoster Recombinat (Shingrix) 09/30/2019    Social History   Tobacco Use  . Smoking status: Former Smoker    Packs/day: 1.00    Years: 10.00    Pack years: 10.00    Types: Cigarettes    Quit date: 11/25/2004    Years since quitting: 15.3  . Smokeless tobacco: Never Used  Substance Use Topics  . Alcohol use: Yes    Alcohol/week: 1.0 standard drinks    Types: 1 Glasses of wine per week  Comment: once a month per pt     Family history is   Family History  Problem Relation Age of Onset  . Dementia Father   . Clotting disorder Mother   . Heart disease Brother   . Clotting disorder Brother   . Psychiatric Illness Sister   . Leukemia Sister       Review of Systems unable to obtain secondary to dementia   Vitals:   03/15/20 1332  BP: 125/68  Pulse: 71  Resp: 20  Temp: 97.8 F (36.6 C)    SpO2 Readings from Last 1 Encounters:  03/11/20 98%   Body mass index is 33.47 kg/m.     Physical Exam  GENERAL APPEARANCE: Alert, conversant,  No acute distress.  SKIN: Incision line without abnormal heat or swelling HEAD: Normocephalic, atraumatic  EYES: Conjunctiva/lids clear. Pupils round, reactive. EOMs intact.  EARS: External exam WNL, canals clear. Hearing grossly normal.  NOSE: No deformity or discharge.  MOUTH/THROAT: Lips w/o lesions  RESPIRATORY: Breathing is even, unlabored. Lung sounds are clear   CARDIOVASCULAR: Heart RRR no murmurs, rubs or gallops.  Trace left lower extremity peripheral edema.   GASTROINTESTINAL: Abdomen is soft, non-tender, not distended w/ normal bowel sounds. GENITOURINARY: Bladder non tender, not distended  MUSCULOSKELETAL: No abnormal joints or musculature NEUROLOGIC:  Cranial nerves 2-12 grossly intact. Moves all extremities  PSYCHIATRIC: Mood and affect with dementia,, no behavioral issues  Patient Active Problem List   Diagnosis Date Noted  . Hyperlipidemia associated with type 2 diabetes  mellitus ([place]) 03/14/2020  . Acute blood loss anemia 03/14/2020  . Chronic constipation 03/14/2020  . Depression due to dementia ([place]) 03/10/2020  . Displaced intertrochanteric fracture of left femur ([place])   . S/p left hip fracture 03/03/2020  . Benign prostatic hyperplasia with lower urinary tract symptoms 06/04/2019  . Urge incontinence 06/04/2019  . History of stroke 06/02/2019  . Medicare annual wellness visit, subsequent 06/02/2019  . Urinary incontinence 10/28/2018  . Polyuria 10/28/2018  . Dermatitis 10/28/2018  . Tinea cruris 03/25/2018  . Tick bite 03/25/2018  . Balanitis 03/25/2018  . At moderate risk for fall 12/31/2017  . Alcohol abuse 12/29/2017  . History of pulmonary embolism 12/29/2017  . Depression, recurrent ([place]) 12/29/2017  . Alcohol intoxication ([place]) 12/16/2017  . Wears hearing aid 06/09/2017  . Tinnitus of both ears 06/09/2017  . Bilateral impacted cerumen 06/09/2017  . Left wrist pain 06/26/2016  . Morning joint stiffness 06/26/2016  . Polyarthralgia 06/26/2016  . Insulin dependent type 2 diabetes mellitus, uncontrolled ([place]) 04/02/2016  . Gait disturbance 04/02/2016  . Physical deconditioning 04/02/2016  . Pain of both shoulder joints 04/02/2016  . Leg weakness, bilateral 04/02/2016  . Encounter for therapeutic drug monitoring 11/29/2015  . Encounter for health maintenance examination in adult 10/30/2015  . Shoulder pain, bilateral 10/30/2015  . Muscle weakness 10/30/2015  . Generalized weakness 10/30/2015  . Rash 10/30/2015  . PVC (premature ventricular contraction) 10/10/2015  . Decreased energy 06/20/2015  . Chronic pulmonary embolism ([place]) 06/20/2015  . DVT (deep venous thrombosis) ([place]) 06/20/2015  . Hammer toe of left foot 06/20/2015  . Pre-ulcerative calluses 06/20/2015  . Hypertrophic toenail 06/20/2015  . Diabetic mononeuropathy associated with diabetes mellitus due to underlying condition ([place]) 06/20/2015  . Leg pain, bilateral  06/20/2015  . Chronic low back pain 06/20/2015  . 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 06/14/2015  . OSA on CPAP 03/06/2015  . Edema 03/06/2015  . Leg  pain, diffuse 03/06/2015  . Bilateral low back pain without sciatica 03/06/2015  . Tremor 03/06/2015  . Insomnia 03/06/2015  . Vaccine counseling 03/06/2015  . Chronic back pain 03/06/2015  . Wears glasses 03/06/2015  . Hearing loss 03/06/2015  . Spinal stenosis of lumbar region 03/06/2015  . Ptosis 03/06/2015  . Positive depression screening 03/06/2015  . Advance directive discussed with patient 03/06/2015  . Bradycardia 02/27/2015  . Current use of long term anticoagulation   . Dementia without behavioral disturbance ([place])   . Hypertension associated with type 2 diabetes mellitus ([place]) 02/08/2015  . Hyperlipidemia 02/08/2015  . OSA (obstructive sleep apnea) 02/08/2015  . Memory loss 03/12/2013  . Diabetes mellitus ([place]) 03/12/2013      Labs reviewed: Basic Metabolic Panel:    Component Value Date/Time   NA 133 (L) 03/11/2020 1531   NA 140 06/02/2019 1235   K 4.4 03/11/2020 1531   CL 99 03/11/2020 1531   CO2 24 03/11/2020 1531   GLUCOSE 264 (H) 03/11/2020 1531   BUN 22 03/11/2020 1531   BUN 11 06/02/2019 1235   CREATININE 0.79 03/11/2020 1531   CREATININE 0.75 08/21/2017 1245   CALCIUM 8.4 (L) 03/11/2020 1531   PROT 6.0 (L) 03/11/2020 1531   PROT 6.7 07/09/2018 1404   ALBUMIN 2.6 (L) 03/11/2020 1531   ALBUMIN 4.3 07/09/2018 1404   AST 19 03/11/2020 1531   ALT 15 03/11/2020 1531   ALKPHOS 86 03/11/2020 1531   BILITOT 0.9 03/11/2020 1531   BILITOT 0.3 07/09/2018 1404   GFRNONAA >60 03/11/2020 1531   GFRAA >60 03/11/2020 1531    Recent Labs    03/07/20 0441 03/11/20 1023 03/11/20 1531  NA 136 134* 133*  K 3.8 3.6 4.4  CL 102 98 99  CO2 27 26 24   GLUCOSE 198* 247* 264*  BUN 10 19 22   CREATININE 0.66 0.70 0.79  CALCIUM 8.2* 8.4* 8.4*   Liver Function  Tests: Recent Labs    03/03/20 1811 03/11/20 1023 03/11/20 1531  AST 17 16 19   ALT 15 12 15   ALKPHOS 86 88 86  BILITOT 0.5 0.9 0.9  PROT 6.7 6.4* 6.0*  ALBUMIN 3.5 2.6* 2.6*   No results for input(s): LIPASE, AMYLASE in the last 8760 hours. No results for input(s): AMMONIA in the last 8760 hours. CBC: Recent Labs    05/11/19 2144 03/03/20 1811 03/07/20 0441 03/11/20 1023 03/11/20 1531  WBC 8.1   < > 10.1 10.0 11.3*  NEUTROABS 5.9  --   --  7.8* 9.1*  HGB 14.8   < > 9.5* 10.6* 10.5*  HCT 45.8   < > 30.0* 33.2* 33.4*  MCV 91.2   < > 95.2 93.5 94.4  PLT 258   < > 233 415* 422*   < > = values in this interval not displayed.   Lipid Recent Labs    06/02/19 1235  CHOL 129  HDL 43  LDLCALC 67  TRIG 96    Cardiac Enzymes: No results for input(s): CKTOTAL, CKMB, CKMBINDEX, TROPONINI in the last 8760 hours. BNP: No results for input(s): BNP in the last 8760 hours. Lab Results  Component Value Date   MICROALBUR 1.9 06/09/2017   Lab Results  Component Value Date   HGBA1C 10.9 (H) 03/04/2020   Lab Results  Component Value Date   TSH 0.776 06/02/2019   Lab Results  Component Value Date   [id number] 11/27/2016   No results found for: FOLATE Lab Results  Component Value  Date   FERRITIN 41 09/14/2015    Imaging and Procedures obtained prior to SNF admission: CT Abdomen Pelvis W Contrast  Result Date: 03/11/2020 CLINICAL DATA:  No bowel movement for the past 10 days. Status post left hip fracture repair on 03/05/2020. EXAM: CT ABDOMEN AND PELVIS WITH CONTRAST TECHNIQUE: Multidetector CT imaging of the abdomen and pelvis was performed using the standard protocol following bolus administration of intravenous contrast. CONTRAST:  120mL OMNIPAQUE IOHEXOL 300 MG/ML  SOLN COMPARISON:  None. FINDINGS: Lower chest: Minimal right pleural effusion. Minimal bibasilar atelectasis. Mild coronary artery calcification. Borderline enlarged heart. Hepatobiliary: Small right  lobe liver cyst. Unremarkable gallbladder. Pancreas: Moderate diffuse pancreatic atrophy. Spleen: Normal in size without focal abnormality. Adrenals/Urinary Tract: Adrenal glands are unremarkable. Kidneys are normal, without renal calculi, focal lesion, or hydronephrosis. Bladder is unremarkable. Stomach/Bowel: Unremarkable stomach, small bowel and colon. No evidence of appendicitis. Vascular/Lymphatic: Atheromatous arterial calcifications without aneurysm. No enlarged lymph nodes. Reproductive: Mild to moderate enlargement of the prostate gland. Other: Bilateral subcutaneous edema at the level of the pelvis and proximal thighs, greater on the left. Small bilateral inguinal hernias containing fat. Musculoskeletal: Hardware fixation of the previously demonstrated intertrochanteric fracture of the left femur with multiple mildly displaced fracture fragments and multiple adjacent locules of air or gas anteromedially in an area of mildly heterogeneous soft tissue density. No discrete fluid collection or rim enhancement seen. There are also some air locules and fluid in the nearby subcutaneous fat laterally with overlying skin clips. IMPRESSION: 1. Hardware fixation of the previously demonstrated intertrochanteric fracture of the left femur with multiple mildly displaced fracture fragments and multiple adjacent locules of air or gas anteromedially in an area of mildly heterogeneous soft tissue density. These most likely represent normal postsurgical changes. Developing infection would need to be excluded clinically. 2. Minimal right pleural effusion. 3. Mild coronary artery atherosclerosis. 4. Mild to moderate prostatic hypertrophy. Electronically Signed   By: Claudie Revering M.D.   On: 03/11/2020 17:02     Not all labs, radiology exams or other studies done during hospitalization come through on my EPIC note; however they are reviewed by me.    Assessment and Plan  Displaced intertrochanteric fracture left  femur-status post ORIF with nail; hemoglobin trended down but no transfusion was required; Xarelto was restarted SNF-admitted for OT/PT; follow-up CBC  Prothrombin gene a factor to see deficiency SNF-life-threatening PE 2016; continue Xarelto 20 mg daily  Dementia SNF-apixaban; continue Aricept 10 mg daily and Namenda 24-hour 20 mg daily  Hyperlipidemia SNF-not stated as uncontrolled; continue Crestor 10 mg daily  BPH SNF-not stated as complicated with obstruction; continue Flomax 0.4 mg daily, Detrol LA 4 mg twice daily   Time spent greater than 45 minutes;> 50% of time with patient was spent reviewing records, labs, tests and studies, counseling and developing plan of care  Hennie Duos, MD

## 2020-03-19 ENCOUNTER — Encounter: Payer: Self-pay | Admitting: Internal Medicine

## 2020-03-22 ENCOUNTER — Non-Acute Institutional Stay (SKILLED_NURSING_FACILITY): Payer: Medicare Other | Admitting: Adult Health

## 2020-03-22 ENCOUNTER — Encounter: Payer: Self-pay | Admitting: Adult Health

## 2020-03-22 DIAGNOSIS — S72142S Displaced intertrochanteric fracture of left femur, sequela: Secondary | ICD-10-CM | POA: Diagnosis not present

## 2020-03-22 DIAGNOSIS — I2692 Saddle embolus of pulmonary artery without acute cor pulmonale: Secondary | ICD-10-CM

## 2020-03-22 DIAGNOSIS — I825Y9 Chronic embolism and thrombosis of unspecified deep veins of unspecified proximal lower extremity: Secondary | ICD-10-CM | POA: Diagnosis not present

## 2020-03-22 DIAGNOSIS — E785 Hyperlipidemia, unspecified: Secondary | ICD-10-CM

## 2020-03-22 DIAGNOSIS — E1169 Type 2 diabetes mellitus with other specified complication: Secondary | ICD-10-CM
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
## 2020-03-23 ENCOUNTER — Encounter: Payer: Self-pay | Admitting: Adult Health

## 2020-03-23 ENCOUNTER — Non-Acute Institutional Stay (SKILLED_NURSING_FACILITY): Payer: Medicare Other | Admitting: Adult Health

## 2020-03-23 DIAGNOSIS — F0390 Unspecified dementia without behavioral disturbance: Secondary | ICD-10-CM | POA: Diagnosis not present

## 2020-03-23 DIAGNOSIS — F329 Major depressive disorder, single episode, unspecified: Secondary | ICD-10-CM | POA: Diagnosis not present

## 2020-03-23 DIAGNOSIS — S72142S Displaced intertrochanteric fracture of left femur, sequela: Secondary | ICD-10-CM | POA: Diagnosis not present

## 2020-03-23 DIAGNOSIS — F0280 Dementia in other diseases classified elsewhere without behavioral disturbance: Secondary | ICD-10-CM | POA: Diagnosis not present

## 2020-03-23 DIAGNOSIS — F0393 Unspecified dementia, unspecified severity, with mood disturbance: Secondary | ICD-10-CM
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
## 2020-03-29 ENCOUNTER — Encounter: Payer: Self-pay | Admitting: Adult Health

## 2020-03-29 ENCOUNTER — Non-Acute Institutional Stay (SKILLED_NURSING_FACILITY): Payer: Medicare Other | Admitting: Adult Health

## 2020-03-29 DIAGNOSIS — I10 Essential (primary) hypertension: Secondary | ICD-10-CM | POA: Diagnosis not present

## 2020-03-29 DIAGNOSIS — E1165 Type 2 diabetes mellitus with hyperglycemia: Secondary | ICD-10-CM | POA: Diagnosis not present

## 2020-03-29 DIAGNOSIS — Z794 Long term (current) use of insulin: Secondary | ICD-10-CM | POA: Diagnosis not present

## 2020-03-29 DIAGNOSIS — S72142S Displaced intertrochanteric fracture of left femur, sequela: Secondary | ICD-10-CM

## 2020-03-29 DIAGNOSIS — IMO0002 Reserved for concepts with insufficient information to code with codable children: Secondary | ICD-10-CM

## 2020-03-29 DIAGNOSIS — E1159 Type 2 diabetes mellitus with other circulatory complications: Secondary | ICD-10-CM

## 2020-03-29 DIAGNOSIS — I152 Hypertension secondary to endocrine disorders: Secondary | ICD-10-CM
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
## 2020-04-04 ENCOUNTER — Other Ambulatory Visit: Payer: Self-pay | Admitting: *Deleted

## 2020-04-04 NOTE — Patient Outreach (Signed)
Screened for potential [facility] Care Management needs as a benefit of  [employer].  Christian [name] is currently receiving skilled therapy at [facility].   Writer attended telephonic interdisciplinary team meeting to assess for disposition needs and transition plan for resident.   Member is from home with wife. Will plan outreach to Christian [name] to discuss potential [facility] needs and transition plans.   [name], MSN-Ed, RN,BSN [place] Acute Care Coordinator [phone] [facility]) [phone]  (Toll free office)

## 2020-04-05 ENCOUNTER — Encounter: Payer: Self-pay | Admitting: Orthopedic Surgery

## 2020-04-05 ENCOUNTER — Ambulatory Visit (INDEPENDENT_AMBULATORY_CARE_PROVIDER_SITE_OTHER): Payer: Medicare Other | Admitting: Orthopedic Surgery

## 2020-04-05 ENCOUNTER — Inpatient Hospital Stay: Payer: Medicare Other

## 2020-04-05 DIAGNOSIS — S72142D Displaced intertrochanteric fracture of left femur, subsequent encounter for closed fracture with routine healing: Secondary | ICD-10-CM

## 2020-04-05 NOTE — Progress Notes (Signed)
Chief Complaint  Patient presents with  . Routine Post Op    hip left 03/05/20    54 male status post gamma nail fixation left inotrope fracture  He has got good hip flexion leg lengths are equal alignment is normal x-ray report as below [facility] [place] imaging pelvis left hip, nail noted  Gamma nail left hip tip to apex distance looks good large greater trochanteric tip fracture  Otherwise fracture fixation intact and fracture reduced.  Encounter Diagnosis  Name Primary?  . Closed displaced intertrochanteric fracture of left femur with routine healing, subsequent encounter s/p fixation 03/05/20 Yes    Follow-up in 2 months x-ray again

## 2020-04-06 ENCOUNTER — Encounter: Payer: Self-pay | Admitting: Adult Health

## 2020-04-06 ENCOUNTER — Other Ambulatory Visit: Payer: Self-pay | Admitting: Adult Health

## 2020-04-06 ENCOUNTER — Non-Acute Institutional Stay (SKILLED_NURSING_FACILITY): Payer: Medicare Other | Admitting: Adult Health

## 2020-04-06 DIAGNOSIS — S72142D Displaced intertrochanteric fracture of left femur, subsequent encounter for closed fracture with routine healing: Secondary | ICD-10-CM | POA: Diagnosis not present

## 2020-04-06 DIAGNOSIS — F0390 Unspecified dementia without behavioral disturbance: Secondary | ICD-10-CM

## 2020-04-06 DIAGNOSIS — I493 Ventricular premature depolarization: Secondary | ICD-10-CM

## 2020-04-06 DIAGNOSIS — I152 Hypertension secondary to endocrine disorders: Secondary | ICD-10-CM

## 2020-04-06 DIAGNOSIS — I10 Essential (primary) hypertension: Secondary | ICD-10-CM

## 2020-04-06 DIAGNOSIS — D6852 Prothrombin gene mutation: Secondary | ICD-10-CM | POA: Diagnosis not present

## 2020-04-06 DIAGNOSIS — E1159 Type 2 diabetes mellitus with other circulatory complications: Secondary | ICD-10-CM | POA: Diagnosis not present

## 2020-04-06 MED ORDER — MEMANTINE HCL ER 28 MG PO CP24: ORAL_CAPSULE | ORAL | 0 refills | Status: AC

## 2020-04-06 MED ORDER — TAMSULOSIN HCL 0.4 MG PO CAPS: ORAL_CAPSULE | ORAL | 0 refills | Status: DC

## 2020-04-06 MED ORDER — DONEPEZIL HCL 10 MG PO TABS: ORAL_TABLET | ORAL | 0 refills | Status: AC

## 2020-04-06 MED ORDER — INSULIN LISPRO (1 UNIT DIAL) 100 UNIT/ML (KWIKPEN): 5.0000 [IU] | PEN_INJECTOR | Freq: Three times a day (TID) | SUBCUTANEOUS | 0 refills | Status: DC

## 2020-04-06 MED ORDER — RIVAROXABAN 20 MG PO TABS: ORAL_TABLET | ORAL | 0 refills | Status: DC

## 2020-04-06 MED ORDER — ROSUVASTATIN CALCIUM 10 MG PO TABS: ORAL_TABLET | ORAL | 0 refills | Status: AC

## 2020-04-06 MED ORDER — FLECAINIDE ACETATE 150 MG PO TABS: ORAL_TABLET | ORAL | 0 refills | Status: DC

## 2020-04-06 MED ORDER — TOLTERODINE TARTRATE ER 4 MG PO CP24: 4.0000 mg | ORAL_CAPSULE | Freq: Every day | ORAL | 0 refills | Status: DC

## 2020-04-06 MED ORDER — FUROSEMIDE 40 MG PO TABS: ORAL_TABLET | ORAL | 0 refills | Status: AC

## 2020-04-06 MED ORDER — METFORMIN HCL 850 MG PO TABS: ORAL_TABLET | ORAL | 0 refills | Status: DC

## 2020-04-06 MED ORDER — FOLIC ACID 1 MG PO TABS: ORAL_TABLET | ORAL | 0 refills | Status: AC

## 2020-04-06 MED ORDER — SERTRALINE HCL 50 MG PO TABS: 50.0000 mg | ORAL_TABLET | Freq: Every day | ORAL | 0 refills | Status: DC

## 2020-04-06 MED ORDER — TRESIBA FLEXTOUCH 100 UNIT/ML ~~LOC~~ SOPN: 46.0000 [IU] | PEN_INJECTOR | Freq: Every day | SUBCUTANEOUS | 0 refills | Status: DC

## 2020-04-06 MED ORDER — DILTIAZEM HCL 30 MG PO TABS: ORAL_TABLET | ORAL | 0 refills | Status: AC
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
## 2020-04-07 ENCOUNTER — Other Ambulatory Visit: Payer: Self-pay | Admitting: *Deleted

## 2020-04-07 DIAGNOSIS — I10 Essential (primary) hypertension: Secondary | ICD-10-CM

## 2020-04-07 NOTE — Patient Outreach (Signed)
[facility] Post- Acute Care Coordinator follow up.  Received return telephone call from Javale Athans (wife/DPR) [phone]. Patient identifiers confirmed. [name] reports member will come home from [facility] on tomorrow, May 15th.   [name] states she is going to be the primary caregiver for member. States she is unable to afford paid caregiver services and there is no one else who can assist. States she has equipment in the home from her now deceased father.  Mrs. Westfall understands CNA with [facility] will but periodic but looks forward to the assistance. States it would be more than what she has. She is waiting anxiously to hear from home health agency to schedule visit. Discussed that writer will follow up with facility SW to relay her concerns.   Explained [facility] services. Mrs. Hennen is agreeable. Explained [facility] Care Management will not interfere or replace home health. Discussed [facility] Care Management will be telephonic.  Confirmed PCP is Chana Bode, PA- C. Confirmed best contact number to reach Mrs. Wedig is [phone].  Mrs. Groome goes on to say that member is hard of hearing and does not speak much. Mrs. Leon reports "he gave up when he received dementia diagnosis." Mrs. Santucci is agreeable to [facility] outpatient palliative care referral for ongoing goals of care discussions.   Provided Mrs. Nunley with [facility] Care Management contact information, Dentist information, 24-hr nurse advice line telephone number, and [facility] telephone number in case she does not hear from hhc agency this weekend.   Sent communication to facility SW to make aware that member's wife wants to speak with [facility] to discuss when they are coming to the home.   Will make referral to [place]. Mrs. Benak has medical history of dementia, DM, HTN, PE, DVT secondary to prothrombin gene.   Made referral to [facility] outpatient palliative.    [name], MSN-Ed,  RN,BSN [place] Acute Care Coordinator [phone] [facility]) [phone]  (Toll free office)

## 2020-04-07 NOTE — Patient Outreach (Signed)
[facility] Post- Acute Care Coordinator follow up.  [name] is currently at [facility]. Appears he will transition to home tomorrow.   Telephone call made to Christian [name] [phone]. No answer. HIPAA compliant voicemail message left requesting return call.   Will try back again later to discuss [facility] services.   Will communicate with facility SW to inquire about home health arrangements.   [name], MSN-Ed, RN,BSN [place] Acute Care Coordinator [phone] [facility]) [phone]  (Toll free office)

## 2020-04-10 ENCOUNTER — Telehealth: Payer: Self-pay

## 2020-04-10 NOTE — Telephone Encounter (Signed)
Received request for Palliative care to follow patient at home. Message sent to PCP.

## 2020-04-11 ENCOUNTER — Telehealth: Payer: Self-pay | Admitting: Orthopedic Surgery

## 2020-04-11 NOTE — Telephone Encounter (Signed)
Patient's wife Christian [name] called and left a message stating that her husband Christian [name] has fallen about 3 times.  She is concerned about his hip.  I have tried to call her back 2 times on all the phone numbers that we have listed.  I have left a message on the mobile number but was unable to do so on the house phone.

## 2020-04-12 ENCOUNTER — Telehealth: Payer: Self-pay

## 2020-04-12 ENCOUNTER — Other Ambulatory Visit: Payer: Self-pay

## 2020-04-12 ENCOUNTER — Other Ambulatory Visit: Payer: Self-pay | Admitting: *Deleted

## 2020-04-12 ENCOUNTER — Emergency Department (HOSPITAL_COMMUNITY): Payer: Medicare Other

## 2020-04-12 ENCOUNTER — Encounter (HOSPITAL_COMMUNITY): Payer: Self-pay

## 2020-04-12 ENCOUNTER — Inpatient Hospital Stay (HOSPITAL_COMMUNITY)
Admission: EM | Admit: 2020-04-12 | Discharge: 2020-04-14 | DRG: 071 | Disposition: A | Discharge status: Skilled Nursing Facility | Payer: Medicare Other | Attending: Internal Medicine | Admitting: Internal Medicine

## 2020-04-12 DIAGNOSIS — R296 Repeated falls: Secondary | ICD-10-CM

## 2020-04-12 DIAGNOSIS — Z87891 Personal history of nicotine dependence: Secondary | ICD-10-CM

## 2020-04-12 DIAGNOSIS — E785 Hyperlipidemia, unspecified: Secondary | ICD-10-CM | POA: Diagnosis present

## 2020-04-12 DIAGNOSIS — R3915 Urgency of urination: Secondary | ICD-10-CM | POA: Diagnosis not present

## 2020-04-12 DIAGNOSIS — E1169 Type 2 diabetes mellitus with other specified complication: Secondary | ICD-10-CM | POA: Diagnosis not present

## 2020-04-12 DIAGNOSIS — M170 Bilateral primary osteoarthritis of knee: Secondary | ICD-10-CM | POA: Diagnosis not present

## 2020-04-12 DIAGNOSIS — F339 Major depressive disorder, recurrent, unspecified: Secondary | ICD-10-CM | POA: Diagnosis not present

## 2020-04-12 DIAGNOSIS — W19XXXA Unspecified fall, initial encounter: Secondary | ICD-10-CM | POA: Diagnosis present

## 2020-04-12 DIAGNOSIS — Z20822 Contact with and (suspected) exposure to covid-19: Secondary | ICD-10-CM | POA: Diagnosis not present

## 2020-04-12 DIAGNOSIS — I10 Essential (primary) hypertension: Secondary | ICD-10-CM | POA: Diagnosis present

## 2020-04-12 DIAGNOSIS — Z66 Do not resuscitate: Secondary | ICD-10-CM | POA: Diagnosis present

## 2020-04-12 DIAGNOSIS — D6852 Prothrombin gene mutation: Secondary | ICD-10-CM | POA: Diagnosis present

## 2020-04-12 DIAGNOSIS — E1141 Type 2 diabetes mellitus with diabetic mononeuropathy: Secondary | ICD-10-CM | POA: Diagnosis not present

## 2020-04-12 DIAGNOSIS — D649 Anemia, unspecified: Secondary | ICD-10-CM | POA: Diagnosis not present

## 2020-04-12 DIAGNOSIS — Z794 Long term (current) use of insulin: Secondary | ICD-10-CM

## 2020-04-12 DIAGNOSIS — F329 Major depressive disorder, single episode, unspecified: Secondary | ICD-10-CM | POA: Diagnosis present

## 2020-04-12 DIAGNOSIS — G9341 Metabolic encephalopathy: Principal | ICD-10-CM | POA: Diagnosis present

## 2020-04-12 DIAGNOSIS — F419 Anxiety disorder, unspecified: Secondary | ICD-10-CM | POA: Diagnosis present

## 2020-04-12 DIAGNOSIS — G319 Degenerative disease of nervous system, unspecified: Secondary | ICD-10-CM | POA: Diagnosis not present

## 2020-04-12 DIAGNOSIS — M47812 Spondylosis without myelopathy or radiculopathy, cervical region: Secondary | ICD-10-CM | POA: Diagnosis not present

## 2020-04-12 DIAGNOSIS — M1712 Unilateral primary osteoarthritis, left knee: Secondary | ICD-10-CM | POA: Diagnosis present

## 2020-04-12 DIAGNOSIS — I482 Chronic atrial fibrillation, unspecified: Secondary | ICD-10-CM | POA: Diagnosis present

## 2020-04-12 DIAGNOSIS — N39498 Other specified urinary incontinence: Secondary | ICD-10-CM | POA: Diagnosis not present

## 2020-04-12 DIAGNOSIS — M2042 Other hammer toe(s) (acquired), left foot: Secondary | ICD-10-CM | POA: Diagnosis not present

## 2020-04-12 DIAGNOSIS — Z86718 Personal history of other venous thrombosis and embolism: Secondary | ICD-10-CM

## 2020-04-12 DIAGNOSIS — F840 Autistic disorder: Secondary | ICD-10-CM | POA: Diagnosis present

## 2020-04-12 DIAGNOSIS — E7800 Pure hypercholesterolemia, unspecified: Secondary | ICD-10-CM | POA: Diagnosis present

## 2020-04-12 DIAGNOSIS — Z79899 Other long term (current) drug therapy: Secondary | ICD-10-CM

## 2020-04-12 DIAGNOSIS — M25461 Effusion, right knee: Secondary | ICD-10-CM | POA: Diagnosis present

## 2020-04-12 DIAGNOSIS — R269 Unspecified abnormalities of gait and mobility: Secondary | ICD-10-CM | POA: Diagnosis present

## 2020-04-12 DIAGNOSIS — R251 Tremor, unspecified: Secondary | ICD-10-CM | POA: Diagnosis present

## 2020-04-12 DIAGNOSIS — M549 Dorsalgia, unspecified: Secondary | ICD-10-CM | POA: Diagnosis present

## 2020-04-12 DIAGNOSIS — H9190 Unspecified hearing loss, unspecified ear: Secondary | ICD-10-CM | POA: Diagnosis present

## 2020-04-12 DIAGNOSIS — R609 Edema, unspecified: Secondary | ICD-10-CM | POA: Diagnosis not present

## 2020-04-12 DIAGNOSIS — S72142D Displaced intertrochanteric fracture of left femur, subsequent encounter for closed fracture with routine healing: Secondary | ICD-10-CM | POA: Diagnosis not present

## 2020-04-12 DIAGNOSIS — F0281 Dementia in other diseases classified elsewhere with behavioral disturbance: Secondary | ICD-10-CM | POA: Diagnosis not present

## 2020-04-12 DIAGNOSIS — Z888 Allergy status to other drugs, medicaments and biological substances status: Secondary | ICD-10-CM

## 2020-04-12 DIAGNOSIS — G8929 Other chronic pain: Secondary | ICD-10-CM | POA: Diagnosis present

## 2020-04-12 DIAGNOSIS — N401 Enlarged prostate with lower urinary tract symptoms: Secondary | ICD-10-CM | POA: Diagnosis not present

## 2020-04-12 DIAGNOSIS — R52 Pain, unspecified: Secondary | ICD-10-CM | POA: Diagnosis not present

## 2020-04-12 DIAGNOSIS — R262 Difficulty in walking, not elsewhere classified: Secondary | ICD-10-CM | POA: Diagnosis present

## 2020-04-12 DIAGNOSIS — I2510 Atherosclerotic heart disease of native coronary artery without angina pectoris: Secondary | ICD-10-CM | POA: Diagnosis not present

## 2020-04-12 DIAGNOSIS — Z885 Allergy status to narcotic agent status: Secondary | ICD-10-CM

## 2020-04-12 DIAGNOSIS — R531 Weakness: Secondary | ICD-10-CM | POA: Diagnosis not present

## 2020-04-12 DIAGNOSIS — M4312 Spondylolisthesis, cervical region: Secondary | ICD-10-CM | POA: Diagnosis not present

## 2020-04-12 DIAGNOSIS — Z7901 Long term (current) use of anticoagulants: Secondary | ICD-10-CM

## 2020-04-12 DIAGNOSIS — E1165 Type 2 diabetes mellitus with hyperglycemia: Secondary | ICD-10-CM | POA: Diagnosis present

## 2020-04-12 DIAGNOSIS — H6123 Impacted cerumen, bilateral: Secondary | ICD-10-CM | POA: Diagnosis not present

## 2020-04-12 DIAGNOSIS — Z8249 Family history of ischemic heart disease and other diseases of the circulatory system: Secondary | ICD-10-CM

## 2020-04-12 DIAGNOSIS — S80212A Abrasion, left knee, initial encounter: Secondary | ICD-10-CM | POA: Diagnosis present

## 2020-04-12 DIAGNOSIS — M48061 Spinal stenosis, lumbar region without neurogenic claudication: Secondary | ICD-10-CM | POA: Diagnosis not present

## 2020-04-12 DIAGNOSIS — Z806 Family history of leukemia: Secondary | ICD-10-CM

## 2020-04-12 DIAGNOSIS — G4733 Obstructive sleep apnea (adult) (pediatric): Secondary | ICD-10-CM | POA: Diagnosis not present

## 2020-04-12 DIAGNOSIS — H9313 Tinnitus, bilateral: Secondary | ICD-10-CM | POA: Diagnosis not present

## 2020-04-12 DIAGNOSIS — I959 Hypotension, unspecified: Secondary | ICD-10-CM | POA: Diagnosis not present

## 2020-04-12 DIAGNOSIS — R2689 Other abnormalities of gait and mobility: Secondary | ICD-10-CM | POA: Diagnosis not present

## 2020-04-12 DIAGNOSIS — Z832 Family history of diseases of the blood and blood-forming organs and certain disorders involving the immune mechanism: Secondary | ICD-10-CM

## 2020-04-12 DIAGNOSIS — I493 Ventricular premature depolarization: Secondary | ICD-10-CM | POA: Diagnosis not present

## 2020-04-12 DIAGNOSIS — H9193 Unspecified hearing loss, bilateral: Secondary | ICD-10-CM | POA: Diagnosis not present

## 2020-04-12 DIAGNOSIS — Y92129 Unspecified place in nursing home as the place of occurrence of the external cause: Secondary | ICD-10-CM

## 2020-04-12 DIAGNOSIS — F0390 Unspecified dementia without behavioral disturbance: Secondary | ICD-10-CM | POA: Diagnosis present

## 2020-04-12 DIAGNOSIS — Z86711 Personal history of pulmonary embolism: Secondary | ICD-10-CM

## 2020-04-12 DIAGNOSIS — R413 Other amnesia: Secondary | ICD-10-CM | POA: Diagnosis present

## 2020-04-12 LAB — COMPREHENSIVE METABOLIC PANEL
ALT: 19 U/L (ref 0–44)
AST: 20 U/L (ref 15–41)
Albumin: 3.2 g/dL — ABNORMAL LOW (ref 3.5–5.0)
Alkaline Phosphatase: 153 U/L — ABNORMAL HIGH (ref 38–126)
Anion gap: 10 (ref 5–15)
BUN: 16 mg/dL (ref 8–23)
CO2: 24 mmol/L (ref 22–32)
Calcium: 9 mg/dL (ref 8.9–10.3)
Chloride: 104 mmol/L (ref 98–111)
Creatinine, Ser: 0.61 mg/dL (ref 0.61–1.24)
GFR calc Af Amer: >60 mL/min (ref >60)
GFR calc non Af Amer: >60 mL/min (ref >60)
Glucose, Bld: 191 mg/dL — ABNORMAL HIGH (ref 70–99)
Potassium: 4.5 mmol/L (ref 3.5–5.1)
Sodium: 138 mmol/L (ref 135–145)
Total Bilirubin: 0.5 mg/dL (ref 0.3–1.2)
Total Protein: 7.1 g/dL (ref 6.5–8.1)

## 2020-04-12 LAB — CBC WITH DIFFERENTIAL/PLATELET
Abs Immature Granulocytes: 0.02 10*3/uL (ref 0.00–0.07)
Basophils Absolute: 0.1 10*3/uL (ref 0.0–0.1)
Basophils Relative: 1 %
Eosinophils Absolute: 0.2 10*3/uL (ref 0.0–0.5)
Eosinophils Relative: 2 %
HCT: 38.4 % — ABNORMAL LOW (ref 39.0–52.0)
Hemoglobin: 11.5 g/dL — ABNORMAL LOW (ref 13.0–17.0)
Immature Granulocytes: 0 %
Lymphocytes Relative: 10 %
Lymphs Abs: 0.9 10*3/uL (ref 0.7–4.0)
MCH: 29.4 pg (ref 26.0–34.0)
MCHC: 29.9 g/dL — ABNORMAL LOW (ref 30.0–36.0)
MCV: 98.2 fL (ref 80.0–100.0)
Monocytes Absolute: 0.9 10*3/uL (ref 0.1–1.0)
Monocytes Relative: 10 %
Neutro Abs: 6.8 10*3/uL (ref 1.7–7.7)
Neutrophils Relative %: 77 %
Platelets: 231 10*3/uL (ref 150–400)
RBC: 3.91 MIL/uL — ABNORMAL LOW (ref 4.22–5.81)
RDW: 13.2 % (ref 11.5–15.5)
WBC: 8.8 10*3/uL (ref 4.0–10.5)
nRBC: 0 % (ref 0.0–0.2)

## 2020-04-12 LAB — URINALYSIS, ROUTINE W REFLEX MICROSCOPIC
Bacteria, UA: NONE SEEN
Bilirubin Urine: NEGATIVE
Glucose, UA: >=500 mg/dL — AB
Hgb urine dipstick: NEGATIVE
Ketones, ur: NEGATIVE mg/dL
Leukocytes,Ua: NEGATIVE
Nitrite: NEGATIVE
Protein, ur: NEGATIVE mg/dL
Specific Gravity, Urine: 1.028 (ref 1.005–1.030)
pH: 5 (ref 5.0–8.0)

## 2020-04-12 LAB — SARS CORONAVIRUS 2 BY RT PCR (HOSPITAL ORDER, PERFORMED IN ~~LOC~~ HOSPITAL LAB): SARS Coronavirus 2: NEGATIVE

## 2020-04-12 LAB — GLUCOSE, CAPILLARY: Glucose-Capillary: 130 mg/dL — ABNORMAL HIGH (ref 70–99)

## 2020-04-12 MED ORDER — DONEPEZIL HCL 5 MG PO TABS
10.0000 mg | ORAL_TABLET | Freq: Every day | ORAL | Status: DC
  Administered 2020-04-12 – 2020-04-13 (×2): 10 mg via ORAL
  Filled 2020-04-12 (×2): qty 1
  Filled 2020-04-12 (×2): qty 2

## 2020-04-12 MED ORDER — RIVAROXABAN 20 MG PO TABS
20.0000 mg | ORAL_TABLET | Freq: Every day | ORAL | Status: DC
  Administered 2020-04-12 – 2020-04-13 (×2): 20 mg via ORAL
  Filled 2020-04-12 (×4): qty 2

## 2020-04-12 MED ORDER — ONDANSETRON HCL 4 MG PO TABS: 4.0000 mg | ORAL_TABLET | Freq: Four times a day (QID) | ORAL | Status: DC | PRN

## 2020-04-12 MED ORDER — INSULIN ASPART 100 UNIT/ML ~~LOC~~ SOLN
0.0000 [IU] | Freq: Every day | SUBCUTANEOUS | Status: DC
  Administered 2020-04-13: 3 [IU] via SUBCUTANEOUS

## 2020-04-12 MED ORDER — MEMANTINE HCL ER 28 MG PO CP24
28.0000 mg | ORAL_CAPSULE | Freq: Every morning | ORAL | Status: DC
  Administered 2020-04-13 – 2020-04-14 (×2): 28 mg via ORAL
  Filled 2020-04-12 (×7): qty 1

## 2020-04-12 MED ORDER — NAPROXEN SODIUM 220 MG PO TABS: 220.0000 mg | ORAL_TABLET | Freq: Every day | ORAL | Status: DC | PRN

## 2020-04-12 MED ORDER — ONDANSETRON HCL 4 MG/2ML IJ SOLN: 4.0000 mg | Freq: Four times a day (QID) | INTRAMUSCULAR | Status: DC | PRN

## 2020-04-12 MED ORDER — POLYETHYLENE GLYCOL 3350 17 G PO PACK
17.0000 g | PACK | Freq: Two times a day (BID) | ORAL | Status: DC
  Administered 2020-04-12 – 2020-04-14 (×4): 17 g via ORAL
  Filled 2020-04-12 (×4): qty 1

## 2020-04-12 MED ORDER — FESOTERODINE FUMARATE ER 4 MG PO TB24: 4.0000 mg | ORAL_TABLET | Freq: Every day | ORAL | Status: DC

## 2020-04-12 MED ORDER — INSULIN ASPART 100 UNIT/ML ~~LOC~~ SOLN
0.0000 [IU] | Freq: Three times a day (TID) | SUBCUTANEOUS | Status: DC
  Administered 2020-04-13: 2 [IU] via SUBCUTANEOUS
  Administered 2020-04-13 – 2020-04-14 (×3): 3 [IU] via SUBCUTANEOUS
  Administered 2020-04-14: 7 [IU] via SUBCUTANEOUS

## 2020-04-12 MED ORDER — FOLIC ACID 1 MG PO TABS
1.0000 mg | ORAL_TABLET | Freq: Every morning | ORAL | Status: DC
  Administered 2020-04-13 – 2020-04-14 (×2): 1 mg via ORAL
  Filled 2020-04-12 (×2): qty 1

## 2020-04-12 MED ORDER — ROSUVASTATIN CALCIUM 10 MG PO TABS
10.0000 mg | ORAL_TABLET | Freq: Every day | ORAL | Status: DC
  Administered 2020-04-12 – 2020-04-13 (×2): 10 mg via ORAL
  Filled 2020-04-12 (×4): qty 1

## 2020-04-12 MED ORDER — FLECAINIDE ACETATE 50 MG PO TABS
75.0000 mg | ORAL_TABLET | Freq: Two times a day (BID) | ORAL | Status: DC
  Administered 2020-04-12 – 2020-04-14 (×4): 75 mg via ORAL
  Filled 2020-04-12 (×8): qty 2

## 2020-04-12 MED ORDER — TAMSULOSIN HCL 0.4 MG PO CAPS
0.4000 mg | ORAL_CAPSULE | Freq: Every morning | ORAL | Status: DC
  Administered 2020-04-13 – 2020-04-14 (×2): 0.4 mg via ORAL
  Filled 2020-04-12 (×2): qty 1

## 2020-04-12 MED ORDER — SERTRALINE HCL 50 MG PO TABS
50.0000 mg | ORAL_TABLET | Freq: Every morning | ORAL | Status: DC
  Administered 2020-04-13: 50 mg via ORAL
  Filled 2020-04-12: qty 1

## 2020-04-12 MED ORDER — NAPROXEN 250 MG PO TABS: 250.0000 mg | ORAL_TABLET | Freq: Every day | ORAL | Status: DC | PRN

## 2020-04-12 MED ORDER — FUROSEMIDE 40 MG PO TABS: 40.0000 mg | ORAL_TABLET | Freq: Every day | ORAL | Status: DC | PRN

## 2020-04-12 MED ORDER — FESOTERODINE FUMARATE ER 4 MG PO TB24
8.0000 mg | ORAL_TABLET | Freq: Every day | ORAL | Status: DC
  Administered 2020-04-12 – 2020-04-14 (×3): 8 mg via ORAL
  Filled 2020-04-12 (×2): qty 1
  Filled 2020-04-12 (×3): qty 2
  Filled 2020-04-12: qty 1

## 2020-04-12 MED ORDER — DILTIAZEM HCL 30 MG PO TABS
30.0000 mg | ORAL_TABLET | Freq: Two times a day (BID) | ORAL | Status: DC
  Administered 2020-04-12 – 2020-04-14 (×4): 30 mg via ORAL
  Filled 2020-04-12 (×4): qty 1

## 2020-04-12 MED ORDER — ACETAMINOPHEN 650 MG RE SUPP: 650.0000 mg | Freq: Four times a day (QID) | RECTAL | Status: DC | PRN

## 2020-04-12 MED ORDER — ACETAMINOPHEN 325 MG PO TABS
650.0000 mg | ORAL_TABLET | Freq: Once | ORAL | Status: AC
  Administered 2020-04-12: 650 mg via ORAL
  Filled 2020-04-12: qty 2

## 2020-04-12 MED ORDER — ACETAMINOPHEN 325 MG PO TABS
650.0000 mg | ORAL_TABLET | Freq: Four times a day (QID) | ORAL | Status: DC | PRN
  Administered 2020-04-13 – 2020-04-14 (×2): 650 mg via ORAL
  Filled 2020-04-12 (×2): qty 2

## 2020-04-12 NOTE — ED Notes (Signed)
Pt in bed, pt reports some pain to his L knee, pt has some abrasions to his L knee, pt is generally weak in lower extremities, pt doesn't know the day of the week, states that he is retired and the day of the week doesn't matter much.  Reoriented pt.  Pt awaits radiology.

## 2020-04-12 NOTE — ED Notes (Signed)
Pt to MRI

## 2020-04-12 NOTE — ED Triage Notes (Signed)
Pt brought to ED via [facility] for multiple falls. Pt was discharged from nursing facility Saturday. Pt has fallen 3 times since, states he gets weak and falls to the floor. Pt denies LOC or hitting head. Pt c/o bilateral knee pain.

## 2020-04-12 NOTE — ED Notes (Signed)
Pt refused standing bp and ambulation

## 2020-04-12 NOTE — ED Provider Notes (Signed)
[facility] EMERGENCY DEPARTMENT Provider Note   CSN: [id number] Arrival date & time: 04/12/20  1520     History Chief Complaint  Patient presents with  . Fall    Christian [name] is a 79 y.o. male.  HPI   Pt is a 79 y/o male with a h/o chronic back pain, DM, DVT/PE, HLD, HTN, leg pain, mild cognitive impairment, OSA on CPAP, who presents to the ED today for eval of mult falls.   Patient has baseline dementia therefore there is a level 5 caveat as history is somewhat limited however he is able to answer questions but he does not remember his falls.  He is complaining of some neck pain, bilateral knee pain, pain to the left hip.  Denies any chest pain, shortness of breath, cough.  No headaches.  Denies any abdominal pain.  3:54 PM  obtain additional history from the patient's wife who states that patient was just discharged from [place] home 4 days ago.  States that since he has been home he has been generally weak and unable to stand or complete any of his ADLs without significant assistance.  States she has a wheelchair and walker at home but she is not sure what his functional status was upon discharge and is not sure what he should be using for ambulation.  She states that he has a history of dementia and autism and does have some baseline confusion but she feels like he is worse since he has been discharged home.  She does not feel like he is at his baseline currently.  She states that home health is currently being organized for the patient and [facility] was out to see the patient in the home today prior to his arrival in the ED.  4:05 PM Spoke with nursing staff from [facility] about patients ability to complete ADLs.  They state that while he was at the facility he was able to ambulate with a walker.  In regards to his mental status they state that he does have baseline confusion but his mental status was improving upon discharge.  They also stated that  they have organized patient to be set up for home health.    Past Medical History:  Diagnosis Date  . Agitation   . Arthritis   . Cataract   . Chronic back pain   . Confusion   . Diabetes ([place])    type 2  . DVT (deep venous thrombosis) ([place]) 01/2015  . Hearing impaired   . High cholesterol   . Hypertension   . Insomnia   . Insomnia   . Leg pain, diffuse   . Leg swelling   . Memory loss   . Mild cognitive impairment    sees [facility]  . Numbness    fingers, feet, toes  . OSA on CPAP   . 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 06/14/2015  . Pulmonary embolism ([place]) 01/2015  . Spinal stenosis    lumbar  . Tremor    on propranolol  . Wears glasses     Patient Active Problem List   Diagnosis Date Noted  . Ambulatory dysfunction 04/12/2020  . Hyperlipidemia associated with type 2 diabetes mellitus ([place]) 03/14/2020  . Acute blood loss anemia 03/14/2020  . Chronic constipation 03/14/2020  . Depression due to dementia ([place]) 03/10/2020  . Displaced intertrochanteric fracture of left femur ([place])   . S/p left hip  fracture 03/03/2020  . Benign prostatic hyperplasia with lower urinary tract symptoms 06/04/2019  . Urge incontinence 06/04/2019  . History of stroke 06/02/2019  . Medicare annual wellness visit, subsequent 06/02/2019  . Urinary incontinence 10/28/2018  . Polyuria 10/28/2018  . Dermatitis 10/28/2018  . Tinea cruris 03/25/2018  . Tick bite 03/25/2018  . Balanitis 03/25/2018  . At moderate risk for fall 12/31/2017  . Alcohol abuse 12/29/2017  . History of pulmonary embolism 12/29/2017  . Depression, recurrent ([place]) 12/29/2017  . Alcohol intoxication ([place]) 12/16/2017  . Wears hearing aid 06/09/2017  . Tinnitus of both ears 06/09/2017  . Bilateral impacted cerumen 06/09/2017  . Left wrist pain 06/26/2016  . Morning joint stiffness 06/26/2016  . Polyarthralgia 06/26/2016  . Insulin dependent type 2 diabetes  mellitus, uncontrolled ([place]) 04/02/2016  . Gait disturbance 04/02/2016  . Physical deconditioning 04/02/2016  . Pain of both shoulder joints 04/02/2016  . Leg weakness, bilateral 04/02/2016  . Encounter for therapeutic drug monitoring 11/29/2015  . Encounter for health maintenance examination in adult 10/30/2015  . Shoulder pain, bilateral 10/30/2015  . Muscle weakness 10/30/2015  . Generalized weakness 10/30/2015  . Rash 10/30/2015  . PVC (premature ventricular contraction) 10/10/2015  . Decreased energy 06/20/2015  . Chronic pulmonary embolism ([place]) 06/20/2015  . DVT (deep venous thrombosis) ([place]) 06/20/2015  . Hammer toe of left foot 06/20/2015  . Pre-ulcerative calluses 06/20/2015  . Hypertrophic toenail 06/20/2015  . Diabetic mononeuropathy associated with diabetes mellitus due to underlying condition ([place]) 06/20/2015  . Leg pain, bilateral 06/20/2015  . Chronic low back pain 06/20/2015  . 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 06/14/2015  . OSA on CPAP 03/06/2015  . Edema 03/06/2015  . Leg pain, diffuse 03/06/2015  . Bilateral low back pain without sciatica 03/06/2015  . Tremor 03/06/2015  . Insomnia 03/06/2015  . Vaccine counseling 03/06/2015  . Chronic back pain 03/06/2015  . Wears glasses 03/06/2015  . Hearing loss 03/06/2015  . Spinal stenosis of lumbar region 03/06/2015  . Ptosis 03/06/2015  . Positive depression screening 03/06/2015  . Advance directive discussed with patient 03/06/2015  . Bradycardia 02/27/2015  . Current use of long term anticoagulation   . Dementia without behavioral disturbance ([place])   . Hypertension associated with type 2 diabetes mellitus ([place]) 02/08/2015  . Hyperlipidemia 02/08/2015  . OSA (obstructive sleep apnea) 02/08/2015  . Memory loss 03/12/2013  . Diabetes mellitus ([place]) 03/12/2013    Past Surgical History:  Procedure Laterality Date  . CATARACT EXTRACTION    . COLONOSCOPY   2014  . INTRAMEDULLARY (IM) NAIL INTERTROCHANTERIC Left 03/05/2020   Procedure: INTRAMEDULLARY (IM) NAIL INTERTROCHANTRIC;  Surgeon: Carole Civil, MD;  Location: [facility];  Service: Orthopedics;  Laterality: Left;  . TONSILLECTOMY         Family History  Problem Relation Age of Onset  . Dementia Father   . Clotting disorder Mother   . Heart disease Brother   . Clotting disorder Brother   . Psychiatric Illness Sister   . Leukemia Sister     Social History   Tobacco Use  . Smoking status: Former Smoker    Packs/day: 1.00    Years: 10.00    Pack years: 10.00    Types: Cigarettes    Quit date: 11/25/2004    Years since quitting: 15.3  . Smokeless tobacco: Never Used  Substance Use Topics  . Alcohol use: Yes    Alcohol/week: 1.0 standard drinks  Types: 1 Glasses of wine per week    Comment: once a month per pt   . Drug use: Not Currently    Home Medications Prior to Admission medications   Medication Sig Start Date End Date Taking? Authorizing Provider  acetaminophen (TYLENOL) 325 MG tablet Take 650 mg by mouth every 6 (six) hours as needed for mild pain or moderate pain.   Yes [provider]  diltiazem (CARDIZEM) 30 MG tablet TAKE 1 TABLET(30 MG) BY MOUTH TWICE DAILY Patient taking differently: Take 30 mg by mouth 2 (two) times daily.  04/06/20  Yes Gerlene Fee, NP  donepezil (ARICEPT) 10 MG tablet Take 1 tablet (10 mg) by mouth at bedtime. 04/06/20  Yes Gerlene Fee, NP  flecainide (TAMBOCOR) 150 MG tablet TAKE 1/2 TABLET(75 MG) BY MOUTH TWICE DAILY Patient taking differently: Take 75 mg by mouth 2 (two) times daily.  04/06/20  Yes Gerlene Fee, NP  folic acid (FOLVITE) 1 MG tablet Take 1 mg daily Patient taking differently: Take 1 mg by mouth in the morning.  04/06/20  Yes Gerlene Fee, NP  furosemide (LASIX) 40 MG tablet Take 40 mg daily AS NEEDED  For swelling. Patient taking differently: Take 40 mg by mouth daily as needed for fluid.   04/06/20  Yes Gerlene Fee, NP  insulin degludec (TRESIBA FLEXTOUCH) 100 UNIT/ML FlexTouch Pen Inject 0.46 mLs (46 Units total) into the skin daily. Patient taking differently: Inject 41 Units into the skin at bedtime.  04/06/20  Yes Gerlene Fee, NP  memantine (NAMENDA XR) 28 MG CP24 24 hr capsule TAKE 1 CAPSULE(28 MG) BY MOUTH DAILY Patient taking differently: Take 28 mg by mouth in the morning.  04/06/20  Yes Gerlene Fee, NP  metFORMIN (GLUCOPHAGE) 850 MG tablet TAKE 1 TABLET(850 MG) BY MOUTH TWICE DAILY WITH A MEAL Patient taking differently: Take 850 mg by mouth 2 (two) times daily with a meal.  04/06/20  Yes Gerlene Fee, NP  Misc Natural Products (OSTEO BI-FLEX ADV JOINT SHIELD PO) Take 2 tablets by mouth daily.    Yes [provider]  naproxen sodium (ALEVE) 220 MG tablet Take 220 mg by mouth daily as needed (for pain).    Yes [provider]  polyethylene glycol (MIRALAX / GLYCOLAX) 17 g packet Take 17 g by mouth 2 (two) times daily. 03/10/20  Yes [provider]  rivaroxaban (XARELTO) 20 MG TABS tablet TAKE 1 TABLET BY MOUTH EVERY DAY WITH SUPPER Patient taking differently: Take 20 mg by mouth daily with supper.  04/06/20  Yes Gerlene Fee, NP  rosuvastatin (CRESTOR) 10 MG tablet TAKE 1 TABLET(10 MG) BY MOUTH AT BEDTIME Patient taking differently: Take 10 mg by mouth at bedtime.  04/06/20  Yes Gerlene Fee, NP  sertraline (ZOLOFT) 50 MG tablet Take 1 tablet (50 mg total) by mouth daily. Patient taking differently: Take 50 mg by mouth in the morning.  04/06/20  Yes Gerlene Fee, NP  tamsulosin (FLOMAX) 0.4 MG CAPS capsule TAKE 1 CAPSULE(0.4 MG) BY MOUTH DAILY Patient taking differently: Take 0.4 mg by mouth in the morning.  04/06/20  Yes Gerlene Fee, NP  tolterodine (DETROL LA) 4 MG 24 hr capsule Take 1 capsule (4 mg total) by mouth at bedtime. 04/06/20  Yes Gerlene Fee, NP  docusate sodium (COLACE) 100 MG capsule Take 1 capsule (100  mg total) by mouth 2 (two) times daily. Patient not taking: Reported on 04/12/2020  03/07/20   Manuella Ghazi, Pratik D, DO  insulin lispro (HUMALOG KWIKPEN) 100 UNIT/ML KwikPen Inject 0.05 mLs (5 Units total) into the skin 3 (three) times daily with meals. For cbg > 150 Patient not taking: Reported on 04/12/2020 04/06/20   Gerlene Fee, NP    Allergies    Bee venom, Ultram [tramadol hcl], Oxycodone, and Oxycontin [oxycodone hcl]  Review of Systems   Review of Systems  Constitutional: Negative for fever.  HENT: Negative for ear pain and sore throat.   Eyes: Negative for visual disturbance.  Respiratory: Negative for cough and shortness of breath.   Cardiovascular: Negative for chest pain.  Gastrointestinal: Negative for abdominal pain, constipation, diarrhea, nausea and vomiting.  Genitourinary: Negative for dysuria and hematuria.  Musculoskeletal: Positive for gait problem and neck pain.       Left hip pain, bilat knee pain  Skin: Negative for color change and rash.  Neurological: Positive for weakness (generally weak per wife). Negative for dizziness, light-headedness, numbness and headaches.  All other systems reviewed and are negative.   Physical Exam Updated Vital Signs BP 139/75 (BP Location: Left Arm)   Pulse 76   Temp 97.7 F (36.5 C) (Oral)   Resp 17   Ht 5\' 11"  (1.803 m)   Wt 108.3 kg   SpO2 96%   BMI 33.31 kg/m   Physical Exam Vitals and nursing note reviewed.  Constitutional:      Appearance: He is well-developed.  HENT:     Head: Normocephalic and atraumatic.  Eyes:     Conjunctiva/sclera: Conjunctivae normal.  Cardiovascular:     Rate and Rhythm: Normal rate and regular rhythm.     Heart sounds: No murmur.  Pulmonary:     Effort: Pulmonary effort is normal. No respiratory distress.     Breath sounds: Normal breath sounds. No wheezing, rhonchi or rales.  Abdominal:     Palpations: Abdomen is soft.     Tenderness: There is no abdominal tenderness.    Musculoskeletal:     Cervical back: Neck supple.     Comments: C-spine TTP. No thoracic or lumbar TTP. Well healing incision noted to the left hip. TTP over the left hip and pain with passive ROM of the hip. TTP to the bilat knees. Superficial abrasions to the left knee.   Skin:    General: Skin is warm and dry.  Neurological:     Mental Status: He is alert.     Comments: Mental Status:  Alert, pleasantly demented. Oriented to self, place but not date or situation.  Speech fluent without evidence of aphasia. Able to follow 2 step commands without difficulty.  Cranial Nerves:  II:  pupils equal, round, reactive to light III,IV, VI: right eye ptosis, extra-ocular motions intact bilaterally  V,VII: smile symmetric, facial light touch sensation equal VIII: hearing grossly normal to voice  X: uvula elevates symmetrically  XI: bilateral shoulder shrug symmetric and strong XII: midline tongue extension without fassiculations Motor:  Normal tone. 5/5 strength of BUE and BLE major muscle groups including strong and equal grip strength and dorsiflexion/plantar flexion Sensory: light touch normal in all extremities. Cerebellar: normal finger-to-nose with bilateral upper extremities Negative pronator drift      ED Results / Procedures / Treatments   Labs (all labs ordered are listed, but only abnormal results are displayed) Labs Reviewed  CBC WITH DIFFERENTIAL/PLATELET - Abnormal; Notable for the following components:      Result Value   RBC 3.91 (*)  Hemoglobin 11.5 (*)    HCT 38.4 (*)    MCHC 29.9 (*)    All other components within normal limits  COMPREHENSIVE METABOLIC PANEL - Abnormal; Notable for the following components:   Glucose, Bld 191 (*)    Albumin 3.2 (*)    Alkaline Phosphatase 153 (*)    All other components within normal limits  URINALYSIS, ROUTINE W REFLEX MICROSCOPIC - Abnormal; Notable for the following components:   Glucose, UA >=500 (*)    All other components  within normal limits    EKG EKG Interpretation  Date/Time:  Wednesday Apr 12 2020 16:29:51 EDT Ventricular Rate:  74 PR Interval:    QRS Duration: 167 QT Interval:  415 QTC Calculation: 461 R Axis:   -68 Text Interpretation: Poor data quality probable sinus rhythm RBBB and LAFB No significant change since last tracing Confirmed by Dorie Rank [phone]) on 04/12/2020 5:03:19 PM   Radiology DG Chest 1 View  Result Date: 04/12/2020 CLINICAL DATA:  Multiple falls, weakness EXAM: CHEST  1 VIEW COMPARISON:  03/03/2020 FINDINGS: Single frontal view of the chest demonstrates an unremarkable cardiac silhouette. No airspace disease, effusion, or pneumothorax. No acute bony abnormalities. IMPRESSION: 1. Stable exam, no acute process. Electronically Signed   By: Randa Ngo M.D.   On: 04/12/2020 17:41   CT Head Wo Contrast  Result Date: 04/12/2020 CLINICAL DATA:  Multiple falls. EXAM: CT HEAD WITHOUT CONTRAST CT CERVICAL SPINE WITHOUT CONTRAST TECHNIQUE: Multidetector CT imaging of the head and cervical spine was performed following the standard protocol without intravenous contrast. Multiplanar CT image reconstructions of the cervical spine were also generated. COMPARISON:  Head CT 03/03/2020, CT cervical spine 03/03/2020. FINDINGS: CT HEAD FINDINGS Brain: Stable, mild generalized parenchymal atrophy. There is no acute intracranial hemorrhage. No demarcated cortical infarct. No extra-axial fluid collection. No evidence of intracranial mass. No midline shift. Vascular: No hyperdense vessel. Skull: Normal. Negative for fracture or focal lesion. Sinuses/Orbits: Visualized orbits show no acute finding. Minimal ethmoid sinus mucosal thickening. No significant mastoid effusion. CT CERVICAL SPINE FINDINGS Alignment: Redemonstrated mild C4-C5, C5-C6 and C7-T1 grade 1 anterolisthesis. Skull base and vertebrae: The basion-dental and atlanto-dental intervals are maintained.No evidence of acute fracture to the  cervical spine. Soft tissues and spinal canal: No prevertebral fluid or swelling. No visible canal hematoma. Disc levels: Redemonstrated advanced cervical spondylosis with multilevel disc space narrowing, posterior disc osteophytes, uncovertebral and facet hypertrophy. As before, there is multilevel degenerative osseous fusion across the disc spaces and facet joints. Upper chest: No consolidation within the imaged lung apices. No visible pneumothorax IMPRESSION: CT head: 1. No evidence of acute intracranial abnormality. 2. Stable, mild generalized parenchymal atrophy. 3. Minimal ethmoid sinus mucosal thickening. CT cervical spine: 1. No evidence of acute fracture to the cervical spine. 2. Redemonstrated advanced cervical spondylosis with multilevel degenerative fusion. 3. Unchanged mild grade 1 anterolisthesis at C4-C5, C5-C6 and C7-T1. Electronically Signed   By: Kellie Simmering DO   On: 04/12/2020 17:21   CT Cervical Spine Wo Contrast  Result Date: 04/12/2020 CLINICAL DATA:  Multiple falls. EXAM: CT HEAD WITHOUT CONTRAST CT CERVICAL SPINE WITHOUT CONTRAST TECHNIQUE: Multidetector CT imaging of the head and cervical spine was performed following the standard protocol without intravenous contrast. Multiplanar CT image reconstructions of the cervical spine were also generated. COMPARISON:  Head CT 03/03/2020, CT cervical spine 03/03/2020. FINDINGS: CT HEAD FINDINGS Brain: Stable, mild generalized parenchymal atrophy. There is no acute intracranial hemorrhage. No demarcated cortical infarct. No extra-axial fluid collection.  No evidence of intracranial mass. No midline shift. Vascular: No hyperdense vessel. Skull: Normal. Negative for fracture or focal lesion. Sinuses/Orbits: Visualized orbits show no acute finding. Minimal ethmoid sinus mucosal thickening. No significant mastoid effusion. CT CERVICAL SPINE FINDINGS Alignment: Redemonstrated mild C4-C5, C5-C6 and C7-T1 grade 1 anterolisthesis. Skull base and  vertebrae: The basion-dental and atlanto-dental intervals are maintained.No evidence of acute fracture to the cervical spine. Soft tissues and spinal canal: No prevertebral fluid or swelling. No visible canal hematoma. Disc levels: Redemonstrated advanced cervical spondylosis with multilevel disc space narrowing, posterior disc osteophytes, uncovertebral and facet hypertrophy. As before, there is multilevel degenerative osseous fusion across the disc spaces and facet joints. Upper chest: No consolidation within the imaged lung apices. No visible pneumothorax IMPRESSION: CT head: 1. No evidence of acute intracranial abnormality. 2. Stable, mild generalized parenchymal atrophy. 3. Minimal ethmoid sinus mucosal thickening. CT cervical spine: 1. No evidence of acute fracture to the cervical spine. 2. Redemonstrated advanced cervical spondylosis with multilevel degenerative fusion. 3. Unchanged mild grade 1 anterolisthesis at C4-C5, C5-C6 and C7-T1. Electronically Signed   By: Kellie Simmering DO   On: 04/12/2020 17:21   MR BRAIN WO CONTRAST  Result Date: 04/12/2020 CLINICAL DATA:  Initial evaluation for acute altered mental status, frequent falls. EXAM: MRI HEAD WITHOUT CONTRAST TECHNIQUE: Multiplanar, multiecho pulse sequences of the brain and surrounding structures were obtained without intravenous contrast. COMPARISON:  Prior head CT from earlier same day. FINDINGS: Brain: Examination moderately degraded by motion artifact. Generalized age-related cerebral atrophy. No significant cerebral white matter disease for patient age. No abnormal foci of restricted diffusion to suggest acute or subacute ischemia. Gray-white matter differentiation maintained. No encephalomalacia to suggest chronic cortical infarction. No foci of susceptibility artifact to suggest acute or chronic intracranial hemorrhage. No mass lesion, midline shift or mass effect. Mild ventricular prominence related to global parenchymal volume loss without  hydrocephalus. No extra-axial fluid collection. Pituitary gland suprasellar region within normal limits. Midline structures intact. Vascular: Major intracranial vascular flow voids are maintained. Skull and upper cervical spine: Craniocervical junction within normal limits. Bone marrow signal intensity grossly within normal limits. No scalp soft tissue abnormality. Sinuses/Orbits: Patient status post ocular lens replacement on the right. Globes and orbital soft tissues demonstrate no acute finding. Paranasal sinuses are largely clear. Trace right mastoid effusion noted, of doubtful significance. Other: None. IMPRESSION: 1. No acute intracranial abnormality. 2. Stable mild age-related cerebral atrophy. Electronically Signed   By: Jeannine Boga M.D.   On: 04/12/2020 19:45   DG Knee Complete 4 Views Left  Result Date: 04/12/2020 CLINICAL DATA:  Multiple falls, weakness EXAM: LEFT KNEE - COMPLETE 4+ VIEW COMPARISON:  None. FINDINGS: Frontal, bilateral oblique, lateral views of the left knee are obtained. There is a lucent area with sclerotic margins within the medial aspect of the proximal tibial diaphysis, likely benign fibrous cortical defect. No fracture, subluxation, or dislocation. There is joint space narrowing most pronounced within the medial and lateral compartments with associated chondrocalcinosis consistent with osteoarthritis. No joint effusion. IMPRESSION: 1. Osteoarthritis.  No acute bony abnormality. Electronically Signed   By: Randa Ngo M.D.   On: 04/12/2020 17:43   DG Knee Complete 4 Views Right  Result Date: 04/12/2020 CLINICAL DATA:  Multiple falls, pain EXAM: RIGHT KNEE - COMPLETE 4+ VIEW COMPARISON:  None. FINDINGS: Frontal, bilateral oblique, lateral views of the right knee are obtained. There is moderate medial and lateral compartmental joint space narrowing with chondrocalcinosis consistent with osteoarthritis. No fracture, subluxation,  or dislocation. There is a large  suprapatellar joint effusion. IMPRESSION: 1. Large joint effusion. 2. Moderate medial and lateral compartmental osteoarthritis. 3. No acute displaced fracture. Electronically Signed   By: Randa Ngo M.D.   On: 04/12/2020 17:44   DG Hip Unilat W or Wo Pelvis 2-3 Views Left  Result Date: 04/12/2020 CLINICAL DATA:  Multiple falls, weakness, pain EXAM: DG HIP (WITH OR WITHOUT PELVIS) 2-3V LEFT COMPARISON:  04/05/2020 FINDINGS: Frontal view of the pelvis as well as frontal and frogleg lateral views of the left hip are obtained. Intramedullary rod with proximal dynamic screw and distal interlocking screw traversing a prior intertrochanteric left hip fracture unchanged in position. No new bony abnormality. Stable spondylosis lower lumbar spine. Sacroiliac joints are normal. IMPRESSION: 1. Stable postoperative changes from recent left hip fracture and ORIF. 2. No new fracture. Electronically Signed   By: Randa Ngo M.D.   On: 04/12/2020 17:47    Procedures Procedures (including critical care time)  Medications Ordered in ED Medications  acetaminophen (TYLENOL) tablet 650 mg (650 mg Oral Given 04/12/20 1959)    ED Course  I have reviewed the triage vital signs and the nursing notes.  Pertinent labs & imaging results that were available during my care of the patient were reviewed by me and considered in my medical decision making (see chart for details).    MDM Rules/Calculators/A&P                     79 y/o M presenting for generalized weakness and multiple falls after returning home from rehab following hip surgery.   Reviewed/interpreted labs CBC w/o leukocytosis or anemia CMP nonacute UA neg for UTI  CXR neg for acute process Xray pelvis with left hip with stable post operative changes but no acute traumatic injury Xray right knee with Large joint effusion. Moderate medial and lateral compartmental osteoarthritis. No acute displaced fracture.  Xray left knee left with OA but no  actue traumatic findings CT head/cervical spine - w/o acute traumatic injuries MR brain with no acute intracranial abnormality. 2. Stable mild age-related cerebral atrophy.  Pt with profound generalized weakness with ambulatory dysfunction. He is unable to walk in the ED. Feel he will require admission for PT/OT eval and further management.   8:00 PM CONSULT with Dr. Chancy Milroy with hospitalist service who accepts patient for admission.   Final Clinical Impression(s) / ED Diagnoses Final diagnoses:  Generalized weakness  Ambulatory dysfunction    Rx / DC Orders ED Discharge Orders    None       Bishop Dublin 04/12/20 2028    Dorie Rank, MD 04/16/20 1710

## 2020-04-12 NOTE — Telephone Encounter (Signed)
Pt's wife Christian [name] called back and left message.  She was unavailable to receive my messages yesterday and didn't receive them until after we had closed.  She says she cant get him to the office and wanted to know what she should do.  I spoke to my office supervisor, Christian [name].  She suggested that Christian [name] be transported to the ED by ambulance so they could assess him.  I called back and spoke to Christian [name].  I told her that I have spoken to Christian Butts and what her suggestion was. She agreed that this was probably best.  Christian [name] stated she didn't know why Christian [name] keeps falling and if he was at the ED, not only could they x-ray his hip, they could maybe run some lab work to see if he was having other kinds of problems.  She said a case worker from [facility] was coming around noon today and after that she will have ambulance take him to the ED.  I asked her to call me back and let me know what they were told.

## 2020-04-12 NOTE — Telephone Encounter (Signed)
Telephone call to patient to schedule palliative care visit with patient. His wife-Kathy advised that patient had a fall and was on his way to [facility] ED via EMS.

## 2020-04-12 NOTE — Patient Outreach (Signed)
[facility]) Care Management  04/12/2020  Christian [name] [facility] 08-07-1941 [id number]  Telephone outreach for post SNF assessment. Unable to reach [name] but left a message to return my call.  [name]. Myrtie Neither, MSN, [facility] Gerontological Nurse Practitioner [facility] Care Management [phone]

## 2020-04-12 NOTE — Telephone Encounter (Signed)
Pt. Wife called stating that he just had hip surgery recently and was just released from [facility] home Saturday. She said they put him on Humalog 5 units with meals daily and wasn't sure if he need to be on that as well as the [place] he is already on, they also told her he needed to f/u here with in 2 weeks. I did not see the pt. On my TOC report but if you would like him to f/u here I can get him scheduled.

## 2020-04-13 DIAGNOSIS — E1169 Type 2 diabetes mellitus with other specified complication: Secondary | ICD-10-CM | POA: Diagnosis not present

## 2020-04-13 DIAGNOSIS — M25461 Effusion, right knee: Secondary | ICD-10-CM | POA: Diagnosis present

## 2020-04-13 DIAGNOSIS — R1310 Dysphagia, unspecified: Secondary | ICD-10-CM | POA: Diagnosis not present

## 2020-04-13 DIAGNOSIS — E7800 Pure hypercholesterolemia, unspecified: Secondary | ICD-10-CM | POA: Diagnosis present

## 2020-04-13 DIAGNOSIS — R262 Difficulty in walking, not elsewhere classified: Secondary | ICD-10-CM

## 2020-04-13 DIAGNOSIS — Z66 Do not resuscitate: Secondary | ICD-10-CM | POA: Diagnosis present

## 2020-04-13 DIAGNOSIS — F840 Autistic disorder: Secondary | ICD-10-CM | POA: Diagnosis present

## 2020-04-13 DIAGNOSIS — M1712 Unilateral primary osteoarthritis, left knee: Secondary | ICD-10-CM | POA: Diagnosis present

## 2020-04-13 DIAGNOSIS — H9190 Unspecified hearing loss, unspecified ear: Secondary | ICD-10-CM | POA: Diagnosis present

## 2020-04-13 DIAGNOSIS — R531 Weakness: Secondary | ICD-10-CM | POA: Diagnosis present

## 2020-04-13 DIAGNOSIS — E785 Hyperlipidemia, unspecified: Secondary | ICD-10-CM | POA: Diagnosis present

## 2020-04-13 DIAGNOSIS — F0391 Unspecified dementia with behavioral disturbance: Secondary | ICD-10-CM | POA: Diagnosis not present

## 2020-04-13 DIAGNOSIS — R251 Tremor, unspecified: Secondary | ICD-10-CM | POA: Diagnosis present

## 2020-04-13 DIAGNOSIS — N400 Enlarged prostate without lower urinary tract symptoms: Secondary | ICD-10-CM | POA: Diagnosis not present

## 2020-04-13 DIAGNOSIS — M1990 Unspecified osteoarthritis, unspecified site: Secondary | ICD-10-CM | POA: Diagnosis not present

## 2020-04-13 DIAGNOSIS — E538 Deficiency of other specified B group vitamins: Secondary | ICD-10-CM | POA: Diagnosis not present

## 2020-04-13 DIAGNOSIS — I482 Chronic atrial fibrillation, unspecified: Secondary | ICD-10-CM | POA: Diagnosis present

## 2020-04-13 DIAGNOSIS — D6852 Prothrombin gene mutation: Secondary | ICD-10-CM | POA: Diagnosis present

## 2020-04-13 DIAGNOSIS — G4733 Obstructive sleep apnea (adult) (pediatric): Secondary | ICD-10-CM | POA: Diagnosis present

## 2020-04-13 DIAGNOSIS — Z9989 Dependence on other enabling machines and devices: Secondary | ICD-10-CM | POA: Diagnosis not present

## 2020-04-13 DIAGNOSIS — R5381 Other malaise: Secondary | ICD-10-CM | POA: Diagnosis not present

## 2020-04-13 DIAGNOSIS — Z20822 Contact with and (suspected) exposure to covid-19: Secondary | ICD-10-CM | POA: Diagnosis present

## 2020-04-13 DIAGNOSIS — D529 Folate deficiency anemia, unspecified: Secondary | ICD-10-CM | POA: Diagnosis not present

## 2020-04-13 DIAGNOSIS — J40 Bronchitis, not specified as acute or chronic: Secondary | ICD-10-CM | POA: Diagnosis not present

## 2020-04-13 DIAGNOSIS — E1165 Type 2 diabetes mellitus with hyperglycemia: Secondary | ICD-10-CM | POA: Diagnosis present

## 2020-04-13 DIAGNOSIS — S80212A Abrasion, left knee, initial encounter: Secondary | ICD-10-CM | POA: Diagnosis present

## 2020-04-13 DIAGNOSIS — F329 Major depressive disorder, single episode, unspecified: Secondary | ICD-10-CM | POA: Diagnosis present

## 2020-04-13 DIAGNOSIS — R296 Repeated falls: Secondary | ICD-10-CM

## 2020-04-13 DIAGNOSIS — W19XXXA Unspecified fall, initial encounter: Secondary | ICD-10-CM | POA: Diagnosis not present

## 2020-04-13 DIAGNOSIS — R609 Edema, unspecified: Secondary | ICD-10-CM | POA: Diagnosis not present

## 2020-04-13 DIAGNOSIS — F0390 Unspecified dementia without behavioral disturbance: Secondary | ICD-10-CM | POA: Diagnosis present

## 2020-04-13 DIAGNOSIS — D509 Iron deficiency anemia, unspecified: Secondary | ICD-10-CM | POA: Diagnosis not present

## 2020-04-13 DIAGNOSIS — Z9181 History of falling: Secondary | ICD-10-CM | POA: Diagnosis not present

## 2020-04-13 DIAGNOSIS — F419 Anxiety disorder, unspecified: Secondary | ICD-10-CM | POA: Diagnosis present

## 2020-04-13 DIAGNOSIS — G9341 Metabolic encephalopathy: Secondary | ICD-10-CM | POA: Diagnosis present

## 2020-04-13 DIAGNOSIS — Y92129 Unspecified place in nursing home as the place of occurrence of the external cause: Secondary | ICD-10-CM | POA: Diagnosis not present

## 2020-04-13 DIAGNOSIS — M549 Dorsalgia, unspecified: Secondary | ICD-10-CM | POA: Diagnosis present

## 2020-04-13 DIAGNOSIS — I10 Essential (primary) hypertension: Secondary | ICD-10-CM | POA: Diagnosis present

## 2020-04-13 DIAGNOSIS — R269 Unspecified abnormalities of gait and mobility: Secondary | ICD-10-CM | POA: Diagnosis present

## 2020-04-13 DIAGNOSIS — R41841 Cognitive communication deficit: Secondary | ICD-10-CM | POA: Diagnosis not present

## 2020-04-13 DIAGNOSIS — G8929 Other chronic pain: Secondary | ICD-10-CM | POA: Diagnosis present

## 2020-04-13 LAB — CBC
HCT: 39.2 % (ref 39.0–52.0)
Hemoglobin: 12 g/dL — ABNORMAL LOW (ref 13.0–17.0)
MCH: 29.5 pg (ref 26.0–34.0)
MCHC: 30.6 g/dL (ref 30.0–36.0)
MCV: 96.3 fL (ref 80.0–100.0)
Platelets: 241 10*3/uL (ref 150–400)
RBC: 4.07 MIL/uL — ABNORMAL LOW (ref 4.22–5.81)
RDW: 13 % (ref 11.5–15.5)
WBC: 6 10*3/uL (ref 4.0–10.5)
nRBC: 0 % (ref 0.0–0.2)

## 2020-04-13 LAB — COMPREHENSIVE METABOLIC PANEL
ALT: 16 U/L (ref 0–44)
AST: 14 U/L — ABNORMAL LOW (ref 15–41)
Albumin: 2.9 g/dL — ABNORMAL LOW (ref 3.5–5.0)
Alkaline Phosphatase: 128 U/L — ABNORMAL HIGH (ref 38–126)
Anion gap: 11 (ref 5–15)
BUN: 13 mg/dL (ref 8–23)
CO2: 25 mmol/L (ref 22–32)
Calcium: 9.1 mg/dL (ref 8.9–10.3)
Chloride: 106 mmol/L (ref 98–111)
Creatinine, Ser: 0.55 mg/dL — ABNORMAL LOW (ref 0.61–1.24)
GFR calc Af Amer: >60 mL/min (ref >60)
GFR calc non Af Amer: >60 mL/min (ref >60)
Glucose, Bld: 154 mg/dL — ABNORMAL HIGH (ref 70–99)
Potassium: 4.1 mmol/L (ref 3.5–5.1)
Sodium: 142 mmol/L (ref 135–145)
Total Bilirubin: 0.4 mg/dL (ref 0.3–1.2)
Total Protein: 6.2 g/dL — ABNORMAL LOW (ref 6.5–8.1)

## 2020-04-13 LAB — GLUCOSE, CAPILLARY
Glucose-Capillary: 152 mg/dL — ABNORMAL HIGH (ref 70–99)
Glucose-Capillary: 249 mg/dL — ABNORMAL HIGH (ref 70–99)
Glucose-Capillary: 218 mg/dL — ABNORMAL HIGH (ref 70–99)
Glucose-Capillary: 292 mg/dL — ABNORMAL HIGH (ref 70–99)

## 2020-04-13 LAB — VITAMIN B12: Vitamin B-12: 175 pg/mL — ABNORMAL LOW (ref 180–914)

## 2020-04-13 LAB — T4, FREE: Free T4: 1.12 ng/dL (ref 0.61–1.12)

## 2020-04-13 LAB — TSH: TSH: 0.939 u[IU]/mL (ref 0.350–4.500)

## 2020-04-13 LAB — FOLATE: Folate: 29.9 ng/mL (ref >5.9)

## 2020-04-13 NOTE — Evaluation (Signed)
Occupational Therapy Evaluation Patient Details Name: Christian [name] MRN: [id number] DOB: 1941-10-01 Today's Date: 04/13/2020    History of Present Illness Christian [name] is a 79 y.o. male with medical history significant of hypertension, hyperlipidemia, dementia, diabetes mellitus and obstructive sleep apnea scented to ED for evaluation of severe generalized weakness and frequent recent falls.  Patient is a poor historian most likely secondary to underlying dementia but able to answer the review of systems questions appropriately therefore history is gathered from the ED physician.  According to the ED physician, patient's wife reported that patient was just discharged from any [facility] 4 days ago and since then patient is having severe generalized weakness and unable to stand and ambulate.  Patient's wife also reported to the ED physician that patient has a wheelchair and a walker that he uses for ambulation at baseline but he is unable to use his walker for ambulation.  Patient has a history of dementia at baseline but he is more confused than his baseline.  Patient admits of having some neck pain and bilateral leg pain otherwise he denies fever, chills, chest pain, shortness of breath, nausea, vomiting, abdominal pain and urinary symptoms.  ED physician also spoke to the staff from nursing home and it was reported that patient was able to ambulate with the help of a walker.  The nursing home staff also reported that patient does have some baseline confusion but his mental status was improving upon discharge.   Clinical Impression   Pt agreeable to OT evaluation, PT joined shortly after beginning session. Pt is familiar to rehab staff from last admission. Pt with cognitive deficits at baseline, chronic generalized weakness. Pt requiring significant assistance with mobility and ADLs this am. Recommend SNF on discharge, with potential discussion of transition to LTC if pts wife is unable to  provide necessary level of physical assistance. No further acute OT services required at this time.     Follow Up Recommendations  SNF;Supervision/Assistance - 24 hour    Equipment Recommendations  None recommended by OT       Precautions / Restrictions Precautions Precautions: Fall Restrictions Weight Bearing Restrictions: No      Mobility Bed Mobility Overal bed mobility: Needs Assistance Bed Mobility: Supine to Sit     Supine to sit: Mod assist;+2 for physical assistance        Transfers                 General transfer comment: Defer to PT note        ADL either performed or assessed with clinical judgement   ADL Overall ADL's : Needs assistance/impaired Eating/Feeding: Set up;Sitting Eating/Feeding Details (indicate cue type and reason): assistance for opening packaging after pt attempts. Pt unable to cut food, but folded like a sandwich and ate with hands Grooming: Set up Grooming Details (indicate cue type and reason): Pt able to perform grooming in sitting with set-up, unable to perform in standing             Lower Body Dressing: Total assistance;Bed level   Toilet Transfer: Moderate assistance;Stand-pivot Toilet Transfer Details (indicate cue type and reason): simulated with bed to chair transfer           General ADL Comments: Pt requiring signficant assistance due to generalized weakness and decreased activity tolerance. Pt also with balance deficits limiting standing tasks     Vision Baseline Vision/History: No visual deficits Patient Visual Report: No change from baseline Vision Assessment?:  No apparent visual deficits            Pertinent Vitals/Pain Pain Assessment: 0-10 Pain Score: 9  Pain Location: "all over" Pain Descriptors / Indicators: Grimacing Pain Intervention(s): Limited activity within patient's tolerance;Monitored during session;Repositioned     Hand Dominance Right   Extremity/Trunk Assessment Upper  Extremity Assessment Upper Extremity Assessment: Generalized weakness   Lower Extremity Assessment Lower Extremity Assessment: Defer to PT evaluation       Communication Communication Communication: No difficulties   Cognition Arousal/Alertness: Awake/alert Behavior During Therapy: WFL for tasks assessed/performed Overall Cognitive Status: History of cognitive impairments - at baseline                                                Home Living   Living Arrangements: Spouse/significant other Available Help at Discharge: Family;Available 24 hours/day Type of Home: House Home Access: Stairs to enter [employer] of Steps: 1 Entrance Stairs-Rails: None Home Layout: Multi-level;Able to live on main level with bedroom/bathroom Alternate Level Stairs-Number of Steps: 11-12 steps to basement Alternate Level Stairs-Rails: Right Bathroom Shower/Tub: Teacher, early years/pre: Standard     Home Equipment: Environmental consultant - 2 wheels;Cane - single point;Shower seat - built in;Bedside commode          Prior Functioning/Environment Level of Independence: Needs assistance  Gait / Transfers Assistance Needed: Per chart review pt was using RW at [facility] on discharge ADL's / Homemaking Assistance Needed: Pt requiring assistance with all ADLs due to generalized weakness, balance deficits, and fatigue            OT Problem List: Decreased strength;Decreased activity tolerance;Impaired balance (sitting and/or standing);Decreased safety awareness;Decreased knowledge of use of DME or AE;Impaired UE functional use                       Co-evaluation PT/OT/SLP Co-Evaluation/Treatment: Yes Reason for Co-Treatment: Complexity of the patient's impairments (multi-system involvement);To address functional/ADL transfers   OT goals addressed during session: ADL's and self-care      AM-PAC OT "6 Clicks" Daily Activity     Outcome Measure Help from another  person eating meals?: A Little Help from another person taking care of personal grooming?: A Little Help from another person toileting, which includes using toliet, bedpan, or urinal?: A Lot Help from another person bathing (including washing, rinsing, drying)?: A Lot Help from another person to put on and taking off regular upper body clothing?: A Lot Help from another person to put on and taking off regular lower body clothing?: A Lot 6 Click Score: 14   End of Session Equipment Utilized During Treatment: Rolling walker  Activity Tolerance: Patient tolerated treatment well Patient left: in chair;with call bell/phone within reach;with chair alarm set  OT Visit Diagnosis: Muscle weakness (generalized) (M62.81);Repeated falls (R29.6)                Time: [id number] OT Time Calculation (min): 31 min Charges:  OT General Charges $OT Visit: 1 Visit OT Evaluation $OT Eval Moderate Complexity: [place], OTR/L  [phone] 04/13/2020, 10:05 AM

## 2020-04-13 NOTE — Evaluation (Signed)
Physical Therapy Evaluation Patient Details Name: Christian [name] MRN: [id number] DOB: May 07, 1941 Today's Date: 04/13/2020   History of Present Illness  Christian [name] is a 79 y.o. male with medical history significant of hypertension, hyperlipidemia, dementia, diabetes mellitus and obstructive sleep apnea scented to ED for evaluation of severe generalized weakness and frequent recent falls.  Patient is a poor historian most likely secondary to underlying dementia but able to answer the review of systems questions appropriately therefore history is gathered from the ED physician.  According to the ED physician, patient's wife reported that patient was just discharged from any [facility] 4 days ago and since then patient is having severe generalized weakness and unable to stand and ambulate.  Patient's wife also reported to the ED physician that patient has a wheelchair and a walker that he uses for ambulation at baseline but he is unable to use his walker for ambulation.  Patient has a history of dementia at baseline but he is more confused than his baseline.  Patient admits of having some neck pain and bilateral leg pain otherwise he denies fever, chills, chest pain, shortness of breath, nausea, vomiting, abdominal pain and urinary symptoms.  ED physician also spoke to the staff from nursing home and it was reported that patient was able to ambulate with the help of a walker.  The nursing home staff also reported that patient does have some baseline confusion but his mental status was improving upon discharge.    Clinical Impression  Patient demonstrates slow labored movement for sitting up at bedside requiring repeated verbal/tactile cueing for proper use of BUE during supine to sitting and scooting forward, very unsteady on feet and at high risk for falls, limited to a few side steps at bedside due to weakness/fatigue and tolerated sitting up in chair after therapy - RN notified.  Patient will  benefit from continued physical therapy in hospital and recommended venue below to increase strength, balance, endurance for safe ADLs and gait.     Follow Up Recommendations SNF    Equipment Recommendations  None recommended by PT    Recommendations for Other Services       Precautions / Restrictions Precautions Precautions: Fall Restrictions Weight Bearing Restrictions: No      Mobility  Bed Mobility Overal bed mobility: Needs Assistance Bed Mobility: Supine to Sit     Supine to sit: Mod assist;+2 for physical assistance     General bed mobility comments: slow labored movement with repeated verbal/tactile cueing for proper use of BUE during supine to sitting and scooting foward at bedside  Transfers Overall transfer level: Needs assistance   Transfers: Sit to/from Stand;Stand Pivot Transfers Sit to Stand: Mod assist Stand pivot transfers: Mod assist       General transfer comment: slow labored movement  Ambulation/Gait Ambulation/Gait assistance: Mod assist;Max assist Gait Distance (Feet): 5 Feet Assistive device: Rolling walker (2 wheeled) Gait Pattern/deviations: Decreased step length - right;Decreased step length - left;Decreased stride length Gait velocity: decreased   General Gait Details: limited to 5-6 slow unsteady labored side steps with frequent near loss of balance due to weakness, poor standing balance  Stairs            Wheelchair Mobility    Modified Rankin (Stroke Patients Only)       Balance Overall balance assessment: Needs assistance Sitting-balance support: Feet supported;No upper extremity supported Sitting balance-Leahy Scale: Fair Sitting balance - Comments: seated at EOB   Standing balance support: During  functional activity;Bilateral upper extremity supported Standing balance-Leahy Scale: Poor Standing balance comment: using RW                             Pertinent Vitals/Pain Pain Assessment:  Faces Pain Score: 9  Faces Pain Scale: Hurts little more Pain Location: "all over" Pain Descriptors / Indicators: Grimacing Pain Intervention(s): Limited activity within patient's tolerance;Monitored during session    Home Living Family/patient expects to be discharged to:: Private residence Living Arrangements: Spouse/significant other Available Help at Discharge: Family;Available 24 hours/day Type of Home: House Home Access: Stairs to enter Entrance Stairs-Rails: None Entrance Stairs-Number of Steps: 1 Home Layout: Multi-level;Able to live on main level with bedroom/bathroom Home Equipment: Gilford Rile - 2 wheels;Cane - single point;Shower seat - built in;Bedside commode Additional Comments: Sleeps in lounge chair at home    Prior Function Level of Independence: Needs assistance   Gait / Transfers Assistance Needed: Per chart review pt was using RW at [facility] on discharge  ADL's / Homemaking Assistance Needed: Pt requiring assistance with all ADLs due to generalized weakness, balance deficits, and fatigue        Hand Dominance   Dominant Hand: Right    Extremity/Trunk Assessment   Upper Extremity Assessment Upper Extremity Assessment: Defer to OT evaluation    Lower Extremity Assessment Lower Extremity Assessment: Generalized weakness    Cervical / Trunk Assessment Cervical / Trunk Assessment: Normal  Communication   Communication: No difficulties  Cognition Arousal/Alertness: Awake/alert Behavior During Therapy: WFL for tasks assessed/performed Overall Cognitive Status: History of cognitive impairments - at baseline                                        General Comments      Exercises     Assessment/Plan    PT Assessment Patient needs continued PT services  PT Problem List Decreased strength;Decreased activity tolerance;Decreased balance;Decreased mobility       PT Treatment Interventions Gait training;Stair training;Functional mobility  training;Therapeutic activities;Therapeutic exercise;Patient/family education    PT Goals (Current goals can be found in the Care Plan section)  Acute Rehab PT Goals Patient Stated Goal: return home with family to assist PT Goal Formulation: With patient Time For Goal Achievement: 04/27/20 Potential to Achieve Goals: Good    Frequency Min 3X/week   Barriers to discharge        Co-evaluation PT/OT/SLP Co-Evaluation/Treatment: Yes Reason for Co-Treatment: For patient/therapist safety;Necessary to address cognition/behavior during functional activity PT goals addressed during session: Mobility/safety with mobility;Balance;Proper use of DME OT goals addressed during session: ADL's and self-care       AM-PAC PT "6 Clicks" Mobility  Outcome Measure Help needed turning from your back to your side while in a flat bed without using bedrails?: A Lot Help needed moving from lying on your back to sitting on the side of a flat bed without using bedrails?: A Lot Help needed moving to and from a bed to a chair (including a wheelchair)?: A Lot Help needed standing up from a chair using your arms (e.g., wheelchair or bedside chair)?: A Lot Help needed to walk in hospital room?: A Lot Help needed climbing 3-5 steps with a railing? : Total 6 Click Score: 11    End of Session   Activity Tolerance: Patient tolerated treatment well;Patient limited by fatigue Patient left: in chair;with call bell/phone  within reach;with chair alarm set Nurse Communication: Mobility status PT Visit Diagnosis: Unsteadiness on feet (R26.81);Other abnormalities of gait and mobility (R26.89);Muscle weakness (generalized) (M62.81)    Time: [id number] PT Time Calculation (min) (ACUTE ONLY): 20 min   Charges:   PT Evaluation $PT Eval Moderate Complexity: 1 Mod PT Treatments $Therapeutic Activity: 8-22 mins        10:21 AM, 04/13/20 Lonell Grandchild, MPT Physical Therapist with [facility] 336  [phone] office [phone] mobile phone

## 2020-04-13 NOTE — NC FL2 (Signed)
[place] MEDICAID FL2 LEVEL OF CARE SCREENING TOOL     IDENTIFICATION  Patient Name: Christian [name] Birthdate: 06/25/1941 Sex: male Admission Date (Current Location): 04/12/2020  [facility] and [place] Number:  [employer] and Address:  [place] [address] [place]      Provider Number: [id number]  Attending Physician Name and Address:  Orson Eva, MD  Relative Name and Phone Number:  Lynch Guckert [phone]    Current Level of Care: Hospital Recommended Level of Care: [place] Prior Approval Number:    Date Approved/Denied:   PASRR Number: Pending  Discharge Plan: SNF    Current Diagnoses: Patient Active Problem List   Diagnosis Date Noted  . Falls frequently 04/13/2020  . Hyperglycemia due to diabetes mellitus ([place]) 04/13/2020  . Acute metabolic encephalopathy [id number]  . Ambulatory dysfunction 04/12/2020  . Hyperlipidemia associated with type 2 diabetes mellitus ([place]) 03/14/2020  . Acute blood loss anemia 03/14/2020  . Chronic constipation 03/14/2020  . Depression due to dementia ([place]) 03/10/2020  . Displaced intertrochanteric fracture of left femur ([place])   . S/p left hip fracture 03/03/2020  . Benign prostatic hyperplasia with lower urinary tract symptoms 06/04/2019  . Urge incontinence 06/04/2019  . History of stroke 06/02/2019  . Medicare annual wellness visit, subsequent 06/02/2019  . Urinary incontinence 10/28/2018  . Polyuria 10/28/2018  . Dermatitis 10/28/2018  . Tinea cruris 03/25/2018  . Tick bite 03/25/2018  . Balanitis 03/25/2018  . At moderate risk for fall 12/31/2017  . Alcohol abuse 12/29/2017  . History of pulmonary embolism 12/29/2017  . Depression, recurrent ([place]) 12/29/2017  . Alcohol intoxication ([place]) 12/16/2017  . Wears hearing aid 06/09/2017  . Tinnitus of both ears 06/09/2017  . Bilateral impacted cerumen 06/09/2017  . Left wrist pain 06/26/2016  . Morning joint  stiffness 06/26/2016  . Polyarthralgia 06/26/2016  . Insulin dependent type 2 diabetes mellitus, uncontrolled ([place]) 04/02/2016  . Gait disturbance 04/02/2016  . Physical deconditioning 04/02/2016  . Pain of both shoulder joints 04/02/2016  . Leg weakness, bilateral 04/02/2016  . Encounter for therapeutic drug monitoring 11/29/2015  . Encounter for health maintenance examination in adult 10/30/2015  . Shoulder pain, bilateral 10/30/2015  . Muscle weakness 10/30/2015  . Generalized weakness 10/30/2015  . Rash 10/30/2015  . PVC (premature ventricular contraction) 10/10/2015  . Decreased energy 06/20/2015  . Chronic pulmonary embolism ([place]) 06/20/2015  . DVT (deep venous thrombosis) ([place]) 06/20/2015  . Hammer toe of left foot 06/20/2015  . Pre-ulcerative calluses 06/20/2015  . Hypertrophic toenail 06/20/2015  . Diabetic mononeuropathy associated with diabetes mellitus due to underlying condition ([place]) 06/20/2015  . Leg pain, bilateral 06/20/2015  . Chronic low back pain 06/20/2015  . 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 06/14/2015  . OSA on CPAP 03/06/2015  . Edema 03/06/2015  . Leg pain, diffuse 03/06/2015  . Bilateral low back pain without sciatica 03/06/2015  . Tremor 03/06/2015  . Insomnia 03/06/2015  . Vaccine counseling 03/06/2015  . Chronic back pain 03/06/2015  . Wears glasses 03/06/2015  . Hearing loss 03/06/2015  . Spinal stenosis of lumbar region 03/06/2015  . Ptosis 03/06/2015  . Positive depression screening 03/06/2015  . Advance directive discussed with patient 03/06/2015  . Bradycardia 02/27/2015  . Current use of long term anticoagulation   . Dementia without behavioral disturbance ([employer])   . Hypertension associated with type 2 diabetes mellitus ([place]) 02/08/2015  . Hyperlipidemia  02/08/2015  . OSA (obstructive sleep apnea) 02/08/2015  . Memory loss 03/12/2013  . Diabetes mellitus ([place]) 03/12/2013     Orientation RESPIRATION BLADDER Height & Weight     Self, Situation, Place  Normal Continent Weight: 108.3 kg Height:  5\' 11"  (180.3 cm)  BEHAVIORAL SYMPTOMS/MOOD NEUROLOGICAL BOWEL NUTRITION STATUS      Continent Diet(Heart Healthy Carb modified)  AMBULATORY STATUS COMMUNICATION OF NEEDS Skin   Extensive Assist Verbally Skin abrasions(knee and elbows)                       Personal Care Assistance Level of Assistance  Bathing, Feeding, Dressing Bathing Assistance: Maximum assistance Feeding assistance: Independent Dressing Assistance: Limited assistance     Functional Limitations Info  Sight, Speech, Hearing Sight Info: Impaired Hearing Info: Impaired Speech Info: Adequate    SPECIAL CARE FACTORS FREQUENCY  PT (By licensed PT)     PT Frequency: 5 times a week              Contractures Contractures Info: Not present    Additional Factors Info  Code Status, Allergies Code Status Info: DNR Allergies Info: Bee venon, ultram, oxycodone,           Current Medications (04/13/2020):  This is the current hospital active medication list Current Facility-Administered Medications  Medication Dose Route Frequency Provider Last Rate Last Admin  . acetaminophen (TYLENOL) tablet 650 mg  650 mg Oral Q6H PRN Bunnie Pion Z, DO       Or  . acetaminophen (TYLENOL) suppository 650 mg  650 mg Rectal Q6H PRN Bunnie Pion Z, DO      . diltiazem (CARDIZEM) tablet 30 mg  30 mg Oral BID Bunnie Pion Z, DO   30 mg at 04/13/20 [id number]  . donepezil (ARICEPT) tablet 10 mg  10 mg Oral QHS Bunnie Pion Z, DO   10 mg at 04/12/20 2337  . fesoterodine (TOVIAZ) tablet 8 mg  8 mg Oral Daily Bunnie Pion Z, DO   8 mg at 04/13/20 [id number]  . flecainide (TAMBOCOR) tablet 75 mg  75 mg Oral BID Bunnie Pion Z, DO   75 mg at 04/13/20 [id number]  . folic acid (FOLVITE) tablet 1 mg  1 mg Oral q AM Bunnie Pion Z, DO   1 mg at 04/13/20 [id number]  . furosemide (LASIX) tablet 40 mg  40 mg Oral Daily  PRN Bunnie Pion Z, DO      . insulin aspart (novoLOG) injection 0-5 Units  0-5 Units Subcutaneous QHS Bunnie Pion Z, DO      . insulin aspart (novoLOG) injection 0-9 Units  0-9 Units Subcutaneous TID WC Bunnie Pion Z, DO   3 Units at 04/13/20 1222  . memantine (NAMENDA XR) 24 hr capsule 28 mg  28 mg Oral q AM Bunnie Pion Z, DO   28 mg at 04/13/20 1050  . naproxen (NAPROSYN) tablet 250 mg  250 mg Oral Daily PRN Bunnie Pion Z, DO      . ondansetron [facility]) tablet 4 mg  4 mg Oral Q6H PRN Bunnie Pion Z, DO       Or  . ondansetron [facility]) injection 4 mg  4 mg Intravenous Q6H PRN Bunnie Pion Z, DO      . polyethylene glycol (MIRALAX / GLYCOLAX) packet 17 g  17 g Oral BID Bunnie Pion Z, DO   17 g at 04/13/20 0908  . rivaroxaban (XARELTO) tablet 20 mg  20 mg Oral Q  supper Bunnie Pion Z, DO   20 mg at 04/12/20 2347  . rosuvastatin (CRESTOR) tablet 10 mg  10 mg Oral QHS Bunnie Pion Z, DO   10 mg at 04/12/20 2337  . sertraline (ZOLOFT) tablet 50 mg  50 mg Oral q AM Bunnie Pion Z, DO   50 mg at 04/13/20 [id number]  . tamsulosin (FLOMAX) capsule 0.4 mg  0.4 mg Oral q AM Bunnie Pion Z, DO   0.4 mg at 04/13/20 [id number]     Discharge Medications: Please see discharge summary for a list of discharge medications.  Relevant Imaging Results:  Relevant Lab Results:   Additional Information [id number]  Boneta Lucks, RN

## 2020-04-13 NOTE — Progress Notes (Addendum)
PROGRESS NOTE  Christian [name] [id number] DOB: July 06, 1941 DOA: 04/12/2020 PCP: Carlena Hurl, PA-C  Brief History:  79 year old male with a history of chronic back pain, diabetes mellitus type 2, DVT/PE from prothrombin gene mutation, hypertension, hyperlipidemia, dementia, OSA presenting with generalized weakness and frequent falls.  The patient was recently discharged from the hospital after a stay from 03/03/2020 to 03/07/2020 after he suffered a left intertrochanteric hip fracture.  He underwent operative repair by Dr. Arther Abbott on 03/05/2020 with placement of an IM nail.  And was discharged to the [facility].  He did relatively well and was able to use a walker with some mild confusion at the time he left.  At baseline, the patient is pleasantly confused, but was improving at the time of his discharge.  According to the patient's spouse, the patient has had more falls and generalized weakness.  There is been no reports of fevers, chills, chest pain, short of breath, nausea, vomiting, diarrhea, abdominal pain.  Assessment/Plan: Generalized weakness/frequent falls -Likely from deconditioning -MRI brain negative for acute findings -UA negative for pyuria -TSH -serum [id number] -Folic acid -PT evaluation-->SNF  Acute metabolic encephalopathy -Patient appears to be back to baseline this morning -He was intermittently pleasantly confused -UA negative for pyuria -MRI brain negative  Uncontrolled diabetes mellitus type 2 with hyperglycemia -03/04/2020 hemoglobin A1c 10.9 -NovoLog sliding scale -Continue reduced dose Lantus -Holding metformin  Dementia without behavioral disturbance -Continue Aricept and Namenda  Chronic atrial fibrillation -Continue flecainide and diltiazem -Continue rivaroxaban  Prothrombin gene mutation/DVT/PE -Continue rivaroxaban  Left knee effusion -Likely secondary to the patient's frequent falling -X-ray without any osseous  abnormalities -discussed with ortho--Dr. Zacarias Pontes will see patient, may need arthrocentesis to r/o hemarthrosis  Depression/anxiety -Continue sertraline  Hyperlipidemia -Continue Crestor     Status is: Observation    Dispo: The patient is from: Home              Anticipated d/c is to: SNF              Anticipated d/c date is: 1 day              Patient currently not medically stable--Left Knee effusion, concerned about hemarthrosis--consulted ortho--Dr. Aline Brochure        Family Communication:   Spouse updated  Consultants:  Ortho--[place]  Code Status:  DNR  DVT Prophylaxis:  Xarelto   Procedures: As Listed in Progress Note Above  Antibiotics: None  RN Pressure Injury Documentation:        Subjective: Patient complains of pain all over.  Denies cp, sob, n/v/d, abd pain.  Objective: Vitals:   04/12/20 2218 04/12/20 2242 04/13/20 0256 04/13/20 0650  BP: 130/69 (!) 150/71 132/66 126/65  Pulse: 73 71 71 61  Resp: 16 20 18 20   Temp: 98.5 F (36.9 C) 97.6 F (36.4 C) 98.3 F (36.8 C) 97.9 F (36.6 C)  TempSrc: Oral Oral Oral Oral  SpO2: 96% 94% 98% 99%  Weight:      Height:        Intake/Output Summary (Last 24 hours) at 04/13/2020 1045 Last data filed at 04/13/2020 0900 Gross per 24 hour  Intake 240 ml  Output 350 ml  Net -110 ml   Weight change:  Exam:   General:  Pt is alert, follows commands appropriately, not in acute distress  HEENT: No icterus, No thrush, No neck mass, [place]/AT  Cardiovascular: RRR, S1/S2, no rubs,  no gallops  Respiratory: CTA bilaterally, no wheezing, no crackles, no rhonchi  Abdomen: Soft/+BS, non tender, non distended, no guarding  Extremities: 1+ LE edema, No lymphangitis, No petechiae, No rashes, no synovitis   Data Reviewed: I have personally reviewed following labs and imaging studies Basic Metabolic Panel: Recent Labs  Lab 04/12/20 1608 04/13/20 0610  NA 138 142  K 4.5 4.1  CL 104 106  CO2  24 25  GLUCOSE 191* 154*  BUN 16 13  CREATININE 0.61 0.55*  CALCIUM 9.0 9.1   Liver Function Tests: Recent Labs  Lab 04/12/20 1608 04/13/20 0610  AST 20 14*  ALT 19 16  ALKPHOS 153* 128*  BILITOT 0.5 0.4  PROT 7.1 6.2*  ALBUMIN 3.2* 2.9*   No results for input(s): LIPASE, AMYLASE in the last 168 hours. No results for input(s): AMMONIA in the last 168 hours. Coagulation Profile: No results for input(s): INR, PROTIME in the last 168 hours. CBC: Recent Labs  Lab 04/12/20 1608 04/13/20 0610  WBC 8.8 6.0  NEUTROABS 6.8  --   HGB 11.5* 12.0*  HCT 38.4* 39.2  MCV 98.2 96.3  PLT 231 241   Cardiac Enzymes: No results for input(s): CKTOTAL, CKMB, CKMBINDEX, TROPONINI in the last 168 hours. BNP: Invalid input(s): POCBNP CBG: Recent Labs  Lab 04/12/20 2336 04/13/20 0741  GLUCAP 130* 152*   HbA1C: No results for input(s): HGBA1C in the last 72 hours. Urine analysis:    Component Value Date/Time   COLORURINE YELLOW 04/12/2020 1815   APPEARANCEUR CLEAR 04/12/2020 1815   LABSPEC 1.028 04/12/2020 1815   LABSPEC 1.015 10/28/2018 1522   PHURINE 5.0 04/12/2020 1815   GLUCOSEU >=500 (A) 04/12/2020 1815   HGBUR NEGATIVE 04/12/2020 1815   BILIRUBINUR NEGATIVE 04/12/2020 1815   BILIRUBINUR negative 10/28/2018 1522   BILIRUBINUR n 05/10/2016 1500   KETONESUR NEGATIVE 04/12/2020 1815   PROTEINUR NEGATIVE 04/12/2020 1815   UROBILINOGEN negative 05/10/2016 1500   UROBILINOGEN 0.2 01/31/2015 1505   NITRITE NEGATIVE 04/12/2020 1815   LEUKOCYTESUR NEGATIVE 04/12/2020 1815   Sepsis Labs: @LABRCNTIP (procalcitonin:4,lacticidven:4) ) Recent Results (from the past 240 hour(s))  SARS Coronavirus 2 by RT PCR (hospital order, performed in [place] hospital lab) Nasopharyngeal Nasopharyngeal Swab     Status: None   Collection Time: 04/12/20  9:07 PM   Specimen: Nasopharyngeal Swab  Result Value Ref Range Status   SARS Coronavirus 2 NEGATIVE NEGATIVE Final    Comment:  (NOTE) SARS-CoV-2 target nucleic acids are NOT DETECTED. The SARS-CoV-2 RNA is generally detectable in upper and lower respiratory specimens during the acute phase of infection. The lowest concentration of SARS-CoV-2 viral copies this assay can detect is 250 copies / mL. A negative result does not preclude SARS-CoV-2 infection and should not be used as the sole basis for treatment or other patient management decisions.  A negative result may occur with improper specimen collection / handling, submission of specimen other than nasopharyngeal swab, presence of viral mutation(s) within the areas targeted by this assay, and inadequate number of viral copies (<250 copies / mL). A negative result must be combined with clinical observations, patient history, and epidemiological information. Fact Sheet for Patients:   StrictlyIdeas.no Fact Sheet for Healthcare Providers: BankingDealers.co.za This test is not yet approved or cleared  by the [place] FDA and has been authorized for detection and/or diagnosis of SARS-CoV-2 by FDA under an Emergency Use Authorization (EUA).  This EUA will remain in effect (meaning this test can be used) for the  duration of the COVID-19 declaration under Section 564(b)(1) of the Act, 21 U.S.C. section 360bbb-3(b)(1), unless the authorization is terminated or revoked sooner. Performed at [facility], [address], [place], [place] [address]      Scheduled Meds: . diltiazem  30 mg Oral BID  . donepezil  10 mg Oral QHS  . fesoterodine  8 mg Oral Daily  . flecainide  75 mg Oral BID  . folic acid  1 mg Oral q AM  . insulin aspart  0-5 Units Subcutaneous QHS  . insulin aspart  0-9 Units Subcutaneous TID WC  . memantine  28 mg Oral q AM  . polyethylene glycol  17 g Oral BID  . rivaroxaban  20 mg Oral Q supper  . rosuvastatin  10 mg Oral QHS  . sertraline  50 mg Oral q AM  . tamsulosin  0.4 mg Oral q AM    Continuous Infusions:  Procedures/Studies: DG Chest 1 View  Result Date: 04/12/2020 CLINICAL DATA:  Multiple falls, weakness EXAM: CHEST  1 VIEW COMPARISON:  03/03/2020 FINDINGS: Single frontal view of the chest demonstrates an unremarkable cardiac silhouette. No airspace disease, effusion, or pneumothorax. No acute bony abnormalities. IMPRESSION: 1. Stable exam, no acute process. Electronically Signed   By: Randa Ngo M.D.   On: 04/12/2020 17:41   CT Head Wo Contrast  Result Date: 04/12/2020 CLINICAL DATA:  Multiple falls. EXAM: CT HEAD WITHOUT CONTRAST CT CERVICAL SPINE WITHOUT CONTRAST TECHNIQUE: Multidetector CT imaging of the head and cervical spine was performed following the standard protocol without intravenous contrast. Multiplanar CT image reconstructions of the cervical spine were also generated. COMPARISON:  Head CT 03/03/2020, CT cervical spine 03/03/2020. FINDINGS: CT HEAD FINDINGS Brain: Stable, mild generalized parenchymal atrophy. There is no acute intracranial hemorrhage. No demarcated cortical infarct. No extra-axial fluid collection. No evidence of intracranial mass. No midline shift. Vascular: No hyperdense vessel. Skull: Normal. Negative for fracture or focal lesion. Sinuses/Orbits: Visualized orbits show no acute finding. Minimal ethmoid sinus mucosal thickening. No significant mastoid effusion. CT CERVICAL SPINE FINDINGS Alignment: Redemonstrated mild C4-C5, C5-C6 and C7-T1 grade 1 anterolisthesis. Skull base and vertebrae: The basion-dental and atlanto-dental intervals are maintained.No evidence of acute fracture to the cervical spine. Soft tissues and spinal canal: No prevertebral fluid or swelling. No visible canal hematoma. Disc levels: Redemonstrated advanced cervical spondylosis with multilevel disc space narrowing, posterior disc osteophytes, uncovertebral and facet hypertrophy. As before, there is multilevel degenerative osseous fusion across the disc spaces and facet  joints. Upper chest: No consolidation within the imaged lung apices. No visible pneumothorax IMPRESSION: CT head: 1. No evidence of acute intracranial abnormality. 2. Stable, mild generalized parenchymal atrophy. 3. Minimal ethmoid sinus mucosal thickening. CT cervical spine: 1. No evidence of acute fracture to the cervical spine. 2. Redemonstrated advanced cervical spondylosis with multilevel degenerative fusion. 3. Unchanged mild grade 1 anterolisthesis at C4-C5, C5-C6 and C7-T1. Electronically Signed   By: Kellie Simmering DO   On: 04/12/2020 17:21   CT Cervical Spine Wo Contrast  Result Date: 04/12/2020 CLINICAL DATA:  Multiple falls. EXAM: CT HEAD WITHOUT CONTRAST CT CERVICAL SPINE WITHOUT CONTRAST TECHNIQUE: Multidetector CT imaging of the head and cervical spine was performed following the standard protocol without intravenous contrast. Multiplanar CT image reconstructions of the cervical spine were also generated. COMPARISON:  Head CT 03/03/2020, CT cervical spine 03/03/2020. FINDINGS: CT HEAD FINDINGS Brain: Stable, mild generalized parenchymal atrophy. There is no acute intracranial hemorrhage. No demarcated cortical infarct. No extra-axial fluid  collection. No evidence of intracranial mass. No midline shift. Vascular: No hyperdense vessel. Skull: Normal. Negative for fracture or focal lesion. Sinuses/Orbits: Visualized orbits show no acute finding. Minimal ethmoid sinus mucosal thickening. No significant mastoid effusion. CT CERVICAL SPINE FINDINGS Alignment: Redemonstrated mild C4-C5, C5-C6 and C7-T1 grade 1 anterolisthesis. Skull base and vertebrae: The basion-dental and atlanto-dental intervals are maintained.No evidence of acute fracture to the cervical spine. Soft tissues and spinal canal: No prevertebral fluid or swelling. No visible canal hematoma. Disc levels: Redemonstrated advanced cervical spondylosis with multilevel disc space narrowing, posterior disc osteophytes, uncovertebral and facet  hypertrophy. As before, there is multilevel degenerative osseous fusion across the disc spaces and facet joints. Upper chest: No consolidation within the imaged lung apices. No visible pneumothorax IMPRESSION: CT head: 1. No evidence of acute intracranial abnormality. 2. Stable, mild generalized parenchymal atrophy. 3. Minimal ethmoid sinus mucosal thickening. CT cervical spine: 1. No evidence of acute fracture to the cervical spine. 2. Redemonstrated advanced cervical spondylosis with multilevel degenerative fusion. 3. Unchanged mild grade 1 anterolisthesis at C4-C5, C5-C6 and C7-T1. Electronically Signed   By: Kellie Simmering DO   On: 04/12/2020 17:21   MR BRAIN WO CONTRAST  Result Date: 04/12/2020 CLINICAL DATA:  Initial evaluation for acute altered mental status, frequent falls. EXAM: MRI HEAD WITHOUT CONTRAST TECHNIQUE: Multiplanar, multiecho pulse sequences of the brain and surrounding structures were obtained without intravenous contrast. COMPARISON:  Prior head CT from earlier same day. FINDINGS: Brain: Examination moderately degraded by motion artifact. Generalized age-related cerebral atrophy. No significant cerebral white matter disease for patient age. No abnormal foci of restricted diffusion to suggest acute or subacute ischemia. Gray-white matter differentiation maintained. No encephalomalacia to suggest chronic cortical infarction. No foci of susceptibility artifact to suggest acute or chronic intracranial hemorrhage. No mass lesion, midline shift or mass effect. Mild ventricular prominence related to global parenchymal volume loss without hydrocephalus. No extra-axial fluid collection. Pituitary gland suprasellar region within normal limits. Midline structures intact. Vascular: Major intracranial vascular flow voids are maintained. Skull and upper cervical spine: Craniocervical junction within normal limits. Bone marrow signal intensity grossly within normal limits. No scalp soft tissue abnormality.  Sinuses/Orbits: Patient status post ocular lens replacement on the right. Globes and orbital soft tissues demonstrate no acute finding. Paranasal sinuses are largely clear. Trace right mastoid effusion noted, of doubtful significance. Other: None. IMPRESSION: 1. No acute intracranial abnormality. 2. Stable mild age-related cerebral atrophy. Electronically Signed   By: Jeannine Boga M.D.   On: 04/12/2020 19:45   DG Knee Complete 4 Views Left  Result Date: 04/12/2020 CLINICAL DATA:  Multiple falls, weakness EXAM: LEFT KNEE - COMPLETE 4+ VIEW COMPARISON:  None. FINDINGS: Frontal, bilateral oblique, lateral views of the left knee are obtained. There is a lucent area with sclerotic margins within the medial aspect of the proximal tibial diaphysis, likely benign fibrous cortical defect. No fracture, subluxation, or dislocation. There is joint space narrowing most pronounced within the medial and lateral compartments with associated chondrocalcinosis consistent with osteoarthritis. No joint effusion. IMPRESSION: 1. Osteoarthritis.  No acute bony abnormality. Electronically Signed   By: Randa Ngo M.D.   On: 04/12/2020 17:43   DG Knee Complete 4 Views Right  Result Date: 04/12/2020 CLINICAL DATA:  Multiple falls, pain EXAM: RIGHT KNEE - COMPLETE 4+ VIEW COMPARISON:  None. FINDINGS: Frontal, bilateral oblique, lateral views of the right knee are obtained. There is moderate medial and lateral compartmental joint space narrowing with chondrocalcinosis consistent with osteoarthritis. No fracture,  subluxation, or dislocation. There is a large suprapatellar joint effusion. IMPRESSION: 1. Large joint effusion. 2. Moderate medial and lateral compartmental osteoarthritis. 3. No acute displaced fracture. Electronically Signed   By: Randa Ngo M.D.   On: 04/12/2020 17:44   DG Hip Unilat W or Wo Pelvis 2-3 Views Left  Result Date: 04/12/2020 CLINICAL DATA:  Multiple falls, weakness, pain EXAM: DG HIP (WITH OR  WITHOUT PELVIS) 2-3V LEFT COMPARISON:  04/05/2020 FINDINGS: Frontal view of the pelvis as well as frontal and frogleg lateral views of the left hip are obtained. Intramedullary rod with proximal dynamic screw and distal interlocking screw traversing a prior intertrochanteric left hip fracture unchanged in position. No new bony abnormality. Stable spondylosis lower lumbar spine. Sacroiliac joints are normal. IMPRESSION: 1. Stable postoperative changes from recent left hip fracture and ORIF. 2. No new fracture. Electronically Signed   By: Randa Ngo M.D.   On: 04/12/2020 17:47   DG HIP UNILAT WITH PELVIS 2-3 VIEWS LEFT  Result Date: 04/05/2020 [facility] imaging pelvis left hip, nail noted Gamma nail left hip tip to apex distance looks good large greater trochanteric tip fracture Otherwise fracture fixation intact and fracture reduced.    Orson Eva, DO  [facility]  If 7PM-7AM, please contact night-coverage [email or web address] Password [email or web address] 04/13/2020, 10:45 AM   LOS: 0 days

## 2020-04-13 NOTE — Telephone Encounter (Signed)
He apparently got admitted to hospital early this morning

## 2020-04-13 NOTE — Plan of Care (Signed)
  Problem: Acute Rehab PT Goals(only PT should resolve) Goal: Pt Will Go Supine/Side To Sit Outcome: Progressing Flowsheets (Taken 04/13/2020 1023) Pt will go Supine/Side to Sit:  with minimal assist  with moderate assist Goal: Patient Will Transfer Sit To/From Stand Outcome: Progressing Flowsheets (Taken 04/13/2020 1023) Patient will transfer sit to/from stand:  with minimal assist  with moderate assist Goal: Pt Will Transfer Bed To Chair/Chair To Bed Outcome: Progressing Flowsheets (Taken 04/13/2020 1023) Pt will Transfer Bed to Chair/Chair to Bed:  with min assist  with mod assist Goal: Pt Will Ambulate Outcome: Progressing Flowsheets (Taken 04/13/2020 1023) Pt will Ambulate:  15 feet  with moderate assist  with rolling walker   10:24 AM, 04/13/20 Lonell Grandchild, MPT Physical Therapist with [facility] [phone] office [phone] mobile phone

## 2020-04-13 NOTE — TOC Initial Note (Signed)
Transition of Care [facility]) - Initial/Assessment Note    Patient Details  Name: Christian [name] MRN: [id number] Date of Birth: [birth year]  [facility] CM/SW Contact:    Boneta Lucks, RN Phone Number: 04/13/2020, 4:02 PM  Clinical Narrative:       Patient admitted ambulatory dysfunction. Patient was discharged from [facility] on [facility]th, used 27 days. PT is recommending PT.  [place] made there first visit  04/12/20 then patient came to ED. Wife- [name] is agreeable to SNF. [facility] will not make an offer. Wife agrees and is requesting [facility]. TOC made the referral, Elyse Hsu will review. [facility] states they should approve 7-14 days. Wife is aware of this. [place] tried to assist with filling for medicaid. Wife was not ready, She is more open now, Referral sent to Marliss Coots at [facility]. TOC to follow with bed offers.           Expected Discharge Plan: Skilled Nursing Facility Barriers to Discharge: Continued Medical Work up, SNF Pending bed offer   Patient Goals and CMS Choice Patient states their goals for this hospitalization and ongoing recovery are:: to go back to SNF CMS Medicare.gov Compare Post Acute Care list provided to:: Patient Represenative (must comment) Choice offered to / list presented to : Spouse  Expected Discharge Plan and Services Expected Discharge Plan: [place]      Prior Living Arrangements/Services   Lives with:: Spouse Patient language and need for interpreter reviewed:: Yes        Need for Family Participation in Patient Care: Yes (Comment) Care giver support system in place?: Yes (comment) Current home services: DME Criminal Activity/Legal Involvement Pertinent to Current Situation/Hospitalization: No - Comment as needed  Activities of Daily Living Home Assistive Devices/Equipment: None ADL Screening (condition at time of admission) Patient's cognitive ability adequate to safely complete daily activities?: Yes Is the  patient deaf or have difficulty hearing?: No Does the patient have difficulty seeing, even when wearing glasses/contacts?: No Does the patient have difficulty concentrating, remembering, or making decisions?: Yes Patient able to express need for assistance with ADLs?: Yes Does the patient have difficulty dressing or bathing?: Yes Independently performs ADLs?: No Does the patient have difficulty walking or climbing stairs?: No Weakness of Legs: Both Weakness of Arms/Hands: None  Permission Sought/Granted        Permission granted to share info w Relationship: wife- [name]  Permission granted to share info w Contact Information: SNF  Emotional Assessment       Orientation: : Oriented to Self, Oriented to Place, Oriented to Situation Alcohol / Substance Use: Not Applicable Psych Involvement: No (comment)  Admission diagnosis:  Generalized weakness [R53.1] Ambulatory dysfunction 123456 Acute metabolic encephalopathy [id number] Patient Active Problem List   Diagnosis Date Noted  . Falls frequently 04/13/2020  . Hyperglycemia due to diabetes mellitus ([place]) 04/13/2020  . Acute metabolic encephalopathy [id number]  . Ambulatory dysfunction 04/12/2020  . Hyperlipidemia associated with type 2 diabetes mellitus ([place]) 03/14/2020  . Acute blood loss anemia 03/14/2020  . Chronic constipation 03/14/2020  . Depression due to dementia ([place]) 03/10/2020  . Displaced intertrochanteric fracture of left femur ([place])   . S/p left hip fracture 03/03/2020  . Benign prostatic hyperplasia with lower urinary tract symptoms 06/04/2019  . Urge incontinence 06/04/2019  . History of stroke 06/02/2019  . Medicare annual wellness visit, subsequent 06/02/2019  . Urinary incontinence 10/28/2018  . Polyuria 10/28/2018  . Dermatitis 10/28/2018  . Tinea cruris  03/25/2018  . Tick bite 03/25/2018  . Balanitis 03/25/2018  . At moderate risk for fall 12/31/2017  . Alcohol abuse 12/29/2017  . History of pulmonary  embolism 12/29/2017  . Depression, recurrent ([place]) 12/29/2017  . Alcohol intoxication ([place]) 12/16/2017  . Wears hearing aid 06/09/2017  . Tinnitus of both ears 06/09/2017  . Bilateral impacted cerumen 06/09/2017  . Left wrist pain 06/26/2016  . Morning joint stiffness 06/26/2016  . Polyarthralgia 06/26/2016  . Insulin dependent type 2 diabetes mellitus, uncontrolled ([place]) 04/02/2016  . Gait disturbance 04/02/2016  . Physical deconditioning 04/02/2016  . Pain of both shoulder joints 04/02/2016  . Leg weakness, bilateral 04/02/2016  . Encounter for therapeutic drug monitoring 11/29/2015  . Encounter for health maintenance examination in adult 10/30/2015  . Shoulder pain, bilateral 10/30/2015  . Muscle weakness 10/30/2015  . Generalized weakness 10/30/2015  . Rash 10/30/2015  . PVC (premature ventricular contraction) 10/10/2015  . Decreased energy 06/20/2015  . Chronic pulmonary embolism ([place]) 06/20/2015  . DVT (deep venous thrombosis) ([place]) 06/20/2015  . Hammer toe of left foot 06/20/2015  . Pre-ulcerative calluses 06/20/2015  . Hypertrophic toenail 06/20/2015  . Diabetic mononeuropathy associated with diabetes mellitus due to underlying condition ([place]) 06/20/2015  . Leg pain, bilateral 06/20/2015  . Chronic low back pain 06/20/2015  . 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 06/14/2015  . OSA on CPAP 03/06/2015  . Edema 03/06/2015  . Leg pain, diffuse 03/06/2015  . Bilateral low back pain without sciatica 03/06/2015  . Tremor 03/06/2015  . Insomnia 03/06/2015  . Vaccine counseling 03/06/2015  . Chronic back pain 03/06/2015  . Wears glasses 03/06/2015  . Hearing loss 03/06/2015  . Spinal stenosis of lumbar region 03/06/2015  . Ptosis 03/06/2015  . Positive depression screening 03/06/2015  . Advance directive discussed with patient 03/06/2015  . Bradycardia 02/27/2015  . Current use of long term anticoagulation   . Dementia  without behavioral disturbance ([place])   . Hypertension associated with type 2 diabetes mellitus ([place]) 02/08/2015  . Hyperlipidemia 02/08/2015  . OSA (obstructive sleep apnea) 02/08/2015  . Memory loss 03/12/2013  . Diabetes mellitus ([place]) 03/12/2013   PCP:  Carlena Hurl, PA-C Pharmacy:   [facility] [place], [place] - [address] [place] [address] [address] [place]  [address] Phone: [phone] Fax: [phone]

## 2020-04-13 NOTE — H&P (Signed)
History and Physical    Christian [name] [id number] DOB: 1941/08/19 DOA: 04/12/2020  PCP: Carlena Hurl, PA-C (Confirm with patient/family/NH records and if not entered, this has to be entered at [facility] point of entry) Patient coming from: Home  I have personally briefly reviewed patient's old medical records in [place]  Chief Complaint: Generalized weakness with ambulatory dysfunction  HPI: Christian [name] is a 79 y.o. male with medical history significant of hypertension, hyperlipidemia, dementia, diabetes mellitus and obstructive sleep apnea scented to ED for evaluation of severe generalized weakness and frequent recent falls.  Patient is a poor historian most likely secondary to underlying dementia but able to answer the review of systems questions appropriately therefore history is gathered from the ED physician.  According to the ED physician, patient's wife reported that patient was just discharged from any [facility] nursing home 4 days ago and since then patient is having severe generalized weakness and unable to stand and ambulate.  Patient's wife also reported to the ED physician that patient has a wheelchair and a walker that he uses for ambulation at baseline but he is unable to use his walker for ambulation.  Patient has a history of dementia at baseline but he is more confused than his baseline.  Patient admits of having some neck pain and bilateral leg pain otherwise he denies fever, chills, chest pain, shortness of breath, nausea, vomiting, abdominal pain and urinary symptoms.  ED physician also spoke to the staff from nursing home and it was reported that patient was able to ambulate with the help of a walker.  The nursing home staff also reported that patient does have some baseline confusion but his mental status was improving upon discharge.  ED Course: On arrival to the ED patient had temperature of 98.4, blood pressure 136/68, heart rate 76, respiratory rate 18 and oxygen  saturation 97% on room air.  Blood work showed WBC 8.8, hemoglobin 11.5, sodium 138, potassium 4.5, BUN 16, creatinine 0.61 and blood glucose 191.  UA negative for UTI CT head negative for acute intracranial pathology but showed chronic atrophic changes.  CT cervical spine negative for acute fracture to cervical spine showed unchanged grade 1 anterolisthesis at C4-C5, C5-C6 and C7-T1.  MRI brain without contrast is also negative for acute intracranial pathology.  Chest x-ray negative for acute cardiopulmonary problem.  X-ray of left hip showed stable postoperative changes from recent left hip fracture and no new fracture.  X-ray left knee showed osteoarthritis but no acute fracture or dislocation.  X-ray right knee showed large joint effusion with moderate medial and lateral compartmental osteoarthritis.  Patient was given Tylenol in the ED.  Review of Systems: As per HPI otherwise 10 point review of systems negative.  Unacceptable ROS statements: 10 systems reviewed, Extensive (without elaboration).  Acceptable ROS statements: All others negative, All others reviewed and are negative, and All others unremarkable, with at [place] documented Cant double dip - if using for HPI cant use for ROS  Past Medical History:  Diagnosis Date   Agitation    Arthritis    Cataract    Chronic back pain    Confusion    Diabetes ([place])    type 2   DVT (deep venous thrombosis) ([place]) 01/2015   Hearing impaired    High cholesterol    Hypertension    Insomnia    Insomnia    Leg pain, diffuse    Leg swelling    Memory loss  Mild cognitive impairment    sees [facility]   Numbness    fingers, feet, toes   OSA on CPAP    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 06/14/2015   Pulmonary embolism ([place]) 01/2015   Spinal stenosis    lumbar   Tremor    on propranolol   Wears glasses     Past Surgical History:   Procedure Laterality Date   CATARACT EXTRACTION     COLONOSCOPY  2014   INTRAMEDULLARY (IM) NAIL INTERTROCHANTERIC Left 03/05/2020   Procedure: INTRAMEDULLARY (IM) NAIL INTERTROCHANTRIC;  Surgeon: Carole Civil, MD;  Location: [facility];  Service: Orthopedics;  Laterality: Left;   TONSILLECTOMY       reports that he quit smoking about 15 years ago. His smoking use included cigarettes. He has a 10.00 pack-year smoking history. He has never used smokeless tobacco. He reports current alcohol use of about 1.0 standard drinks of alcohol per week. He reports previous drug use.  Allergies  Allergen Reactions   Bee Venom Swelling   Ultram [Tramadol Hcl]     Makes him wired, gives insomnia   Oxycodone Other (See Comments)    Mental status changes   Oxycontin [Oxycodone Hcl] Other (See Comments)    Mental status changes per pt    Family History  Problem Relation Age of Onset   Dementia Father    Clotting disorder Mother    Heart disease Brother    Clotting disorder Brother    Psychiatric Illness Sister    Leukemia Sister     Unacceptable: Noncontributory, unremarkable, or negative. Acceptable: (example)Family history negative for heart disease  Prior to Admission medications   Medication Sig Start Date End Date Taking? Authorizing Provider  acetaminophen (TYLENOL) 325 MG tablet Take 650 mg by mouth every 6 (six) hours as needed for mild pain or moderate pain.   Yes [provider]  diltiazem (CARDIZEM) 30 MG tablet TAKE 1 TABLET(30 MG) BY MOUTH TWICE DAILY Patient taking differently: Take 30 mg by mouth 2 (two) times daily.  04/06/20  Yes Gerlene Fee, NP  donepezil (ARICEPT) 10 MG tablet Take 1 tablet (10 mg) by mouth at bedtime. 04/06/20  Yes Gerlene Fee, NP  flecainide (TAMBOCOR) 150 MG tablet TAKE 1/2 TABLET(75 MG) BY MOUTH TWICE DAILY Patient taking differently: Take 75 mg by mouth 2 (two) times daily.  04/06/20  Yes Gerlene Fee, NP  folic  acid (FOLVITE) 1 MG tablet Take 1 mg daily Patient taking differently: Take 1 mg by mouth in the morning.  04/06/20  Yes Gerlene Fee, NP  furosemide (LASIX) 40 MG tablet Take 40 mg daily AS NEEDED  For swelling. Patient taking differently: Take 40 mg by mouth daily as needed for fluid.  04/06/20  Yes Gerlene Fee, NP  insulin degludec (TRESIBA FLEXTOUCH) 100 UNIT/ML FlexTouch Pen Inject 0.46 mLs (46 Units total) into the skin daily. Patient taking differently: Inject 41 Units into the skin at bedtime.  04/06/20  Yes Gerlene Fee, NP  memantine (NAMENDA XR) 28 MG CP24 24 hr capsule TAKE 1 CAPSULE(28 MG) BY MOUTH DAILY Patient taking differently: Take 28 mg by mouth in the morning.  04/06/20  Yes Gerlene Fee, NP  metFORMIN (GLUCOPHAGE) 850 MG tablet TAKE 1 TABLET(850 MG) BY MOUTH TWICE DAILY WITH A MEAL Patient taking differently: Take 850 mg by mouth 2 (two) times daily with a meal.  04/06/20  Yes Ok Edwards  S, NP  Misc Natural Products (OSTEO BI-FLEX ADV JOINT SHIELD PO) Take 2 tablets by mouth daily.    Yes [provider]  naproxen sodium (ALEVE) 220 MG tablet Take 220 mg by mouth daily as needed (for pain).    Yes [provider]  polyethylene glycol (MIRALAX / GLYCOLAX) 17 g packet Take 17 g by mouth 2 (two) times daily. 03/10/20  Yes [provider]  rivaroxaban (XARELTO) 20 MG TABS tablet TAKE 1 TABLET BY MOUTH EVERY DAY WITH SUPPER Patient taking differently: Take 20 mg by mouth daily with supper.  04/06/20  Yes Gerlene Fee, NP  rosuvastatin (CRESTOR) 10 MG tablet TAKE 1 TABLET(10 MG) BY MOUTH AT BEDTIME Patient taking differently: Take 10 mg by mouth at bedtime.  04/06/20  Yes Gerlene Fee, NP  sertraline (ZOLOFT) 50 MG tablet Take 1 tablet (50 mg total) by mouth daily. Patient taking differently: Take 50 mg by mouth in the morning.  04/06/20  Yes Gerlene Fee, NP  tamsulosin (FLOMAX) 0.4 MG CAPS capsule TAKE 1 CAPSULE(0.4 MG) BY MOUTH  DAILY Patient taking differently: Take 0.4 mg by mouth in the morning.  04/06/20  Yes Gerlene Fee, NP  tolterodine (DETROL LA) 4 MG 24 hr capsule Take 1 capsule (4 mg total) by mouth at bedtime. 04/06/20  Yes Gerlene Fee, NP  docusate sodium (COLACE) 100 MG capsule Take 1 capsule (100 mg total) by mouth 2 (two) times daily. Patient not taking: Reported on 04/12/2020 03/07/20   Manuella Ghazi, Pratik D, DO  insulin lispro (HUMALOG KWIKPEN) 100 UNIT/ML KwikPen Inject 0.05 mLs (5 Units total) into the skin 3 (three) times daily with meals. For cbg > 150 Patient not taking: Reported on 04/12/2020 04/06/20   Gerlene Fee, NP    Physical Exam: Vitals:   04/12/20 2213 04/12/20 2218 04/12/20 2242 04/13/20 0256  BP: 134/64 130/69 (!) 150/71 132/66  Pulse: 72 73 71 71  Resp: 16 16 20 18   Temp: 98 F (36.7 C) 98.5 F (36.9 C) 97.6 F (36.4 C) 98.3 F (36.8 C)  TempSrc: Oral Oral Oral Oral  SpO2: 96% 96% 94% 98%  Weight:      Height:        Constitutional: NAD, calm, comfortable Vitals:   04/12/20 2213 04/12/20 2218 04/12/20 2242 04/13/20 0256  BP: 134/64 130/69 (!) 150/71 132/66  Pulse: 72 73 71 71  Resp: 16 16 20 18   Temp: 98 F (36.7 C) 98.5 F (36.9 C) 97.6 F (36.4 C) 98.3 F (36.8 C)  TempSrc: Oral Oral Oral Oral  SpO2: 96% 96% 94% 98%  Weight:      Height:        General: 79 year old Caucasian male in no acute distress. Eyes: PERRL, lids and conjunctivae normal ENMT: Mucous membranes are moist. Posterior pharynx clear of any exudate or lesions.Normal dentition.  Neck: normal, supple, no masses, no thyromegaly Respiratory: clear to auscultation bilaterally, no wheezing, no crackles. Normal respiratory effort. No accessory muscle use.  Cardiovascular: Regular rate and rhythm, no murmurs / rubs / gallops. No extremity edema. 2+ pedal pulses. No carotid bruits.  Abdomen: no tenderness, no masses palpated. No hepatosplenomegaly. Bowel sounds positive.  Musculoskeletal: Left hip  pain with passive range of motion superficial abrasions to left knee. Skin: Superficial abrasions to left knee Neurologic: Patient is pleasantly demented and not in acute distress.  Patient is awake, alert and oriented to place but not to person and time. Psychiatric: Poor judgment  secondary to underlying dementia.  Awake, alert alert and oriented to place but not to person and time. Normal mood.   (Anything < 9 systems with 2 bullets each down codes to level 1) (If patient refuses exam cant bill higher level) (Make sure to document decubitus ulcers present on admission -- if possible -- and whether patient has chronic indwelling catheter at time of admission)  Labs on Admission: I have personally reviewed following labs and imaging studies  CBC: Recent Labs  Lab 04/12/20 1608  WBC 8.8  NEUTROABS 6.8  HGB 11.5*  HCT 38.4*  MCV 98.2  PLT [id number]   Basic Metabolic Panel: Recent Labs  Lab 04/12/20 1608  NA 138  K 4.5  CL 104  CO2 24  GLUCOSE 191*  BUN 16  CREATININE 0.61  CALCIUM 9.0   GFR: Estimated Creatinine Clearance: 98.3 mL/min (by C-G formula based on SCr of 0.61 mg/dL). Liver Function Tests: Recent Labs  Lab 04/12/20 1608  AST 20  ALT 19  ALKPHOS 153*  BILITOT 0.5  PROT 7.1  ALBUMIN 3.2*   No results for input(s): LIPASE, AMYLASE in the last 168 hours. No results for input(s): AMMONIA in the last 168 hours. Coagulation Profile: No results for input(s): INR, PROTIME in the last 168 hours. Cardiac Enzymes: No results for input(s): CKTOTAL, CKMB, CKMBINDEX, TROPONINI in the last 168 hours. BNP (last 3 results) No results for input(s): PROBNP in the last 8760 hours. HbA1C: No results for input(s): HGBA1C in the last 72 hours. CBG: Recent Labs  Lab 04/12/20 2336  GLUCAP 130*   Lipid Profile: No results for input(s): CHOL, HDL, LDLCALC, TRIG, CHOLHDL, LDLDIRECT in the last 72 hours. Thyroid Function Tests: No results for input(s): TSH, T4TOTAL, FREET4,  T3FREE, THYROIDAB in the last 72 hours. Anemia Panel: No results for input(s): VITAMINB12, FOLATE, FERRITIN, TIBC, IRON, RETICCTPCT in the last 72 hours. Urine analysis:    Component Value Date/Time   COLORURINE YELLOW 04/12/2020 1815   APPEARANCEUR CLEAR 04/12/2020 1815   LABSPEC 1.028 04/12/2020 1815   LABSPEC 1.015 10/28/2018 1522   PHURINE 5.0 04/12/2020 1815   GLUCOSEU >=500 (A) 04/12/2020 1815   HGBUR NEGATIVE 04/12/2020 1815   BILIRUBINUR NEGATIVE 04/12/2020 1815   BILIRUBINUR negative 10/28/2018 1522   BILIRUBINUR n 05/10/2016 1500   KETONESUR NEGATIVE 04/12/2020 1815   PROTEINUR NEGATIVE 04/12/2020 1815   UROBILINOGEN negative 05/10/2016 1500   UROBILINOGEN 0.2 01/31/2015 1505   NITRITE NEGATIVE 04/12/2020 1815   LEUKOCYTESUR NEGATIVE 04/12/2020 1815    Radiological Exams on Admission: DG Chest 1 View  Result Date: 04/12/2020 CLINICAL DATA:  Multiple falls, weakness EXAM: CHEST  1 VIEW COMPARISON:  03/03/2020 FINDINGS: Single frontal view of the chest demonstrates an unremarkable cardiac silhouette. No airspace disease, effusion, or pneumothorax. No acute bony abnormalities. IMPRESSION: 1. Stable exam, no acute process. Electronically Signed   By: Randa Ngo M.D.   On: 04/12/2020 17:41   CT Head Wo Contrast  Result Date: 04/12/2020 CLINICAL DATA:  Multiple falls. EXAM: CT HEAD WITHOUT CONTRAST CT CERVICAL SPINE WITHOUT CONTRAST TECHNIQUE: Multidetector CT imaging of the head and cervical spine was performed following the standard protocol without intravenous contrast. Multiplanar CT image reconstructions of the cervical spine were also generated. COMPARISON:  Head CT 03/03/2020, CT cervical spine 03/03/2020. FINDINGS: CT HEAD FINDINGS Brain: Stable, mild generalized parenchymal atrophy. There is no acute intracranial hemorrhage. No demarcated cortical infarct. No extra-axial fluid collection. No evidence of intracranial mass. No midline shift. Vascular: No  hyperdense  vessel. Skull: Normal. Negative for fracture or focal lesion. Sinuses/Orbits: Visualized orbits show no acute finding. Minimal ethmoid sinus mucosal thickening. No significant mastoid effusion. CT CERVICAL SPINE FINDINGS Alignment: Redemonstrated mild C4-C5, C5-C6 and C7-T1 grade 1 anterolisthesis. Skull base and vertebrae: The basion-dental and atlanto-dental intervals are maintained.No evidence of acute fracture to the cervical spine. Soft tissues and spinal canal: No prevertebral fluid or swelling. No visible canal hematoma. Disc levels: Redemonstrated advanced cervical spondylosis with multilevel disc space narrowing, posterior disc osteophytes, uncovertebral and facet hypertrophy. As before, there is multilevel degenerative osseous fusion across the disc spaces and facet joints. Upper chest: No consolidation within the imaged lung apices. No visible pneumothorax IMPRESSION: CT head: 1. No evidence of acute intracranial abnormality. 2. Stable, mild generalized parenchymal atrophy. 3. Minimal ethmoid sinus mucosal thickening. CT cervical spine: 1. No evidence of acute fracture to the cervical spine. 2. Redemonstrated advanced cervical spondylosis with multilevel degenerative fusion. 3. Unchanged mild grade 1 anterolisthesis at C4-C5, C5-C6 and C7-T1. Electronically Signed   By: Kellie Simmering DO   On: 04/12/2020 17:21   CT Cervical Spine Wo Contrast  Result Date: 04/12/2020 CLINICAL DATA:  Multiple falls. EXAM: CT HEAD WITHOUT CONTRAST CT CERVICAL SPINE WITHOUT CONTRAST TECHNIQUE: Multidetector CT imaging of the head and cervical spine was performed following the standard protocol without intravenous contrast. Multiplanar CT image reconstructions of the cervical spine were also generated. COMPARISON:  Head CT 03/03/2020, CT cervical spine 03/03/2020. FINDINGS: CT HEAD FINDINGS Brain: Stable, mild generalized parenchymal atrophy. There is no acute intracranial hemorrhage. No demarcated cortical infarct. No  extra-axial fluid collection. No evidence of intracranial mass. No midline shift. Vascular: No hyperdense vessel. Skull: Normal. Negative for fracture or focal lesion. Sinuses/Orbits: Visualized orbits show no acute finding. Minimal ethmoid sinus mucosal thickening. No significant mastoid effusion. CT CERVICAL SPINE FINDINGS Alignment: Redemonstrated mild C4-C5, C5-C6 and C7-T1 grade 1 anterolisthesis. Skull base and vertebrae: The basion-dental and atlanto-dental intervals are maintained.No evidence of acute fracture to the cervical spine. Soft tissues and spinal canal: No prevertebral fluid or swelling. No visible canal hematoma. Disc levels: Redemonstrated advanced cervical spondylosis with multilevel disc space narrowing, posterior disc osteophytes, uncovertebral and facet hypertrophy. As before, there is multilevel degenerative osseous fusion across the disc spaces and facet joints. Upper chest: No consolidation within the imaged lung apices. No visible pneumothorax IMPRESSION: CT head: 1. No evidence of acute intracranial abnormality. 2. Stable, mild generalized parenchymal atrophy. 3. Minimal ethmoid sinus mucosal thickening. CT cervical spine: 1. No evidence of acute fracture to the cervical spine. 2. Redemonstrated advanced cervical spondylosis with multilevel degenerative fusion. 3. Unchanged mild grade 1 anterolisthesis at C4-C5, C5-C6 and C7-T1. Electronically Signed   By: Kellie Simmering DO   On: 04/12/2020 17:21   MR BRAIN WO CONTRAST  Result Date: 04/12/2020 CLINICAL DATA:  Initial evaluation for acute altered mental status, frequent falls. EXAM: MRI HEAD WITHOUT CONTRAST TECHNIQUE: Multiplanar, multiecho pulse sequences of the brain and surrounding structures were obtained without intravenous contrast. COMPARISON:  Prior head CT from earlier same day. FINDINGS: Brain: Examination moderately degraded by motion artifact. Generalized age-related cerebral atrophy. No significant cerebral white matter  disease for patient age. No abnormal foci of restricted diffusion to suggest acute or subacute ischemia. Gray-white matter differentiation maintained. No encephalomalacia to suggest chronic cortical infarction. No foci of susceptibility artifact to suggest acute or chronic intracranial hemorrhage. No mass lesion, midline shift or mass effect. Mild ventricular prominence related to global parenchymal volume  loss without hydrocephalus. No extra-axial fluid collection. Pituitary gland suprasellar region within normal limits. Midline structures intact. Vascular: Major intracranial vascular flow voids are maintained. Skull and upper cervical spine: Craniocervical junction within normal limits. Bone marrow signal intensity grossly within normal limits. No scalp soft tissue abnormality. Sinuses/Orbits: Patient status post ocular lens replacement on the right. Globes and orbital soft tissues demonstrate no acute finding. Paranasal sinuses are largely clear. Trace right mastoid effusion noted, of doubtful significance. Other: None. IMPRESSION: 1. No acute intracranial abnormality. 2. Stable mild age-related cerebral atrophy. Electronically Signed   By: Jeannine Boga M.D.   On: 04/12/2020 19:45   DG Knee Complete 4 Views Left  Result Date: 04/12/2020 CLINICAL DATA:  Multiple falls, weakness EXAM: LEFT KNEE - COMPLETE 4+ VIEW COMPARISON:  None. FINDINGS: Frontal, bilateral oblique, lateral views of the left knee are obtained. There is a lucent area with sclerotic margins within the medial aspect of the proximal tibial diaphysis, likely benign fibrous cortical defect. No fracture, subluxation, or dislocation. There is joint space narrowing most pronounced within the medial and lateral compartments with associated chondrocalcinosis consistent with osteoarthritis. No joint effusion. IMPRESSION: 1. Osteoarthritis.  No acute bony abnormality. Electronically Signed   By: Randa Ngo M.D.   On: 04/12/2020 17:43   DG  Knee Complete 4 Views Right  Result Date: 04/12/2020 CLINICAL DATA:  Multiple falls, pain EXAM: RIGHT KNEE - COMPLETE 4+ VIEW COMPARISON:  None. FINDINGS: Frontal, bilateral oblique, lateral views of the right knee are obtained. There is moderate medial and lateral compartmental joint space narrowing with chondrocalcinosis consistent with osteoarthritis. No fracture, subluxation, or dislocation. There is a large suprapatellar joint effusion. IMPRESSION: 1. Large joint effusion. 2. Moderate medial and lateral compartmental osteoarthritis. 3. No acute displaced fracture. Electronically Signed   By: Randa Ngo M.D.   On: 04/12/2020 17:44   DG Hip Unilat W or Wo Pelvis 2-3 Views Left  Result Date: 04/12/2020 CLINICAL DATA:  Multiple falls, weakness, pain EXAM: DG HIP (WITH OR WITHOUT PELVIS) 2-3V LEFT COMPARISON:  04/05/2020 FINDINGS: Frontal view of the pelvis as well as frontal and frogleg lateral views of the left hip are obtained. Intramedullary rod with proximal dynamic screw and distal interlocking screw traversing a prior intertrochanteric left hip fracture unchanged in position. No new bony abnormality. Stable spondylosis lower lumbar spine. Sacroiliac joints are normal. IMPRESSION: 1. Stable postoperative changes from recent left hip fracture and ORIF. 2. No new fracture. Electronically Signed   By: Randa Ngo M.D.   On: 04/12/2020 17:47      Assessment/Plan Principal Problem:   Ambulatory dysfunction Patient is originally brought to the hospital for recent ambulatory dysfunction with severe generalized weakness. Patient also had multiple falls recently.  CT head and CT cervical spine without contrast was done that was negative for acute pathology. X-ray left hip and bilateral knee also done that was negative for acute fracture or dislocation. Physical therapy/Occupational Therapy consult ordered for their recommendations.  Active Problems: Acute confusion in the setting of  dementia. Patient is not at his baseline according to his wife and very confused since he came back from nursing home. CT head negative for acute intracranial pathology. Patient has baseline dementia but more confused from his baseline as per patient's wife. Continue to monitor    Falls frequently Patient fell 3-4 times in the last 4 days secondary to severe generalized weakness. PT OT consult ordered and patient will most probably need more physical therapy at home or  skilled nursing facility.  Will appreciate  PT recommendations.    Hyperglycemia due to diabetes mellitus ([place]) Blood glucose is 191 on arrival to the hospital. Low-dose sliding scale insulin ordered. Blood glucose monitoring and hypoglycemic protocol in place.  Atrial fibrillation Patient most probably has paroxysmal atrial fibrillation. Continue home diltiazem and Xarelto  Hypertension Blood pressure is stable. Continue home medications  Hyperlipidemia Home medication ordered  Dementia Continue home medications  DVT prophylaxis: Continue home Xarelto Code Status: DNR Family Communication: No family member present this time. Disposition Plan: Yet to be decided Consults called: PT/OT consult ordered Admission status: Observation/MedSurg   Edmonia Lynch MD [facility] Pager [phone]   If 7PM-7AM, please contact night-coverage [email or web address] Password  04/13/2020, 4:27 AM

## 2020-04-14 DIAGNOSIS — G9341 Metabolic encephalopathy: Principal | ICD-10-CM

## 2020-04-14 DIAGNOSIS — W19XXXA Unspecified fall, initial encounter: Secondary | ICD-10-CM

## 2020-04-14 DIAGNOSIS — M25461 Effusion, right knee: Secondary | ICD-10-CM

## 2020-04-14 LAB — GLUCOSE, CAPILLARY
Glucose-Capillary: 245 mg/dL — ABNORMAL HIGH (ref 70–99)
Glucose-Capillary: 306 mg/dL — ABNORMAL HIGH (ref 70–99)

## 2020-04-14 MED ORDER — VITAMIN B-12 100 MCG PO TABS: 500.0000 ug | ORAL_TABLET | Freq: Every day | ORAL | Status: DC

## 2020-04-14 MED ORDER — CYANOCOBALAMIN 500 MCG PO TABS: 500.0000 ug | ORAL_TABLET | Freq: Every day | ORAL | Status: AC

## 2020-04-14 MED ORDER — CYANOCOBALAMIN 1000 MCG/ML IJ SOLN
1000.0000 ug | Freq: Once | INTRAMUSCULAR | Status: AC
  Administered 2020-04-14: 1000 ug via INTRAMUSCULAR
  Filled 2020-04-14: qty 1

## 2020-04-14 NOTE — Care Management Important Message (Signed)
Important Message  Patient Details  Name: Christian [name] MRN: [id number] Date of Birth: 02-Aug-1941   Medicare Important Message Given:  Yes     [name] 04/14/2020, 11:40 AM

## 2020-04-14 NOTE — Consult Note (Addendum)
Reason for Consult: Right knee effusion Referring Physician: Orson Eva, MD  Christian [name] is an 79 y.o. male.  HPI: 79 year old male status post internal fixation left hip for fracture went home from nursing home and then fell.  Patient had a right knee effusion Dr. Carles Collet wanted him assessed for possible need for aspiration.  Patient complains of no pain in the knee has not lost any motion in his knee.  He has no bruising or ecchymosis around the joint  04/12/2020 injury date  Currently on Xarelto  Past Medical History:  Diagnosis Date  . Agitation   . Arthritis   . Cataract   . Chronic back pain   . Confusion   . Diabetes ([place])    type 2  . DVT (deep venous thrombosis) ([place]) 01/2015  . Hearing impaired   . High cholesterol   . Hypertension   . Insomnia   . Insomnia   . Leg pain, diffuse   . Leg swelling   . Memory loss   . Mild cognitive impairment    sees [facility]  . Numbness    fingers, feet, toes  . OSA on CPAP   . 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 06/14/2015  . Pulmonary embolism ([place]) 01/2015  . Spinal stenosis    lumbar  . Tremor    on propranolol  . Wears glasses     Past Surgical History:  Procedure Laterality Date  . CATARACT EXTRACTION    . COLONOSCOPY  2014  . INTRAMEDULLARY (IM) NAIL INTERTROCHANTERIC Left 03/05/2020   Procedure: INTRAMEDULLARY (IM) NAIL INTERTROCHANTRIC;  Surgeon: Carole Civil, MD;  Location: [facility];  Service: Orthopedics;  Laterality: Left;  . TONSILLECTOMY      Family History  Problem Relation Age of Onset  . Dementia Father   . Clotting disorder Mother   . Heart disease Brother   . Clotting disorder Brother   . Psychiatric Illness Sister   . Leukemia Sister     Social History:  reports that he quit smoking about 15 years ago. His smoking use included cigarettes. He has a 10.00 pack-year smoking history. He has never used smokeless tobacco. He reports current  alcohol use of about 1.0 standard drinks of alcohol per week. He reports previous drug use.  Allergies:  Allergies  Allergen Reactions  . Bee Venom Swelling  . Ultram [Tramadol Hcl]     Makes him wired, gives insomnia  . Oxycodone Other (See Comments)    Mental status changes  . Oxycontin [Oxycodone Hcl] Other (See Comments)    Mental status changes per pt  . Zoloft [Sertraline Hcl] Other (See Comments)    Increases confusion per wife     Results for orders placed or performed during the hospital encounter of 04/12/20 (from the past 48 hour(s))  CBC with Differential     Status: Abnormal   Collection Time: 04/12/20  4:08 PM  Result Value Ref Range   WBC 8.8 4.0 - 10.5 K/uL   RBC 3.91 (L) 4.22 - 5.81 MIL/uL   Hemoglobin 11.5 (L) 13.0 - 17.0 g/dL   HCT 38.4 (L) 39.0 - 52.0 %   MCV 98.2 80.0 - 100.0 fL   MCH 29.4 26.0 - 34.0 pg   MCHC 29.9 (L) 30.0 - 36.0 g/dL   RDW 13.2 11.5 - 15.5 %   Platelets 231 150 - 400 K/uL   nRBC 0.0 0.0 - 0.2 %   Neutrophils Relative %  77 %   Neutro Abs 6.8 1.7 - 7.7 K/uL   Lymphocytes Relative 10 %   Lymphs Abs 0.9 0.7 - 4.0 K/uL   Monocytes Relative 10 %   Monocytes Absolute 0.9 0.1 - 1.0 K/uL   Eosinophils Relative 2 %   Eosinophils Absolute 0.2 0.0 - 0.5 K/uL   Basophils Relative 1 %   Basophils Absolute 0.1 0.0 - 0.1 K/uL   Immature Granulocytes 0 %   Abs Immature Granulocytes 0.02 0.00 - 0.07 K/uL    Comment: Performed at [facility], [address] [place], [place] [address]  Comprehensive metabolic panel     Status: Abnormal   Collection Time: 04/12/20  4:08 PM  Result Value Ref Range   Sodium 138 135 - 145 mmol/L   Potassium 4.5 3.5 - 5.1 mmol/L   Chloride 104 98 - 111 mmol/L   CO2 24 22 - 32 mmol/L   Glucose, Bld 191 (H) 70 - 99 mg/dL    Comment: Glucose reference range applies only to samples taken after fasting for at least 8 hours.   BUN 16 8 - 23 mg/dL   Creatinine, Ser 0.61 0.61 - 1.24 mg/dL   Calcium 9.0 8.9 - 10.3 mg/dL    Total Protein 7.1 6.5 - 8.1 g/dL   Albumin 3.2 (L) 3.5 - 5.0 g/dL   AST 20 15 - 41 U/L   ALT 19 0 - 44 U/L   Alkaline Phosphatase 153 (H) 38 - 126 U/L   Total Bilirubin 0.5 0.3 - 1.2 mg/dL   GFR calc non Af Amer >60 >60 mL/min   GFR calc Af Amer >60 >60 mL/min   Anion gap 10 5 - 15    Comment: Performed at [facility], [address], [place], [place] [address]  T4, free     Status: None   Collection Time: 04/12/20  4:08 PM  Result Value Ref Range   Free T4 1.12 0.61 - 1.12 ng/dL    Comment: (NOTE) Biotin ingestion may interfere with free T4 tests. If the results are inconsistent with the TSH level, previous test results, or the clinical presentation, then consider biotin interference. If needed, order repeat testing after stopping biotin. Performed at [facility], [place] [address] [place], [place] [address]   TSH     Status: None   Collection Time: 04/12/20  4:08 PM  Result Value Ref Range   TSH 0.939 0.350 - 4.500 uIU/mL    Comment: Performed by a 3rd Generation assay with a functional sensitivity of <=0.01 uIU/mL. Performed at [facility], [address] [place], [place] [address]   Folate     Status: None   Collection Time: 04/12/20  4:09 PM  Result Value Ref Range   Folate 29.9 >5.9 ng/mL    Comment: RESULTS CONFIRMED BY MANUAL DILUTION Performed at [facility], [address] [place], [place] [address]   Vitamin B12     Status: Abnormal   Collection Time: 04/12/20  4:09 PM  Result Value Ref Range   Vitamin B-12 175 (L) 180 - 914 pg/mL    Comment: (NOTE) This assay is not validated for testing neonatal or myeloproliferative syndrome specimens for Vitamin B12 levels. Performed at [facility], [address] [place],  [address]   Urinalysis, Routine w reflex microscopic     Status: Abnormal   Collection Time: 04/12/20  6:15 PM  Result Value Ref Range   Color, Urine YELLOW YELLOW   APPearance CLEAR CLEAR  Specific Gravity, Urine 1.028 1.005 -  1.030   pH 5.0 5.0 - 8.0   Glucose, UA >=500 (A) NEGATIVE mg/dL   Hgb urine dipstick NEGATIVE NEGATIVE   Bilirubin Urine NEGATIVE NEGATIVE   Ketones, ur NEGATIVE NEGATIVE mg/dL   Protein, ur NEGATIVE NEGATIVE mg/dL   Nitrite NEGATIVE NEGATIVE   Leukocytes,Ua NEGATIVE NEGATIVE   RBC / HPF 0-5 0 - 5 RBC/hpf   WBC, UA 0-5 0 - 5 WBC/hpf   Bacteria, UA NONE SEEN NONE SEEN   Squamous Epithelial / LPF 0-5 0 - 5   Mucus PRESENT     Comment: Performed at [facility], [address], [place], [place] [address]  SARS Coronavirus 2 by RT PCR (hospital order, performed in [place] hospital lab) Nasopharyngeal Nasopharyngeal Swab     Status: None   Collection Time: 04/12/20  9:07 PM   Specimen: Nasopharyngeal Swab  Result Value Ref Range   SARS Coronavirus 2 NEGATIVE NEGATIVE    Comment: (NOTE) SARS-CoV-2 target nucleic acids are NOT DETECTED. The SARS-CoV-2 RNA is generally detectable in upper and lower respiratory specimens during the acute phase of infection. The lowest concentration of SARS-CoV-2 viral copies this assay can detect is 250 copies / mL. A negative result does not preclude SARS-CoV-2 infection and should not be used as the sole basis for treatment or other patient management decisions.  A negative result may occur with improper specimen collection / handling, submission of specimen other than nasopharyngeal swab, presence of viral mutation(s) within the areas targeted by this assay, and inadequate number of viral copies (<250 copies / mL). A negative result must be combined with clinical observations, patient history, and epidemiological information. Fact Sheet for Patients:   StrictlyIdeas.no Fact Sheet for Healthcare Providers: BankingDealers.co.za This test is not yet approved or cleared  by the [place] FDA and has been authorized for detection and/or diagnosis of SARS-CoV-2 by FDA under an Emergency Use Authorization  (EUA).  This EUA will remain in effect (meaning this test can be used) for the duration of the COVID-19 declaration under Section 564(b)(1) of the Act, 21 U.S.C. section 360bbb-3(b)(1), unless the authorization is terminated or revoked sooner. Performed at [facility], [address], [place],  [address]   Glucose, capillary     Status: Abnormal   Collection Time: 04/12/20 11:36 PM  Result Value Ref Range   Glucose-Capillary 130 (H) 70 - 99 mg/dL    Comment: Glucose reference range applies only to samples taken after fasting for at least 8 hours.  CBC     Status: Abnormal   Collection Time: 04/13/20  6:10 AM  Result Value Ref Range   WBC 6.0 4.0 - 10.5 K/uL   RBC 4.07 (L) 4.22 - 5.81 MIL/uL   Hemoglobin 12.0 (L) 13.0 - 17.0 g/dL   HCT 39.2 39.0 - 52.0 %   MCV 96.3 80.0 - 100.0 fL   MCH 29.5 26.0 - 34.0 pg   MCHC 30.6 30.0 - 36.0 g/dL   RDW 13.0 11.5 - 15.5 %   Platelets 241 150 - 400 K/uL   nRBC 0.0 0.0 - 0.2 %    Comment: Performed at [facility], [address] [place],  [address]  Comprehensive metabolic panel     Status: Abnormal   Collection Time: 04/13/20  6:10 AM  Result Value Ref Range   Sodium 142 135 - 145 mmol/L   Potassium 4.1 3.5 - 5.1 mmol/L   Chloride 106 98 - 111 mmol/L  CO2 25 22 - 32 mmol/L   Glucose, Bld 154 (H) 70 - 99 mg/dL    Comment: Glucose reference range applies only to samples taken after fasting for at least 8 hours.   BUN 13 8 - 23 mg/dL   Creatinine, Ser 0.55 (L) 0.61 - 1.24 mg/dL   Calcium 9.1 8.9 - 10.3 mg/dL   Total Protein 6.2 (L) 6.5 - 8.1 g/dL   Albumin 2.9 (L) 3.5 - 5.0 g/dL   AST 14 (L) 15 - 41 U/L   ALT 16 0 - 44 U/L   Alkaline Phosphatase 128 (H) 38 - 126 U/L   Total Bilirubin 0.4 0.3 - 1.2 mg/dL   GFR calc non Af Amer >60 >60 mL/min   GFR calc Af Amer >60 >60 mL/min   Anion gap 11 5 - 15    Comment: Performed at [facility], [address] [place], [place] [address]  Glucose, capillary     Status: Abnormal    Collection Time: 04/13/20  7:41 AM  Result Value Ref Range   Glucose-Capillary 152 (H) 70 - 99 mg/dL    Comment: Glucose reference range applies only to samples taken after fasting for at least 8 hours.  Glucose, capillary     Status: Abnormal   Collection Time: 04/13/20 11:37 AM  Result Value Ref Range   Glucose-Capillary 249 (H) 70 - 99 mg/dL    Comment: Glucose reference range applies only to samples taken after fasting for at least 8 hours.  Glucose, capillary     Status: Abnormal   Collection Time: 04/13/20  4:24 PM  Result Value Ref Range   Glucose-Capillary 218 (H) 70 - 99 mg/dL    Comment: Glucose reference range applies only to samples taken after fasting for at least 8 hours.  Glucose, capillary     Status: Abnormal   Collection Time: 04/13/20  9:19 PM  Result Value Ref Range   Glucose-Capillary 292 (H) 70 - 99 mg/dL    Comment: Glucose reference range applies only to samples taken after fasting for at least 8 hours.  Glucose, capillary     Status: Abnormal   Collection Time: 04/14/20  7:29 AM  Result Value Ref Range   Glucose-Capillary 245 (H) 70 - 99 mg/dL    Comment: Glucose reference range applies only to samples taken after fasting for at least 8 hours.    DG Chest 1 View  Result Date: 04/12/2020 CLINICAL DATA:  Multiple falls, weakness EXAM: CHEST  1 VIEW COMPARISON:  03/03/2020 FINDINGS: Single frontal view of the chest demonstrates an unremarkable cardiac silhouette. No airspace disease, effusion, or pneumothorax. No acute bony abnormalities. IMPRESSION: 1. Stable exam, no acute process. Electronically Signed   By: Randa Ngo M.D.   On: 04/12/2020 17:41   CT Head Wo Contrast  Result Date: 04/12/2020 CLINICAL DATA:  Multiple falls. EXAM: CT HEAD WITHOUT CONTRAST CT CERVICAL SPINE WITHOUT CONTRAST TECHNIQUE: Multidetector CT imaging of the head and cervical spine was performed following the standard protocol without intravenous contrast. Multiplanar CT image  reconstructions of the cervical spine were also generated. COMPARISON:  Head CT 03/03/2020, CT cervical spine 03/03/2020. FINDINGS: CT HEAD FINDINGS Brain: Stable, mild generalized parenchymal atrophy. There is no acute intracranial hemorrhage. No demarcated cortical infarct. No extra-axial fluid collection. No evidence of intracranial mass. No midline shift. Vascular: No hyperdense vessel. Skull: Normal. Negative for fracture or focal lesion. Sinuses/Orbits: Visualized orbits show no acute finding. Minimal ethmoid sinus mucosal thickening. No significant mastoid  effusion. CT CERVICAL SPINE FINDINGS Alignment: Redemonstrated mild C4-C5, C5-C6 and C7-T1 grade 1 anterolisthesis. Skull base and vertebrae: The basion-dental and atlanto-dental intervals are maintained.No evidence of acute fracture to the cervical spine. Soft tissues and spinal canal: No prevertebral fluid or swelling. No visible canal hematoma. Disc levels: Redemonstrated advanced cervical spondylosis with multilevel disc space narrowing, posterior disc osteophytes, uncovertebral and facet hypertrophy. As before, there is multilevel degenerative osseous fusion across the disc spaces and facet joints. Upper chest: No consolidation within the imaged lung apices. No visible pneumothorax IMPRESSION: CT head: 1. No evidence of acute intracranial abnormality. 2. Stable, mild generalized parenchymal atrophy. 3. Minimal ethmoid sinus mucosal thickening. CT cervical spine: 1. No evidence of acute fracture to the cervical spine. 2. Redemonstrated advanced cervical spondylosis with multilevel degenerative fusion. 3. Unchanged mild grade 1 anterolisthesis at C4-C5, C5-C6 and C7-T1. Electronically Signed   By: Kellie Simmering DO   On: 04/12/2020 17:21   CT Cervical Spine Wo Contrast  Result Date: 04/12/2020 CLINICAL DATA:  Multiple falls. EXAM: CT HEAD WITHOUT CONTRAST CT CERVICAL SPINE WITHOUT CONTRAST TECHNIQUE: Multidetector CT imaging of the head and cervical  spine was performed following the standard protocol without intravenous contrast. Multiplanar CT image reconstructions of the cervical spine were also generated. COMPARISON:  Head CT 03/03/2020, CT cervical spine 03/03/2020. FINDINGS: CT HEAD FINDINGS Brain: Stable, mild generalized parenchymal atrophy. There is no acute intracranial hemorrhage. No demarcated cortical infarct. No extra-axial fluid collection. No evidence of intracranial mass. No midline shift. Vascular: No hyperdense vessel. Skull: Normal. Negative for fracture or focal lesion. Sinuses/Orbits: Visualized orbits show no acute finding. Minimal ethmoid sinus mucosal thickening. No significant mastoid effusion. CT CERVICAL SPINE FINDINGS Alignment: Redemonstrated mild C4-C5, C5-C6 and C7-T1 grade 1 anterolisthesis. Skull base and vertebrae: The basion-dental and atlanto-dental intervals are maintained.No evidence of acute fracture to the cervical spine. Soft tissues and spinal canal: No prevertebral fluid or swelling. No visible canal hematoma. Disc levels: Redemonstrated advanced cervical spondylosis with multilevel disc space narrowing, posterior disc osteophytes, uncovertebral and facet hypertrophy. As before, there is multilevel degenerative osseous fusion across the disc spaces and facet joints. Upper chest: No consolidation within the imaged lung apices. No visible pneumothorax IMPRESSION: CT head: 1. No evidence of acute intracranial abnormality. 2. Stable, mild generalized parenchymal atrophy. 3. Minimal ethmoid sinus mucosal thickening. CT cervical spine: 1. No evidence of acute fracture to the cervical spine. 2. Redemonstrated advanced cervical spondylosis with multilevel degenerative fusion. 3. Unchanged mild grade 1 anterolisthesis at C4-C5, C5-C6 and C7-T1. Electronically Signed   By: Kellie Simmering DO   On: 04/12/2020 17:21   MR BRAIN WO CONTRAST  Result Date: 04/12/2020 CLINICAL DATA:  Initial evaluation for acute altered mental status,  frequent falls. EXAM: MRI HEAD WITHOUT CONTRAST TECHNIQUE: Multiplanar, multiecho pulse sequences of the brain and surrounding structures were obtained without intravenous contrast. COMPARISON:  Prior head CT from earlier same day. FINDINGS: Brain: Examination moderately degraded by motion artifact. Generalized age-related cerebral atrophy. No significant cerebral white matter disease for patient age. No abnormal foci of restricted diffusion to suggest acute or subacute ischemia. Gray-white matter differentiation maintained. No encephalomalacia to suggest chronic cortical infarction. No foci of susceptibility artifact to suggest acute or chronic intracranial hemorrhage. No mass lesion, midline shift or mass effect. Mild ventricular prominence related to global parenchymal volume loss without hydrocephalus. No extra-axial fluid collection. Pituitary gland suprasellar region within normal limits. Midline structures intact. Vascular: Major intracranial vascular flow voids are maintained. Skull  and upper cervical spine: Craniocervical junction within normal limits. Bone marrow signal intensity grossly within normal limits. No scalp soft tissue abnormality. Sinuses/Orbits: Patient status post ocular lens replacement on the right. Globes and orbital soft tissues demonstrate no acute finding. Paranasal sinuses are largely clear. Trace right mastoid effusion noted, of doubtful significance. Other: None. IMPRESSION: 1. No acute intracranial abnormality. 2. Stable mild age-related cerebral atrophy. Electronically Signed   By: Jeannine Boga M.D.   On: 04/12/2020 19:45   DG Knee Complete 4 Views Left  Result Date: 04/12/2020 CLINICAL DATA:  Multiple falls, weakness EXAM: LEFT KNEE - COMPLETE 4+ VIEW COMPARISON:  None. FINDINGS: Frontal, bilateral oblique, lateral views of the left knee are obtained. There is a lucent area with sclerotic margins within the medial aspect of the proximal tibial diaphysis, likely benign  fibrous cortical defect. No fracture, subluxation, or dislocation. There is joint space narrowing most pronounced within the medial and lateral compartments with associated chondrocalcinosis consistent with osteoarthritis. No joint effusion. IMPRESSION: 1. Osteoarthritis.  No acute bony abnormality. Electronically Signed   By: Randa Ngo M.D.   On: 04/12/2020 17:43   DG Knee Complete 4 Views Right  Result Date: 04/12/2020 CLINICAL DATA:  Multiple falls, pain EXAM: RIGHT KNEE - COMPLETE 4+ VIEW COMPARISON:  None. FINDINGS: Frontal, bilateral oblique, lateral views of the right knee are obtained. There is moderate medial and lateral compartmental joint space narrowing with chondrocalcinosis consistent with osteoarthritis. No fracture, subluxation, or dislocation. There is a large suprapatellar joint effusion. IMPRESSION: 1. Large joint effusion. 2. Moderate medial and lateral compartmental osteoarthritis. 3. No acute displaced fracture. Electronically Signed   By: Randa Ngo M.D.   On: 04/12/2020 17:44   DG Hip Unilat W or Wo Pelvis 2-3 Views Left  Result Date: 04/12/2020 CLINICAL DATA:  Multiple falls, weakness, pain EXAM: DG HIP (WITH OR WITHOUT PELVIS) 2-3V LEFT COMPARISON:  04/05/2020 FINDINGS: Frontal view of the pelvis as well as frontal and frogleg lateral views of the left hip are obtained. Intramedullary rod with proximal dynamic screw and distal interlocking screw traversing a prior intertrochanteric left hip fracture unchanged in position. No new bony abnormality. Stable spondylosis lower lumbar spine. Sacroiliac joints are normal. IMPRESSION: 1. Stable postoperative changes from recent left hip fracture and ORIF. 2. No new fracture. Electronically Signed   By: Randa Ngo M.D.   On: 04/12/2020 17:47    Review of Systems  Constitutional: Negative for fever.  Respiratory: Positive for shortness of breath.   Neurological: Negative for numbness.   Blood pressure 129/61, pulse 60,  temperature (!) 97.4 F (36.3 C), temperature source Oral, resp. rate 18, height 5\' 11"  (1.803 m), weight 108.3 kg, SpO2 97 %. Physical Exam  Constitutional: He appears well-developed and well-nourished. No distress.  HENT:  Head: Normocephalic and atraumatic.  Mouth/Throat: No oropharyngeal exudate.  Eyes: Pupils are equal, round, and reactive to light. Right eye exhibits no discharge. Left eye exhibits no discharge. No scleral icterus.  Neck: No JVD present.  Respiratory: No stridor.  Musculoskeletal:     Cervical back: Neck supple.     Comments: Patient moves his left knee normally.  There is no instability muscle tone is normal without atrophy neurovascular exam is intact in the left knee with no effusion or ecchymosis  Right knee shows a small effusion and boggy synovium there is no ecchymosis or bruising no restrictions of motion muscle tone and strength are normal.  Knee is stable.  Skin is normal.  Neurovascular exam is intact  Lymphadenopathy:    He has no cervical adenopathy.  Neurological: He is alert. He is not disoriented. He displays normal reflexes. No sensory deficit. He exhibits normal muscle tone.  Skin: Skin is warm and dry. No rash noted. He is not diaphoretic. No erythema. No pallor.  Psychiatric: He has a normal mood and affect. His behavior is normal.    Assessment/Plan: Independent images assessment left knee and right knee show degenerative changes without fracture or dislocation there is a small to moderate effusion in the right knee  It seems that the effusion has pretty much resolved.  There is no restriction of motion.  No need to tap the knee at this point.  Patient can resume his normal rehab program and follow-up in orthopedics as previously scheduled.  [id number] -24 Right knee effusion   Arther Abbott 04/14/2020, 7:35 AM

## 2020-04-14 NOTE — Progress Notes (Signed)
Report called to [facility] at [facility] and all questions answered. Patient's wife, Georgiy Scalisi, made aware of patient's discharge to facility.  Patient transported to [place] by [facility] in stable condition, VSS.

## 2020-04-14 NOTE — TOC Transition Note (Addendum)
Transition of Care [facility]) - CM/SW Discharge Note   Patient Details  Name: Christian [name] MRN: [id number] Date of Birth: 1940-12-31  Transition of Care [facility]) CM/SW Contact:  Boneta Lucks, RN Phone Number: 04/14/2020, 12:28 PM   Clinical Narrative:   Patient is medically ready for discharge to [facility]. Elyse Hsu provided number for report. RN given the number, TOC scheduled EMS and printed medical necessity. TOC updated Christian [name] - wife with discharge plan. Marliss Coots with [facility] started the [facility] process with Christian [name] and will complete on Monday.    Final next level of care: Skilled Nursing Facility Barriers to Discharge: Barriers Resolved   Patient Goals and CMS Choice Patient states their goals for this hospitalization and ongoing recovery are:: to go back to SNF CMS Medicare.gov Compare Post Acute Care list provided to:: Patient Represenative (must comment) Choice offered to / list presented to : Spouse  Discharge Placement PASRR number recieved: (Pending review - TOC following, Elyse Hsu will complete Monday if needed)            Patient chooses bed at: [facility] Patient to be transferred to facility by: EMS Name of family member notified: Christian [name] - wife Patient and family notified of of transfer: 04/14/20  Discharge Plan and [facility]

## 2020-04-14 NOTE — Progress Notes (Signed)
2130 Pt was asking where his wife [name] went, advised that she left earlier. He then stated that she usually calls him. Called wife for pt. Wife called back concerned about his increasing confusion and stated that this did not start until he was placed on Zoloft at the "nursing home". She then called back after midnight stating that she remembered he had taken this med in the past and it caused these same behaviors and he should not be taking it. Notified Dr. Humphrey Rolls to make aware, ok to d/c Zoloft, marked as an allergy d/t intolerance. Will continue to monitor.

## 2020-04-14 NOTE — Discharge Summary (Addendum)
Physician Discharge Summary  Christian [name] [id number] DOB: 1940/12/24 DOA: 04/12/2020  PCP: Carlena Hurl, PA-C  Admit date: 04/12/2020 Discharge date: 04/14/2020  Admitted From: Home Disposition: SNF  Recommendations for Outpatient Follow-up:  1. Follow up with PCP in 1-2 weeks 2. Please obtain BMP/CBC in one week    Discharge Condition: Stable CODE STATUS: DNR Diet recommendation: Heart Healthy / Carb Modified   Brief/Interim Summary: 79 year old male with a history of chronic back pain, diabetes mellitus type 2, DVT/PE from prothrombin gene mutation, hypertension, hyperlipidemia, dementia, OSA presenting with generalized weakness and frequent falls.  The patient was recently discharged from the hospital after a stay from 03/03/2020 to 03/07/2020 after he suffered a left intertrochanteric hip fracture.  He underwent operative repair by Dr. Arther Abbott on 03/05/2020 with placement of an IM nail.  And was discharged to the [facility].  He did relatively well and was able to use a walker with some mild confusion at the time he left.  At baseline, the patient is pleasantly confused, but was improving at the time of his discharge.  According to the patient's spouse, the patient has had more falls and generalized weakness.  There is been no reports of fevers, chills, chest pain, short of breath, nausea, vomiting, diarrhea, abdominal pain.  Discharge Diagnoses:  Generalized weakness/frequent falls -Likely from deconditioning -MRI brain negative for acute findings -UA negative for pyuria -TSH--0.939 -serum [id number] -Folic [id number] -PT evaluation-->SNF  Acute metabolic encephalopathy -Patient appears to be back to baseline at time of d/c--he is A&O x 4 -He was intermittently pleasantly confused -UA negative for pyuria -MRI brain negative  Uncontrolled diabetes mellitus type 2 with hyperglycemia -03/04/2020 hemoglobin A1c 10.9 -NovoLog sliding  scale -Continue reduced dose Lantus -Holding metformin  Dementia without behavioral disturbance -Continue Aricept and Namenda  Chronic atrial fibrillation -Continue flecainide and diltiazem -Continue rivaroxaban  Prothrombin gene mutation/DVT/PE -Continue rivaroxaban  Right knee effusion -Likely secondary to the patient's frequent falling -X-ray without any osseous abnormalities -discussed with ortho--Dr. Zacarias Pontes will see patient, may need arthrocentesis to r/o hemarthrosis -Dr. Aline Brochure saw patient and felt effusion was improving and did not require arthrocentesis  Depression/anxiety -Continued sertraline initially-->wife wanted it stopped b/c she felt it was contributing to his confusion  Hyperlipidemia -Continue Crestor   Discharge Instructions      Reactions   Bee Venom Swelling   Ultram [tramadol Hcl]    Makes him wired, gives insomnia   Oxycodone Other (See Comments)   Mental status changes   Oxycontin [oxycodone Hcl] Other (See Comments)   Mental status changes per pt   Zoloft [sertraline Hcl] Other (See Comments)   Increases confusion per wife       Medication List    STOP taking these medications   docusate sodium 100 MG capsule Commonly known as: COLACE   insulin lispro 100 UNIT/ML KwikPen Commonly known as: HumaLOG KwikPen   naproxen sodium 220 MG tablet Commonly known as: ALEVE     TAKE these medications   acetaminophen 325 MG tablet Commonly known as: TYLENOL Take 650 mg by mouth every 6 (six) hours as needed for mild pain or moderate pain.   diltiazem 30 MG tablet Commonly known as: CARDIZEM TAKE 1 TABLET(30 MG) BY MOUTH TWICE DAILY What changed:   how much to take  how to take this  when to take this  additional instructions   donepezil 10 MG tablet Commonly known as: ARICEPT Take 1 tablet (10 mg) by mouth at  bedtime.   flecainide 150 MG tablet Commonly known as: TAMBOCOR TAKE 1/2 TABLET(75 MG) BY MOUTH TWICE  DAILY What changed:   how much to take  how to take this  when to take this  additional instructions   folic acid 1 MG tablet Commonly known as: FOLVITE Take 1 mg daily What changed:   how much to take  how to take this  when to take this  additional instructions   furosemide 40 MG tablet Commonly known as: LASIX Take 40 mg daily AS NEEDED  For swelling. What changed:   how much to take  how to take this  when to take this  reasons to take this  additional instructions   memantine 28 MG Cp24 24 hr capsule Commonly known as: NAMENDA XR TAKE 1 CAPSULE(28 MG) BY MOUTH DAILY What changed:   how much to take  how to take this  when to take this  additional instructions   metFORMIN 850 MG tablet Commonly known as: GLUCOPHAGE TAKE 1 TABLET(850 MG) BY MOUTH TWICE DAILY WITH A MEAL What changed:   how much to take  how to take this  when to take this  additional instructions   OSTEO BI-FLEX ADV JOINT SHIELD PO Take 2 tablets by mouth daily.   polyethylene glycol 17 g packet Commonly known as: MIRALAX / GLYCOLAX Take 17 g by mouth 2 (two) times daily.   rivaroxaban 20 MG Tabs tablet Commonly known as: Xarelto TAKE 1 TABLET BY MOUTH EVERY DAY WITH SUPPER What changed:   how much to take  how to take this  when to take this  additional instructions   rosuvastatin 10 MG tablet Commonly known as: CRESTOR TAKE 1 TABLET(10 MG) BY MOUTH AT BEDTIME What changed:   how much to take  how to take this  when to take this  additional instructions   sertraline 50 MG tablet Commonly known as: ZOLOFT Take 1 tablet (50 mg total) by mouth daily. What changed: when to take this   tamsulosin 0.4 MG Caps capsule Commonly known as: FLOMAX TAKE 1 CAPSULE(0.4 MG) BY MOUTH DAILY What changed:   how much to take  how to take this  when to take this  additional instructions   tolterodine 4 MG 24 hr capsule Commonly known as: DETROL  LA Take 1 capsule (4 mg total) by mouth at bedtime.   Tyler Aas FlexTouch 100 UNIT/ML FlexTouch Pen Generic drug: insulin degludec Inject 0.46 mLs (46 Units total) into the skin daily. What changed:   how much to take  when to take this   vitamin B-12 500 MCG tablet Commonly known as: CYANOCOBALAMIN Take 1 tablet (500 mcg total) by mouth daily. Start taking on: Apr 15, 2020       Allergies  Allergen Reactions  . Bee Venom Swelling  . Ultram [Tramadol Hcl]     Makes him wired, gives insomnia  . Oxycodone Other (See Comments)    Mental status changes  . Oxycontin [Oxycodone Hcl] Other (See Comments)    Mental status changes per pt  . Zoloft [Sertraline Hcl] Other (See Comments)    Increases confusion per wife     Consultations:  Ortho--[facility]   Procedures/Studies: DG Chest 1 View  Result Date: 04/12/2020 CLINICAL DATA:  Multiple falls, weakness EXAM: CHEST  1 VIEW COMPARISON:  03/03/2020 FINDINGS: Single frontal view of the chest demonstrates an unremarkable cardiac silhouette. No airspace disease, effusion, or pneumothorax. No acute bony abnormalities. IMPRESSION: 1. Stable  exam, no acute process. Electronically Signed   By: Randa Ngo M.D.   On: 04/12/2020 17:41   CT Head Wo Contrast  Result Date: 04/12/2020 CLINICAL DATA:  Multiple falls. EXAM: CT HEAD WITHOUT CONTRAST CT CERVICAL SPINE WITHOUT CONTRAST TECHNIQUE: Multidetector CT imaging of the head and cervical spine was performed following the standard protocol without intravenous contrast. Multiplanar CT image reconstructions of the cervical spine were also generated. COMPARISON:  Head CT 03/03/2020, CT cervical spine 03/03/2020. FINDINGS: CT HEAD FINDINGS Brain: Stable, mild generalized parenchymal atrophy. There is no acute intracranial hemorrhage. No demarcated cortical infarct. No extra-axial fluid collection. No evidence of intracranial mass. No midline shift. Vascular: No hyperdense vessel. Skull: Normal.  Negative for fracture or focal lesion. Sinuses/Orbits: Visualized orbits show no acute finding. Minimal ethmoid sinus mucosal thickening. No significant mastoid effusion. CT CERVICAL SPINE FINDINGS Alignment: Redemonstrated mild C4-C5, C5-C6 and C7-T1 grade 1 anterolisthesis. Skull base and vertebrae: The basion-dental and atlanto-dental intervals are maintained.No evidence of acute fracture to the cervical spine. Soft tissues and spinal canal: No prevertebral fluid or swelling. No visible canal hematoma. Disc levels: Redemonstrated advanced cervical spondylosis with multilevel disc space narrowing, posterior disc osteophytes, uncovertebral and facet hypertrophy. As before, there is multilevel degenerative osseous fusion across the disc spaces and facet joints. Upper chest: No consolidation within the imaged lung apices. No visible pneumothorax IMPRESSION: CT head: 1. No evidence of acute intracranial abnormality. 2. Stable, mild generalized parenchymal atrophy. 3. Minimal ethmoid sinus mucosal thickening. CT cervical spine: 1. No evidence of acute fracture to the cervical spine. 2. Redemonstrated advanced cervical spondylosis with multilevel degenerative fusion. 3. Unchanged mild grade 1 anterolisthesis at C4-C5, C5-C6 and C7-T1. Electronically Signed   By: Kellie Simmering DO   On: 04/12/2020 17:21   CT Cervical Spine Wo Contrast  Result Date: 04/12/2020 CLINICAL DATA:  Multiple falls. EXAM: CT HEAD WITHOUT CONTRAST CT CERVICAL SPINE WITHOUT CONTRAST TECHNIQUE: Multidetector CT imaging of the head and cervical spine was performed following the standard protocol without intravenous contrast. Multiplanar CT image reconstructions of the cervical spine were also generated. COMPARISON:  Head CT 03/03/2020, CT cervical spine 03/03/2020. FINDINGS: CT HEAD FINDINGS Brain: Stable, mild generalized parenchymal atrophy. There is no acute intracranial hemorrhage. No demarcated cortical infarct. No extra-axial fluid  collection. No evidence of intracranial mass. No midline shift. Vascular: No hyperdense vessel. Skull: Normal. Negative for fracture or focal lesion. Sinuses/Orbits: Visualized orbits show no acute finding. Minimal ethmoid sinus mucosal thickening. No significant mastoid effusion. CT CERVICAL SPINE FINDINGS Alignment: Redemonstrated mild C4-C5, C5-C6 and C7-T1 grade 1 anterolisthesis. Skull base and vertebrae: The basion-dental and atlanto-dental intervals are maintained.No evidence of acute fracture to the cervical spine. Soft tissues and spinal canal: No prevertebral fluid or swelling. No visible canal hematoma. Disc levels: Redemonstrated advanced cervical spondylosis with multilevel disc space narrowing, posterior disc osteophytes, uncovertebral and facet hypertrophy. As before, there is multilevel degenerative osseous fusion across the disc spaces and facet joints. Upper chest: No consolidation within the imaged lung apices. No visible pneumothorax IMPRESSION: CT head: 1. No evidence of acute intracranial abnormality. 2. Stable, mild generalized parenchymal atrophy. 3. Minimal ethmoid sinus mucosal thickening. CT cervical spine: 1. No evidence of acute fracture to the cervical spine. 2. Redemonstrated advanced cervical spondylosis with multilevel degenerative fusion. 3. Unchanged mild grade 1 anterolisthesis at C4-C5, C5-C6 and C7-T1. Electronically Signed   By: Kellie Simmering DO   On: 04/12/2020 17:21   MR BRAIN WO CONTRAST  Result Date:  04/12/2020 CLINICAL DATA:  Initial evaluation for acute altered mental status, frequent falls. EXAM: MRI HEAD WITHOUT CONTRAST TECHNIQUE: Multiplanar, multiecho pulse sequences of the brain and surrounding structures were obtained without intravenous contrast. COMPARISON:  Prior head CT from earlier same day. FINDINGS: Brain: Examination moderately degraded by motion artifact. Generalized age-related cerebral atrophy. No significant cerebral white matter disease for patient  age. No abnormal foci of restricted diffusion to suggest acute or subacute ischemia. Gray-white matter differentiation maintained. No encephalomalacia to suggest chronic cortical infarction. No foci of susceptibility artifact to suggest acute or chronic intracranial hemorrhage. No mass lesion, midline shift or mass effect. Mild ventricular prominence related to global parenchymal volume loss without hydrocephalus. No extra-axial fluid collection. Pituitary gland suprasellar region within normal limits. Midline structures intact. Vascular: Major intracranial vascular flow voids are maintained. Skull and upper cervical spine: Craniocervical junction within normal limits. Bone marrow signal intensity grossly within normal limits. No scalp soft tissue abnormality. Sinuses/Orbits: Patient status post ocular lens replacement on the right. Globes and orbital soft tissues demonstrate no acute finding. Paranasal sinuses are largely clear. Trace right mastoid effusion noted, of doubtful significance. Other: None. IMPRESSION: 1. No acute intracranial abnormality. 2. Stable mild age-related cerebral atrophy. Electronically Signed   By: Jeannine Boga M.D.   On: 04/12/2020 19:45   DG Knee Complete 4 Views Left  Result Date: 04/12/2020 CLINICAL DATA:  Multiple falls, weakness EXAM: LEFT KNEE - COMPLETE 4+ VIEW COMPARISON:  None. FINDINGS: Frontal, bilateral oblique, lateral views of the left knee are obtained. There is a lucent area with sclerotic margins within the medial aspect of the proximal tibial diaphysis, likely benign fibrous cortical defect. No fracture, subluxation, or dislocation. There is joint space narrowing most pronounced within the medial and lateral compartments with associated chondrocalcinosis consistent with osteoarthritis. No joint effusion. IMPRESSION: 1. Osteoarthritis.  No acute bony abnormality. Electronically Signed   By: Randa Ngo M.D.   On: 04/12/2020 17:43   DG Knee Complete 4 Views  Right  Result Date: 04/12/2020 CLINICAL DATA:  Multiple falls, pain EXAM: RIGHT KNEE - COMPLETE 4+ VIEW COMPARISON:  None. FINDINGS: Frontal, bilateral oblique, lateral views of the right knee are obtained. There is moderate medial and lateral compartmental joint space narrowing with chondrocalcinosis consistent with osteoarthritis. No fracture, subluxation, or dislocation. There is a large suprapatellar joint effusion. IMPRESSION: 1. Large joint effusion. 2. Moderate medial and lateral compartmental osteoarthritis. 3. No acute displaced fracture. Electronically Signed   By: Randa Ngo M.D.   On: 04/12/2020 17:44   DG Hip Unilat W or Wo Pelvis 2-3 Views Left  Result Date: 04/12/2020 CLINICAL DATA:  Multiple falls, weakness, pain EXAM: DG HIP (WITH OR WITHOUT PELVIS) 2-3V LEFT COMPARISON:  04/05/2020 FINDINGS: Frontal view of the pelvis as well as frontal and frogleg lateral views of the left hip are obtained. Intramedullary rod with proximal dynamic screw and distal interlocking screw traversing a prior intertrochanteric left hip fracture unchanged in position. No new bony abnormality. Stable spondylosis lower lumbar spine. Sacroiliac joints are normal. IMPRESSION: 1. Stable postoperative changes from recent left hip fracture and ORIF. 2. No new fracture. Electronically Signed   By: Randa Ngo M.D.   On: 04/12/2020 17:47   DG HIP UNILAT WITH PELVIS 2-3 VIEWS LEFT  Result Date: 04/05/2020 [facility] [place] imaging pelvis left hip, nail noted Gamma nail left hip tip to apex distance looks good large greater trochanteric tip fracture Otherwise fracture fixation intact and fracture reduced.  Discharge Exam: Vitals:   04/13/20 1946 04/14/20 0539  BP: 140/74 129/61  Pulse: 80 60  Resp: 16 18  Temp: 98.9 F (37.2 C) (!) 97.4 F (36.3 C)  SpO2: 95% 97%   Vitals:   04/13/20 0650 04/13/20 1519 04/13/20 1946 04/14/20 0539  BP: 126/65 133/64 140/74 129/61  Pulse: 61 73 80 60   Resp: 20 17 16 18   Temp: 97.9 F (36.6 C) 98.4 F (36.9 C) 98.9 F (37.2 C) (!) 97.4 F (36.3 C)  TempSrc: Oral Oral Oral Oral  SpO2: 99% 95% 95% 97%  Weight:      Height:        General: Pt is alert, awake, not in acute distress Cardiovascular: RRR, S1/S2 +, no rubs, no gallops Respiratory: bibasilar crackles, no wheeze Abdominal: Soft, NT, ND, bowel sounds + Extremities: 1+ LE edema, no cyanosis   The results of significant diagnostics from this hospitalization (including imaging, microbiology, ancillary and laboratory) are listed below for reference.    Significant Diagnostic Studies: DG Chest 1 View  Result Date: 04/12/2020 CLINICAL DATA:  Multiple falls, weakness EXAM: CHEST  1 VIEW COMPARISON:  03/03/2020 FINDINGS: Single frontal view of the chest demonstrates an unremarkable cardiac silhouette. No airspace disease, effusion, or pneumothorax. No acute bony abnormalities. IMPRESSION: 1. Stable exam, no acute process. Electronically Signed   By: Randa Ngo M.D.   On: 04/12/2020 17:41   CT Head Wo Contrast  Result Date: 04/12/2020 CLINICAL DATA:  Multiple falls. EXAM: CT HEAD WITHOUT CONTRAST CT CERVICAL SPINE WITHOUT CONTRAST TECHNIQUE: Multidetector CT imaging of the head and cervical spine was performed following the standard protocol without intravenous contrast. Multiplanar CT image reconstructions of the cervical spine were also generated. COMPARISON:  Head CT 03/03/2020, CT cervical spine 03/03/2020. FINDINGS: CT HEAD FINDINGS Brain: Stable, mild generalized parenchymal atrophy. There is no acute intracranial hemorrhage. No demarcated cortical infarct. No extra-axial fluid collection. No evidence of intracranial mass. No midline shift. Vascular: No hyperdense vessel. Skull: Normal. Negative for fracture or focal lesion. Sinuses/Orbits: Visualized orbits show no acute finding. Minimal ethmoid sinus mucosal thickening. No significant mastoid effusion. CT CERVICAL SPINE  FINDINGS Alignment: Redemonstrated mild C4-C5, C5-C6 and C7-T1 grade 1 anterolisthesis. Skull base and vertebrae: The basion-dental and atlanto-dental intervals are maintained.No evidence of acute fracture to the cervical spine. Soft tissues and spinal canal: No prevertebral fluid or swelling. No visible canal hematoma. Disc levels: Redemonstrated advanced cervical spondylosis with multilevel disc space narrowing, posterior disc osteophytes, uncovertebral and facet hypertrophy. As before, there is multilevel degenerative osseous fusion across the disc spaces and facet joints. Upper chest: No consolidation within the imaged lung apices. No visible pneumothorax IMPRESSION: CT head: 1. No evidence of acute intracranial abnormality. 2. Stable, mild generalized parenchymal atrophy. 3. Minimal ethmoid sinus mucosal thickening. CT cervical spine: 1. No evidence of acute fracture to the cervical spine. 2. Redemonstrated advanced cervical spondylosis with multilevel degenerative fusion. 3. Unchanged mild grade 1 anterolisthesis at C4-C5, C5-C6 and C7-T1. Electronically Signed   By: Kellie Simmering DO   On: 04/12/2020 17:21   CT Cervical Spine Wo Contrast  Result Date: 04/12/2020 CLINICAL DATA:  Multiple falls. EXAM: CT HEAD WITHOUT CONTRAST CT CERVICAL SPINE WITHOUT CONTRAST TECHNIQUE: Multidetector CT imaging of the head and cervical spine was performed following the standard protocol without intravenous contrast. Multiplanar CT image reconstructions of the cervical spine were also generated. COMPARISON:  Head CT 03/03/2020, CT cervical spine 03/03/2020. FINDINGS: CT HEAD FINDINGS Brain: Stable, mild generalized  parenchymal atrophy. There is no acute intracranial hemorrhage. No demarcated cortical infarct. No extra-axial fluid collection. No evidence of intracranial mass. No midline shift. Vascular: No hyperdense vessel. Skull: Normal. Negative for fracture or focal lesion. Sinuses/Orbits: Visualized orbits show no acute  finding. Minimal ethmoid sinus mucosal thickening. No significant mastoid effusion. CT CERVICAL SPINE FINDINGS Alignment: Redemonstrated mild C4-C5, C5-C6 and C7-T1 grade 1 anterolisthesis. Skull base and vertebrae: The basion-dental and atlanto-dental intervals are maintained.No evidence of acute fracture to the cervical spine. Soft tissues and spinal canal: No prevertebral fluid or swelling. No visible canal hematoma. Disc levels: Redemonstrated advanced cervical spondylosis with multilevel disc space narrowing, posterior disc osteophytes, uncovertebral and facet hypertrophy. As before, there is multilevel degenerative osseous fusion across the disc spaces and facet joints. Upper chest: No consolidation within the imaged lung apices. No visible pneumothorax IMPRESSION: CT head: 1. No evidence of acute intracranial abnormality. 2. Stable, mild generalized parenchymal atrophy. 3. Minimal ethmoid sinus mucosal thickening. CT cervical spine: 1. No evidence of acute fracture to the cervical spine. 2. Redemonstrated advanced cervical spondylosis with multilevel degenerative fusion. 3. Unchanged mild grade 1 anterolisthesis at C4-C5, C5-C6 and C7-T1. Electronically Signed   By: Kellie Simmering DO   On: 04/12/2020 17:21   MR BRAIN WO CONTRAST  Result Date: 04/12/2020 CLINICAL DATA:  Initial evaluation for acute altered mental status, frequent falls. EXAM: MRI HEAD WITHOUT CONTRAST TECHNIQUE: Multiplanar, multiecho pulse sequences of the brain and surrounding structures were obtained without intravenous contrast. COMPARISON:  Prior head CT from earlier same day. FINDINGS: Brain: Examination moderately degraded by motion artifact. Generalized age-related cerebral atrophy. No significant cerebral white matter disease for patient age. No abnormal foci of restricted diffusion to suggest acute or subacute ischemia. Gray-white matter differentiation maintained. No encephalomalacia to suggest chronic cortical infarction. No foci  of susceptibility artifact to suggest acute or chronic intracranial hemorrhage. No mass lesion, midline shift or mass effect. Mild ventricular prominence related to global parenchymal volume loss without hydrocephalus. No extra-axial fluid collection. Pituitary gland suprasellar region within normal limits. Midline structures intact. Vascular: Major intracranial vascular flow voids are maintained. Skull and upper cervical spine: Craniocervical junction within normal limits. Bone marrow signal intensity grossly within normal limits. No scalp soft tissue abnormality. Sinuses/Orbits: Patient status post ocular lens replacement on the right. Globes and orbital soft tissues demonstrate no acute finding. Paranasal sinuses are largely clear. Trace right mastoid effusion noted, of doubtful significance. Other: None. IMPRESSION: 1. No acute intracranial abnormality. 2. Stable mild age-related cerebral atrophy. Electronically Signed   By: Jeannine Boga M.D.   On: 04/12/2020 19:45   DG Knee Complete 4 Views Left  Result Date: 04/12/2020 CLINICAL DATA:  Multiple falls, weakness EXAM: LEFT KNEE - COMPLETE 4+ VIEW COMPARISON:  None. FINDINGS: Frontal, bilateral oblique, lateral views of the left knee are obtained. There is a lucent area with sclerotic margins within the medial aspect of the proximal tibial diaphysis, likely benign fibrous cortical defect. No fracture, subluxation, or dislocation. There is joint space narrowing most pronounced within the medial and lateral compartments with associated chondrocalcinosis consistent with osteoarthritis. No joint effusion. IMPRESSION: 1. Osteoarthritis.  No acute bony abnormality. Electronically Signed   By: Randa Ngo M.D.   On: 04/12/2020 17:43   DG Knee Complete 4 Views Right  Result Date: 04/12/2020 CLINICAL DATA:  Multiple falls, pain EXAM: RIGHT KNEE - COMPLETE 4+ VIEW COMPARISON:  None. FINDINGS: Frontal, bilateral oblique, lateral views of the right knee are  obtained. There  is moderate medial and lateral compartmental joint space narrowing with chondrocalcinosis consistent with osteoarthritis. No fracture, subluxation, or dislocation. There is a large suprapatellar joint effusion. IMPRESSION: 1. Large joint effusion. 2. Moderate medial and lateral compartmental osteoarthritis. 3. No acute displaced fracture. Electronically Signed   By: Randa Ngo M.D.   On: 04/12/2020 17:44   DG Hip Unilat W or Wo Pelvis 2-3 Views Left  Result Date: 04/12/2020 CLINICAL DATA:  Multiple falls, weakness, pain EXAM: DG HIP (WITH OR WITHOUT PELVIS) 2-3V LEFT COMPARISON:  04/05/2020 FINDINGS: Frontal view of the pelvis as well as frontal and frogleg lateral views of the left hip are obtained. Intramedullary rod with proximal dynamic screw and distal interlocking screw traversing a prior intertrochanteric left hip fracture unchanged in position. No new bony abnormality. Stable spondylosis lower lumbar spine. Sacroiliac joints are normal. IMPRESSION: 1. Stable postoperative changes from recent left hip fracture and ORIF. 2. No new fracture. Electronically Signed   By: Randa Ngo M.D.   On: 04/12/2020 17:47   DG HIP UNILAT WITH PELVIS 2-3 VIEWS LEFT  Result Date: 04/05/2020 [facility] imaging pelvis left hip, nail noted Gamma nail left hip tip to apex distance looks good large greater trochanteric tip fracture Otherwise fracture fixation intact and fracture reduced.     Microbiology: Recent Results (from the past 240 hour(s))  SARS Coronavirus 2 by RT PCR (hospital order, performed in [facility] hospital lab) Nasopharyngeal Nasopharyngeal Swab     Status: None   Collection Time: 04/12/20  9:07 PM   Specimen: Nasopharyngeal Swab  Result Value Ref Range Status   SARS Coronavirus 2 NEGATIVE NEGATIVE Final    Comment: (NOTE) SARS-CoV-2 target nucleic acids are NOT DETECTED. The SARS-CoV-2 RNA is generally detectable in upper and lower respiratory specimens  during the acute phase of infection. The lowest concentration of SARS-CoV-2 viral copies this assay can detect is 250 copies / mL. A negative result does not preclude SARS-CoV-2 infection and should not be used as the sole basis for treatment or other patient management decisions.  A negative result may occur with improper specimen collection / handling, submission of specimen other than nasopharyngeal swab, presence of viral mutation(s) within the areas targeted by this assay, and inadequate number of viral copies (<250 copies / mL). A negative result must be combined with clinical observations, patient history, and epidemiological information. Fact Sheet for Patients:   StrictlyIdeas.no Fact Sheet for Healthcare Providers: BankingDealers.co.za This test is not yet approved or cleared  by the [place] FDA and has been authorized for detection and/or diagnosis of SARS-CoV-2 by FDA under an Emergency Use Authorization (EUA).  This EUA will remain in effect (meaning this test can be used) for the duration of the COVID-19 declaration under Section 564(b)(1) of the Act, 21 U.S.C. section 360bbb-3(b)(1), unless the authorization is terminated or revoked sooner. Performed at [facility], [address], [place], [place] [address]      Labs: Basic Metabolic Panel: Recent Labs  Lab 04/12/20 1608 04/13/20 0610  NA 138 142  K 4.5 4.1  CL 104 106  CO2 24 25  GLUCOSE 191* 154*  BUN 16 13  CREATININE 0.61 0.55*  CALCIUM 9.0 9.1   Liver Function Tests: Recent Labs  Lab 04/12/20 1608 04/13/20 0610  AST 20 14*  ALT 19 16  ALKPHOS 153* 128*  BILITOT 0.5 0.4  PROT 7.1 6.2*  ALBUMIN 3.2* 2.9*   No results for input(s): LIPASE, AMYLASE in the last 168  hours. No results for input(s): AMMONIA in the last 168 hours. CBC: Recent Labs  Lab 04/12/20 1608 04/13/20 0610  WBC 8.8 6.0  NEUTROABS 6.8  --   HGB 11.5* 12.0*  HCT 38.4*  39.2  MCV 98.2 96.3  PLT 231 241   Cardiac Enzymes: No results for input(s): CKTOTAL, CKMB, CKMBINDEX, TROPONINI in the last 168 hours. BNP: Invalid input(s): POCBNP CBG: Recent Labs  Lab 04/13/20 1137 04/13/20 1624 04/13/20 2119 04/14/20 0729 04/14/20 1103  GLUCAP 249* 218* 292* 245* 306*    Time coordinating discharge:  36 minutes  Signed:  Orson Eva, DO [facility] Pager: [phone] 04/14/2020, 11:07 AM

## 2020-04-17 DIAGNOSIS — R296 Repeated falls: Secondary | ICD-10-CM | POA: Diagnosis not present

## 2020-04-17 DIAGNOSIS — G9341 Metabolic encephalopathy: Secondary | ICD-10-CM | POA: Diagnosis not present

## 2020-04-17 DIAGNOSIS — E119 Type 2 diabetes mellitus without complications: Secondary | ICD-10-CM | POA: Diagnosis not present

## 2020-04-17 DIAGNOSIS — R531 Weakness: Secondary | ICD-10-CM | POA: Diagnosis not present

## 2020-04-17 NOTE — Telephone Encounter (Signed)
The pt. Showed up on the TOC report was recently in the hospital for a fall, weakness, and ambulatory dysfunction and is to go to [facility] [place].

## 2020-04-18 ENCOUNTER — Other Ambulatory Visit: Payer: Self-pay | Admitting: *Deleted

## 2020-04-18 DIAGNOSIS — I482 Chronic atrial fibrillation, unspecified: Secondary | ICD-10-CM | POA: Diagnosis not present

## 2020-04-18 NOTE — Patient Outreach (Signed)
Screened for potential [facility] Care Management needs as a benefit of  [employer].  Christian [name] is currently receiving skilled therapy at [facility]. He was recently discharged from [facility] Care Management RNCM and [facility] palliative referrals were made upon most recent SNF transition.  Writer attended telephonic interdisciplinary team meeting to assess disposition needs and transition plan for resident.   Facility reports care plan meeting with wife is scheduled for today. Member requires extensive assistance with therapy. Has dementia. Not clear if member's wife is able to care for him at home.   Made facility aware that [facility] palliative referral was made prior. Facility SW states she will ask family if they want to have palliative follow while in SNF.   Will continue to follow for transition plans and [facility] Care Management services. Will plan outreach to [facility] wife as appropriate.   Notification made to [facility] palliative to make aware that Christian [name] is at [facility].   [name], MSN-Ed, RN,BSN [place] Acute Care Coordinator [phone] [facility]) [phone]  (Toll free office)

## 2020-04-19 ENCOUNTER — Telehealth: Payer: Self-pay | Admitting: Medical

## 2020-04-19 NOTE — Telephone Encounter (Signed)
Home health certification - I completed my portion of the home health certification form.    Please stamp Dr. Redmond School sig on all the pages at the bottom/or have him cosign.   Please make copy for Melissa for billing and notify her of this, and send/fax back copy

## 2020-04-19 NOTE — Telephone Encounter (Signed)
DONE

## 2020-04-20 DIAGNOSIS — E119 Type 2 diabetes mellitus without complications: Secondary | ICD-10-CM | POA: Diagnosis not present

## 2020-04-20 DIAGNOSIS — R531 Weakness: Secondary | ICD-10-CM | POA: Diagnosis not present

## 2020-04-20 DIAGNOSIS — G9341 Metabolic encephalopathy: Secondary | ICD-10-CM | POA: Diagnosis not present

## 2020-04-20 DIAGNOSIS — R296 Repeated falls: Secondary | ICD-10-CM | POA: Diagnosis not present

## 2020-04-25 ENCOUNTER — Other Ambulatory Visit: Payer: Self-pay | Admitting: *Deleted

## 2020-04-25 DIAGNOSIS — F329 Major depressive disorder, single episode, unspecified: Secondary | ICD-10-CM | POA: Diagnosis not present

## 2020-04-25 DIAGNOSIS — F419 Anxiety disorder, unspecified: Secondary | ICD-10-CM | POA: Diagnosis not present

## 2020-04-25 NOTE — Patient Outreach (Signed)
Screened for potential [facility] Care Management needs as a benefit of  [employer].  [name] is receiving skilled therapy at [facility].  Writer attended telephonic interdisciplinary team meeting to assess for disposition needs and transition plan for resident.   Facility [name] member's wife is now amenable to long term care placement and is applying for Medicaid. Writer to follow up with [name] to find out status of Medicaid application process.  Telephone call made to Christian [name] (DPR/wife) [phone]. Patient identifiers confirmed. Christian [name] she has a headache. Therefore conversation was brief. Christian [name] she has not applied for Medicaid yet. States "I need to speak with a lawyer about my particular situation first. I will not apply until I speak with a lawyer."  She did not wish for writer to provide DSS contact information so she can at least begin the process.   Communication sent to facility to make aware of writer's conversation with wife. Also inquired whether facility business office can assist with the Medicaid application.  Will continue to follow and collaborate while member resides in SNF.     Christian Rolling, MSN-Ed, RN,BSN [place] Acute Care Coordinator [phone] [facility]) [phone]  (Toll free office)

## 2020-04-26 ENCOUNTER — Non-Acute Institutional Stay: Payer: Medicare Other

## 2020-04-26 ENCOUNTER — Other Ambulatory Visit: Payer: Self-pay

## 2020-04-26 DIAGNOSIS — I10 Essential (primary) hypertension: Secondary | ICD-10-CM | POA: Diagnosis not present

## 2020-04-26 DIAGNOSIS — R296 Repeated falls: Secondary | ICD-10-CM | POA: Diagnosis not present

## 2020-04-26 DIAGNOSIS — R531 Weakness: Secondary | ICD-10-CM | POA: Diagnosis not present

## 2020-04-26 DIAGNOSIS — Z515 Encounter for palliative care: Secondary | ICD-10-CM

## 2020-04-26 DIAGNOSIS — D649 Anemia, unspecified: Secondary | ICD-10-CM | POA: Diagnosis not present

## 2020-04-30 NOTE — Progress Notes (Signed)
COMMUNITY PALLIATIVE CARE SW NOTE  PATIENT NAME: Christian [name] DOB: 18-Mar-1941 MRN: [id number]  PRIMARY CARE PROVIDER: Carlena Hurl, PA-C  RESPONSIBLE PARTY:  Acct ID - Guarantor Home Phone Work Phone Relationship Acct Type  [id number] Reatha [phone]  Self P/F     [address], [place], [place] [address]     PLAN OF CARE and INTERVENTIONS:             1. GOALS OF CARE/ ADVANCE CARE PLANNING:  Patient is a DNR, form on the chart. Goal is for patient to receive therapy and return home.  2. SOCIAL/EMOTIONAL/SPIRITUAL ASSESSMENT/ INTERVENTIONS:  SW completed initial visit with patient at the facility [facility]). He was in bed sleeping, but easily aroused. He denied pain. SW introduced herself to patient, role of palliative care team and visit frequency. He was open to ongoing visit/support by the team. Patient stated that he felt therapy was going well. He seemed groggy as he tried to stay awake and apologized for feeling tired.SW consulted with the nurse-Mara. She reported that patient's sugar levels have been up but they are managing it with the medication. He has swelling in his legs that have been persistent. He was evaluated for PT/OT and will begin in-house therapy. His appetite is good and he naps during on and off throughout the day. SW also consulted with the facility Elmyra Ricks, who confirmed that patient has a DNR, MOST form and POA on file with the facility. SW provided supportive presence, active listening, observation, introduction and education regarding the palliative care program and reinforced access to services.  3. PATIENT/CAREGIVER EDUCATION/ COPING:  Patient appeared to be alert and oriented to self and situation. SW provided education regarding the palliative care program and he was receptive to ongoing visits/support. He appears to be coping well. Staff verbalized no concerns regarding his adjustment to the facility or coping issues.  4. PERSONAL EMERGENCY  PLAN:  Per facility protocol.  5. COMMUNITY RESOURCES COORDINATION/ HEALTH CARE NAVIGATION:  Patient was evaluated for PT/OT and will begin therapy.  6. FINANCIAL/LEGAL CONCERNS/INTERVENTIONS:  No financial or legal issues presented.      SOCIAL HX:  Social History   Tobacco Use  . Smoking status: Former Smoker    Packs/day: 1.00    Years: 10.00    Pack years: 10.00    Types: Cigarettes    Quit date: 11/25/2004    Years since quitting: 15.4  . Smokeless tobacco: Never Used  Substance Use Topics  . Alcohol use: Yes    Alcohol/week: 1.0 standard drinks    Types: 1 Glasses of wine per week    Comment: once a month per pt     CODE STATUS: DRN ADVANCED DIRECTIVES: Yes MOST FORM COMPLETE:  Yes HOSPICE EDUCATION PROVIDED: No  PPS: Patient appears to be afraid to stand due to past falls. Staff are using a hoyer to stand him up. He is alert and oriented to self and situation.   Duration of visit and documentation: [address], LCSW

## 2020-05-01 ENCOUNTER — Other Ambulatory Visit: Payer: Self-pay | Admitting: Adult Health

## 2020-05-02 ENCOUNTER — Other Ambulatory Visit: Payer: Self-pay | Admitting: *Deleted

## 2020-05-02 NOTE — Patient Outreach (Signed)
Screened for potential [facility] Care Management needs as a benefit of  [employer].  Christian [name] is currently receiving skilled therapy at [facility].  Writer attended telephonic interdisciplinary team meeting to assess for disposition needs and transition plan for resident.   Facility reports member is not progressing with therapy. States [name] has met with an Industrial/product designer.   [facility] palliative is following.   Will continue to follow for transition plans.    [name], MSN-Ed, RN,BSN [place] Acute Care Coordinator [phone] [facility]) [phone]  (Toll free office)

## 2020-05-03 DIAGNOSIS — E119 Type 2 diabetes mellitus without complications: Secondary | ICD-10-CM | POA: Diagnosis not present

## 2020-05-03 DIAGNOSIS — E785 Hyperlipidemia, unspecified: Secondary | ICD-10-CM | POA: Diagnosis not present

## 2020-05-03 DIAGNOSIS — G9341 Metabolic encephalopathy: Secondary | ICD-10-CM | POA: Diagnosis not present

## 2020-05-03 DIAGNOSIS — R296 Repeated falls: Secondary | ICD-10-CM | POA: Diagnosis not present

## 2020-05-05 ENCOUNTER — Other Ambulatory Visit: Payer: Self-pay | Admitting: *Deleted

## 2020-05-05 NOTE — Patient Outreach (Signed)
[facility]) Care Management  05/05/2020  Zyheir Daft [facility] 03-Dec-1940 [id number]  Case closure. Mr. Tilghman is currently in SNF at [facility]. He is being followed by palliative care and his wife is seeking Medicaid for LTC.  NP will remove name from care team. Case will not be opened if pt goes home with palliative care.  [name]. Myrtie Neither, MSN, [facility] Gerontological Nurse Practitioner [facility] Care Management [phone]

## 2020-05-07 ENCOUNTER — Other Ambulatory Visit: Payer: Self-pay | Admitting: Adult Health

## 2020-05-08 ENCOUNTER — Other Ambulatory Visit: Payer: Self-pay | Admitting: Adult Health

## 2020-05-12 DIAGNOSIS — S72142D Displaced intertrochanteric fracture of left femur, subsequent encounter for closed fracture with routine healing: Secondary | ICD-10-CM | POA: Diagnosis not present

## 2020-05-15 ENCOUNTER — Other Ambulatory Visit: Payer: Self-pay | Admitting: Adult Health

## 2020-05-15 ENCOUNTER — Other Ambulatory Visit: Payer: Self-pay | Admitting: *Deleted

## 2020-05-15 NOTE — Patient Outreach (Signed)
[place] Coordinator follow up  Confirmed Mr. Badders transitioned to longterm care at [facility].   No further identifiable [facility] Care Management needs at this time.    [name], MSN-Ed, RN,BSN [place] Acute Care Coordinator [phone] [facility]) [phone]  (Toll free office)

## 2020-05-24 DIAGNOSIS — R601 Generalized edema: Secondary | ICD-10-CM | POA: Diagnosis not present

## 2020-05-29 DIAGNOSIS — D649 Anemia, unspecified: Secondary | ICD-10-CM | POA: Diagnosis not present

## 2020-05-29 DIAGNOSIS — G9341 Metabolic encephalopathy: Secondary | ICD-10-CM | POA: Diagnosis not present

## 2020-05-29 DIAGNOSIS — D6852 Prothrombin gene mutation: Secondary | ICD-10-CM | POA: Diagnosis not present

## 2020-05-29 DIAGNOSIS — E1165 Type 2 diabetes mellitus with hyperglycemia: Secondary | ICD-10-CM | POA: Diagnosis not present

## 2020-06-07 ENCOUNTER — Ambulatory Visit: Payer: Medicare Other | Admitting: Orthopedic Surgery

## 2020-06-08 ENCOUNTER — Ambulatory Visit (INDEPENDENT_AMBULATORY_CARE_PROVIDER_SITE_OTHER): Payer: Medicare Other | Admitting: Orthopedic Surgery

## 2020-06-08 ENCOUNTER — Ambulatory Visit: Payer: Medicare Other

## 2020-06-08 ENCOUNTER — Other Ambulatory Visit: Payer: Self-pay

## 2020-06-08 VITALS — Ht 71.0 in

## 2020-06-08 DIAGNOSIS — S72142D Displaced intertrochanteric fracture of left femur, subsequent encounter for closed fracture with routine healing: Secondary | ICD-10-CM | POA: Diagnosis not present

## 2020-06-08 NOTE — Progress Notes (Signed)
Chief Complaint  Patient presents with  . Follow-up    Recheck on left hip fracture, DOS 03-05-20.    Left hip fracture patient is ambulating with a walker but his hip flexors are weak on exam today.  His fracture looks healed his hardware looks fine his leg alignment is normal  Recommend physical therapy to address hip flexion strength  Follow-up in 3 months  Encounter Diagnosis  Name Primary?  . Closed displaced intertrochanteric fracture of left femur with routine healing, subsequent encounter Yes

## 2020-06-13 ENCOUNTER — Non-Acute Institutional Stay: Payer: Medicare Other

## 2020-06-13 ENCOUNTER — Other Ambulatory Visit: Payer: Self-pay

## 2020-06-13 DIAGNOSIS — Z515 Encounter for palliative care: Secondary | ICD-10-CM

## 2020-06-15 DIAGNOSIS — R05 Cough: Secondary | ICD-10-CM | POA: Diagnosis not present

## 2020-06-15 NOTE — Progress Notes (Signed)
COMMUNITY PALLIATIVE CARE SW NOTE  PATIENT NAME: Christian [name] DOB: [birth year] MRN: [id number]  PRIMARY CARE PROVIDER: Carlena Hurl, PA-C  RESPONSIBLE PARTY:  Acct ID - Guarantor Home Phone Work Phone Relationship Acct Type  [id number] Reatha [phone]  Self P/F     [address], [place], [place] [address]     PLAN OF CARE and INTERVENTIONS:             1. GOALS OF CARE/ ADVANCE CARE PLANNING:  Patient is a DNR, form on the chart. Goal is for patient to remain at the current placement. 2. SOCIAL/EMOTIONAL/SPIRITUAL ASSESSMENT/ INTERVENTIONS:   SW completed initial visit with patient at the facility [facility] SN[facility]). He was sitting up in his wheelchair. Patient denied pain. He reported that he was eating and sleeping well. SW engaged in life review, however he seemed to have difficulty recalling events and repeated himself often. Patient asked staff where was his wife and did not remember that she was visiting him daily. SW consulted with the nurse-Joy. She reported that patient's sugar levels have been stable and they are managing it with the medication. He has swelling in his legs that have been persistent, but is better overall. SW observed patient drinking water throughout the visit. The facility nurse-Joy also report that patient was not coming out of his room initially, but now he is. He is going down for meals and activities on his won. He is dependent for personal care needs. SW provided supportive presence, active listening, life review, observation, consult with staff.   3. ATIENT/CAREGIVER EDUCATION/ COPING:  Patient appeared to be alert and oriented to self and situation. He appears to be adjusting well to the facility and permanent status. His wife is very supportive.  4. PERSONAL EMERGENCY PLAN:  Per facility protocol.  5. COMMUNITY RESOURCES COORDINATION/ HEALTH CARE NAVIGATION:  Patient has support and access to social activities/support at the facility.   6. FINANCIAL/LEGAL CONCERNS/INTERVENTIONS:  No financial or legal issues presented.      SOCIAL HX:  Social History   Tobacco Use  . Smoking status: Former Smoker    Packs/day: 1.00    Years: 10.00    Pack years: 10.00    Types: Cigarettes    Quit date: 11/25/2004    Years since quitting: 15.5  . Smokeless tobacco: Never Used  Substance Use Topics  . Alcohol use: Yes    Alcohol/week: 1.0 standard drink    Types: 1 Glasses of wine per week    Comment: once a month per pt     CODE STATUS: DNR, on the chart ADVANCED DIRECTIVES: No MOST FORM COMPLETE:  Yes, on the chart HOSPICE EDUCATION PROVIDED: NO  PPS: Patient is ambulating himself in his wheelchair. He is alert and oriented to self and situation, but is forgetful and repeats himself. He is dependent for ADL's, but can feed himself.   Duration of visit and documentation: 45 minutes.      [address] [place], [place]

## 2020-06-19 DIAGNOSIS — E119 Type 2 diabetes mellitus without complications: Secondary | ICD-10-CM | POA: Diagnosis not present

## 2020-06-19 DIAGNOSIS — D649 Anemia, unspecified: Secondary | ICD-10-CM | POA: Diagnosis not present

## 2020-06-19 DIAGNOSIS — I10 Essential (primary) hypertension: Secondary | ICD-10-CM | POA: Diagnosis not present

## 2020-06-19 DIAGNOSIS — R5383 Other fatigue: Secondary | ICD-10-CM | POA: Diagnosis not present

## 2020-06-20 DIAGNOSIS — E538 Deficiency of other specified B group vitamins: Secondary | ICD-10-CM | POA: Diagnosis not present

## 2020-06-20 DIAGNOSIS — F419 Anxiety disorder, unspecified: Secondary | ICD-10-CM | POA: Diagnosis not present

## 2020-06-20 DIAGNOSIS — F329 Major depressive disorder, single episode, unspecified: Secondary | ICD-10-CM | POA: Diagnosis not present

## 2020-06-20 DIAGNOSIS — D649 Anemia, unspecified: Secondary | ICD-10-CM | POA: Diagnosis not present

## 2020-06-20 DIAGNOSIS — M6281 Muscle weakness (generalized): Secondary | ICD-10-CM | POA: Diagnosis not present

## 2020-06-20 DIAGNOSIS — E1165 Type 2 diabetes mellitus with hyperglycemia: Secondary | ICD-10-CM | POA: Diagnosis not present

## 2020-06-21 DIAGNOSIS — M79671 Pain in right foot: Secondary | ICD-10-CM | POA: Diagnosis not present

## 2020-06-21 DIAGNOSIS — I10 Essential (primary) hypertension: Secondary | ICD-10-CM | POA: Diagnosis not present

## 2020-06-21 DIAGNOSIS — R5383 Other fatigue: Secondary | ICD-10-CM | POA: Diagnosis not present

## 2020-06-21 DIAGNOSIS — E119 Type 2 diabetes mellitus without complications: Secondary | ICD-10-CM | POA: Diagnosis not present

## 2020-06-21 DIAGNOSIS — D649 Anemia, unspecified: Secondary | ICD-10-CM | POA: Diagnosis not present

## 2020-06-22 DIAGNOSIS — R9430 Abnormal result of cardiovascular function study, unspecified: Secondary | ICD-10-CM | POA: Diagnosis not present

## 2020-06-26 DIAGNOSIS — R296 Repeated falls: Secondary | ICD-10-CM | POA: Diagnosis not present

## 2020-06-26 DIAGNOSIS — R0989 Other specified symptoms and signs involving the circulatory and respiratory systems: Secondary | ICD-10-CM | POA: Diagnosis not present

## 2020-06-26 DIAGNOSIS — E119 Type 2 diabetes mellitus without complications: Secondary | ICD-10-CM | POA: Diagnosis not present

## 2020-06-26 DIAGNOSIS — R531 Weakness: Secondary | ICD-10-CM | POA: Diagnosis not present

## 2020-06-26 DIAGNOSIS — D649 Anemia, unspecified: Secondary | ICD-10-CM | POA: Diagnosis not present

## 2020-06-26 DIAGNOSIS — I10 Essential (primary) hypertension: Secondary | ICD-10-CM | POA: Diagnosis not present

## 2020-06-30 DIAGNOSIS — L570 Actinic keratosis: Secondary | ICD-10-CM | POA: Diagnosis not present

## 2020-07-06 DIAGNOSIS — I10 Essential (primary) hypertension: Secondary | ICD-10-CM | POA: Diagnosis not present

## 2020-07-06 DIAGNOSIS — R0989 Other specified symptoms and signs involving the circulatory and respiratory systems: Secondary | ICD-10-CM | POA: Diagnosis not present

## 2020-07-06 DIAGNOSIS — M6281 Muscle weakness (generalized): Secondary | ICD-10-CM | POA: Diagnosis not present

## 2020-07-06 DIAGNOSIS — F0390 Unspecified dementia without behavioral disturbance: Secondary | ICD-10-CM | POA: Diagnosis not present

## 2020-07-06 DIAGNOSIS — I482 Chronic atrial fibrillation, unspecified: Secondary | ICD-10-CM | POA: Diagnosis not present

## 2020-07-06 DIAGNOSIS — R41841 Cognitive communication deficit: Secondary | ICD-10-CM | POA: Diagnosis not present

## 2020-07-06 DIAGNOSIS — E785 Hyperlipidemia, unspecified: Secondary | ICD-10-CM | POA: Diagnosis not present

## 2020-07-06 DIAGNOSIS — E119 Type 2 diabetes mellitus without complications: Secondary | ICD-10-CM | POA: Diagnosis not present

## 2020-07-07 DIAGNOSIS — R41841 Cognitive communication deficit: Secondary | ICD-10-CM | POA: Diagnosis not present

## 2020-07-07 DIAGNOSIS — M6281 Muscle weakness (generalized): Secondary | ICD-10-CM | POA: Diagnosis not present

## 2020-07-08 DIAGNOSIS — R41841 Cognitive communication deficit: Secondary | ICD-10-CM | POA: Diagnosis not present

## 2020-07-08 DIAGNOSIS — M6281 Muscle weakness (generalized): Secondary | ICD-10-CM | POA: Diagnosis not present

## 2020-07-11 DIAGNOSIS — F0390 Unspecified dementia without behavioral disturbance: Secondary | ICD-10-CM | POA: Diagnosis not present

## 2020-07-11 DIAGNOSIS — F331 Major depressive disorder, recurrent, moderate: Secondary | ICD-10-CM | POA: Diagnosis not present

## 2020-07-11 DIAGNOSIS — M6281 Muscle weakness (generalized): Secondary | ICD-10-CM | POA: Diagnosis not present

## 2020-07-11 DIAGNOSIS — R41841 Cognitive communication deficit: Secondary | ICD-10-CM | POA: Diagnosis not present

## 2020-07-11 DIAGNOSIS — F419 Anxiety disorder, unspecified: Secondary | ICD-10-CM | POA: Diagnosis not present

## 2020-07-11 DIAGNOSIS — I10 Essential (primary) hypertension: Secondary | ICD-10-CM | POA: Diagnosis not present

## 2020-07-12 DIAGNOSIS — M6281 Muscle weakness (generalized): Secondary | ICD-10-CM | POA: Diagnosis not present

## 2020-07-12 DIAGNOSIS — R41841 Cognitive communication deficit: Secondary | ICD-10-CM | POA: Diagnosis not present

## 2020-07-13 DIAGNOSIS — R41841 Cognitive communication deficit: Secondary | ICD-10-CM | POA: Diagnosis not present

## 2020-07-13 DIAGNOSIS — M6281 Muscle weakness (generalized): Secondary | ICD-10-CM | POA: Diagnosis not present

## 2020-07-14 DIAGNOSIS — M6281 Muscle weakness (generalized): Secondary | ICD-10-CM | POA: Diagnosis not present

## 2020-07-14 DIAGNOSIS — R41841 Cognitive communication deficit: Secondary | ICD-10-CM | POA: Diagnosis not present

## 2020-07-15 DIAGNOSIS — M6281 Muscle weakness (generalized): Secondary | ICD-10-CM | POA: Diagnosis not present

## 2020-07-15 DIAGNOSIS — R41841 Cognitive communication deficit: Secondary | ICD-10-CM | POA: Diagnosis not present

## 2020-07-17 DIAGNOSIS — E119 Type 2 diabetes mellitus without complications: Secondary | ICD-10-CM | POA: Diagnosis not present

## 2020-07-17 DIAGNOSIS — D649 Anemia, unspecified: Secondary | ICD-10-CM | POA: Diagnosis not present

## 2020-07-17 DIAGNOSIS — R0989 Other specified symptoms and signs involving the circulatory and respiratory systems: Secondary | ICD-10-CM | POA: Diagnosis not present

## 2020-07-17 DIAGNOSIS — I10 Essential (primary) hypertension: Secondary | ICD-10-CM | POA: Diagnosis not present

## 2020-07-17 DIAGNOSIS — I739 Peripheral vascular disease, unspecified: Secondary | ICD-10-CM | POA: Diagnosis not present

## 2020-07-18 DIAGNOSIS — M6281 Muscle weakness (generalized): Secondary | ICD-10-CM | POA: Diagnosis not present

## 2020-07-18 DIAGNOSIS — R41841 Cognitive communication deficit: Secondary | ICD-10-CM | POA: Diagnosis not present

## 2020-07-19 DIAGNOSIS — F0390 Unspecified dementia without behavioral disturbance: Secondary | ICD-10-CM | POA: Diagnosis not present

## 2020-07-19 DIAGNOSIS — I482 Chronic atrial fibrillation, unspecified: Secondary | ICD-10-CM | POA: Diagnosis not present

## 2020-07-19 DIAGNOSIS — R41841 Cognitive communication deficit: Secondary | ICD-10-CM | POA: Diagnosis not present

## 2020-07-19 DIAGNOSIS — I739 Peripheral vascular disease, unspecified: Secondary | ICD-10-CM | POA: Diagnosis not present

## 2020-07-19 DIAGNOSIS — M6281 Muscle weakness (generalized): Secondary | ICD-10-CM | POA: Diagnosis not present

## 2020-07-19 DIAGNOSIS — L570 Actinic keratosis: Secondary | ICD-10-CM | POA: Diagnosis not present

## 2020-07-19 DIAGNOSIS — R0989 Other specified symptoms and signs involving the circulatory and respiratory systems: Secondary | ICD-10-CM | POA: Diagnosis not present

## 2020-07-20 DIAGNOSIS — M6281 Muscle weakness (generalized): Secondary | ICD-10-CM | POA: Diagnosis not present

## 2020-07-20 DIAGNOSIS — R41841 Cognitive communication deficit: Secondary | ICD-10-CM | POA: Diagnosis not present

## 2020-07-20 DIAGNOSIS — I482 Chronic atrial fibrillation, unspecified: Secondary | ICD-10-CM | POA: Diagnosis not present

## 2020-07-21 DIAGNOSIS — M6281 Muscle weakness (generalized): Secondary | ICD-10-CM | POA: Diagnosis not present

## 2020-07-21 DIAGNOSIS — R41841 Cognitive communication deficit: Secondary | ICD-10-CM | POA: Diagnosis not present

## 2020-07-22 DIAGNOSIS — M6281 Muscle weakness (generalized): Secondary | ICD-10-CM | POA: Diagnosis not present

## 2020-07-22 DIAGNOSIS — R41841 Cognitive communication deficit: Secondary | ICD-10-CM | POA: Diagnosis not present

## 2020-07-23 DIAGNOSIS — M6281 Muscle weakness (generalized): Secondary | ICD-10-CM | POA: Diagnosis not present

## 2020-07-23 DIAGNOSIS — R41841 Cognitive communication deficit: Secondary | ICD-10-CM | POA: Diagnosis not present

## 2020-07-24 DIAGNOSIS — M6281 Muscle weakness (generalized): Secondary | ICD-10-CM | POA: Diagnosis not present

## 2020-07-24 DIAGNOSIS — R41841 Cognitive communication deficit: Secondary | ICD-10-CM | POA: Diagnosis not present

## 2020-07-25 DIAGNOSIS — R41841 Cognitive communication deficit: Secondary | ICD-10-CM | POA: Diagnosis not present

## 2020-07-25 DIAGNOSIS — M6281 Muscle weakness (generalized): Secondary | ICD-10-CM | POA: Diagnosis not present

## 2020-07-26 DIAGNOSIS — R41841 Cognitive communication deficit: Secondary | ICD-10-CM | POA: Diagnosis not present

## 2020-07-26 DIAGNOSIS — M6281 Muscle weakness (generalized): Secondary | ICD-10-CM | POA: Diagnosis not present

## 2020-07-27 DIAGNOSIS — R41841 Cognitive communication deficit: Secondary | ICD-10-CM | POA: Diagnosis not present

## 2020-07-27 DIAGNOSIS — M6281 Muscle weakness (generalized): Secondary | ICD-10-CM | POA: Diagnosis not present

## 2020-07-28 DIAGNOSIS — R41841 Cognitive communication deficit: Secondary | ICD-10-CM | POA: Diagnosis not present

## 2020-07-28 DIAGNOSIS — M6281 Muscle weakness (generalized): Secondary | ICD-10-CM | POA: Diagnosis not present

## 2020-07-31 DIAGNOSIS — M6281 Muscle weakness (generalized): Secondary | ICD-10-CM | POA: Diagnosis not present

## 2020-07-31 DIAGNOSIS — R41841 Cognitive communication deficit: Secondary | ICD-10-CM | POA: Diagnosis not present

## 2020-08-01 DIAGNOSIS — R41841 Cognitive communication deficit: Secondary | ICD-10-CM | POA: Diagnosis not present

## 2020-08-01 DIAGNOSIS — M6281 Muscle weakness (generalized): Secondary | ICD-10-CM | POA: Diagnosis not present

## 2020-08-02 DIAGNOSIS — M6281 Muscle weakness (generalized): Secondary | ICD-10-CM | POA: Diagnosis not present

## 2020-08-02 DIAGNOSIS — R41841 Cognitive communication deficit: Secondary | ICD-10-CM | POA: Diagnosis not present

## 2020-08-03 DIAGNOSIS — R41841 Cognitive communication deficit: Secondary | ICD-10-CM | POA: Diagnosis not present

## 2020-08-03 DIAGNOSIS — I482 Chronic atrial fibrillation, unspecified: Secondary | ICD-10-CM | POA: Diagnosis not present

## 2020-08-03 DIAGNOSIS — D649 Anemia, unspecified: Secondary | ICD-10-CM | POA: Diagnosis not present

## 2020-08-03 DIAGNOSIS — K5900 Constipation, unspecified: Secondary | ICD-10-CM | POA: Diagnosis not present

## 2020-08-03 DIAGNOSIS — J209 Acute bronchitis, unspecified: Secondary | ICD-10-CM | POA: Diagnosis not present

## 2020-08-03 DIAGNOSIS — R600 Localized edema: Secondary | ICD-10-CM | POA: Diagnosis not present

## 2020-08-03 DIAGNOSIS — M6281 Muscle weakness (generalized): Secondary | ICD-10-CM | POA: Diagnosis not present

## 2020-08-04 DIAGNOSIS — M6281 Muscle weakness (generalized): Secondary | ICD-10-CM | POA: Diagnosis not present

## 2020-08-04 DIAGNOSIS — R41841 Cognitive communication deficit: Secondary | ICD-10-CM | POA: Diagnosis not present

## 2020-08-05 DIAGNOSIS — M6281 Muscle weakness (generalized): Secondary | ICD-10-CM | POA: Diagnosis not present

## 2020-08-05 DIAGNOSIS — R41841 Cognitive communication deficit: Secondary | ICD-10-CM | POA: Diagnosis not present

## 2020-08-06 DIAGNOSIS — M6281 Muscle weakness (generalized): Secondary | ICD-10-CM | POA: Diagnosis not present

## 2020-08-06 DIAGNOSIS — R41841 Cognitive communication deficit: Secondary | ICD-10-CM | POA: Diagnosis not present

## 2020-08-08 DIAGNOSIS — F0390 Unspecified dementia without behavioral disturbance: Secondary | ICD-10-CM | POA: Diagnosis not present

## 2020-08-08 DIAGNOSIS — F331 Major depressive disorder, recurrent, moderate: Secondary | ICD-10-CM | POA: Diagnosis not present

## 2020-08-08 DIAGNOSIS — F419 Anxiety disorder, unspecified: Secondary | ICD-10-CM | POA: Diagnosis not present

## 2020-08-08 DIAGNOSIS — M6281 Muscle weakness (generalized): Secondary | ICD-10-CM | POA: Diagnosis not present

## 2020-08-08 DIAGNOSIS — R41841 Cognitive communication deficit: Secondary | ICD-10-CM | POA: Diagnosis not present

## 2020-08-09 DIAGNOSIS — R41841 Cognitive communication deficit: Secondary | ICD-10-CM | POA: Diagnosis not present

## 2020-08-09 DIAGNOSIS — M6281 Muscle weakness (generalized): Secondary | ICD-10-CM | POA: Diagnosis not present

## 2020-08-10 DIAGNOSIS — R41841 Cognitive communication deficit: Secondary | ICD-10-CM | POA: Diagnosis not present

## 2020-08-10 DIAGNOSIS — M6281 Muscle weakness (generalized): Secondary | ICD-10-CM | POA: Diagnosis not present

## 2020-08-11 DIAGNOSIS — R41841 Cognitive communication deficit: Secondary | ICD-10-CM | POA: Diagnosis not present

## 2020-08-11 DIAGNOSIS — M6281 Muscle weakness (generalized): Secondary | ICD-10-CM | POA: Diagnosis not present

## 2020-08-12 DIAGNOSIS — M6281 Muscle weakness (generalized): Secondary | ICD-10-CM | POA: Diagnosis not present

## 2020-08-12 DIAGNOSIS — R41841 Cognitive communication deficit: Secondary | ICD-10-CM | POA: Diagnosis not present

## 2020-08-14 ENCOUNTER — Ambulatory Visit (INDEPENDENT_AMBULATORY_CARE_PROVIDER_SITE_OTHER): Payer: Medicare Other | Admitting: Surgery

## 2020-08-14 ENCOUNTER — Encounter: Payer: Self-pay | Admitting: Surgery

## 2020-08-14 ENCOUNTER — Other Ambulatory Visit: Payer: Self-pay

## 2020-08-14 VITALS — BP 93/58 | HR 89 | Temp 98.0°F | Resp 20 | Ht 71.0 in | Wt 234.2 lb

## 2020-08-14 DIAGNOSIS — I70213 Atherosclerosis of native arteries of extremities with intermittent claudication, bilateral legs: Secondary | ICD-10-CM

## 2020-08-14 DIAGNOSIS — M7989 Other specified soft tissue disorders: Secondary | ICD-10-CM

## 2020-08-14 DIAGNOSIS — M6281 Muscle weakness (generalized): Secondary | ICD-10-CM | POA: Diagnosis not present

## 2020-08-14 DIAGNOSIS — R41841 Cognitive communication deficit: Secondary | ICD-10-CM | POA: Diagnosis not present
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
## 2020-08-15 DIAGNOSIS — R41841 Cognitive communication deficit: Secondary | ICD-10-CM | POA: Diagnosis not present

## 2020-08-15 DIAGNOSIS — M6281 Muscle weakness (generalized): Secondary | ICD-10-CM | POA: Diagnosis not present

## 2020-08-16 DIAGNOSIS — M6281 Muscle weakness (generalized): Secondary | ICD-10-CM | POA: Diagnosis not present

## 2020-08-16 DIAGNOSIS — R41841 Cognitive communication deficit: Secondary | ICD-10-CM | POA: Diagnosis not present

## 2020-08-17 DIAGNOSIS — N400 Enlarged prostate without lower urinary tract symptoms: Secondary | ICD-10-CM | POA: Diagnosis not present

## 2020-08-17 DIAGNOSIS — I10 Essential (primary) hypertension: Secondary | ICD-10-CM | POA: Diagnosis not present

## 2020-08-17 DIAGNOSIS — E119 Type 2 diabetes mellitus without complications: Secondary | ICD-10-CM | POA: Diagnosis not present

## 2020-08-17 DIAGNOSIS — F4321 Adjustment disorder with depressed mood: Secondary | ICD-10-CM | POA: Diagnosis not present

## 2020-08-17 DIAGNOSIS — R41841 Cognitive communication deficit: Secondary | ICD-10-CM | POA: Diagnosis not present

## 2020-08-17 DIAGNOSIS — D649 Anemia, unspecified: Secondary | ICD-10-CM | POA: Diagnosis not present

## 2020-08-17 DIAGNOSIS — F329 Major depressive disorder, single episode, unspecified: Secondary | ICD-10-CM | POA: Diagnosis not present

## 2020-08-17 DIAGNOSIS — F0390 Unspecified dementia without behavioral disturbance: Secondary | ICD-10-CM | POA: Diagnosis not present

## 2020-08-17 DIAGNOSIS — M6281 Muscle weakness (generalized): Secondary | ICD-10-CM | POA: Diagnosis not present

## 2020-08-17 DIAGNOSIS — E785 Hyperlipidemia, unspecified: Secondary | ICD-10-CM | POA: Diagnosis not present

## 2020-08-18 DIAGNOSIS — M6281 Muscle weakness (generalized): Secondary | ICD-10-CM | POA: Diagnosis not present

## 2020-08-18 DIAGNOSIS — R41841 Cognitive communication deficit: Secondary | ICD-10-CM | POA: Diagnosis not present

## 2020-08-21 DIAGNOSIS — M6281 Muscle weakness (generalized): Secondary | ICD-10-CM | POA: Diagnosis not present

## 2020-08-21 DIAGNOSIS — R41841 Cognitive communication deficit: Secondary | ICD-10-CM | POA: Diagnosis not present

## 2020-08-21 DIAGNOSIS — I10 Essential (primary) hypertension: Secondary | ICD-10-CM | POA: Diagnosis not present

## 2020-08-21 DIAGNOSIS — I739 Peripheral vascular disease, unspecified: Secondary | ICD-10-CM | POA: Diagnosis not present

## 2020-08-21 DIAGNOSIS — D649 Anemia, unspecified: Secondary | ICD-10-CM | POA: Diagnosis not present

## 2020-08-21 DIAGNOSIS — E119 Type 2 diabetes mellitus without complications: Secondary | ICD-10-CM | POA: Diagnosis not present

## 2020-08-21 DIAGNOSIS — R0989 Other specified symptoms and signs involving the circulatory and respiratory systems: Secondary | ICD-10-CM | POA: Diagnosis not present

## 2020-08-22 DIAGNOSIS — R41841 Cognitive communication deficit: Secondary | ICD-10-CM | POA: Diagnosis not present

## 2020-08-22 DIAGNOSIS — F329 Major depressive disorder, single episode, unspecified: Secondary | ICD-10-CM | POA: Diagnosis not present

## 2020-08-22 DIAGNOSIS — F419 Anxiety disorder, unspecified: Secondary | ICD-10-CM | POA: Diagnosis not present

## 2020-08-22 DIAGNOSIS — M6281 Muscle weakness (generalized): Secondary | ICD-10-CM | POA: Diagnosis not present

## 2020-08-23 DIAGNOSIS — M6281 Muscle weakness (generalized): Secondary | ICD-10-CM | POA: Diagnosis not present

## 2020-08-23 DIAGNOSIS — R41841 Cognitive communication deficit: Secondary | ICD-10-CM | POA: Diagnosis not present

## 2020-08-24 DIAGNOSIS — R41841 Cognitive communication deficit: Secondary | ICD-10-CM | POA: Diagnosis not present

## 2020-08-24 DIAGNOSIS — M6281 Muscle weakness (generalized): Secondary | ICD-10-CM | POA: Diagnosis not present

## 2020-08-25 DIAGNOSIS — M6281 Muscle weakness (generalized): Secondary | ICD-10-CM | POA: Diagnosis not present

## 2020-08-25 DIAGNOSIS — R41841 Cognitive communication deficit: Secondary | ICD-10-CM | POA: Diagnosis not present

## 2020-08-27 DIAGNOSIS — R41841 Cognitive communication deficit: Secondary | ICD-10-CM | POA: Diagnosis not present

## 2020-08-27 DIAGNOSIS — M6281 Muscle weakness (generalized): Secondary | ICD-10-CM | POA: Diagnosis not present

## 2020-08-28 DIAGNOSIS — I482 Chronic atrial fibrillation, unspecified: Secondary | ICD-10-CM | POA: Diagnosis not present

## 2020-08-28 DIAGNOSIS — M6281 Muscle weakness (generalized): Secondary | ICD-10-CM | POA: Diagnosis not present

## 2020-08-28 DIAGNOSIS — R41841 Cognitive communication deficit: Secondary | ICD-10-CM | POA: Diagnosis not present

## 2020-08-29 DIAGNOSIS — M6281 Muscle weakness (generalized): Secondary | ICD-10-CM | POA: Diagnosis not present

## 2020-08-29 DIAGNOSIS — R41841 Cognitive communication deficit: Secondary | ICD-10-CM | POA: Diagnosis not present

## 2020-08-31 DIAGNOSIS — R41841 Cognitive communication deficit: Secondary | ICD-10-CM | POA: Diagnosis not present

## 2020-08-31 DIAGNOSIS — M6281 Muscle weakness (generalized): Secondary | ICD-10-CM | POA: Diagnosis not present

## 2020-09-01 DIAGNOSIS — R41841 Cognitive communication deficit: Secondary | ICD-10-CM | POA: Diagnosis not present

## 2020-09-01 DIAGNOSIS — M6281 Muscle weakness (generalized): Secondary | ICD-10-CM | POA: Diagnosis not present

## 2020-09-04 DIAGNOSIS — L821 Other seborrheic keratosis: Secondary | ICD-10-CM | POA: Diagnosis not present

## 2020-09-04 DIAGNOSIS — L905 Scar conditions and fibrosis of skin: Secondary | ICD-10-CM | POA: Diagnosis not present

## 2020-09-04 DIAGNOSIS — D1801 Hemangioma of skin and subcutaneous tissue: Secondary | ICD-10-CM | POA: Diagnosis not present

## 2020-09-05 DIAGNOSIS — M6281 Muscle weakness (generalized): Secondary | ICD-10-CM | POA: Diagnosis not present

## 2020-09-05 DIAGNOSIS — R41841 Cognitive communication deficit: Secondary | ICD-10-CM | POA: Diagnosis not present

## 2020-09-06 DIAGNOSIS — M6281 Muscle weakness (generalized): Secondary | ICD-10-CM | POA: Diagnosis not present

## 2020-09-06 DIAGNOSIS — R41841 Cognitive communication deficit: Secondary | ICD-10-CM | POA: Diagnosis not present

## 2020-09-07 ENCOUNTER — Encounter: Payer: Self-pay | Admitting: Orthopedic Surgery

## 2020-09-07 ENCOUNTER — Other Ambulatory Visit: Payer: Self-pay

## 2020-09-07 ENCOUNTER — Ambulatory Visit (INDEPENDENT_AMBULATORY_CARE_PROVIDER_SITE_OTHER): Payer: Medicare Other | Admitting: Orthopedic Surgery

## 2020-09-07 VITALS — BP 111/62 | HR 78 | Ht 71.0 in | Wt 234.0 lb

## 2020-09-07 DIAGNOSIS — M6281 Muscle weakness (generalized): Secondary | ICD-10-CM | POA: Diagnosis not present

## 2020-09-07 DIAGNOSIS — S72142D Displaced intertrochanteric fracture of left femur, subsequent encounter for closed fracture with routine healing: Secondary | ICD-10-CM | POA: Diagnosis not present

## 2020-09-07 DIAGNOSIS — I70213 Atherosclerosis of native arteries of extremities with intermittent claudication, bilateral legs: Secondary | ICD-10-CM | POA: Diagnosis not present

## 2020-09-07 DIAGNOSIS — R41841 Cognitive communication deficit: Secondary | ICD-10-CM | POA: Diagnosis not present

## 2020-09-07 NOTE — Progress Notes (Signed)
Chief Complaint  Patient presents with  . Routine Post Op    03/05/20 left hip fracture IM nail    FU CK  Encounter Diagnosis  Name Primary?  . Closed displaced intertrochanteric fracture of left femur with routine healing, subsequent encounter 03/05/20 left hip IM nail  Yes    HIP FLEXION   Seems to be doing better with his hip flexion.  The person with him today says he was walking with therapy  Our plan is to progress as tolerated follow-up as needed

## 2020-09-12 DIAGNOSIS — F4321 Adjustment disorder with depressed mood: Secondary | ICD-10-CM | POA: Diagnosis not present

## 2020-09-12 DIAGNOSIS — F0390 Unspecified dementia without behavioral disturbance: Secondary | ICD-10-CM | POA: Diagnosis not present

## 2020-09-12 DIAGNOSIS — N400 Enlarged prostate without lower urinary tract symptoms: Secondary | ICD-10-CM | POA: Diagnosis not present

## 2020-09-12 DIAGNOSIS — I10 Essential (primary) hypertension: Secondary | ICD-10-CM | POA: Diagnosis not present

## 2020-09-12 DIAGNOSIS — R41841 Cognitive communication deficit: Secondary | ICD-10-CM | POA: Diagnosis not present

## 2020-09-12 DIAGNOSIS — E119 Type 2 diabetes mellitus without complications: Secondary | ICD-10-CM | POA: Diagnosis not present

## 2020-09-12 DIAGNOSIS — D649 Anemia, unspecified: Secondary | ICD-10-CM | POA: Diagnosis not present

## 2020-09-12 DIAGNOSIS — E785 Hyperlipidemia, unspecified: Secondary | ICD-10-CM | POA: Diagnosis not present

## 2020-09-12 DIAGNOSIS — M6281 Muscle weakness (generalized): Secondary | ICD-10-CM | POA: Diagnosis not present

## 2020-09-12 DIAGNOSIS — F329 Major depressive disorder, single episode, unspecified: Secondary | ICD-10-CM | POA: Diagnosis not present

## 2020-09-13 DIAGNOSIS — H6123 Impacted cerumen, bilateral: Secondary | ICD-10-CM | POA: Diagnosis not present

## 2020-09-13 DIAGNOSIS — L570 Actinic keratosis: Secondary | ICD-10-CM | POA: Diagnosis not present

## 2020-09-13 DIAGNOSIS — M6281 Muscle weakness (generalized): Secondary | ICD-10-CM | POA: Diagnosis not present

## 2020-09-13 DIAGNOSIS — I739 Peripheral vascular disease, unspecified: Secondary | ICD-10-CM | POA: Diagnosis not present

## 2020-09-13 DIAGNOSIS — R41841 Cognitive communication deficit: Secondary | ICD-10-CM | POA: Diagnosis not present

## 2020-09-14 DIAGNOSIS — R41841 Cognitive communication deficit: Secondary | ICD-10-CM | POA: Diagnosis not present

## 2020-09-14 DIAGNOSIS — M6281 Muscle weakness (generalized): Secondary | ICD-10-CM | POA: Diagnosis not present

## 2020-09-18 DIAGNOSIS — I10 Essential (primary) hypertension: Secondary | ICD-10-CM | POA: Diagnosis not present

## 2020-09-18 DIAGNOSIS — I739 Peripheral vascular disease, unspecified: Secondary | ICD-10-CM | POA: Diagnosis not present

## 2020-09-18 DIAGNOSIS — D649 Anemia, unspecified: Secondary | ICD-10-CM | POA: Diagnosis not present

## 2020-09-18 DIAGNOSIS — F329 Major depressive disorder, single episode, unspecified: Secondary | ICD-10-CM | POA: Diagnosis not present

## 2020-09-18 DIAGNOSIS — E119 Type 2 diabetes mellitus without complications: Secondary | ICD-10-CM | POA: Diagnosis not present

## 2020-09-18 DIAGNOSIS — R41841 Cognitive communication deficit: Secondary | ICD-10-CM | POA: Diagnosis not present

## 2020-09-18 DIAGNOSIS — M6281 Muscle weakness (generalized): Secondary | ICD-10-CM | POA: Diagnosis not present

## 2020-09-19 DIAGNOSIS — R41841 Cognitive communication deficit: Secondary | ICD-10-CM | POA: Diagnosis not present

## 2020-09-19 DIAGNOSIS — R0602 Shortness of breath: Secondary | ICD-10-CM | POA: Diagnosis not present

## 2020-09-19 DIAGNOSIS — M6281 Muscle weakness (generalized): Secondary | ICD-10-CM | POA: Diagnosis not present

## 2020-09-19 DIAGNOSIS — I482 Chronic atrial fibrillation, unspecified: Secondary | ICD-10-CM | POA: Diagnosis not present

## 2020-09-21 DIAGNOSIS — R41841 Cognitive communication deficit: Secondary | ICD-10-CM | POA: Diagnosis not present

## 2020-09-21 DIAGNOSIS — M6281 Muscle weakness (generalized): Secondary | ICD-10-CM | POA: Diagnosis not present

## 2020-09-21 DIAGNOSIS — I482 Chronic atrial fibrillation, unspecified: Secondary | ICD-10-CM | POA: Diagnosis not present

## 2020-09-22 DIAGNOSIS — R41841 Cognitive communication deficit: Secondary | ICD-10-CM | POA: Diagnosis not present

## 2020-09-22 DIAGNOSIS — M6281 Muscle weakness (generalized): Secondary | ICD-10-CM | POA: Diagnosis not present

## 2020-09-25 DIAGNOSIS — F0390 Unspecified dementia without behavioral disturbance: Secondary | ICD-10-CM | POA: Diagnosis not present

## 2020-09-25 DIAGNOSIS — K5900 Constipation, unspecified: Secondary | ICD-10-CM | POA: Diagnosis not present

## 2020-09-25 DIAGNOSIS — I482 Chronic atrial fibrillation, unspecified: Secondary | ICD-10-CM | POA: Diagnosis not present

## 2020-09-25 DIAGNOSIS — R5381 Other malaise: Secondary | ICD-10-CM | POA: Diagnosis not present

## 2020-09-25 DIAGNOSIS — D6852 Prothrombin gene mutation: Secondary | ICD-10-CM | POA: Diagnosis not present

## 2020-09-25 DIAGNOSIS — F329 Major depressive disorder, single episode, unspecified: Secondary | ICD-10-CM | POA: Diagnosis not present

## 2020-09-25 DIAGNOSIS — E538 Deficiency of other specified B group vitamins: Secondary | ICD-10-CM | POA: Diagnosis not present

## 2020-09-25 DIAGNOSIS — Z9181 History of falling: Secondary | ICD-10-CM | POA: Diagnosis not present

## 2020-09-25 DIAGNOSIS — M25461 Effusion, right knee: Secondary | ICD-10-CM | POA: Diagnosis not present

## 2020-09-25 DIAGNOSIS — R609 Edema, unspecified: Secondary | ICD-10-CM | POA: Diagnosis not present

## 2020-09-25 DIAGNOSIS — D72829 Elevated white blood cell count, unspecified: Secondary | ICD-10-CM | POA: Diagnosis not present

## 2020-09-25 DIAGNOSIS — E1169 Type 2 diabetes mellitus with other specified complication: Secondary | ICD-10-CM | POA: Diagnosis not present

## 2020-09-25 DIAGNOSIS — D529 Folate deficiency anemia, unspecified: Secondary | ICD-10-CM | POA: Diagnosis not present

## 2020-09-25 DIAGNOSIS — M6281 Muscle weakness (generalized): Secondary | ICD-10-CM | POA: Diagnosis not present

## 2020-09-25 DIAGNOSIS — R1084 Generalized abdominal pain: Secondary | ICD-10-CM | POA: Diagnosis not present

## 2020-09-25 DIAGNOSIS — R059 Cough, unspecified: Secondary | ICD-10-CM | POA: Diagnosis not present

## 2020-09-25 DIAGNOSIS — I739 Peripheral vascular disease, unspecified: Secondary | ICD-10-CM | POA: Diagnosis not present

## 2020-09-25 DIAGNOSIS — H6123 Impacted cerumen, bilateral: Secondary | ICD-10-CM | POA: Diagnosis not present

## 2020-09-25 DIAGNOSIS — L570 Actinic keratosis: Secondary | ICD-10-CM | POA: Diagnosis not present

## 2020-09-25 DIAGNOSIS — H6093 Unspecified otitis externa, bilateral: Secondary | ICD-10-CM | POA: Diagnosis not present

## 2020-09-25 DIAGNOSIS — R109 Unspecified abdominal pain: Secondary | ICD-10-CM | POA: Diagnosis not present

## 2020-09-25 DIAGNOSIS — F4321 Adjustment disorder with depressed mood: Secondary | ICD-10-CM | POA: Diagnosis not present

## 2020-09-25 DIAGNOSIS — K567 Ileus, unspecified: Secondary | ICD-10-CM | POA: Diagnosis not present

## 2020-09-25 DIAGNOSIS — R1310 Dysphagia, unspecified: Secondary | ICD-10-CM | POA: Diagnosis not present

## 2020-09-25 DIAGNOSIS — I10 Essential (primary) hypertension: Secondary | ICD-10-CM | POA: Diagnosis not present

## 2020-09-25 DIAGNOSIS — F331 Major depressive disorder, recurrent, moderate: Secondary | ICD-10-CM | POA: Diagnosis not present

## 2020-09-25 DIAGNOSIS — E785 Hyperlipidemia, unspecified: Secondary | ICD-10-CM | POA: Diagnosis not present

## 2020-09-25 DIAGNOSIS — M1990 Unspecified osteoarthritis, unspecified site: Secondary | ICD-10-CM | POA: Diagnosis not present

## 2020-09-25 DIAGNOSIS — F419 Anxiety disorder, unspecified: Secondary | ICD-10-CM | POA: Diagnosis not present

## 2020-09-25 DIAGNOSIS — E119 Type 2 diabetes mellitus without complications: Secondary | ICD-10-CM | POA: Diagnosis not present

## 2020-09-25 DIAGNOSIS — R251 Tremor, unspecified: Secondary | ICD-10-CM | POA: Diagnosis not present

## 2020-09-25 DIAGNOSIS — R600 Localized edema: Secondary | ICD-10-CM | POA: Diagnosis not present

## 2020-09-25 DIAGNOSIS — Z9989 Dependence on other enabling machines and devices: Secondary | ICD-10-CM | POA: Diagnosis not present

## 2020-09-25 DIAGNOSIS — E1165 Type 2 diabetes mellitus with hyperglycemia: Secondary | ICD-10-CM | POA: Diagnosis not present

## 2020-09-25 DIAGNOSIS — R110 Nausea: Secondary | ICD-10-CM | POA: Diagnosis not present

## 2020-09-25 DIAGNOSIS — R112 Nausea with vomiting, unspecified: Secondary | ICD-10-CM | POA: Diagnosis not present

## 2020-09-25 DIAGNOSIS — N400 Enlarged prostate without lower urinary tract symptoms: Secondary | ICD-10-CM | POA: Diagnosis not present

## 2020-09-25 DIAGNOSIS — R296 Repeated falls: Secondary | ICD-10-CM | POA: Diagnosis not present

## 2020-09-25 DIAGNOSIS — R41841 Cognitive communication deficit: Secondary | ICD-10-CM | POA: Diagnosis not present

## 2020-09-25 DIAGNOSIS — G9341 Metabolic encephalopathy: Secondary | ICD-10-CM | POA: Diagnosis not present

## 2020-09-25 DIAGNOSIS — E860 Dehydration: Secondary | ICD-10-CM | POA: Diagnosis not present

## 2020-09-25 DIAGNOSIS — D509 Iron deficiency anemia, unspecified: Secondary | ICD-10-CM | POA: Diagnosis not present

## 2020-09-25 DIAGNOSIS — J40 Bronchitis, not specified as acute or chronic: Secondary | ICD-10-CM | POA: Diagnosis not present

## 2020-09-25 DIAGNOSIS — F0391 Unspecified dementia with behavioral disturbance: Secondary | ICD-10-CM | POA: Diagnosis not present

## 2020-09-25 DIAGNOSIS — G8929 Other chronic pain: Secondary | ICD-10-CM | POA: Diagnosis not present

## 2020-09-25 DIAGNOSIS — G4733 Obstructive sleep apnea (adult) (pediatric): Secondary | ICD-10-CM | POA: Diagnosis not present

## 2020-09-25 DIAGNOSIS — R531 Weakness: Secondary | ICD-10-CM | POA: Diagnosis not present

## 2020-09-25 DIAGNOSIS — D649 Anemia, unspecified: Secondary | ICD-10-CM | POA: Diagnosis not present

## 2020-09-27 DIAGNOSIS — F419 Anxiety disorder, unspecified: Secondary | ICD-10-CM | POA: Diagnosis not present

## 2020-09-27 DIAGNOSIS — F331 Major depressive disorder, recurrent, moderate: Secondary | ICD-10-CM | POA: Diagnosis not present

## 2020-09-27 DIAGNOSIS — F0391 Unspecified dementia with behavioral disturbance: Secondary | ICD-10-CM | POA: Diagnosis not present

## 2020-09-27 DIAGNOSIS — R296 Repeated falls: Secondary | ICD-10-CM | POA: Diagnosis not present

## 2020-09-27 DIAGNOSIS — E1165 Type 2 diabetes mellitus with hyperglycemia: Secondary | ICD-10-CM | POA: Diagnosis not present

## 2020-10-02 DIAGNOSIS — E119 Type 2 diabetes mellitus without complications: Secondary | ICD-10-CM | POA: Diagnosis not present

## 2020-10-02 DIAGNOSIS — F0391 Unspecified dementia with behavioral disturbance: Secondary | ICD-10-CM | POA: Diagnosis not present

## 2020-10-02 DIAGNOSIS — D649 Anemia, unspecified: Secondary | ICD-10-CM | POA: Diagnosis not present

## 2020-10-02 DIAGNOSIS — N400 Enlarged prostate without lower urinary tract symptoms: Secondary | ICD-10-CM | POA: Diagnosis not present

## 2020-10-02 DIAGNOSIS — E785 Hyperlipidemia, unspecified: Secondary | ICD-10-CM | POA: Diagnosis not present

## 2020-10-02 DIAGNOSIS — F0390 Unspecified dementia without behavioral disturbance: Secondary | ICD-10-CM | POA: Diagnosis not present

## 2020-10-02 DIAGNOSIS — F331 Major depressive disorder, recurrent, moderate: Secondary | ICD-10-CM | POA: Diagnosis not present

## 2020-10-02 DIAGNOSIS — I10 Essential (primary) hypertension: Secondary | ICD-10-CM | POA: Diagnosis not present

## 2020-10-02 DIAGNOSIS — F329 Major depressive disorder, single episode, unspecified: Secondary | ICD-10-CM | POA: Diagnosis not present

## 2020-10-02 DIAGNOSIS — F4321 Adjustment disorder with depressed mood: Secondary | ICD-10-CM | POA: Diagnosis not present

## 2020-10-05 DIAGNOSIS — R296 Repeated falls: Secondary | ICD-10-CM | POA: Diagnosis not present

## 2020-10-05 DIAGNOSIS — R531 Weakness: Secondary | ICD-10-CM | POA: Diagnosis not present

## 2020-10-05 DIAGNOSIS — E119 Type 2 diabetes mellitus without complications: Secondary | ICD-10-CM | POA: Diagnosis not present

## 2020-10-05 DIAGNOSIS — F0390 Unspecified dementia without behavioral disturbance: Secondary | ICD-10-CM | POA: Diagnosis not present

## 2020-10-11 DIAGNOSIS — H6093 Unspecified otitis externa, bilateral: Secondary | ICD-10-CM | POA: Diagnosis not present

## 2020-10-11 DIAGNOSIS — I739 Peripheral vascular disease, unspecified: Secondary | ICD-10-CM | POA: Diagnosis not present

## 2020-10-11 DIAGNOSIS — H6123 Impacted cerumen, bilateral: Secondary | ICD-10-CM | POA: Diagnosis not present

## 2020-10-18 DIAGNOSIS — R251 Tremor, unspecified: Secondary | ICD-10-CM | POA: Diagnosis not present

## 2020-10-18 DIAGNOSIS — I739 Peripheral vascular disease, unspecified: Secondary | ICD-10-CM | POA: Diagnosis not present

## 2020-10-18 DIAGNOSIS — I10 Essential (primary) hypertension: Secondary | ICD-10-CM | POA: Diagnosis not present

## 2020-10-18 DIAGNOSIS — E119 Type 2 diabetes mellitus without complications: Secondary | ICD-10-CM | POA: Diagnosis not present

## 2020-10-30 DIAGNOSIS — I482 Chronic atrial fibrillation, unspecified: Secondary | ICD-10-CM | POA: Diagnosis not present

## 2020-10-31 DIAGNOSIS — F0390 Unspecified dementia without behavioral disturbance: Secondary | ICD-10-CM | POA: Diagnosis not present

## 2020-10-31 DIAGNOSIS — F331 Major depressive disorder, recurrent, moderate: Secondary | ICD-10-CM | POA: Diagnosis not present

## 2020-10-31 DIAGNOSIS — F329 Major depressive disorder, single episode, unspecified: Secondary | ICD-10-CM | POA: Diagnosis not present

## 2020-10-31 DIAGNOSIS — F4321 Adjustment disorder with depressed mood: Secondary | ICD-10-CM | POA: Diagnosis not present

## 2020-10-31 DIAGNOSIS — I10 Essential (primary) hypertension: Secondary | ICD-10-CM | POA: Diagnosis not present

## 2020-10-31 DIAGNOSIS — E785 Hyperlipidemia, unspecified: Secondary | ICD-10-CM | POA: Diagnosis not present

## 2020-10-31 DIAGNOSIS — N400 Enlarged prostate without lower urinary tract symptoms: Secondary | ICD-10-CM | POA: Diagnosis not present

## 2020-10-31 DIAGNOSIS — D649 Anemia, unspecified: Secondary | ICD-10-CM | POA: Diagnosis not present

## 2020-10-31 DIAGNOSIS — F0391 Unspecified dementia with behavioral disturbance: Secondary | ICD-10-CM | POA: Diagnosis not present

## 2020-10-31 DIAGNOSIS — E119 Type 2 diabetes mellitus without complications: Secondary | ICD-10-CM | POA: Diagnosis not present

## 2020-11-01 DIAGNOSIS — I482 Chronic atrial fibrillation, unspecified: Secondary | ICD-10-CM | POA: Diagnosis not present

## 2020-11-01 DIAGNOSIS — I10 Essential (primary) hypertension: Secondary | ICD-10-CM | POA: Diagnosis not present

## 2020-11-01 DIAGNOSIS — R059 Cough, unspecified: Secondary | ICD-10-CM | POA: Diagnosis not present

## 2020-11-01 DIAGNOSIS — F0390 Unspecified dementia without behavioral disturbance: Secondary | ICD-10-CM | POA: Diagnosis not present

## 2020-11-01 DIAGNOSIS — I739 Peripheral vascular disease, unspecified: Secondary | ICD-10-CM | POA: Diagnosis not present

## 2020-11-01 DIAGNOSIS — E119 Type 2 diabetes mellitus without complications: Secondary | ICD-10-CM | POA: Diagnosis not present

## 2020-11-01 DIAGNOSIS — D72829 Elevated white blood cell count, unspecified: Secondary | ICD-10-CM | POA: Diagnosis not present

## 2020-11-02 DIAGNOSIS — M6281 Muscle weakness (generalized): Secondary | ICD-10-CM | POA: Diagnosis not present

## 2020-11-02 DIAGNOSIS — I482 Chronic atrial fibrillation, unspecified: Secondary | ICD-10-CM | POA: Diagnosis not present

## 2020-11-02 DIAGNOSIS — N400 Enlarged prostate without lower urinary tract symptoms: Secondary | ICD-10-CM | POA: Diagnosis not present

## 2020-11-02 DIAGNOSIS — R531 Weakness: Secondary | ICD-10-CM | POA: Diagnosis not present

## 2020-11-02 DIAGNOSIS — F329 Major depressive disorder, single episode, unspecified: Secondary | ICD-10-CM | POA: Diagnosis not present

## 2020-11-02 DIAGNOSIS — K5900 Constipation, unspecified: Secondary | ICD-10-CM | POA: Diagnosis not present

## 2020-11-02 DIAGNOSIS — G9341 Metabolic encephalopathy: Secondary | ICD-10-CM | POA: Diagnosis not present

## 2020-11-02 DIAGNOSIS — E119 Type 2 diabetes mellitus without complications: Secondary | ICD-10-CM | POA: Diagnosis not present

## 2020-11-02 DIAGNOSIS — F419 Anxiety disorder, unspecified: Secondary | ICD-10-CM | POA: Diagnosis not present

## 2020-11-02 DIAGNOSIS — D6852 Prothrombin gene mutation: Secondary | ICD-10-CM | POA: Diagnosis not present

## 2020-11-02 DIAGNOSIS — I10 Essential (primary) hypertension: Secondary | ICD-10-CM | POA: Diagnosis not present

## 2020-11-02 DIAGNOSIS — E785 Hyperlipidemia, unspecified: Secondary | ICD-10-CM | POA: Diagnosis not present

## 2020-11-03 DIAGNOSIS — I739 Peripheral vascular disease, unspecified: Secondary | ICD-10-CM | POA: Diagnosis not present

## 2020-11-03 DIAGNOSIS — E860 Dehydration: Secondary | ICD-10-CM | POA: Diagnosis not present

## 2020-11-03 DIAGNOSIS — D649 Anemia, unspecified: Secondary | ICD-10-CM | POA: Diagnosis not present

## 2020-11-03 DIAGNOSIS — L570 Actinic keratosis: Secondary | ICD-10-CM | POA: Diagnosis not present

## 2020-11-03 DIAGNOSIS — E785 Hyperlipidemia, unspecified: Secondary | ICD-10-CM | POA: Diagnosis not present

## 2020-11-03 DIAGNOSIS — D72829 Elevated white blood cell count, unspecified: Secondary | ICD-10-CM | POA: Diagnosis not present

## 2020-11-03 DIAGNOSIS — F0390 Unspecified dementia without behavioral disturbance: Secondary | ICD-10-CM | POA: Diagnosis not present

## 2020-11-03 DIAGNOSIS — I10 Essential (primary) hypertension: Secondary | ICD-10-CM | POA: Diagnosis not present

## 2020-11-03 DIAGNOSIS — I482 Chronic atrial fibrillation, unspecified: Secondary | ICD-10-CM | POA: Diagnosis not present

## 2020-11-03 DIAGNOSIS — E119 Type 2 diabetes mellitus without complications: Secondary | ICD-10-CM | POA: Diagnosis not present

## 2020-11-04 DIAGNOSIS — R110 Nausea: Secondary | ICD-10-CM | POA: Diagnosis not present

## 2020-11-06 DIAGNOSIS — L570 Actinic keratosis: Secondary | ICD-10-CM | POA: Diagnosis not present

## 2020-11-06 DIAGNOSIS — I10 Essential (primary) hypertension: Secondary | ICD-10-CM | POA: Diagnosis not present

## 2020-11-06 DIAGNOSIS — D649 Anemia, unspecified: Secondary | ICD-10-CM | POA: Diagnosis not present

## 2020-11-06 DIAGNOSIS — R109 Unspecified abdominal pain: Secondary | ICD-10-CM | POA: Diagnosis not present

## 2020-11-06 DIAGNOSIS — R112 Nausea with vomiting, unspecified: Secondary | ICD-10-CM | POA: Diagnosis not present

## 2020-11-06 DIAGNOSIS — E785 Hyperlipidemia, unspecified: Secondary | ICD-10-CM | POA: Diagnosis not present

## 2020-11-06 DIAGNOSIS — E119 Type 2 diabetes mellitus without complications: Secondary | ICD-10-CM | POA: Diagnosis not present

## 2020-11-06 DIAGNOSIS — R1084 Generalized abdominal pain: Secondary | ICD-10-CM | POA: Diagnosis not present

## 2020-11-06 DIAGNOSIS — I739 Peripheral vascular disease, unspecified: Secondary | ICD-10-CM | POA: Diagnosis not present

## 2020-11-08 DIAGNOSIS — I10 Essential (primary) hypertension: Secondary | ICD-10-CM | POA: Diagnosis not present

## 2020-11-08 DIAGNOSIS — K5900 Constipation, unspecified: Secondary | ICD-10-CM | POA: Diagnosis not present

## 2020-11-08 DIAGNOSIS — N400 Enlarged prostate without lower urinary tract symptoms: Secondary | ICD-10-CM | POA: Diagnosis not present

## 2020-11-08 DIAGNOSIS — K567 Ileus, unspecified: Secondary | ICD-10-CM | POA: Diagnosis not present

## 2020-11-08 DIAGNOSIS — L570 Actinic keratosis: Secondary | ICD-10-CM | POA: Diagnosis not present

## 2020-11-08 DIAGNOSIS — R109 Unspecified abdominal pain: Secondary | ICD-10-CM | POA: Diagnosis not present

## 2020-11-08 DIAGNOSIS — E785 Hyperlipidemia, unspecified: Secondary | ICD-10-CM | POA: Diagnosis not present

## 2020-11-08 DIAGNOSIS — I739 Peripheral vascular disease, unspecified: Secondary | ICD-10-CM | POA: Diagnosis not present

## 2020-11-08 DIAGNOSIS — E119 Type 2 diabetes mellitus without complications: Secondary | ICD-10-CM | POA: Diagnosis not present

## 2020-11-08 DIAGNOSIS — F0390 Unspecified dementia without behavioral disturbance: Secondary | ICD-10-CM | POA: Diagnosis not present

## 2020-11-08 DIAGNOSIS — D649 Anemia, unspecified: Secondary | ICD-10-CM | POA: Diagnosis not present

## 2020-11-15 DIAGNOSIS — E785 Hyperlipidemia, unspecified: Secondary | ICD-10-CM | POA: Diagnosis not present

## 2020-11-15 DIAGNOSIS — R600 Localized edema: Secondary | ICD-10-CM | POA: Diagnosis not present

## 2020-11-15 DIAGNOSIS — F0390 Unspecified dementia without behavioral disturbance: Secondary | ICD-10-CM | POA: Diagnosis not present

## 2020-11-15 DIAGNOSIS — E119 Type 2 diabetes mellitus without complications: Secondary | ICD-10-CM | POA: Diagnosis not present

## 2020-11-15 DIAGNOSIS — I739 Peripheral vascular disease, unspecified: Secondary | ICD-10-CM | POA: Diagnosis not present

## 2020-11-15 DIAGNOSIS — I10 Essential (primary) hypertension: Secondary | ICD-10-CM | POA: Diagnosis not present

## 2020-11-15 DIAGNOSIS — D649 Anemia, unspecified: Secondary | ICD-10-CM | POA: Diagnosis not present

## 2020-11-27 DIAGNOSIS — R41841 Cognitive communication deficit: Secondary | ICD-10-CM | POA: Diagnosis not present

## 2020-11-28 DIAGNOSIS — R41841 Cognitive communication deficit: Secondary | ICD-10-CM | POA: Diagnosis not present

## 2020-11-30 DIAGNOSIS — F331 Major depressive disorder, recurrent, moderate: Secondary | ICD-10-CM | POA: Diagnosis not present

## 2020-11-30 DIAGNOSIS — F0390 Unspecified dementia without behavioral disturbance: Secondary | ICD-10-CM | POA: Diagnosis not present

## 2020-11-30 DIAGNOSIS — D649 Anemia, unspecified: Secondary | ICD-10-CM | POA: Diagnosis not present

## 2020-11-30 DIAGNOSIS — E119 Type 2 diabetes mellitus without complications: Secondary | ICD-10-CM | POA: Diagnosis not present

## 2020-11-30 DIAGNOSIS — I10 Essential (primary) hypertension: Secondary | ICD-10-CM | POA: Diagnosis not present

## 2020-11-30 DIAGNOSIS — F0391 Unspecified dementia with behavioral disturbance: Secondary | ICD-10-CM | POA: Diagnosis not present

## 2020-11-30 DIAGNOSIS — F4321 Adjustment disorder with depressed mood: Secondary | ICD-10-CM | POA: Diagnosis not present

## 2020-11-30 DIAGNOSIS — R41841 Cognitive communication deficit: Secondary | ICD-10-CM | POA: Diagnosis not present

## 2020-11-30 DIAGNOSIS — F329 Major depressive disorder, single episode, unspecified: Secondary | ICD-10-CM | POA: Diagnosis not present

## 2020-11-30 DIAGNOSIS — N400 Enlarged prostate without lower urinary tract symptoms: Secondary | ICD-10-CM | POA: Diagnosis not present

## 2020-11-30 DIAGNOSIS — E785 Hyperlipidemia, unspecified: Secondary | ICD-10-CM | POA: Diagnosis not present

## 2020-12-01 DIAGNOSIS — R41841 Cognitive communication deficit: Secondary | ICD-10-CM | POA: Diagnosis not present

## 2020-12-02 DIAGNOSIS — R41841 Cognitive communication deficit: Secondary | ICD-10-CM | POA: Diagnosis not present

## 2020-12-04 DIAGNOSIS — R41841 Cognitive communication deficit: Secondary | ICD-10-CM | POA: Diagnosis not present

## 2020-12-05 DIAGNOSIS — R41841 Cognitive communication deficit: Secondary | ICD-10-CM | POA: Diagnosis not present

## 2020-12-06 DIAGNOSIS — R41841 Cognitive communication deficit: Secondary | ICD-10-CM | POA: Diagnosis not present

## 2020-12-07 DIAGNOSIS — I10 Essential (primary) hypertension: Secondary | ICD-10-CM | POA: Diagnosis not present

## 2020-12-07 DIAGNOSIS — E119 Type 2 diabetes mellitus without complications: Secondary | ICD-10-CM | POA: Diagnosis not present

## 2020-12-07 DIAGNOSIS — I739 Peripheral vascular disease, unspecified: Secondary | ICD-10-CM | POA: Diagnosis not present

## 2020-12-07 DIAGNOSIS — D649 Anemia, unspecified: Secondary | ICD-10-CM | POA: Diagnosis not present

## 2020-12-07 DIAGNOSIS — F419 Anxiety disorder, unspecified: Secondary | ICD-10-CM | POA: Diagnosis not present

## 2020-12-07 DIAGNOSIS — F329 Major depressive disorder, single episode, unspecified: Secondary | ICD-10-CM | POA: Diagnosis not present

## 2020-12-07 DIAGNOSIS — F0390 Unspecified dementia without behavioral disturbance: Secondary | ICD-10-CM | POA: Diagnosis not present

## 2020-12-07 DIAGNOSIS — R41841 Cognitive communication deficit: Secondary | ICD-10-CM | POA: Diagnosis not present

## 2020-12-07 DIAGNOSIS — I482 Chronic atrial fibrillation, unspecified: Secondary | ICD-10-CM | POA: Diagnosis not present

## 2020-12-08 DIAGNOSIS — R41841 Cognitive communication deficit: Secondary | ICD-10-CM | POA: Diagnosis not present

## 2020-12-12 DIAGNOSIS — I482 Chronic atrial fibrillation, unspecified: Secondary | ICD-10-CM | POA: Diagnosis not present

## 2020-12-12 DIAGNOSIS — E1165 Type 2 diabetes mellitus with hyperglycemia: Secondary | ICD-10-CM | POA: Diagnosis not present

## 2020-12-18 DIAGNOSIS — D649 Anemia, unspecified: Secondary | ICD-10-CM | POA: Diagnosis not present

## 2020-12-18 DIAGNOSIS — L570 Actinic keratosis: Secondary | ICD-10-CM | POA: Diagnosis not present

## 2020-12-18 DIAGNOSIS — E119 Type 2 diabetes mellitus without complications: Secondary | ICD-10-CM | POA: Diagnosis not present

## 2020-12-18 DIAGNOSIS — I739 Peripheral vascular disease, unspecified: Secondary | ICD-10-CM | POA: Diagnosis not present

## 2020-12-18 DIAGNOSIS — I482 Chronic atrial fibrillation, unspecified: Secondary | ICD-10-CM | POA: Diagnosis not present

## 2020-12-22 DIAGNOSIS — D529 Folate deficiency anemia, unspecified: Secondary | ICD-10-CM | POA: Diagnosis not present

## 2020-12-22 DIAGNOSIS — D509 Iron deficiency anemia, unspecified: Secondary | ICD-10-CM | POA: Diagnosis not present

## 2021-01-04 DIAGNOSIS — I739 Peripheral vascular disease, unspecified: Secondary | ICD-10-CM | POA: Diagnosis not present

## 2021-01-04 DIAGNOSIS — E785 Hyperlipidemia, unspecified: Secondary | ICD-10-CM | POA: Diagnosis not present

## 2021-01-04 DIAGNOSIS — F0391 Unspecified dementia with behavioral disturbance: Secondary | ICD-10-CM | POA: Diagnosis not present

## 2021-01-04 DIAGNOSIS — I482 Chronic atrial fibrillation, unspecified: Secondary | ICD-10-CM | POA: Diagnosis not present

## 2021-01-04 DIAGNOSIS — I10 Essential (primary) hypertension: Secondary | ICD-10-CM | POA: Diagnosis not present

## 2021-01-04 DIAGNOSIS — F0390 Unspecified dementia without behavioral disturbance: Secondary | ICD-10-CM | POA: Diagnosis not present

## 2021-01-04 DIAGNOSIS — D649 Anemia, unspecified: Secondary | ICD-10-CM | POA: Diagnosis not present

## 2021-01-04 DIAGNOSIS — N400 Enlarged prostate without lower urinary tract symptoms: Secondary | ICD-10-CM | POA: Diagnosis not present

## 2021-01-04 DIAGNOSIS — K5900 Constipation, unspecified: Secondary | ICD-10-CM | POA: Diagnosis not present

## 2021-01-04 DIAGNOSIS — F4321 Adjustment disorder with depressed mood: Secondary | ICD-10-CM | POA: Diagnosis not present

## 2021-01-04 DIAGNOSIS — F331 Major depressive disorder, recurrent, moderate: Secondary | ICD-10-CM | POA: Diagnosis not present

## 2021-01-04 DIAGNOSIS — E119 Type 2 diabetes mellitus without complications: Secondary | ICD-10-CM | POA: Diagnosis not present

## 2021-01-04 DIAGNOSIS — R600 Localized edema: Secondary | ICD-10-CM | POA: Diagnosis not present

## 2021-01-04 DIAGNOSIS — F329 Major depressive disorder, single episode, unspecified: Secondary | ICD-10-CM | POA: Diagnosis not present

## 2021-01-15 DIAGNOSIS — E119 Type 2 diabetes mellitus without complications: Secondary | ICD-10-CM | POA: Diagnosis not present

## 2021-01-15 DIAGNOSIS — D649 Anemia, unspecified: Secondary | ICD-10-CM | POA: Diagnosis not present

## 2021-01-15 DIAGNOSIS — L570 Actinic keratosis: Secondary | ICD-10-CM | POA: Diagnosis not present

## 2021-01-15 DIAGNOSIS — I482 Chronic atrial fibrillation, unspecified: Secondary | ICD-10-CM | POA: Diagnosis not present

## 2021-01-15 DIAGNOSIS — R410 Disorientation, unspecified: Secondary | ICD-10-CM | POA: Diagnosis not present

## 2021-01-15 DIAGNOSIS — I10 Essential (primary) hypertension: Secondary | ICD-10-CM | POA: Diagnosis not present

## 2021-01-15 DIAGNOSIS — R197 Diarrhea, unspecified: Secondary | ICD-10-CM | POA: Diagnosis not present

## 2021-01-15 DIAGNOSIS — I739 Peripheral vascular disease, unspecified: Secondary | ICD-10-CM | POA: Diagnosis not present

## 2021-01-15 DIAGNOSIS — R109 Unspecified abdominal pain: Secondary | ICD-10-CM | POA: Diagnosis not present

## 2021-01-15 DIAGNOSIS — F0390 Unspecified dementia without behavioral disturbance: Secondary | ICD-10-CM | POA: Diagnosis not present

## 2021-01-19 DIAGNOSIS — I10 Essential (primary) hypertension: Secondary | ICD-10-CM | POA: Diagnosis not present

## 2021-01-19 DIAGNOSIS — I482 Chronic atrial fibrillation, unspecified: Secondary | ICD-10-CM | POA: Diagnosis not present

## 2021-01-20 DIAGNOSIS — R140 Abdominal distension (gaseous): Secondary | ICD-10-CM | POA: Diagnosis not present

## 2021-01-20 DIAGNOSIS — N390 Urinary tract infection, site not specified: Secondary | ICD-10-CM | POA: Diagnosis not present

## 2021-01-20 DIAGNOSIS — R109 Unspecified abdominal pain: Secondary | ICD-10-CM | POA: Diagnosis not present

## 2021-01-20 DIAGNOSIS — R112 Nausea with vomiting, unspecified: Secondary | ICD-10-CM | POA: Diagnosis not present

## 2021-01-20 DIAGNOSIS — I10 Essential (primary) hypertension: Secondary | ICD-10-CM | POA: Diagnosis not present

## 2021-01-20 DIAGNOSIS — R110 Nausea: Secondary | ICD-10-CM | POA: Diagnosis not present

## 2021-01-20 DIAGNOSIS — R404 Transient alteration of awareness: Secondary | ICD-10-CM | POA: Diagnosis not present

## 2021-01-20 DIAGNOSIS — I959 Hypotension, unspecified: Secondary | ICD-10-CM | POA: Diagnosis not present

## 2021-01-20 DIAGNOSIS — K567 Ileus, unspecified: Secondary | ICD-10-CM | POA: Diagnosis not present

## 2021-01-20 DIAGNOSIS — R1912 Hyperactive bowel sounds: Secondary | ICD-10-CM | POA: Diagnosis not present

## 2021-01-20 DIAGNOSIS — R1032 Left lower quadrant pain: Secondary | ICD-10-CM | POA: Diagnosis not present

## 2021-01-21 ENCOUNTER — Other Ambulatory Visit: Payer: Self-pay

## 2021-01-21 ENCOUNTER — Encounter (HOSPITAL_COMMUNITY): Payer: Self-pay

## 2021-01-21 ENCOUNTER — Emergency Department (HOSPITAL_COMMUNITY): Payer: Medicare Other

## 2021-01-21 ENCOUNTER — Emergency Department (HOSPITAL_COMMUNITY)
Admission: EM | Admit: 2021-01-21 | Discharge: 2021-01-21 | Disposition: A | Discharge status: Home / Self Care | Payer: Medicare Other | Attending: Emergency Medicine | Admitting: Emergency Medicine

## 2021-01-21 DIAGNOSIS — Z79899 Other long term (current) drug therapy: Secondary | ICD-10-CM | POA: Insufficient documentation

## 2021-01-21 DIAGNOSIS — E875 Hyperkalemia: Secondary | ICD-10-CM | POA: Diagnosis not present

## 2021-01-21 DIAGNOSIS — E1169 Type 2 diabetes mellitus with other specified complication: Secondary | ICD-10-CM | POA: Insufficient documentation

## 2021-01-21 DIAGNOSIS — I10 Essential (primary) hypertension: Secondary | ICD-10-CM | POA: Diagnosis not present

## 2021-01-21 DIAGNOSIS — E860 Dehydration: Secondary | ICD-10-CM | POA: Diagnosis not present

## 2021-01-21 DIAGNOSIS — R279 Unspecified lack of coordination: Secondary | ICD-10-CM | POA: Diagnosis not present

## 2021-01-21 DIAGNOSIS — R1032 Left lower quadrant pain: Secondary | ICD-10-CM | POA: Diagnosis not present

## 2021-01-21 DIAGNOSIS — E785 Hyperlipidemia, unspecified: Secondary | ICD-10-CM | POA: Insufficient documentation

## 2021-01-21 DIAGNOSIS — R1084 Generalized abdominal pain: Secondary | ICD-10-CM | POA: Insufficient documentation

## 2021-01-21 DIAGNOSIS — Z794 Long term (current) use of insulin: Secondary | ICD-10-CM | POA: Diagnosis not present

## 2021-01-21 DIAGNOSIS — F0390 Unspecified dementia without behavioral disturbance: Secondary | ICD-10-CM | POA: Insufficient documentation

## 2021-01-21 DIAGNOSIS — Z7901 Long term (current) use of anticoagulants: Secondary | ICD-10-CM | POA: Insufficient documentation

## 2021-01-21 DIAGNOSIS — Z87891 Personal history of nicotine dependence: Secondary | ICD-10-CM | POA: Insufficient documentation

## 2021-01-21 DIAGNOSIS — Z743 Need for continuous supervision: Secondary | ICD-10-CM | POA: Diagnosis not present

## 2021-01-21 DIAGNOSIS — R109 Unspecified abdominal pain: Secondary | ICD-10-CM | POA: Diagnosis not present

## 2021-01-21 DIAGNOSIS — R140 Abdominal distension (gaseous): Secondary | ICD-10-CM | POA: Diagnosis not present

## 2021-01-21 LAB — COMPREHENSIVE METABOLIC PANEL
ALT: 14 U/L (ref 0–44)
AST: 15 U/L (ref 15–41)
Albumin: 3.7 g/dL (ref 3.5–5.0)
Alkaline Phosphatase: 86 U/L (ref 38–126)
Anion gap: 10 (ref 5–15)
BUN: 34 mg/dL — ABNORMAL HIGH (ref 8–23)
CO2: 23 mmol/L (ref 22–32)
Calcium: 9.2 mg/dL (ref 8.9–10.3)
Chloride: 100 mmol/L (ref 98–111)
Creatinine, Ser: 1.25 mg/dL — ABNORMAL HIGH (ref 0.61–1.24)
GFR, Estimated: 59 mL/min — ABNORMAL LOW (ref >60)
Glucose, Bld: 144 mg/dL — ABNORMAL HIGH (ref 70–99)
Potassium: 5.5 mmol/L — ABNORMAL HIGH (ref 3.5–5.1)
Sodium: 133 mmol/L — ABNORMAL LOW (ref 135–145)
Total Bilirubin: 0.8 mg/dL (ref 0.3–1.2)
Total Protein: 6.9 g/dL (ref 6.5–8.1)

## 2021-01-21 LAB — CBC WITH DIFFERENTIAL/PLATELET
Abs Immature Granulocytes: 0.03 10*3/uL (ref 0.00–0.07)
Basophils Absolute: 0.1 10*3/uL (ref 0.0–0.1)
Basophils Relative: 1 %
Eosinophils Absolute: 0.2 10*3/uL (ref 0.0–0.5)
Eosinophils Relative: 2 %
HCT: 37.7 % — ABNORMAL LOW (ref 39.0–52.0)
Hemoglobin: 12.4 g/dL — ABNORMAL LOW (ref 13.0–17.0)
Immature Granulocytes: 0 %
Lymphocytes Relative: 14 %
Lymphs Abs: 1.3 10*3/uL (ref 0.7–4.0)
MCH: 30.9 pg (ref 26.0–34.0)
MCHC: 32.9 g/dL (ref 30.0–36.0)
MCV: 94 fL (ref 80.0–100.0)
Monocytes Absolute: 0.8 10*3/uL (ref 0.1–1.0)
Monocytes Relative: 9 %
Neutro Abs: 6.5 10*3/uL (ref 1.7–7.7)
Neutrophils Relative %: 74 %
Platelets: 265 10*3/uL (ref 150–400)
RBC: 4.01 MIL/uL — ABNORMAL LOW (ref 4.22–5.81)
RDW: 12 % (ref 11.5–15.5)
WBC: 8.9 10*3/uL (ref 4.0–10.5)
nRBC: 0 % (ref 0.0–0.2)

## 2021-01-21 LAB — I-STAT CHEM 8, ED
BUN: 32 mg/dL — ABNORMAL HIGH (ref 8–23)
Calcium, Ion: 1.19 mmol/L (ref 1.15–1.40)
Chloride: 100 mmol/L (ref 98–111)
Creatinine, Ser: 1.3 mg/dL — ABNORMAL HIGH (ref 0.61–1.24)
Glucose, Bld: 141 mg/dL — ABNORMAL HIGH (ref 70–99)
HCT: 37 % — ABNORMAL LOW (ref 39.0–52.0)
Hemoglobin: 12.6 g/dL — ABNORMAL LOW (ref 13.0–17.0)
Potassium: 5.4 mmol/L — ABNORMAL HIGH (ref 3.5–5.1)
Sodium: 133 mmol/L — ABNORMAL LOW (ref 135–145)
TCO2: 24 mmol/L (ref 22–32)

## 2021-01-21 LAB — LACTIC ACID, PLASMA: Lactic Acid, Venous: 1.1 mmol/L (ref 0.5–1.9)

## 2021-01-21 LAB — LIPASE, BLOOD: Lipase: 32 U/L (ref 11–51)

## 2021-01-21 MED ORDER — IOHEXOL 300 MG/ML  SOLN
100.0000 mL | Freq: Once | INTRAMUSCULAR | Status: AC | PRN
  Administered 2021-01-21: 100 mL via INTRAVENOUS

## 2021-01-21 MED ORDER — SODIUM CHLORIDE 0.9 % IV BOLUS
1000.0000 mL | Freq: Once | INTRAVENOUS | Status: AC
  Administered 2021-01-21: 1000 mL via INTRAVENOUS

## 2021-01-21 NOTE — ED Triage Notes (Signed)
Patient BIB EMS from SNF ([place]) c/o Abdominal Pain. Pt report abdominal started 2-3 days ago. Denies N/V/D. Pt A/Ox4.

## 2021-01-21 NOTE — ED Provider Notes (Signed)
[place] DEPT Provider Note  CSN: [id number] Arrival date & time: 01/21/21 0002  Chief Complaint(s) Abdominal Pain ED Triage Notes [place], Junior Jones Bales, RN (Registered Nurse) . [name] Date of Service: 01/21/2021 12:08 AM . . Signed   Patient BIB EMS from SNF ([facility]) c/o Abdominal Pain. Pt report abdominal started 2-3 days ago. Denies N/V/D.       HPI Christian [name] is a 80 y.o. male with a history of dementia coming from facility for reported abdominal discomfort for 2 to 3 days.  Due to patient's dementia, he is a poor historian.  Currently has no physical complaints.  Remainder of history, ROS, and physical exam limited due to patient's condition (dementia).   Level V Caveat.    HPI  Past Medical History Past Medical History:  Diagnosis Date  . Agitation   . Arthritis   . Cataract   . Chronic back pain   . Confusion   . Diabetes ([place])    type 2  . DVT (deep venous thrombosis) ([place]) 01/2015  . Hearing impaired   . High cholesterol   . Hypertension   . Insomnia   . Insomnia   . Leg pain, diffuse   . Leg swelling   . Memory loss   . Mild cognitive impairment    sees [facility]  . Numbness    fingers, feet, toes  . OSA on CPAP   . 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 06/14/2015  . Pulmonary embolism ([place]) 01/2015  . Spinal stenosis    lumbar  . Tremor    on propranolol  . Wears glasses    Patient Active Problem List   Diagnosis Date Noted  . Falls frequently 04/13/2020  . Hyperglycemia due to diabetes mellitus ([place]) 04/13/2020  . Acute metabolic encephalopathy 37/34/2876  . Ambulatory dysfunction 04/12/2020  . Hyperlipidemia associated with type 2 diabetes mellitus ([place]) 03/14/2020  . Acute blood loss anemia 03/14/2020  . Chronic constipation 03/14/2020  . Depression due to dementia ([place]) 03/10/2020  . Closed displaced intertrochanteric fracture of left  femur ([place])   . S/p left hip fracture 03/03/2020  . Benign prostatic hyperplasia with lower urinary tract symptoms 06/04/2019  . Urge incontinence 06/04/2019  . History of stroke 06/02/2019  . Medicare annual wellness visit, subsequent 06/02/2019  . Urinary incontinence 10/28/2018  . Polyuria 10/28/2018  . Dermatitis 10/28/2018  . Tinea cruris 03/25/2018  . Tick bite 03/25/2018  . Balanitis 03/25/2018  . At moderate risk for fall 12/31/2017  . Alcohol abuse 12/29/2017  . History of pulmonary embolism 12/29/2017  . Depression, recurrent ([place]) 12/29/2017  . Alcohol intoxication ([place]) 12/16/2017  . Wears hearing aid 06/09/2017  . Tinnitus of both ears 06/09/2017  . Bilateral impacted cerumen 06/09/2017  . Left wrist pain 06/26/2016  . Morning joint stiffness 06/26/2016  . Polyarthralgia 06/26/2016  . Insulin dependent type 2 diabetes mellitus, uncontrolled ([place]) 04/02/2016  . Gait disturbance 04/02/2016  . Physical deconditioning 04/02/2016  . Pain of both shoulder joints 04/02/2016  . Leg weakness, bilateral 04/02/2016  . Encounter for therapeutic drug monitoring 11/29/2015  . Encounter for health maintenance examination in adult 10/30/2015  . Shoulder pain, bilateral 10/30/2015  . Muscle weakness 10/30/2015  . Generalized weakness 10/30/2015  . Rash 10/30/2015  . PVC (premature ventricular contraction) 10/10/2015  . Decreased energy 06/20/2015  . Chronic pulmonary embolism ([place]) 06/20/2015  . DVT (deep venous thrombosis) ([place]) 06/20/2015  .  Hammer toe of left foot 06/20/2015  . Pre-ulcerative calluses 06/20/2015  . Hypertrophic toenail 06/20/2015  . Diabetic mononeuropathy associated with diabetes mellitus due to underlying condition ([place]) 06/20/2015  . Leg pain, bilateral 06/20/2015  . Chronic low back pain 06/20/2015  . 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 06/14/2015  . OSA on CPAP 03/06/2015  . Edema 03/06/2015  . Leg  pain, diffuse 03/06/2015  . Bilateral low back pain without sciatica 03/06/2015  . Tremor 03/06/2015  . Insomnia 03/06/2015  . Vaccine counseling 03/06/2015  . Chronic back pain 03/06/2015  . Wears glasses 03/06/2015  . Hearing loss 03/06/2015  . Spinal stenosis of lumbar region 03/06/2015  . Ptosis 03/06/2015  . Positive depression screening 03/06/2015  . Advance directive discussed with patient 03/06/2015  . Bradycardia 02/27/2015  . Current use of long term anticoagulation   . Dementia without behavioral disturbance ([place])   . Hypertension associated with type 2 diabetes mellitus ([place]) 02/08/2015  . Hyperlipidemia 02/08/2015  . OSA (obstructive sleep apnea) 02/08/2015  . Memory loss 03/12/2013  . Diabetes mellitus ([place]) 03/12/2013   Home Medication(s) Prior to Admission medications   Medication Sig Start Date End Date Taking? Authorizing Provider  acetaminophen (TYLENOL) 325 MG tablet Take 650 mg by mouth every 6 (six) hours as needed for mild pain or moderate pain.   Yes [provider]  buPROPion (WELLBUTRIN XL) 150 MG 24 hr tablet Take 150 mg by mouth daily.   Yes [provider]  busPIRone (BUSPAR) 5 MG tablet Take 5 mg by mouth 2 (two) times daily.   Yes [provider]  chlorhexidine (PERIDEX) 0.12 % solution Use as directed 15 mLs in the mouth or throat at bedtime. 08/02/20  Yes [provider]  diltiazem (CARDIZEM) 30 MG tablet TAKE 1 TABLET(30 MG) BY MOUTH TWICE DAILY Patient taking differently: Take 30 mg by mouth 2 (two) times daily. 04/06/20  Yes Gerlene Fee, NP  docusate sodium (COLACE) 100 MG capsule Take 100 mg by mouth daily.   Yes [provider]  donepezil (ARICEPT) 10 MG tablet Take 1 tablet (10 mg) by mouth at bedtime. 04/06/20  Yes Gerlene Fee, NP  ferrous sulfate 325 (65 FE) MG tablet Take 325 mg by mouth daily with breakfast.   Yes [provider]  flecainide (TAMBOCOR) 150 MG tablet TAKE 1/2  TABLET(75 MG) BY MOUTH TWICE DAILY Patient taking differently: Take 75 mg by mouth 2 (two) times daily. 04/06/20  Yes Gerlene Fee, NP  folic acid (FOLVITE) 1 MG tablet Take 1 mg daily Patient taking differently: Take 1 mg by mouth in the morning. 04/06/20  Yes Gerlene Fee, NP  furosemide (LASIX) 40 MG tablet Take 40 mg daily AS NEEDED  For swelling. Patient taking differently: Take 40 mg by mouth daily. 04/06/20  Yes Gerlene Fee, NP  insulin lispro (HUMALOG) 100 UNIT/ML KwikPen Inject 2-10 Units into the skin See admin instructions. 71-150= 0 units, 151-200=2 units, 201-250=4 units, 251-300= 6 units, 301-350= 8 units, 351-400=10 units, >400 call md     Sliding scale   Yes [provider]  lisinopril (ZESTRIL) 10 MG tablet Take 10 mg by mouth daily. 07/23/20  Yes [provider]  memantine (NAMENDA XR) 28 MG CP24 24 hr capsule TAKE 1 CAPSULE(28 MG) BY MOUTH DAILY Patient taking differently: Take 28 mg by mouth in the morning. 04/06/20  Yes Gerlene Fee, NP  Misc Natural Products (  OSTEO BI-FLEX ADV JOINT SHIELD PO) Take 2 tablets by mouth daily.    Yes [provider]  polyethylene glycol (MIRALAX / GLYCOLAX) 17 g packet Take 17 g by mouth 2 (two) times daily. 03/10/20  Yes [provider]  rivaroxaban (XARELTO) 20 MG TABS tablet TAKE 1 TABLET BY MOUTH EVERY DAY WITH SUPPER Patient taking differently: Take 20 mg by mouth daily with supper. 04/06/20  Yes Gerlene Fee, NP  rosuvastatin (CRESTOR) 10 MG tablet TAKE 1 TABLET(10 MG) BY MOUTH AT BEDTIME Patient taking differently: Take 10 mg by mouth at bedtime. 04/06/20  Yes Gerlene Fee, NP  tamsulosin (FLOMAX) 0.4 MG CAPS capsule TAKE 1 CAPSULE(0.4 MG) BY MOUTH DAILY Patient taking differently: Take 0.4 mg by mouth in the morning. 04/06/20  Yes Gerlene Fee, NP  tolterodine (DETROL LA) 4 MG 24 hr capsule Take 1 capsule (4 mg total) by mouth at bedtime. 04/06/20  Yes Gerlene Fee, NP  TRULICITY  0.93 AT/5.5DD SOPN Inject 1.5 mg into the skin every Wednesday. 07/18/20  Yes [provider]  vitamin B-12 (CYANOCOBALAMIN) 500 MCG tablet Take 1 tablet (500 mcg total) by mouth daily. 04/15/20  Yes TatShanon Brow, MD                                                                                                                                    Past Surgical History Past Surgical History:  Procedure Laterality Date  . CATARACT EXTRACTION    . COLONOSCOPY  2014  . INTRAMEDULLARY (IM) NAIL INTERTROCHANTERIC Left 03/05/2020   Procedure: INTRAMEDULLARY (IM) NAIL INTERTROCHANTRIC;  Surgeon: Carole Civil, MD;  Location: [facility];  Service: Orthopedics;  Laterality: Left;  . TONSILLECTOMY     Family History Family History  Problem Relation Age of Onset  . Dementia Father   . Clotting disorder Mother   . Heart disease Brother   . Clotting disorder Brother   . Psychiatric Illness Sister   . Leukemia Sister     Social History Social History   Tobacco Use  . Smoking status: Former Smoker    Packs/day: 1.00    Years: 10.00    Pack years: 10.00    Types: Cigarettes    Quit date: 11/25/2004    Years since quitting: 16.1  . Smokeless tobacco: Never Used  Vaping Use  . Vaping Use: Never used  Substance Use Topics  . Alcohol use: Yes    Alcohol/week: 1.0 standard drink    Types: 1 Glasses of wine per week    Comment: once a month per pt   . Drug use: Not Currently   Allergies Bee venom, Ultram [tramadol hcl], Oxycodone, Oxycontin [oxycodone hcl], and Zoloft [sertraline hcl]  Review of Systems Review of Systems  Unable to perform ROS: Dementia    Physical Exam Vital Signs  I have reviewed the triage vital signs BP (!) 144/112   Pulse  72   Temp 97.8 F (36.6 C)   Resp 13   Ht 5\' 11"  (1.803 m)   Wt 108.9 kg   SpO2 99%   BMI 33.47 kg/m   Physical Exam Vitals reviewed.  Constitutional:      General: He is not in acute distress.    Appearance: He is  well-developed and well-nourished. He is not diaphoretic.  HENT:     Head: Normocephalic and atraumatic.     Nose: Nose normal.  Eyes:     General: No scleral icterus.       Right eye: No discharge.        Left eye: No discharge.     Extraocular Movements: EOM normal.     Conjunctiva/sclera: Conjunctivae normal.     Pupils: Pupils are equal, round, and reactive to light.  Cardiovascular:     Rate and Rhythm: Normal rate and regular rhythm.     Heart sounds: No murmur heard. No friction rub. No gallop.   Pulmonary:     Effort: Pulmonary effort is normal. No respiratory distress.     Breath sounds: Normal breath sounds. No stridor. No rales.  Abdominal:     General: There is distension.     Palpations: Abdomen is soft.     Tenderness: There is no abdominal tenderness. There is no guarding or rebound.  Musculoskeletal:        General: No tenderness or edema.     Cervical back: Normal range of motion and neck supple.  Skin:    General: Skin is warm and dry.     Findings: No erythema or rash.  Neurological:     Mental Status: He is alert. He is disoriented.     Comments: Pleasantly demented  Psychiatric:        Mood and Affect: Mood and affect normal.     ED Results and Treatments Labs (all labs ordered are listed, but only abnormal results are displayed) Labs Reviewed  CBC WITH DIFFERENTIAL/PLATELET - Abnormal; Notable for the following components:      Result Value   RBC 4.01 (*)    Hemoglobin 12.4 (*)    HCT 37.7 (*)    All other components within normal limits  COMPREHENSIVE METABOLIC PANEL - Abnormal; Notable for the following components:   Sodium 133 (*)    Potassium 5.5 (*)    Glucose, Bld 144 (*)    BUN 34 (*)    Creatinine, Ser 1.25 (*)    GFR, Estimated 59 (*)    All other components within normal limits  I-STAT CHEM 8, ED - Abnormal; Notable for the following components:   Sodium 133 (*)    Potassium 5.4 (*)    BUN 32 (*)    Creatinine, Ser 1.30 (*)     Glucose, Bld 141 (*)    Hemoglobin 12.6 (*)    HCT 37.0 (*)    All other components within normal limits  LIPASE, BLOOD  LACTIC ACID, PLASMA  EKG  EKG Interpretation  Date/Time:    Ventricular Rate:    PR Interval:    QRS Duration:   QT Interval:    QTC Calculation:   R Axis:     Text Interpretation:        Radiology CT ABDOMEN PELVIS W CONTRAST  Result Date: 01/21/2021 CLINICAL DATA:  Unspecified abdominal pain EXAM: CT ABDOMEN AND PELVIS WITH CONTRAST TECHNIQUE: Multidetector CT imaging of the abdomen and pelvis was performed using the standard protocol following bolus administration of intravenous contrast. CONTRAST:  141mL OMNIPAQUE IOHEXOL 300 MG/ML  SOLN COMPARISON:  03/11/2020 FINDINGS: Lower chest: Mild bibasilar atelectasis. Visualized heart and pericardium are unremarkable. Tiny hiatal hernia. Hepatobiliary: Simple cyst again noted within the right hepatic lobe. Liver otherwise unremarkable. Gallbladder unremarkable. No intra or extrahepatic biliary ductal dilation. Pancreas: Unremarkable Spleen: Unremarkable Adrenals/Urinary Tract: Adrenal glands are unremarkable. Kidneys are normal, without renal calculi, focal lesion, or hydronephrosis. Bladder is unremarkable. Stomach/Bowel: Stomach is within normal limits. Appendix appears normal. No evidence of bowel wall thickening, distention, or inflammatory changes. No free intraperitoneal gas or fluid. Vascular/Lymphatic: Mild aortoiliac atherosclerotic calcification. No aortic aneurysm. No pathologic adenopathy within the abdomen and pelvis. Reproductive: Moderate prostatic enlargement. Seminal vesicles are unremarkable. Other: Tiny umbilical hernia. Tiny bilateral inguinal hernias. Rectum unremarkable. Musculoskeletal: Left hip ORIF has been performed. Moderate surrounding heterotopic ossification. Bilateral hip  degenerative arthritis is present. Degenerative changes are seen within the lumbar spine. No acute bone abnormality. No lytic or blastic bone lesions. IMPRESSION: No acute intra-abdominal pathology. No definite radiographic explanation for the patient's reported symptoms. Aortic Atherosclerosis (ICD10-I70.0). Electronically Signed   By: Fidela Salisbury MD   On: 01/21/2021 02:36    Pertinent labs & imaging results that were available during my care of the patient were reviewed by me and considered in my medical decision making (see chart for details).  Medications Ordered in ED Medications  sodium chloride 0.9 % bolus 1,000 mL (0 mLs Intravenous Stopped 01/21/21 0204)  iohexol (OMNIPAQUE) 300 MG/ML solution 100 mL (100 mLs Intravenous Contrast Given 01/21/21 0212)                                                                                                                                    Procedures Procedures  (including critical care time)  Medical Decision Making / ED Course I have reviewed the nursing notes for this encounter and the patient's prior records (if available in EHR or on provided paperwork).   Christian [name] was evaluated in Emergency Department on 01/21/2021 for the symptoms described in the history of present illness. He was evaluated in the context of the global COVID-19 pandemic, which necessitated consideration that the patient might be at risk for infection with the SARS-CoV-2 virus that causes COVID-19. Institutional protocols and algorithms that pertain to the evaluation of patients at risk for COVID-19 are in a state of rapid change based on information  released by regulatory bodies including the CDC and federal and state organizations. These policies and algorithms were followed during the patient's care in the ED.  Patient's work-up was grossly reassuring without evidence of significant intra-abdominal inflammatory/infectious process or bowel obstruction.. Labs  without leukocytosis or anemia.. Patient with mild hyperkalemia and mild AKI likely related to dehydration.. Provided with 1 L of IV fluids. Patient given oral hydration and food. Tolerating p.o..       Final Clinical Impression(s) / ED Diagnoses Final diagnoses:  Abdominal discomfort  Dehydration  Hyperkalemia   The patient appears reasonably screened and/or stabilized for discharge and I doubt any other medical condition or other [facility] requiring further screening, evaluation, or treatment in the ED at this time prior to discharge. Safe for discharge with strict return precautions.  Disposition: Discharge  Condition: Good   ED Discharge Orders    None      Follow Up: Carlena Hurl, PA-C [place] [id number] [phone]  Call  to schedule an appointment for close follow up      This chart was dictated using voice recognition software.  Despite best efforts to proofread,  errors can occur which can change the documentation meaning.   Fatima Blank, MD 01/21/21 [phone]

## 2021-01-21 NOTE — Discharge Instructions (Addendum)
Patient's work-up was reassuring without evidence of bowel obstruction or serious intra-abdominal inflammatory/infectious process. His blood work did reveal that he was slightly dehydrated and his potassium was slightly elevated.  He was given IV fluids. Recommend encouraging oral hydration and checking repeat labs in 2 to 3 days to assess his renal function and potassium level.  Abdominal (belly) pain can be caused by many things. Your caregiver performed an examination and possibly ordered blood/urine tests and imaging (CT scan, x-rays, ultrasound). Many cases can be observed and treated at home after initial evaluation in the emergency department. Even though you are being discharged home, abdominal pain can be unpredictable. Therefore, you need a repeated exam if your pain does not resolve, returns, or worsens. Most patients with abdominal pain don't have to be admitted to the hospital or have surgery, but serious problems like appendicitis and gallbladder attacks can start out as nonspecific pain. Many abdominal conditions cannot be diagnosed in one visit, so follow-up evaluations are very important. SEEK IMMEDIATE MEDICAL ATTENTION IF: The pain does not go away or becomes severe.  A temperature above 101 develops.  Repeated vomiting occurs (multiple episodes).  The pain becomes localized to portions of the abdomen. The right side could possibly be appendicitis. In an adult, the left lower portion of the abdomen could be colitis or diverticulitis.  Blood is being passed in stools or vomit (bright red or black tarry stools).  Return also if you develop chest pain, difficulty breathing, dizziness or fainting, or become confused, poorly responsive, or inconsolable (young children).

## 2021-01-22 DIAGNOSIS — E875 Hyperkalemia: Secondary | ICD-10-CM | POA: Diagnosis not present

## 2021-01-22 DIAGNOSIS — I482 Chronic atrial fibrillation, unspecified: Secondary | ICD-10-CM | POA: Diagnosis not present

## 2021-01-22 DIAGNOSIS — F0390 Unspecified dementia without behavioral disturbance: Secondary | ICD-10-CM | POA: Diagnosis not present

## 2021-01-22 DIAGNOSIS — R1111 Vomiting without nausea: Secondary | ICD-10-CM | POA: Diagnosis not present

## 2021-01-22 DIAGNOSIS — R112 Nausea with vomiting, unspecified: Secondary | ICD-10-CM | POA: Diagnosis not present

## 2021-01-22 DIAGNOSIS — D6852 Prothrombin gene mutation: Secondary | ICD-10-CM | POA: Diagnosis not present

## 2021-01-22 DIAGNOSIS — L570 Actinic keratosis: Secondary | ICD-10-CM | POA: Diagnosis not present

## 2021-01-22 DIAGNOSIS — E860 Dehydration: Secondary | ICD-10-CM | POA: Diagnosis not present

## 2021-01-22 DIAGNOSIS — I739 Peripheral vascular disease, unspecified: Secondary | ICD-10-CM | POA: Diagnosis not present

## 2021-01-22 DIAGNOSIS — E119 Type 2 diabetes mellitus without complications: Secondary | ICD-10-CM | POA: Diagnosis not present

## 2021-01-22 DIAGNOSIS — D649 Anemia, unspecified: Secondary | ICD-10-CM | POA: Diagnosis not present

## 2021-01-22 DIAGNOSIS — F329 Major depressive disorder, single episode, unspecified: Secondary | ICD-10-CM | POA: Diagnosis not present

## 2021-01-22 DIAGNOSIS — I10 Essential (primary) hypertension: Secondary | ICD-10-CM | POA: Diagnosis not present

## 2021-01-23 DIAGNOSIS — F0390 Unspecified dementia without behavioral disturbance: Secondary | ICD-10-CM | POA: Diagnosis not present

## 2021-01-23 DIAGNOSIS — E785 Hyperlipidemia, unspecified: Secondary | ICD-10-CM | POA: Diagnosis not present

## 2021-01-23 DIAGNOSIS — F329 Major depressive disorder, single episode, unspecified: Secondary | ICD-10-CM | POA: Diagnosis not present

## 2021-01-23 DIAGNOSIS — F0391 Unspecified dementia with behavioral disturbance: Secondary | ICD-10-CM | POA: Diagnosis not present

## 2021-01-23 DIAGNOSIS — F4321 Adjustment disorder with depressed mood: Secondary | ICD-10-CM | POA: Diagnosis not present

## 2021-01-23 DIAGNOSIS — I482 Chronic atrial fibrillation, unspecified: Secondary | ICD-10-CM | POA: Diagnosis not present

## 2021-01-23 DIAGNOSIS — D649 Anemia, unspecified: Secondary | ICD-10-CM | POA: Diagnosis not present

## 2021-01-23 DIAGNOSIS — I10 Essential (primary) hypertension: Secondary | ICD-10-CM | POA: Diagnosis not present

## 2021-01-23 DIAGNOSIS — N400 Enlarged prostate without lower urinary tract symptoms: Secondary | ICD-10-CM | POA: Diagnosis not present

## 2021-01-23 DIAGNOSIS — F331 Major depressive disorder, recurrent, moderate: Secondary | ICD-10-CM | POA: Diagnosis not present

## 2021-01-23 DIAGNOSIS — E119 Type 2 diabetes mellitus without complications: Secondary | ICD-10-CM | POA: Diagnosis not present

## 2021-01-30 DIAGNOSIS — E1169 Type 2 diabetes mellitus with other specified complication: Secondary | ICD-10-CM | POA: Diagnosis not present

## 2021-02-01 DIAGNOSIS — R100 Acute abdomen: Secondary | ICD-10-CM | POA: Diagnosis not present

## 2021-02-01 DIAGNOSIS — E785 Hyperlipidemia, unspecified: Secondary | ICD-10-CM | POA: Diagnosis not present

## 2021-02-01 DIAGNOSIS — E875 Hyperkalemia: Secondary | ICD-10-CM | POA: Diagnosis not present

## 2021-02-01 DIAGNOSIS — R600 Localized edema: Secondary | ICD-10-CM | POA: Diagnosis not present

## 2021-02-01 DIAGNOSIS — K5900 Constipation, unspecified: Secondary | ICD-10-CM | POA: Diagnosis not present

## 2021-02-01 DIAGNOSIS — N400 Enlarged prostate without lower urinary tract symptoms: Secondary | ICD-10-CM | POA: Diagnosis not present

## 2021-02-01 DIAGNOSIS — R112 Nausea with vomiting, unspecified: Secondary | ICD-10-CM | POA: Diagnosis not present

## 2021-02-01 DIAGNOSIS — D649 Anemia, unspecified: Secondary | ICD-10-CM | POA: Diagnosis not present

## 2021-02-01 DIAGNOSIS — E860 Dehydration: Secondary | ICD-10-CM | POA: Diagnosis not present

## 2021-02-05 DIAGNOSIS — G8929 Other chronic pain: Secondary | ICD-10-CM | POA: Diagnosis not present

## 2021-02-05 DIAGNOSIS — E119 Type 2 diabetes mellitus without complications: Secondary | ICD-10-CM | POA: Diagnosis not present

## 2021-02-05 DIAGNOSIS — N400 Enlarged prostate without lower urinary tract symptoms: Secondary | ICD-10-CM | POA: Diagnosis not present

## 2021-02-05 DIAGNOSIS — D649 Anemia, unspecified: Secondary | ICD-10-CM | POA: Diagnosis not present

## 2021-02-05 DIAGNOSIS — I739 Peripheral vascular disease, unspecified: Secondary | ICD-10-CM | POA: Diagnosis not present

## 2021-02-05 DIAGNOSIS — I10 Essential (primary) hypertension: Secondary | ICD-10-CM | POA: Diagnosis not present

## 2021-02-05 DIAGNOSIS — F329 Major depressive disorder, single episode, unspecified: Secondary | ICD-10-CM | POA: Diagnosis not present

## 2021-02-05 DIAGNOSIS — L570 Actinic keratosis: Secondary | ICD-10-CM | POA: Diagnosis not present

## 2021-02-05 DIAGNOSIS — F419 Anxiety disorder, unspecified: Secondary | ICD-10-CM | POA: Diagnosis not present

## 2021-02-12 ENCOUNTER — Non-Acute Institutional Stay: Payer: Medicare Other | Admitting: Nurse Practitioner

## 2021-02-12 ENCOUNTER — Other Ambulatory Visit: Payer: Self-pay

## 2021-02-12 DIAGNOSIS — Z515 Encounter for palliative care: Secondary | ICD-10-CM | POA: Diagnosis not present

## 2021-02-12 DIAGNOSIS — N400 Enlarged prostate without lower urinary tract symptoms: Secondary | ICD-10-CM | POA: Diagnosis not present

## 2021-02-12 DIAGNOSIS — F329 Major depressive disorder, single episode, unspecified: Secondary | ICD-10-CM | POA: Diagnosis not present

## 2021-02-12 DIAGNOSIS — R142 Eructation: Secondary | ICD-10-CM | POA: Diagnosis not present

## 2021-02-12 DIAGNOSIS — E119 Type 2 diabetes mellitus without complications: Secondary | ICD-10-CM | POA: Diagnosis not present

## 2021-02-12 DIAGNOSIS — K5900 Constipation, unspecified: Secondary | ICD-10-CM | POA: Diagnosis not present

## 2021-02-12 DIAGNOSIS — I10 Essential (primary) hypertension: Secondary | ICD-10-CM | POA: Diagnosis not present

## 2021-02-12 DIAGNOSIS — G8929 Other chronic pain: Secondary | ICD-10-CM | POA: Diagnosis not present

## 2021-02-12 DIAGNOSIS — R143 Flatulence: Secondary | ICD-10-CM | POA: Diagnosis not present

## 2021-02-12 DIAGNOSIS — E785 Hyperlipidemia, unspecified: Secondary | ICD-10-CM | POA: Diagnosis not present

## 2021-02-12 DIAGNOSIS — R141 Gas pain: Secondary | ICD-10-CM

## 2021-02-12 DIAGNOSIS — F0390 Unspecified dementia without behavioral disturbance: Secondary | ICD-10-CM | POA: Diagnosis not present

## 2021-02-12 DIAGNOSIS — D649 Anemia, unspecified: Secondary | ICD-10-CM | POA: Diagnosis not present

## 2021-02-12 DIAGNOSIS — I482 Chronic atrial fibrillation, unspecified: Secondary | ICD-10-CM | POA: Diagnosis not present

## 2021-02-12 NOTE — Progress Notes (Addendum)
[facility] Consult Note Telephone: [phone]  Fax: [phone]  PATIENT NAME: Christian [name] [address] [place] [address] [phone] (home)  DOB: 27-Jan-1941 MRN: [id number]  PRIMARY CARE PROVIDER:    Caryl [name],  [place] [address] [phone]  REFERRING PROVIDER:   Caryl [name] [address] [place],  [place] [address] [phone]  RESPONSIBLE PARTY:   Extended Emergency Contact Information Primary Emergency Contact: [facility] Address: [place]          [place],  [address] [place] Phone: [phone] Mobile Phone: [phone] Relation: Spouse  I met face to face with patient in facility.   ASSESSMENT AND RECOMMENDATIONS:   Advance Care Planning: Goal of care: Patients goal of care is comfort while preserving function.  Directives: Signed DNR and MOST form on file in the facility, copies on [facility] EMR. Patient reiterated desire to not be resuscitated in the event of cardiac or respiratory arrest. Detail of MOST form include, full scope of treatment, antibiotics if indicated,  IV fluids for a defined trial period  Symptom Management:  Flatulence: report ongoing flatulence, mild abdominal distention noted without ascites or tenderness. He denied nausea or vomiting. Patient had ED visit on 01/21/2021 for abdominal discomfort, he was evaluated and discharged on same day, diagnostics were unremarkable for any acute findings. Recommendation: Consider starting patient on Simethicone 128m after meals or at bedtime. Questions and concerns were addressed. Patient  was encouraged to call with questions and/or concerns, my business card was provided. Provided general support and encouragement, no other unmet needs identified at this time.  Follow up Palliative Care Visit: Palliative care will continue to follow for complex decision making and  symptom management. Return in about 6-8 weeks or prn.  Family /Caregiver/Community Supports: Patient is a [facility] in [facility]  Cognitive / Functional decline: Patient awake, alert and coherent, completes his ADLs with minimal assist. He is continent of bowel and bladder.  I spent 30 minutes providing this consultation, time includes time spent with patient, chart review, provider coordination, and documentation. More than 50% of the time in this consultation was spent counseling and coordinating communication.   CHIEF COMPLAINT: Gas/ flatulence  History obtained from review of EMR, discussion with primary facility staff, and interview with patient. Records reviewed and summarized bellow.  HISTORY OF PRESENT ILLNESS:  Christian BRANDISis a 80y.o. year old male with multiple medical problems including Dementia (FAST 6b), chronic back pain r/o spinal stenosis, arthritis, type 2 diabetes, OSA. Palliative Care was asked to follow this patient by consultation request of Christian [name], Christian Eng PA-C to help address advance care planning and goals of care. This is an initial visit.  CODE STATUS: DNR  PPS: 50%  HOSPICE ELIGIBILITY/DIAGNOSIS: TBD  ROS/staff/patient Constitutional: denies fever, denies chills EYES: denies vision changes ENMT: denies dysphagia Cardiovascular: denies chest pain, denies palpitation Pulmonary: denies cough, denies increased SOB Abdomen: endorses fair appetite, denies constipation, denies incontinence of bowel, endorsed flatulence GU: denies dysuria, endorses incontinence of urine MSK:  endorses ROM limitations, no falls reported Skin: denies rashes or wounds Neurological: endorses weakness, denies pain, denies insomnia Psych: Endorses positive mood Heme/lymph/immuno: denies bruises, abnormal bleeding   Physical Exam: General: frail appearing, cooperative, lying in bed in NAD EYES: anicteric sclera, lids intact, no discharge  ENMT: intact hearing, oral  mucous membranes moist CV:  no LE edema Pulmonary: no increased work  of breathing, no cough, no audible wheezes, room air Abdomen:  no ascites, +ve bowel sounds in 4 quadrants GU: deferred MSK: no contractures of LE, ambulatory Skin: warm and dry, no rashes or wounds on visible skin Neuro: Generalized weakness, A and O x 2 Psych: non-anxious affect today Hem/lymph/immuno: no widespread bruising   PAST MEDICAL HISTORY:  Past Medical History:  Diagnosis Date  . Agitation   . Arthritis   . Cataract   . Chronic back pain   . Confusion   . Diabetes ([place])    type 2  . DVT (deep venous thrombosis) ([place]) 01/2015  . Hearing impaired   . High cholesterol   . Hypertension   . Insomnia   . Insomnia   . Leg pain, diffuse   . Leg swelling   . Memory loss   . Mild cognitive impairment    sees [facility]  . Numbness    fingers, feet, toes  . OSA on CPAP   . 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 06/14/2015  . Pulmonary embolism ([place]) 01/2015  . Spinal stenosis    lumbar  . Tremor    on propranolol  . Wears glasses     SOCIAL HX:  Social History   Tobacco Use  . Smoking status: Former Smoker    Packs/day: 1.00    Years: 10.00    Pack years: 10.00    Types: Cigarettes    Quit date: 11/25/2004    Years since quitting: 16.2  . Smokeless tobacco: Never Used  Substance Use Topics  . Alcohol use: Yes    Alcohol/week: 1.0 standard drink    Types: 1 Glasses of wine per week    Comment: once a month per pt    FAMILY HX:  Family History  Problem Relation Age of Onset  . Dementia Father   . Clotting disorder Mother   . Heart disease Brother   . Clotting disorder Brother   . Psychiatric Illness Sister   . Leukemia Sister     ALLERGIES:  Allergies  Allergen Reactions  . Bee Venom Swelling  . Ultram [Tramadol Hcl]     Makes him wired, gives insomnia  . Oxycodone Other (See Comments)    Mental status changes  .  Oxycontin [Oxycodone Hcl] Other (See Comments)    Mental status changes per pt  . Zoloft [Sertraline Hcl] Other (See Comments)    Increases confusion per wife      PERTINENT MEDICATIONS:  Outpatient Encounter Medications as of 02/12/2021  Medication Sig  . acetaminophen (TYLENOL) 325 MG tablet Take 650 mg by mouth every 6 (six) hours as needed for mild pain or moderate pain.  [name] buPROPion (WELLBUTRIN XL) 150 MG 24 hr tablet Take 150 mg by mouth daily.  . busPIRone (BUSPAR) 5 MG tablet Take 5 mg by mouth 2 (two) times daily.  . chlorhexidine (PERIDEX) 0.12 % solution Use as directed 15 mLs in the mouth or throat at bedtime.  [name] diltiazem (CARDIZEM) 30 MG tablet TAKE 1 TABLET(30 MG) BY MOUTH TWICE DAILY (Patient taking differently: Take 30 mg by mouth 2 (two) times daily.)  . docusate sodium (COLACE) 100 MG capsule Take 100 mg by mouth daily.  [name] donepezil (ARICEPT) 10 MG tablet Take 1 tablet (10 mg) by mouth at bedtime.  . ferrous sulfate 325 (65 FE) MG tablet Take 325 mg by mouth daily with breakfast.  . flecainide (TAMBOCOR) 150 MG tablet TAKE 1/2 TABLET(75  MG) BY MOUTH TWICE DAILY (Patient taking differently: Take 75 mg by mouth 2 (two) times daily.)  . folic acid (FOLVITE) 1 MG tablet Take 1 mg daily (Patient taking differently: Take 1 mg by mouth in the morning.)  . furosemide (LASIX) 40 MG tablet Take 40 mg daily AS NEEDED  For swelling. (Patient taking differently: Take 40 mg by mouth daily.)  . insulin lispro (HUMALOG) 100 UNIT/ML KwikPen Inject 2-10 Units into the skin See admin instructions. 71-150= 0 units, 151-200=2 units, 201-250=4 units, 251-300= 6 units, 301-350= 8 units, 351-400=10 units, >400 call md     Sliding scale  . lisinopril (ZESTRIL) 10 MG tablet Take 10 mg by mouth daily.  . memantine (NAMENDA XR) 28 MG CP24 24 hr capsule TAKE 1 CAPSULE(28 MG) BY MOUTH DAILY (Patient taking differently: Take 28 mg by mouth in the morning.)  . Misc Natural Products (OSTEO BI-FLEX ADV JOINT  SHIELD PO) Take 2 tablets by mouth daily.   . polyethylene glycol (MIRALAX / GLYCOLAX) 17 g packet Take 17 g by mouth 2 (two) times daily.  . rivaroxaban (XARELTO) 20 MG TABS tablet TAKE 1 TABLET BY MOUTH EVERY DAY WITH SUPPER (Patient taking differently: Take 20 mg by mouth daily with supper.)  . rosuvastatin (CRESTOR) 10 MG tablet TAKE 1 TABLET(10 MG) BY MOUTH AT BEDTIME (Patient taking differently: Take 10 mg by mouth at bedtime.)  . tamsulosin (FLOMAX) 0.4 MG CAPS capsule TAKE 1 CAPSULE(0.4 MG) BY MOUTH DAILY (Patient taking differently: Take 0.4 mg by mouth in the morning.)  . tolterodine (DETROL LA) 4 MG 24 hr capsule Take 1 capsule (4 mg total) by mouth at bedtime.  . TRULICITY 1.49 FW/2.6VZ SOPN Inject 1.5 mg into the skin every Wednesday.  . vitamin B-12 (CYANOCOBALAMIN) 500 MCG tablet Take 1 tablet (500 mcg total) by mouth daily.   No facility-administered encounter medications on file as of 02/12/2021.    Thank you for the opportunity to participate in the care of Mr. Dallas Schimke. The palliative care team will continue to follow. Please call our office at [phone] if we can be of additional assistance.  Jari Favre, DNP, AGPCNP-BC

## 2021-02-26 DIAGNOSIS — D529 Folate deficiency anemia, unspecified: Secondary | ICD-10-CM | POA: Diagnosis not present

## 2021-02-28 DIAGNOSIS — K219 Gastro-esophageal reflux disease without esophagitis: Secondary | ICD-10-CM | POA: Diagnosis not present

## 2021-02-28 DIAGNOSIS — K5900 Constipation, unspecified: Secondary | ICD-10-CM | POA: Diagnosis not present

## 2021-02-28 DIAGNOSIS — R109 Unspecified abdominal pain: Secondary | ICD-10-CM | POA: Diagnosis not present

## 2021-02-28 DIAGNOSIS — F419 Anxiety disorder, unspecified: Secondary | ICD-10-CM | POA: Diagnosis not present

## 2021-02-28 DIAGNOSIS — I10 Essential (primary) hypertension: Secondary | ICD-10-CM | POA: Diagnosis not present

## 2021-02-28 DIAGNOSIS — R140 Abdominal distension (gaseous): Secondary | ICD-10-CM | POA: Diagnosis not present

## 2021-02-28 DIAGNOSIS — R600 Localized edema: Secondary | ICD-10-CM | POA: Diagnosis not present

## 2021-02-28 DIAGNOSIS — E119 Type 2 diabetes mellitus without complications: Secondary | ICD-10-CM | POA: Diagnosis not present

## 2021-02-28 DIAGNOSIS — R110 Nausea: Secondary | ICD-10-CM | POA: Diagnosis not present

## 2021-02-28 DIAGNOSIS — F329 Major depressive disorder, single episode, unspecified: Secondary | ICD-10-CM | POA: Diagnosis not present

## 2021-03-01 ENCOUNTER — Encounter: Payer: Self-pay | Admitting: Gastroenterology

## 2021-03-01 DIAGNOSIS — I482 Chronic atrial fibrillation, unspecified: Secondary | ICD-10-CM | POA: Diagnosis not present

## 2021-03-01 DIAGNOSIS — D649 Anemia, unspecified: Secondary | ICD-10-CM | POA: Diagnosis not present

## 2021-03-01 DIAGNOSIS — F0390 Unspecified dementia without behavioral disturbance: Secondary | ICD-10-CM | POA: Diagnosis not present

## 2021-03-01 DIAGNOSIS — I739 Peripheral vascular disease, unspecified: Secondary | ICD-10-CM | POA: Diagnosis not present

## 2021-03-01 DIAGNOSIS — R140 Abdominal distension (gaseous): Secondary | ICD-10-CM | POA: Diagnosis not present

## 2021-03-01 DIAGNOSIS — K219 Gastro-esophageal reflux disease without esophagitis: Secondary | ICD-10-CM | POA: Diagnosis not present

## 2021-03-01 DIAGNOSIS — R110 Nausea: Secondary | ICD-10-CM | POA: Diagnosis not present

## 2021-03-01 DIAGNOSIS — I10 Essential (primary) hypertension: Secondary | ICD-10-CM | POA: Diagnosis not present

## 2021-03-01 DIAGNOSIS — K5900 Constipation, unspecified: Secondary | ICD-10-CM | POA: Diagnosis not present

## 2021-03-02 DIAGNOSIS — R109 Unspecified abdominal pain: Secondary | ICD-10-CM | POA: Diagnosis not present

## 2021-03-03 DIAGNOSIS — K567 Ileus, unspecified: Secondary | ICD-10-CM | POA: Diagnosis not present

## 2021-03-04 ENCOUNTER — Encounter (HOSPITAL_COMMUNITY): Payer: Self-pay

## 2021-03-04 ENCOUNTER — Emergency Department (HOSPITAL_COMMUNITY): Payer: Medicare Other

## 2021-03-04 ENCOUNTER — Other Ambulatory Visit: Payer: Self-pay

## 2021-03-04 ENCOUNTER — Emergency Department (HOSPITAL_COMMUNITY)
Admission: EM | Admit: 2021-03-04 | Discharge: 2021-03-04 | Disposition: A | Discharge status: Home / Self Care | Payer: Medicare Other | Attending: Emergency Medicine | Admitting: Emergency Medicine

## 2021-03-04 DIAGNOSIS — R140 Abdominal distension (gaseous): Secondary | ICD-10-CM | POA: Insufficient documentation

## 2021-03-04 DIAGNOSIS — Z87891 Personal history of nicotine dependence: Secondary | ICD-10-CM | POA: Insufficient documentation

## 2021-03-04 DIAGNOSIS — I10 Essential (primary) hypertension: Secondary | ICD-10-CM | POA: Insufficient documentation

## 2021-03-04 DIAGNOSIS — F0390 Unspecified dementia without behavioral disturbance: Secondary | ICD-10-CM | POA: Insufficient documentation

## 2021-03-04 DIAGNOSIS — Z79899 Other long term (current) drug therapy: Secondary | ICD-10-CM | POA: Insufficient documentation

## 2021-03-04 DIAGNOSIS — R339 Retention of urine, unspecified: Secondary | ICD-10-CM | POA: Diagnosis not present

## 2021-03-04 DIAGNOSIS — E119 Type 2 diabetes mellitus without complications: Secondary | ICD-10-CM | POA: Insufficient documentation

## 2021-03-04 DIAGNOSIS — Z7401 Bed confinement status: Secondary | ICD-10-CM | POA: Diagnosis not present

## 2021-03-04 DIAGNOSIS — R5381 Other malaise: Secondary | ICD-10-CM | POA: Diagnosis not present

## 2021-03-04 DIAGNOSIS — M2550 Pain in unspecified joint: Secondary | ICD-10-CM | POA: Diagnosis not present

## 2021-03-04 DIAGNOSIS — R1111 Vomiting without nausea: Secondary | ICD-10-CM | POA: Diagnosis not present

## 2021-03-04 DIAGNOSIS — R110 Nausea: Secondary | ICD-10-CM | POA: Diagnosis not present

## 2021-03-04 DIAGNOSIS — I700 Atherosclerosis of aorta: Secondary | ICD-10-CM | POA: Diagnosis not present

## 2021-03-04 DIAGNOSIS — R112 Nausea with vomiting, unspecified: Secondary | ICD-10-CM | POA: Diagnosis not present

## 2021-03-04 DIAGNOSIS — R109 Unspecified abdominal pain: Secondary | ICD-10-CM | POA: Insufficient documentation

## 2021-03-04 DIAGNOSIS — Z7984 Long term (current) use of oral hypoglycemic drugs: Secondary | ICD-10-CM | POA: Insufficient documentation

## 2021-03-04 DIAGNOSIS — Z794 Long term (current) use of insulin: Secondary | ICD-10-CM | POA: Insufficient documentation

## 2021-03-04 DIAGNOSIS — N281 Cyst of kidney, acquired: Secondary | ICD-10-CM | POA: Diagnosis not present

## 2021-03-04 DIAGNOSIS — N400 Enlarged prostate without lower urinary tract symptoms: Secondary | ICD-10-CM | POA: Diagnosis not present

## 2021-03-04 LAB — BASIC METABOLIC PANEL
Anion gap: 13 (ref 5–15)
BUN: 28 mg/dL — ABNORMAL HIGH (ref 8–23)
CO2: 17 mmol/L — ABNORMAL LOW (ref 22–32)
Calcium: 9.4 mg/dL (ref 8.9–10.3)
Chloride: 101 mmol/L (ref 98–111)
Creatinine, Ser: 1.29 mg/dL — ABNORMAL HIGH (ref 0.61–1.24)
GFR, Estimated: 57 mL/min — ABNORMAL LOW (ref >60)
Glucose, Bld: 124 mg/dL — ABNORMAL HIGH (ref 70–99)
Potassium: 4.5 mmol/L (ref 3.5–5.1)
Sodium: 131 mmol/L — ABNORMAL LOW (ref 135–145)

## 2021-03-04 LAB — CBC WITH DIFFERENTIAL/PLATELET
Abs Immature Granulocytes: 0.09 10*3/uL — ABNORMAL HIGH (ref 0.00–0.07)
Basophils Absolute: 0.1 10*3/uL (ref 0.0–0.1)
Basophils Relative: 1 %
Eosinophils Absolute: 0.2 10*3/uL (ref 0.0–0.5)
Eosinophils Relative: 1 %
HCT: 43.3 % (ref 39.0–52.0)
Hemoglobin: 14 g/dL (ref 13.0–17.0)
Immature Granulocytes: 1 %
Lymphocytes Relative: 9 %
Lymphs Abs: 1.3 10*3/uL (ref 0.7–4.0)
MCH: 30.5 pg (ref 26.0–34.0)
MCHC: 32.3 g/dL (ref 30.0–36.0)
MCV: 94.3 fL (ref 80.0–100.0)
Monocytes Absolute: 1 10*3/uL (ref 0.1–1.0)
Monocytes Relative: 7 %
Neutro Abs: 11.1 10*3/uL — ABNORMAL HIGH (ref 1.7–7.7)
Neutrophils Relative %: 81 %
Platelets: 277 10*3/uL (ref 150–400)
RBC: 4.59 MIL/uL (ref 4.22–5.81)
RDW: 12.2 % (ref 11.5–15.5)
WBC: 13.8 10*3/uL — ABNORMAL HIGH (ref 4.0–10.5)
nRBC: 0 % (ref 0.0–0.2)

## 2021-03-04 LAB — URINALYSIS, ROUTINE W REFLEX MICROSCOPIC
Bilirubin Urine: NEGATIVE
Glucose, UA: NEGATIVE mg/dL
Hgb urine dipstick: NEGATIVE
Ketones, ur: 20 mg/dL — AB
Leukocytes,Ua: NEGATIVE
Nitrite: NEGATIVE
Protein, ur: NEGATIVE mg/dL
Specific Gravity, Urine: 1.02 (ref 1.005–1.030)
pH: 5 (ref 5.0–8.0)

## 2021-03-04 LAB — HEPATIC FUNCTION PANEL
ALT: 12 U/L (ref 0–44)
AST: 15 U/L (ref 15–41)
Albumin: 4.1 g/dL (ref 3.5–5.0)
Alkaline Phosphatase: 84 U/L (ref 38–126)
Bilirubin, Direct: 0.1 mg/dL (ref 0.0–0.2)
Indirect Bilirubin: 0.6 mg/dL (ref 0.3–0.9)
Total Bilirubin: 0.7 mg/dL (ref 0.3–1.2)
Total Protein: 7.4 g/dL (ref 6.5–8.1)

## 2021-03-04 LAB — LIPASE, BLOOD: Lipase: 34 U/L (ref 11–51)

## 2021-03-04 MED ORDER — SODIUM CHLORIDE 0.9 % IV BOLUS
500.0000 mL | Freq: Once | INTRAVENOUS | Status: AC
  Administered 2021-03-04: 500 mL via INTRAVENOUS

## 2021-03-04 MED ORDER — IOHEXOL 300 MG/ML  SOLN
100.0000 mL | Freq: Once | INTRAMUSCULAR | Status: AC | PRN
  Administered 2021-03-04: 100 mL via INTRAVENOUS

## 2021-03-04 NOTE — ED Provider Notes (Signed)
[facility] DEPT Provider Note   CSN: [id number] Arrival date & time: 03/04/21  2706     History Chief Complaint  Patient presents with  . Nausea    DAWAYNE OHAIR is a 80 y.o. male.  HPI     80 year old presents to the ER with chief complaint of nausea.  Level 5 caveat for dementia.  Patient reports that he had nausea starting today and an episode of emesis. He denies any abdominal pain.  I called the nursing home, was informed that patient had urinary retention and abdominal discomfort.  EMS reports that patient had an episode of emesis and he has been having urinary retention.  Addendum: I was about to call family before discharging the patient, but the spouse called [place] before I  Did.  She reports that patient has had poor BM and there was concern for ileus.  I informed her that the report we received from nursing home and the call from [place], confirmed urinary retention.  I informed her the work-up thus far has been fine and patient is passed oral challenge, but we will expand the work-up and get CT scan.  Thereafter, I received a call from nursing home nurse as well to confirm that we were going to get CT scan.  Past Medical History:  Diagnosis Date  . Agitation   . Arthritis   . Cataract   . Chronic back pain   . Confusion   . Diabetes ([place])    type 2  . DVT (deep venous thrombosis) ([place]) 01/2015  . Hearing impaired   . High cholesterol   . Hypertension   . Insomnia   . Insomnia   . Leg pain, diffuse   . Leg swelling   . Memory loss   . Mild cognitive impairment    sees [facility]  . Numbness    fingers, feet, toes  . OSA on CPAP   . 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 06/14/2015  . Pulmonary embolism ([place]) 01/2015  . Spinal stenosis    lumbar  . Tremor    on propranolol  . Wears glasses     Patient Active Problem List   Diagnosis Date Noted  . Falls frequently  04/13/2020  . Hyperglycemia due to diabetes mellitus ([place]) 04/13/2020  . Acute metabolic encephalopathy 23/76/2831  . Ambulatory dysfunction 04/12/2020  . Hyperlipidemia associated with type 2 diabetes mellitus ([place]) 03/14/2020  . Acute blood loss anemia 03/14/2020  . Chronic constipation 03/14/2020  . Depression due to dementia ([place]) 03/10/2020  . Closed displaced intertrochanteric fracture of left femur ([place])   . S/p left hip fracture 03/03/2020  . Benign prostatic hyperplasia with lower urinary tract symptoms 06/04/2019  . Urge incontinence 06/04/2019  . History of stroke 06/02/2019  . Medicare annual wellness visit, subsequent 06/02/2019  . Urinary incontinence 10/28/2018  . Polyuria 10/28/2018  . Dermatitis 10/28/2018  . Tinea cruris 03/25/2018  . Tick bite 03/25/2018  . Balanitis 03/25/2018  . At moderate risk for fall 12/31/2017  . Alcohol abuse 12/29/2017  . History of pulmonary embolism 12/29/2017  . Depression, recurrent ([place]) 12/29/2017  . Alcohol intoxication ([place]) 12/16/2017  . Wears hearing aid 06/09/2017  . Tinnitus of both ears 06/09/2017  . Bilateral impacted cerumen 06/09/2017  . Left wrist pain 06/26/2016  . Morning joint stiffness 06/26/2016  . Polyarthralgia 06/26/2016  . Insulin dependent type 2 diabetes mellitus, uncontrolled ([place]) 04/02/2016  . Gait disturbance  04/02/2016  . Physical deconditioning 04/02/2016  . Pain of both shoulder joints 04/02/2016  . Leg weakness, bilateral 04/02/2016  . Encounter for therapeutic drug monitoring 11/29/2015  . Encounter for health maintenance examination in adult 10/30/2015  . Shoulder pain, bilateral 10/30/2015  . Muscle weakness 10/30/2015  . Generalized weakness 10/30/2015  . Rash 10/30/2015  . PVC (premature ventricular contraction) 10/10/2015  . Decreased energy 06/20/2015  . Chronic pulmonary embolism ([place]) 06/20/2015  . DVT (deep venous thrombosis) ([place]) 06/20/2015  . Hammer toe of left foot 06/20/2015   . Pre-ulcerative calluses 06/20/2015  . Hypertrophic toenail 06/20/2015  . Diabetic mononeuropathy associated with diabetes mellitus due to underlying condition ([place]) 06/20/2015  . Leg pain, bilateral 06/20/2015  . Chronic low back pain 06/20/2015  . 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 06/14/2015  . OSA on CPAP 03/06/2015  . Edema 03/06/2015  . Leg pain, diffuse 03/06/2015  . Bilateral low back pain without sciatica 03/06/2015  . Tremor 03/06/2015  . Insomnia 03/06/2015  . Vaccine counseling 03/06/2015  . Chronic back pain 03/06/2015  . Wears glasses 03/06/2015  . Hearing loss 03/06/2015  . Spinal stenosis of lumbar region 03/06/2015  . Ptosis 03/06/2015  . Positive depression screening 03/06/2015  . Advance directive discussed with patient 03/06/2015  . Bradycardia 02/27/2015  . Current use of long term anticoagulation   . Dementia without behavioral disturbance ([place])   . Hypertension associated with type 2 diabetes mellitus ([place]) 02/08/2015  . Hyperlipidemia 02/08/2015  . OSA (obstructive sleep apnea) 02/08/2015  . Memory loss 03/12/2013  . Diabetes mellitus ([place]) 03/12/2013    Past Surgical History:  Procedure Laterality Date  . CATARACT EXTRACTION    . COLONOSCOPY  2014  . INTRAMEDULLARY (IM) NAIL INTERTROCHANTERIC Left 03/05/2020   Procedure: INTRAMEDULLARY (IM) NAIL INTERTROCHANTRIC;  Surgeon: Carole Civil, MD;  Location: [facility];  Service: Orthopedics;  Laterality: Left;  . TONSILLECTOMY         Family History  Problem Relation Age of Onset  . Dementia Father   . Clotting disorder Mother   . Heart disease Brother   . Clotting disorder Brother   . Psychiatric Illness Sister   . Leukemia Sister     Social History   Tobacco Use  . Smoking status: Former Smoker    Packs/day: 1.00    Years: 10.00    Pack years: 10.00    Types: Cigarettes    Quit date: 11/25/2004    Years since quitting: 16.2  .  Smokeless tobacco: Never Used  Vaping Use  . Vaping Use: Never used  Substance Use Topics  . Alcohol use: Yes    Alcohol/week: 1.0 standard drink    Types: 1 Glasses of wine per week    Comment: once a month per pt   . Drug use: Not Currently    Home Medications Prior to Admission medications   Medication Sig Start Date End Date Taking? Authorizing Provider  acetaminophen (TYLENOL) 325 MG tablet Take 650 mg by mouth 2 (two) times daily.   Yes [provider]  buPROPion (WELLBUTRIN SR) 150 MG 12 hr tablet Take 300 mg by mouth daily.   Yes [provider]  busPIRone (BUSPAR) 5 MG tablet Take 5 mg by mouth 2 (two) times daily.   Yes [provider]  chlorhexidine (PERIDEX) 0.12 % solution Use as directed 15 mLs in the mouth or throat at bedtime. 08/02/20  Yes [provider]  DENTA 5000 PLUS 1.1 % CREA dental cream Take 1 application by mouth at bedtime. Use a pea sized amount on toothbrush, spit out excess, don't rinse per dentist 02/02/21  Yes [provider]  diltiazem (CARDIZEM) 30 MG tablet TAKE 1 TABLET(30 MG) BY MOUTH TWICE DAILY Patient taking differently: Take 30 mg by mouth 2 (two) times daily. 04/06/20  Yes Gerlene Fee, NP  docusate sodium (COLACE) 100 MG capsule Take 100 mg by mouth daily.   Yes [provider]  donepezil (ARICEPT) 10 MG tablet Take 1 tablet (10 mg) by mouth at bedtime. 04/06/20  Yes Gerlene Fee, NP  famotidine (PEPCID) 20 MG tablet Take 20 mg by mouth daily. 03/02/21  Yes [provider]  ferrous sulfate 325 (65 FE) MG tablet Take 325 mg by mouth daily with breakfast.   Yes [provider]  flecainide (TAMBOCOR) 150 MG tablet TAKE 1/2 TABLET(75 MG) BY MOUTH TWICE DAILY Patient taking differently: Take 75 mg by mouth 2 (two) times daily. 04/06/20  Yes Gerlene Fee, NP  folic acid (FOLVITE) 1 MG tablet Take 1 mg daily Patient taking differently: Take 1 mg by mouth in the morning. 04/06/20   Yes Gerlene Fee, NP  furosemide (LASIX) 40 MG tablet Take 40 mg daily AS NEEDED  For swelling. Patient taking differently: Take 40 mg by mouth daily. 04/06/20  Yes Gerlene Fee, NP  insulin lispro (HUMALOG) 100 UNIT/ML KwikPen Inject 2-10 Units into the skin See admin instructions. 71-150= 0 units, 151-200=2 units, 201-250=4 units, 251-300= 6 units, 301-350= 8 units, 351-400=10 units, >400 call md     Sliding scale   Yes [provider]  lisinopril (ZESTRIL) 10 MG tablet Take 10 mg by mouth daily. 07/23/20  Yes [provider]  memantine (NAMENDA XR) 28 MG CP24 24 hr capsule TAKE 1 CAPSULE(28 MG) BY MOUTH DAILY Patient taking differently: Take 28 mg by mouth in the morning. 04/06/20  Yes Gerlene Fee, NP  metFORMIN (GLUCOPHAGE) 1000 MG tablet Take 1,000 mg by mouth 2 (two) times daily. 02/23/21  Yes [provider]  Misc Natural Products (OSTEO BI-FLEX ADV JOINT SHIELD PO) Take 2 tablets by mouth daily.    Yes [provider]  ondansetron (ZOFRAN) 4 MG tablet Take 4 mg by mouth every 6 (six) hours as needed for nausea. 02/02/21  Yes [provider]  polyethylene glycol (MIRALAX / GLYCOLAX) 17 g packet Take 17 g by mouth daily. 03/10/20  Yes [provider]  rivaroxaban (XARELTO) 20 MG TABS tablet TAKE 1 TABLET BY MOUTH EVERY DAY WITH SUPPER Patient taking differently: Take 20 mg by mouth daily with supper. 04/06/20  Yes Gerlene Fee, NP  rosuvastatin (CRESTOR) 10 MG tablet TAKE 1 TABLET(10 MG) BY MOUTH AT BEDTIME Patient taking differently: Take 10 mg by mouth daily. 04/06/20  Yes Gerlene Fee, NP  tamsulosin (FLOMAX) 0.4 MG CAPS capsule TAKE 1 CAPSULE(0.4 MG) BY MOUTH DAILY Patient taking differently: Take 0.4 mg by mouth in the morning. 04/06/20  Yes Gerlene Fee, NP  tolterodine (DETROL LA) 4 MG 24 hr capsule Take 1 capsule (4 mg total) by mouth at bedtime. 04/06/20  Yes Gerlene Fee, NP  TRESIBA FLEXTOUCH 100 UNIT/ML  FlexTouch Pen Inject 14 Units into the skin every morning. 02/01/21  Yes [provider]  TRULICITY 7.41 UL/8.4TX SOPN Inject 1.5 mg into the skin every Wednesday. 07/18/20  Yes [provider]  vitamin B-12 (CYANOCOBALAMIN) 500  MCG tablet Take 1 tablet (500 mcg total) by mouth daily. 04/15/20  Yes Tat, Shanon Brow, MD  bisacodyl (DULCOLAX) 10 MG suppository Place 10 mg rectally once.    [provider]  sodium phosphate Pediatric (FLEET) 3.5-9.5 GM/59ML enema Place 1 enema rectally once.    [provider]    Allergies    Bee venom, Ultram [tramadol hcl], Oxycodone, Oxycontin [oxycodone hcl], and Zoloft [sertraline hcl]  Review of Systems   Review of Systems  Unable to perform ROS: Dementia    Physical Exam Updated Vital Signs BP 109/82   Pulse 80   Temp 97.7 F (36.5 C) (Oral)   Resp 16   Wt 108 kg   SpO2 97%   BMI 33.21 kg/m   Physical Exam Vitals and nursing note reviewed.  Constitutional:      Appearance: He is well-developed.  HENT:     Head: Atraumatic.  Cardiovascular:     Rate and Rhythm: Normal rate.  Pulmonary:     Effort: Pulmonary effort is normal.  Abdominal:     General: There is distension.     Tenderness: There is no abdominal tenderness. There is no guarding or rebound.     Comments: Hypoactive bowel sound  Musculoskeletal:     Cervical back: Neck supple.  Skin:    General: Skin is warm.  Neurological:     Mental Status: He is alert and oriented to person, place, and time.     ED Results / Procedures / Treatments   Labs (all labs ordered are listed, but only abnormal results are displayed) Labs Reviewed  BASIC METABOLIC PANEL - Abnormal; Notable for the following components:      Result Value   Sodium 131 (*)    CO2 17 (*)    Glucose, Bld 124 (*)    BUN 28 (*)    Creatinine, Ser 1.29 (*)    GFR, Estimated 57 (*)    All other components within normal limits  CBC WITH DIFFERENTIAL/PLATELET - Abnormal; Notable  for the following components:   WBC 13.8 (*)    Neutro Abs 11.1 (*)    Abs Immature Granulocytes 0.09 (*)    All other components within normal limits  URINALYSIS, ROUTINE W REFLEX MICROSCOPIC - Abnormal; Notable for the following components:   APPearance HAZY (*)    Ketones, ur 20 (*)    All other components within normal limits  HEPATIC FUNCTION PANEL  LIPASE, BLOOD    EKG None  Radiology No results found.  Procedures Procedures   Medications Ordered in ED Medications  sodium chloride 0.9 % bolus 500 mL (500 mLs Intravenous New Bag/Given 03/04/21 1503)  iohexol (OMNIPAQUE) 300 MG/ML solution 100 mL (100 mLs Intravenous Contrast Given 03/04/21 1529)    ED Course  I have reviewed the triage vital signs and the nursing notes.  Pertinent labs & imaging results that were available during my care of the patient were reviewed by me and considered in my medical decision making (see chart for details).    MDM Rules/Calculators/A&P                          80 year old male comes in with chief complaint of urinary retention.  Bladder scan ordered.  There was 200 cc finding on it. Patient has distended abdomen, but does not appear that it is because of acute urinary retention. Nursing staff and I have decided to put condom cath to ensure he  can urinate.  Patient was reassessed and he did void.  We are ordered some basic labs, they are reassuring.  Oral challenge initiated and patient passed it.  Patient's abdomen is distended -but there is no focal tenderness.  I have reviewed patient's prior CT scan and ER visit for GI issues.  At that time a CT abdomen pelvis with contrast was reassuring.  Additionally, patient has had chronic GI issues it appears and palliative services also seen him for it.  If patient passes oral challenge, plan is to discharge him with IV hydration.  4:04 PM Due to additional history that has been mentioned in HPI, plan is to now proceed with CT abdomen  pelvis with contrast to ensure that there is no small bowel obstruction.  It appears that patient had ileus and not getting better over the last couple of days.  He has no abdominal surgeries, therefore obstruction concerns are low.  Final Clinical Impression(s) / ED Diagnoses Final diagnoses:  Nausea    Rx / DC Orders ED Discharge Orders    None       Varney Biles, MD 03/04/21 1605

## 2021-03-04 NOTE — ED Triage Notes (Signed)
Pt brought in by EMS for urinary retention, but pts brief noted to be wet. Pt denies urinary retention, abd pain. States that he was nauseated this morning. As per EMS pt has episode of vomiting at facility PTA.

## 2021-03-04 NOTE — ED Notes (Signed)
Condom cath in place.

## 2021-03-04 NOTE — ED Notes (Signed)
No urine in bag at this time. Pt denies complaints, states "I want to eat". Notified MD Kathrynn Humble

## 2021-03-04 NOTE — ED Notes (Signed)
Printed PTAR paperwork and called report to [facility]

## 2021-03-04 NOTE — ED Notes (Signed)
Pt denies any nausea after eating and drinking

## 2021-03-04 NOTE — ED Notes (Signed)
ptar contacted for pt transport back to [facility].

## 2021-03-04 NOTE — Discharge Instructions (Addendum)
The source of Christian [name] constipation was not identified today.  He may use miralax, twice daily for constipation until he has a soft bowel movement.  Please continue to use dulcolax at home for constipation as well.

## 2021-03-04 NOTE — ED Provider Notes (Signed)
Patient care assumed at 1500. Patient with history of dementia, atrial fibrillation on anticoagulation here for evaluation of nausea, urinary retention and constipation. CT abdomen pelvis pending at time of signout.  CT is negative for acute abnormality. BMP with mild hyponatremia, bicarb is low but no elevation anion gap. No evidence of DKA or serious metabolic derangements. CBC mild leukocytosis but there is no clear evidence of acute infectious process at this time. Per report there was a concern for ileus due to no bowel movement and abnormal plain film. CT scan with no evidence of obstruction or ileus. Will treat for constipation. Patient is requesting food and drink and eating and drinking without difficulty in the department. Plan to discharge back to facility with outpatient follow-up and return precautions.   Quintella Reichert, MD 03/05/21 [phone]

## 2021-03-05 DIAGNOSIS — D649 Anemia, unspecified: Secondary | ICD-10-CM | POA: Diagnosis not present

## 2021-03-05 DIAGNOSIS — L570 Actinic keratosis: Secondary | ICD-10-CM | POA: Diagnosis not present

## 2021-03-05 DIAGNOSIS — G8929 Other chronic pain: Secondary | ICD-10-CM | POA: Diagnosis not present

## 2021-03-05 DIAGNOSIS — I739 Peripheral vascular disease, unspecified: Secondary | ICD-10-CM | POA: Diagnosis not present

## 2021-03-05 DIAGNOSIS — E871 Hypo-osmolality and hyponatremia: Secondary | ICD-10-CM | POA: Diagnosis not present

## 2021-03-05 DIAGNOSIS — K219 Gastro-esophageal reflux disease without esophagitis: Secondary | ICD-10-CM | POA: Diagnosis not present

## 2021-03-05 DIAGNOSIS — K567 Ileus, unspecified: Secondary | ICD-10-CM | POA: Diagnosis not present

## 2021-03-05 DIAGNOSIS — I10 Essential (primary) hypertension: Secondary | ICD-10-CM | POA: Diagnosis not present

## 2021-03-06 DIAGNOSIS — E1169 Type 2 diabetes mellitus with other specified complication: Secondary | ICD-10-CM | POA: Diagnosis not present

## 2021-03-07 ENCOUNTER — Telehealth: Payer: Self-pay | Admitting: Gastroenterology

## 2021-03-07 NOTE — Telephone Encounter (Signed)
Hey Dr. Havery Moros,   We received records from [facility] requesting an office visit for nausea. Records will be place on your desk to review.

## 2021-03-07 NOTE — Telephone Encounter (Signed)
If this patient specifically requesting me or should this go to the [facility] as I am out of the office all week on vacation. If he is requesting to see me I will review records when I get back into the office

## 2021-03-07 NOTE — Progress Notes (Signed)
NP paperwork from [facility].

## 2021-03-08 NOTE — Telephone Encounter (Signed)
Hey,   Sorry to include. Yes, patient is requesting you. His wife is already a patient of yours and would like to be seen by you as well. I will let them know you will review when you are back.   Thank you.

## 2021-03-08 NOTE — Telephone Encounter (Signed)
Okay thanks, that's fine, I'm happy to see him.

## 2021-03-12 DIAGNOSIS — N400 Enlarged prostate without lower urinary tract symptoms: Secondary | ICD-10-CM | POA: Diagnosis not present

## 2021-03-12 DIAGNOSIS — K5900 Constipation, unspecified: Secondary | ICD-10-CM | POA: Diagnosis not present

## 2021-03-12 DIAGNOSIS — E785 Hyperlipidemia, unspecified: Secondary | ICD-10-CM | POA: Diagnosis not present

## 2021-03-12 DIAGNOSIS — K567 Ileus, unspecified: Secondary | ICD-10-CM | POA: Diagnosis not present

## 2021-03-12 DIAGNOSIS — D6852 Prothrombin gene mutation: Secondary | ICD-10-CM | POA: Diagnosis not present

## 2021-03-12 DIAGNOSIS — R600 Localized edema: Secondary | ICD-10-CM | POA: Diagnosis not present

## 2021-03-12 DIAGNOSIS — F329 Major depressive disorder, single episode, unspecified: Secondary | ICD-10-CM | POA: Diagnosis not present

## 2021-03-12 DIAGNOSIS — K219 Gastro-esophageal reflux disease without esophagitis: Secondary | ICD-10-CM | POA: Diagnosis not present

## 2021-03-12 DIAGNOSIS — E119 Type 2 diabetes mellitus without complications: Secondary | ICD-10-CM | POA: Diagnosis not present

## 2021-03-19 DIAGNOSIS — F331 Major depressive disorder, recurrent, moderate: Secondary | ICD-10-CM | POA: Diagnosis not present

## 2021-03-19 DIAGNOSIS — F0390 Unspecified dementia without behavioral disturbance: Secondary | ICD-10-CM | POA: Diagnosis not present

## 2021-03-19 DIAGNOSIS — F4321 Adjustment disorder with depressed mood: Secondary | ICD-10-CM | POA: Diagnosis not present

## 2021-03-19 DIAGNOSIS — N400 Enlarged prostate without lower urinary tract symptoms: Secondary | ICD-10-CM | POA: Diagnosis not present

## 2021-03-19 DIAGNOSIS — D649 Anemia, unspecified: Secondary | ICD-10-CM | POA: Diagnosis not present

## 2021-03-19 DIAGNOSIS — F0391 Unspecified dementia with behavioral disturbance: Secondary | ICD-10-CM | POA: Diagnosis not present

## 2021-03-19 DIAGNOSIS — E119 Type 2 diabetes mellitus without complications: Secondary | ICD-10-CM | POA: Diagnosis not present

## 2021-03-19 DIAGNOSIS — I10 Essential (primary) hypertension: Secondary | ICD-10-CM | POA: Diagnosis not present

## 2021-03-19 DIAGNOSIS — E785 Hyperlipidemia, unspecified: Secondary | ICD-10-CM | POA: Diagnosis not present

## 2021-03-19 DIAGNOSIS — F329 Major depressive disorder, single episode, unspecified: Secondary | ICD-10-CM | POA: Diagnosis not present

## 2021-04-09 DIAGNOSIS — E119 Type 2 diabetes mellitus without complications: Secondary | ICD-10-CM | POA: Diagnosis not present

## 2021-04-09 DIAGNOSIS — N400 Enlarged prostate without lower urinary tract symptoms: Secondary | ICD-10-CM | POA: Diagnosis not present

## 2021-04-09 DIAGNOSIS — I482 Chronic atrial fibrillation, unspecified: Secondary | ICD-10-CM | POA: Diagnosis not present

## 2021-04-09 DIAGNOSIS — F329 Major depressive disorder, single episode, unspecified: Secondary | ICD-10-CM | POA: Diagnosis not present

## 2021-04-09 DIAGNOSIS — R600 Localized edema: Secondary | ICD-10-CM | POA: Diagnosis not present

## 2021-04-09 DIAGNOSIS — K5900 Constipation, unspecified: Secondary | ICD-10-CM | POA: Diagnosis not present

## 2021-04-09 DIAGNOSIS — K219 Gastro-esophageal reflux disease without esophagitis: Secondary | ICD-10-CM | POA: Diagnosis not present

## 2021-04-09 DIAGNOSIS — K567 Ileus, unspecified: Secondary | ICD-10-CM | POA: Diagnosis not present

## 2021-04-09 DIAGNOSIS — I10 Essential (primary) hypertension: Secondary | ICD-10-CM | POA: Diagnosis not present

## 2021-04-09 DIAGNOSIS — E785 Hyperlipidemia, unspecified: Secondary | ICD-10-CM | POA: Diagnosis not present

## 2021-04-09 DIAGNOSIS — U071 COVID-19: Secondary | ICD-10-CM | POA: Diagnosis not present

## 2021-04-10 DIAGNOSIS — D529 Folate deficiency anemia, unspecified: Secondary | ICD-10-CM | POA: Diagnosis not present

## 2021-04-10 DIAGNOSIS — I10 Essential (primary) hypertension: Secondary | ICD-10-CM | POA: Diagnosis not present

## 2021-04-10 DIAGNOSIS — E1169 Type 2 diabetes mellitus with other specified complication: Secondary | ICD-10-CM | POA: Diagnosis not present

## 2021-04-11 ENCOUNTER — Ambulatory Visit: Payer: Medicare Other | Admitting: Gastroenterology

## 2021-04-11 DIAGNOSIS — J811 Chronic pulmonary edema: Secondary | ICD-10-CM | POA: Diagnosis not present

## 2021-04-12 DIAGNOSIS — K219 Gastro-esophageal reflux disease without esophagitis: Secondary | ICD-10-CM | POA: Diagnosis not present

## 2021-04-12 DIAGNOSIS — E1165 Type 2 diabetes mellitus with hyperglycemia: Secondary | ICD-10-CM | POA: Diagnosis not present

## 2021-04-12 DIAGNOSIS — E119 Type 2 diabetes mellitus without complications: Secondary | ICD-10-CM | POA: Diagnosis not present

## 2021-04-12 DIAGNOSIS — K5900 Constipation, unspecified: Secondary | ICD-10-CM | POA: Diagnosis not present

## 2021-04-12 DIAGNOSIS — E785 Hyperlipidemia, unspecified: Secondary | ICD-10-CM | POA: Diagnosis not present

## 2021-04-12 DIAGNOSIS — N400 Enlarged prostate without lower urinary tract symptoms: Secondary | ICD-10-CM | POA: Diagnosis not present

## 2021-04-12 DIAGNOSIS — U071 COVID-19: Secondary | ICD-10-CM | POA: Diagnosis not present

## 2021-04-14 IMAGING — CT CT ABD-PELV W/ CM
2 of 5 series · 17 of 46 positions shown, 19 images · IV contrast (omnipaque)
Comparison: March 11, 2020

CLINICAL DATA: Abdominal distension.

EXAM:
CT ABDOMEN AND PELVIS WITH CONTRAST
TECHNIQUE: Multidetector CT imaging of the abdomen and pelvis was performed
using the standard protocol following bolus administration of
intravenous contrast.
CONTRAST:  100mL OMNIPAQUE IOHEXOL 300 MG/ML  SOLN

[Series 2: axial st · axial · 0.98mm/px · z∈[-713,-263]mm · 14 of 104 slices shown, 16 images]
[im 7/104  soft-tissue]
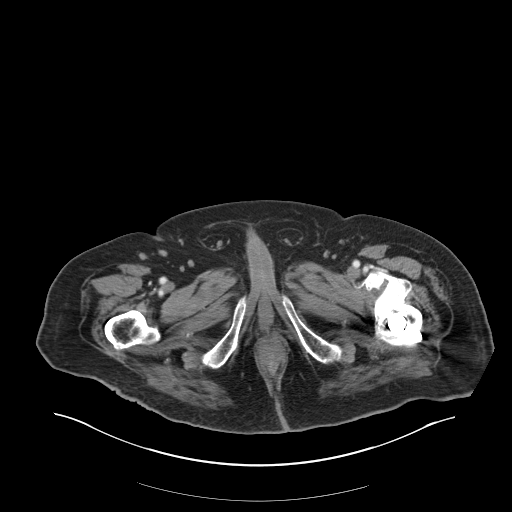
[im 7/104  bone]
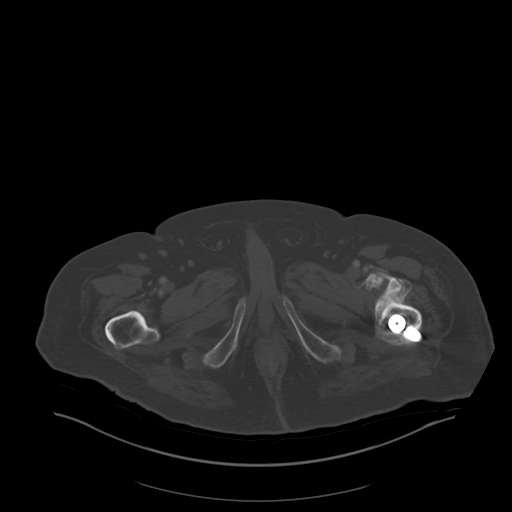
[im 13/104  soft-tissue]
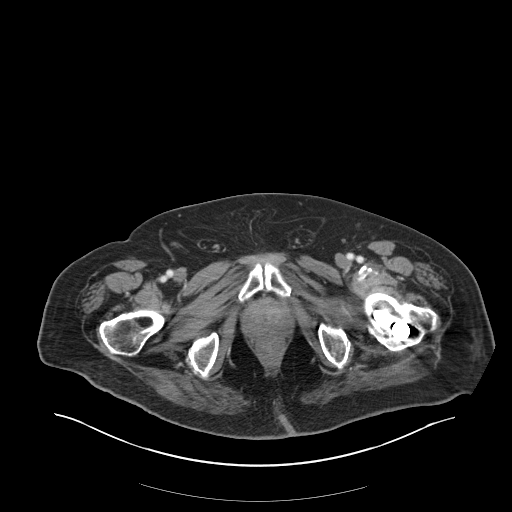
[im 20/104  soft-tissue]
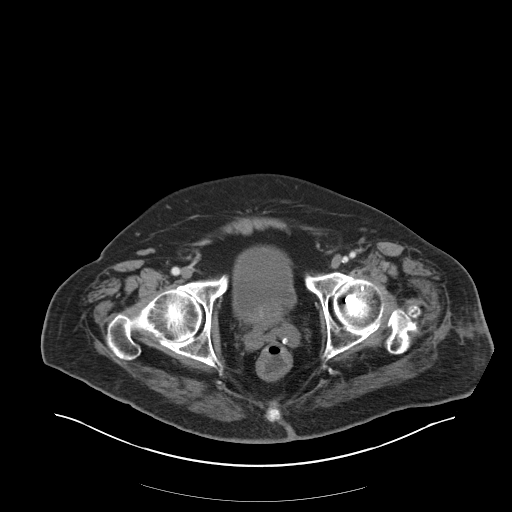
[im 26/104  soft-tissue]
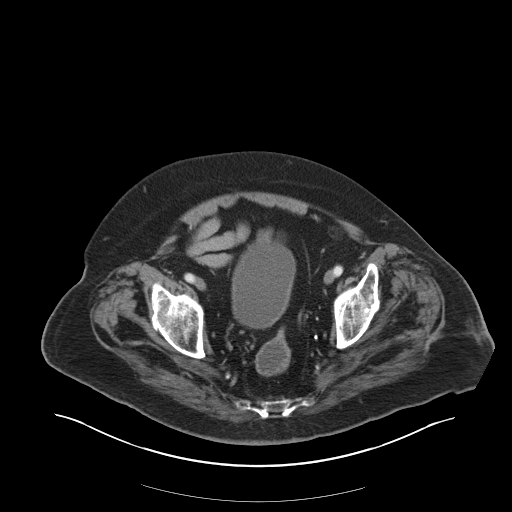
[im 33/104  soft-tissue]
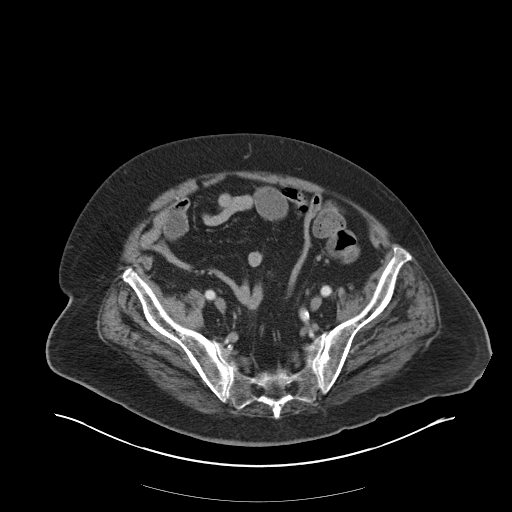
[im 39/104  soft-tissue]
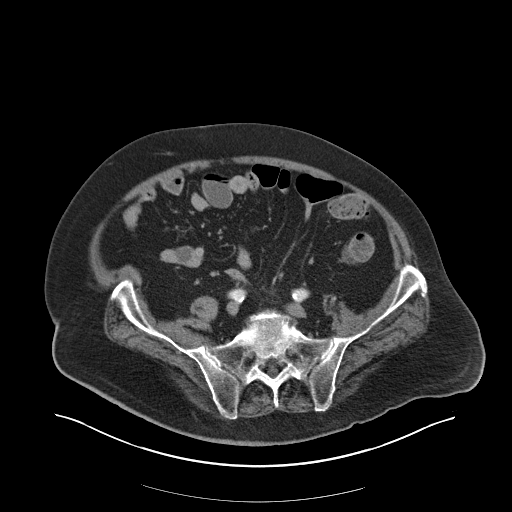
[im 46/104  soft-tissue]
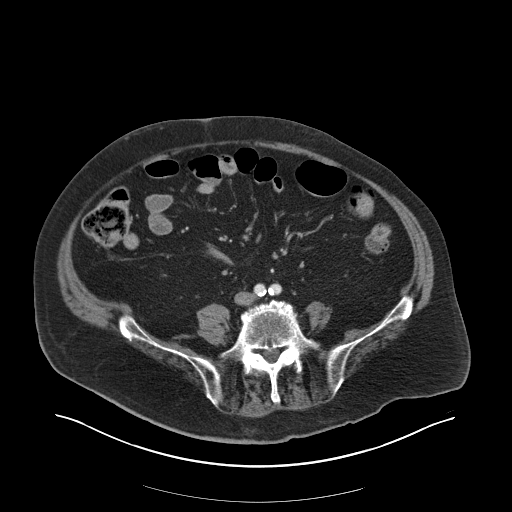
[im 58/104  soft-tissue]
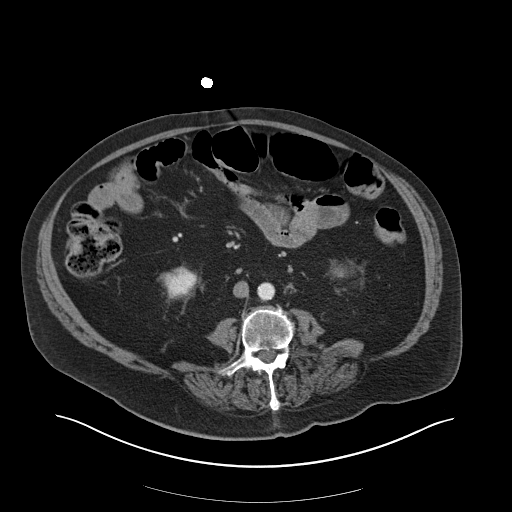
[im 65/104  soft-tissue]
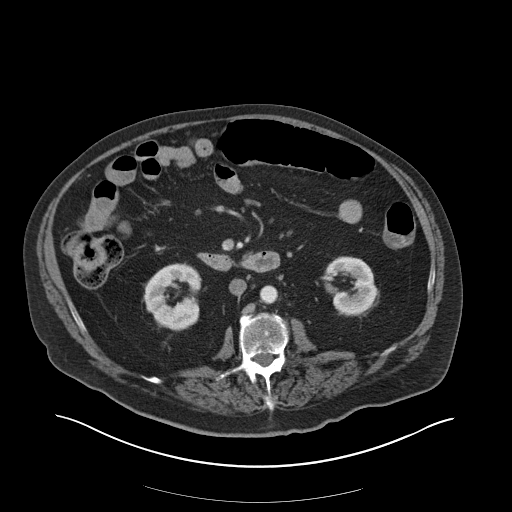
[im 65/104  bone]
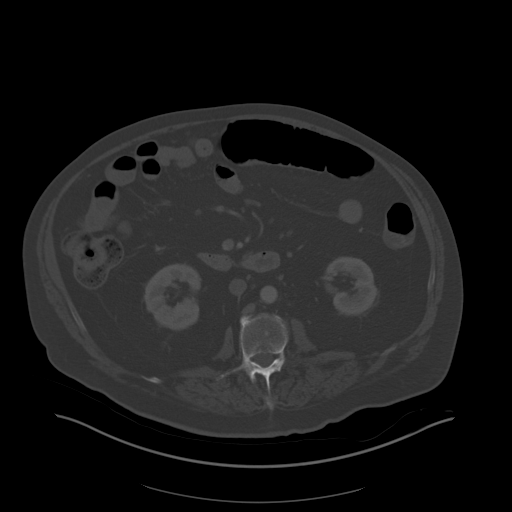
[im 71/104  soft-tissue]
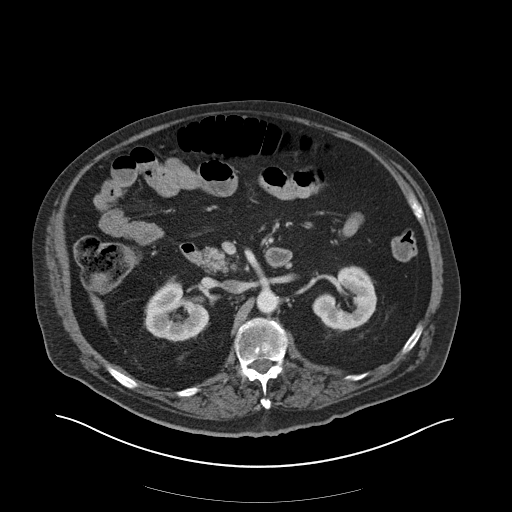
[im 78/104  soft-tissue]
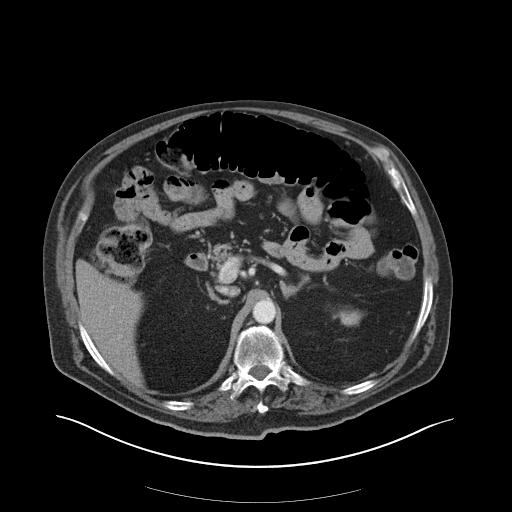
[im 84/104  soft-tissue]
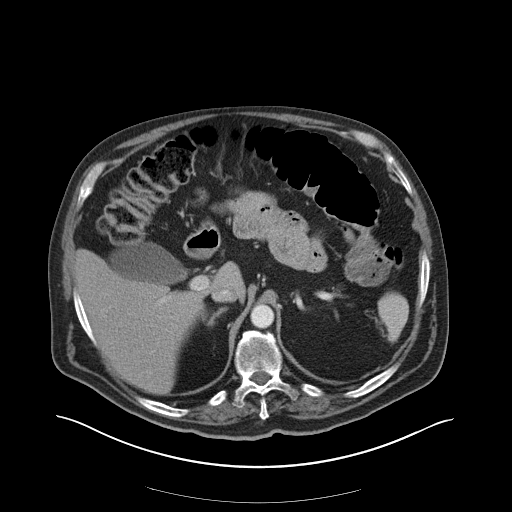
[im 91/104  soft-tissue]
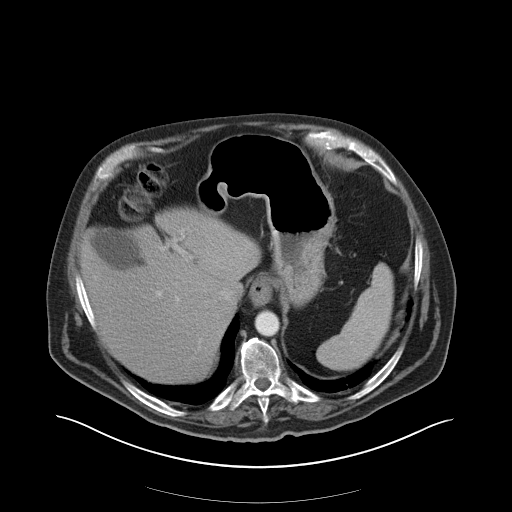
[im 97/104  soft-tissue]
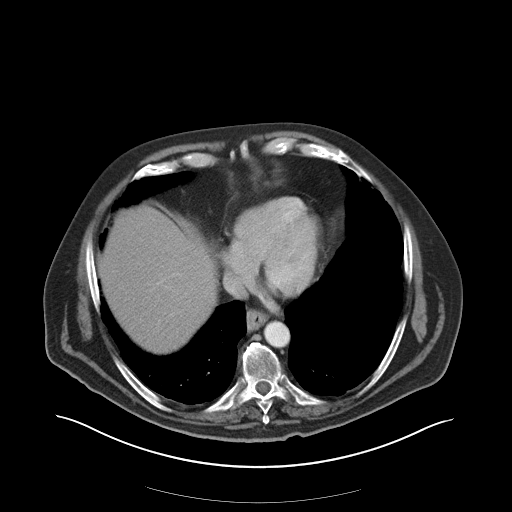

[Series 4: coronal st · coronal · 0.90mm/px · 3 of 179 slices shown]
[im 60/179  soft-tissue]
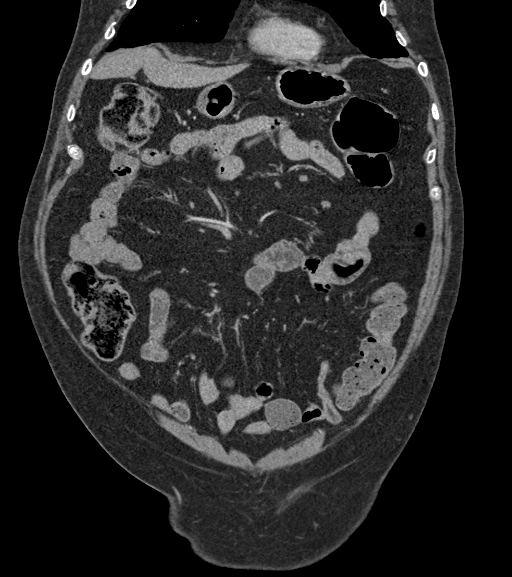
[im 80/179  soft-tissue]
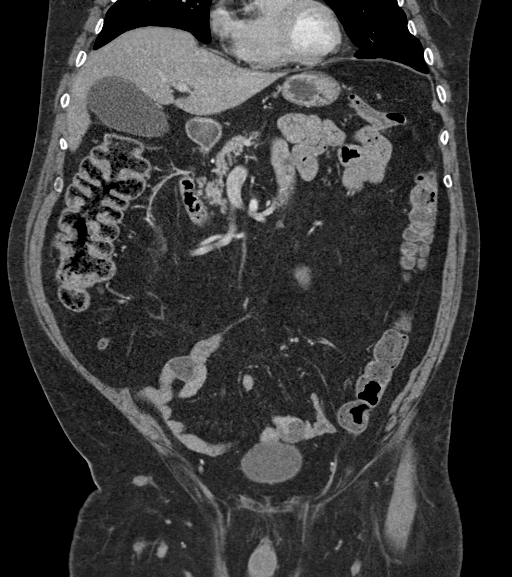
[im 99/179  soft-tissue]
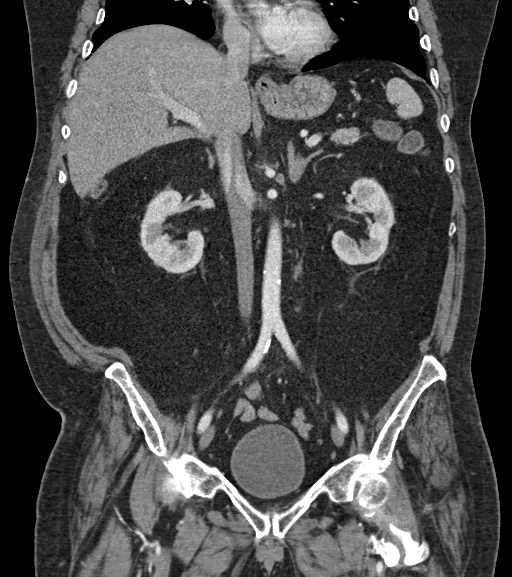

[17 of 46 positions shown; findings below may reference images not displayed]

FINDINGS: Lower chest: No acute abnormality.

Hepatobiliary: 1.9 cm right renal cyst.  The gallbladder is normal.

Pancreas: Unremarkable. No pancreatic ductal dilatation or
surrounding inflammatory changes.

Spleen: Normal in size without focal abnormality.

Adrenals/Urinary Tract: Adrenal glands are unremarkable. Kidneys are
normal, without renal calculi, focal lesion, or hydronephrosis.
Bladder is unremarkable.

Stomach/Bowel: Stomach is within normal limits. No evidence of
appendicitis. No evidence of bowel wall thickening, distention, or
inflammatory changes.

Vascular/Lymphatic: Aortic atherosclerosis. No enlarged abdominal or
pelvic lymph nodes.

Reproductive: Heterogeneous enlarged prostate gland measures 6.0 cm.

Other: No abdominal wall hernia or abnormality. No abdominopelvic
ascites.

Musculoskeletal: Prior left femoral IM nail fixation, partially
visualized. No acute findings.
IMPRESSION: 1. No evidence of acute abnormalities within the abdomen or pelvis.
2. Heterogeneous enlarged prostate gland. Please correlate to serum
PSA values.
3. Aortic atherosclerosis.

Aortic Atherosclerosis ([id number]-CLI.I).

## 2021-04-17 ENCOUNTER — Other Ambulatory Visit: Payer: Self-pay

## 2021-04-17 ENCOUNTER — Telehealth: Payer: Self-pay

## 2021-04-17 ENCOUNTER — Encounter: Payer: Self-pay | Admitting: Gastroenterology

## 2021-04-17 ENCOUNTER — Ambulatory Visit (INDEPENDENT_AMBULATORY_CARE_PROVIDER_SITE_OTHER): Payer: Medicare Other | Admitting: Gastroenterology

## 2021-04-17 VITALS — BP 116/60 | HR 96

## 2021-04-17 DIAGNOSIS — R140 Abdominal distension (gaseous): Secondary | ICD-10-CM | POA: Diagnosis not present

## 2021-04-17 DIAGNOSIS — K5909 Other constipation: Secondary | ICD-10-CM

## 2021-04-17 DIAGNOSIS — R112 Nausea with vomiting, unspecified: Secondary | ICD-10-CM | POA: Diagnosis not present

## 2021-04-17 DIAGNOSIS — D529 Folate deficiency anemia, unspecified: Secondary | ICD-10-CM | POA: Diagnosis not present

## 2021-04-17 NOTE — Telephone Encounter (Signed)
Called and spoke to nurse Pamala Hurry at [facility].  Per Dr. Havery Moros he would like them to increase his Miralax to a double dose BID. She expressed understanding. They understand to titrate down as needed.

## 2021-04-17 NOTE — Progress Notes (Signed)
HPI :  80 year old male with dementia, chronic atrial fibrillation, previously anticoagulated but not on Xarelto as of May, history of DVT/PE, referred to our office by Chana Bode PA after an ED visit for nausea and abdominal discomfort.  The patient cannot recall any the events that led to him to go to the ER in April.  He has a Clinical research associate with him today who is not familiar with his case, her currently resides at [facility].  From reading the notes he came there with abdominal distention and nausea, sounds like he was also constipated.  He had a CT scan done which showed no acute abnormalities, no obstruction.  He was sent home.  He has prescription for ondansetron 4 mg every 6 hours as needed, he is not sure if he takes Tylenol.  Looks like he was given MiraLAX to take once daily, he denies any problems with his bowel habits.  He states in general he has no complaints that bother him and he eats well.  Denies any blood in his stools.  He is on Trulicity once weekly, he is not sure how long he has been on that and how well he tolerates it.  Fortunately the patient is unable to provide any information otherwise other than that he feels well.  No family is present today.  I called his wife [name] who did not answer the phone, I left a message.  Healthcare aide today states she took care of him in the past but is unaware of what been going on recently.  Does not inform that he had any problems eating or problems with his bowels.  I see that he had a colonoscopy with Dr. Almyra Free in 2011 to have small polyp removed.  ADDENDUM: I called the patient's wife again later this afternoon and was able to obtain more history.  Her main concern is that the patient has been constipated and leads to distention of his abdomen with nausea.  He has gone upwards of 2 weeks without a bowel movement at worst from what she reports and she is concerned the center where he lives does not provide  MiraLAX on a daily basis for him.  She states his activity level is quite low and further makes his constipation worse.  She inquires about a colonoscopy.  I asked her if she thought he could tolerate a bowel preparation and she does not think he would except that at all and that he would probably not want a colonoscopy.  We reviewed imaging and lab work together and discussed plan as below.  CT scan abdomen / pelvis with contrast 03/04/21: IMPRESSION: 1. No evidence of acute abnormalities within the abdomen or pelvis. 2. Heterogeneous enlarged prostate gland. Please correlate to serum PSA values. 3. Aortic atherosclerosis.   CT abdomen / pelvis with contrast 01/21/21: IMPRESSION: No acute intra-abdominal pathology. No definite radiographic explanation for the patient's reported symptoms.  Aortic Atherosclerosis (ICD10-I70.0).    Colonoscopy 06/07/2010 - Dr. Benson Norway, one small polyp removed  Past Medical History:  Diagnosis Date  . Agitation   . Anemia   . Anxiety   . Arthritis   . BPH (benign prostatic hyperplasia)   . Cataract   . Chronic atrial fibrillation ([place])   . Chronic back pain   . Confusion   . Dementia ([place])   . Depression   . Diabetes ([place])    type 2  . DVT (deep venous thrombosis) ([place]) 01/2015  . GERD (gastroesophageal  reflux disease)   . Hearing impaired   . High cholesterol   . History of TIA (transient ischemic attack)   . Hyperlipidemia   . Hypertension   . Insomnia   . Leg pain, diffuse   . Leg swelling   . Memory loss   . Metabolic encephalopathy   . Mild cognitive impairment    sees [facility]  . Numbness    fingers, feet, toes  . OSA on CPAP   . Osteoarthritis   . Peripheral arterial disease ([place])   . 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 06/14/2015  . Pulmonary embolism ([place]) 01/2015  . Spinal stenosis    lumbar  . Tremor    on propranolol  . Vitamin B deficiency   . Wears glasses       Past Surgical History:  Procedure Laterality Date  . CATARACT EXTRACTION    . COLONOSCOPY  2014  . INTRAMEDULLARY (IM) NAIL INTERTROCHANTERIC Left 03/05/2020   Procedure: INTRAMEDULLARY (IM) NAIL INTERTROCHANTRIC;  Surgeon: Carole Civil, MD;  Location: [facility];  Service: Orthopedics;  Laterality: Left;  . TONSILLECTOMY     Family History  Problem Relation Age of Onset  . Dementia Father   . Clotting disorder Mother   . Heart disease Brother   . Clotting disorder Brother   . Psychiatric Illness Sister   . Leukemia Sister    Social History   Tobacco Use  . Smoking status: Former Smoker    Packs/day: 1.00    Years: 10.00    Pack years: 10.00    Types: Cigarettes    Quit date: 11/25/2004    Years since quitting: 16.4  . Smokeless tobacco: Never Used  Vaping Use  . Vaping Use: Never used  Substance Use Topics  . Alcohol use: Yes    Alcohol/week: 1.0 standard drink    Types: 1 Glasses of wine per week    Comment: once a month per pt   . Drug use: Not Currently   Current Outpatient Medications  Medication Sig Dispense Refill  . acetaminophen (TYLENOL) 325 MG tablet Take 650 mg by mouth 2 (two) times daily.    [name] aspirin 325 MG tablet Take 325 mg by mouth daily.    . B Complex Vitamins (VITAMIN B COMPLEX PO) Take 1 tablet by mouth daily.    [name] buPROPion (WELLBUTRIN XL) 300 MG 24 hr tablet Take 300 mg by mouth daily.    . busPIRone (BUSPAR) 5 MG tablet Take 5 mg by mouth 2 (two) times daily.    . chlorhexidine (PERIDEX) 0.12 % solution Use as directed 15 mLs in the mouth or throat at bedtime.    . Cholecalciferol (VITAMIN D3) 50 MCG (2000 UT) TABS Take 1 tablet by mouth daily.    . DENTA 5000 PLUS 1.1 % CREA dental cream Take 1 application by mouth at bedtime. Use a pea sized amount on toothbrush, spit out excess, don't rinse per dentist    . dextromethorphan-guaiFENesin (ROBITUSSIN-DM) 10-100 MG/5ML liquid Take by mouth every 4 (four) hours as needed for cough.    .  diltiazem (CARDIZEM) 30 MG tablet TAKE 1 TABLET(30 MG) BY MOUTH TWICE DAILY (Patient taking differently: Take 30 mg by mouth 2 (two) times daily.) 60 tablet 0  . docusate sodium (COLACE) 100 MG capsule Take 100 mg by mouth daily.    [name] donepezil (ARICEPT) 10 MG tablet Take 1 tablet (10 mg) by mouth at bedtime. 30 tablet  0  . Dulaglutide (TRULICITY) 1.5 RA/0.7MA SOPN Inject 1.5 mg into the skin. Every Wednesday    . famotidine (PEPCID) 20 MG tablet Take 20 mg by mouth daily.    . ferrous sulfate 325 (65 FE) MG tablet Take 325 mg by mouth daily with breakfast.    . folic acid (FOLVITE) 1 MG tablet Take 1 mg daily (Patient taking differently: Take 1 mg by mouth in the morning.) 30 tablet 0  . furosemide (LASIX) 40 MG tablet Take 40 mg daily AS NEEDED  For swelling. (Patient taking differently: Take 40 mg by mouth daily.) 30 tablet 0  . insulin lispro (HUMALOG) 100 UNIT/ML KwikPen Inject 2-10 Units into the skin See admin instructions. 71-150= 0 units, 151-200=2 units, 201-250=4 units, 251-300= 6 units, 301-350= 8 units, 351-400=10 units, >400 call md     Sliding scale    . lisinopril (ZESTRIL) 10 MG tablet Take 10 mg by mouth daily.    . memantine (NAMENDA XR) 28 MG CP24 24 hr capsule TAKE 1 CAPSULE(28 MG) BY MOUTH DAILY (Patient taking differently: Take 28 mg by mouth in the morning.) 30 capsule 0  . metFORMIN (GLUCOPHAGE) 1000 MG tablet Take 1,000 mg by mouth 2 (two) times daily.    . Misc Natural Products (OSTEO BI-FLEX ADV JOINT SHIELD PO) Take 2 tablets by mouth daily.     . ondansetron (ZOFRAN) 4 MG tablet Take 4 mg by mouth every 6 (six) hours as needed for nausea.    . polyethylene glycol (MIRALAX / GLYCOLAX) 17 g packet Take 17 g by mouth daily.    . rosuvastatin (CRESTOR) 10 MG tablet TAKE 1 TABLET(10 MG) BY MOUTH AT BEDTIME (Patient taking differently: Take 10 mg by mouth daily.) 30 tablet 0  . simethicone (MYLICON) 80 MG chewable tablet Chew 80 mg by mouth every 6 (six) hours as needed for  flatulence.    Tyler Aas FLEXTOUCH 100 UNIT/ML FlexTouch Pen Inject 16 Units into the skin every morning.    . vitamin B-12 (CYANOCOBALAMIN) 500 MCG tablet Take 1 tablet (500 mcg total) by mouth daily.    . vitamin C (ASCORBIC ACID) 500 MG tablet Take 500 mg by mouth 2 (two) times daily.    . Zinc 50 MG TABS Take 1 tablet by mouth daily.     No current facility-administered medications for this visit.   Allergies  Allergen Reactions  . Bee Venom Swelling  . Ultram [Tramadol Hcl]     Makes him wired, gives insomnia  . Oxycodone Other (See Comments)    Mental status changes  . Oxycontin [Oxycodone Hcl] Other (See Comments)    Mental status changes per pt  . Zoloft [Sertraline Hcl] Other (See Comments)    Increases confusion per wife      Review of Systems: All systems reviewed and negative except where noted in HPI.   Lab Results  Component Value Date   WBC 13.8 (H) 03/04/2021   HGB 14.0 03/04/2021   HCT 43.3 03/04/2021   MCV 94.3 03/04/2021   PLT 277 03/04/2021    Lab Results  Component Value Date   CREATININE 1.29 (H) 03/04/2021   BUN 28 (H) 03/04/2021   NA 131 (L) 03/04/2021   K 4.5 03/04/2021   CL 101 03/04/2021   CO2 17 (L) 03/04/2021    Lab Results  Component Value Date   ALT 12 03/04/2021   AST 15 03/04/2021   ALKPHOS 84 03/04/2021   BILITOT 0.7 03/04/2021  Physical Exam: BP 116/60 (BP Location: Left Arm, Patient Position: Sitting, Cuff Size: Normal)   Pulse 96  Constitutional: Pleasant,  male in wheelchair  Cardiovascular: Normal rate, regular rhythm.  Pulmonary/chest: Effort normal and breath sounds normal.  Abdominal: Soft, nondistended, nontender.  There are no masses palpable.  Extremities: no edema Psychiatric: Normal mood and affect. Behavior is normal.   ASSESSMENT AND PLAN: 80 year old male here for new patient assessment the following:  Chronic constipation Nausea Abdominal distension  As above, initially information gleaned  from only the record, the patient unable to provide any information.  States he feels fine without any complaints, wonders why he is here.  ED record reviewed.  I was eventually able to get a hold of his wife [name] and spoke with her for 10 minutes on the phone about his case. She is concerned about chronic constipation that is undertreated and has led to episodes of significant abdominal distention and nausea.  She does not think he is getting his MiraLAX routinely and we discussed options at length.  I reviewed that his blood work looks okay and his CT scan does not show any obvious colon mass or any concerning lesions.  She inquires about a colonoscopy but also states she does not think there is any way the patient could handle that and he would refuse the prep.  In that light I recommend we try some initial measures of treating constipation and see if this helps him.  We will contact the nursing home to see if they can give him MiraLAX twice daily every day and we can titrate that dose up or down pending his course.  Goal is to have a bowel movement every day.  Hopefully this will prevent these episodes of abdominal distention associated with it.  If the regimen is too strong for him we can always decrease the dose.  Shee was happy with this plan and will contact me with an update in his status over the next few weeks.  Yoder Cellar, MD [facility]

## 2021-04-19 DIAGNOSIS — E1169 Type 2 diabetes mellitus with other specified complication: Secondary | ICD-10-CM | POA: Diagnosis not present

## 2021-04-19 DIAGNOSIS — D72829 Elevated white blood cell count, unspecified: Secondary | ICD-10-CM | POA: Diagnosis not present

## 2021-04-21 DIAGNOSIS — E785 Hyperlipidemia, unspecified: Secondary | ICD-10-CM | POA: Diagnosis not present

## 2021-04-21 DIAGNOSIS — E1165 Type 2 diabetes mellitus with hyperglycemia: Secondary | ICD-10-CM | POA: Diagnosis not present

## 2021-04-21 DIAGNOSIS — I10 Essential (primary) hypertension: Secondary | ICD-10-CM | POA: Diagnosis not present

## 2021-04-21 DIAGNOSIS — E119 Type 2 diabetes mellitus without complications: Secondary | ICD-10-CM | POA: Diagnosis not present

## 2021-04-21 DIAGNOSIS — F0390 Unspecified dementia without behavioral disturbance: Secondary | ICD-10-CM | POA: Diagnosis not present

## 2021-04-21 DIAGNOSIS — N400 Enlarged prostate without lower urinary tract symptoms: Secondary | ICD-10-CM | POA: Diagnosis not present

## 2021-04-21 DIAGNOSIS — D649 Anemia, unspecified: Secondary | ICD-10-CM | POA: Diagnosis not present

## 2021-04-21 DIAGNOSIS — F329 Major depressive disorder, single episode, unspecified: Secondary | ICD-10-CM | POA: Diagnosis not present

## 2021-04-21 DIAGNOSIS — F331 Major depressive disorder, recurrent, moderate: Secondary | ICD-10-CM | POA: Diagnosis not present

## 2021-04-21 DIAGNOSIS — F4321 Adjustment disorder with depressed mood: Secondary | ICD-10-CM | POA: Diagnosis not present

## 2021-04-21 DIAGNOSIS — F0391 Unspecified dementia with behavioral disturbance: Secondary | ICD-10-CM | POA: Diagnosis not present

## 2021-04-24 DIAGNOSIS — F0391 Unspecified dementia with behavioral disturbance: Secondary | ICD-10-CM | POA: Diagnosis not present

## 2021-04-24 DIAGNOSIS — R1310 Dysphagia, unspecified: Secondary | ICD-10-CM | POA: Diagnosis not present

## 2021-04-24 DIAGNOSIS — R41841 Cognitive communication deficit: Secondary | ICD-10-CM | POA: Diagnosis not present

## 2021-04-24 DIAGNOSIS — U071 COVID-19: Secondary | ICD-10-CM | POA: Diagnosis not present

## 2021-04-24 DIAGNOSIS — E1169 Type 2 diabetes mellitus with other specified complication: Secondary | ICD-10-CM | POA: Diagnosis not present

## 2021-04-25 DIAGNOSIS — R41841 Cognitive communication deficit: Secondary | ICD-10-CM | POA: Diagnosis not present

## 2021-04-25 DIAGNOSIS — F0391 Unspecified dementia with behavioral disturbance: Secondary | ICD-10-CM | POA: Diagnosis not present

## 2021-04-25 DIAGNOSIS — E1169 Type 2 diabetes mellitus with other specified complication: Secondary | ICD-10-CM | POA: Diagnosis not present

## 2021-04-25 DIAGNOSIS — R1310 Dysphagia, unspecified: Secondary | ICD-10-CM | POA: Diagnosis not present

## 2021-04-25 DIAGNOSIS — E1165 Type 2 diabetes mellitus with hyperglycemia: Secondary | ICD-10-CM | POA: Diagnosis not present

## 2021-04-26 DIAGNOSIS — R1310 Dysphagia, unspecified: Secondary | ICD-10-CM | POA: Diagnosis not present

## 2021-04-26 DIAGNOSIS — E1165 Type 2 diabetes mellitus with hyperglycemia: Secondary | ICD-10-CM | POA: Diagnosis not present

## 2021-04-26 DIAGNOSIS — R41841 Cognitive communication deficit: Secondary | ICD-10-CM | POA: Diagnosis not present

## 2021-04-26 DIAGNOSIS — E1169 Type 2 diabetes mellitus with other specified complication: Secondary | ICD-10-CM | POA: Diagnosis not present

## 2021-04-26 DIAGNOSIS — F0391 Unspecified dementia with behavioral disturbance: Secondary | ICD-10-CM | POA: Diagnosis not present

## 2021-04-27 DIAGNOSIS — R41841 Cognitive communication deficit: Secondary | ICD-10-CM | POA: Diagnosis not present

## 2021-04-27 DIAGNOSIS — R1310 Dysphagia, unspecified: Secondary | ICD-10-CM | POA: Diagnosis not present

## 2021-04-27 DIAGNOSIS — E1169 Type 2 diabetes mellitus with other specified complication: Secondary | ICD-10-CM | POA: Diagnosis not present

## 2021-04-27 DIAGNOSIS — F0391 Unspecified dementia with behavioral disturbance: Secondary | ICD-10-CM | POA: Diagnosis not present

## 2021-04-27 DIAGNOSIS — E1165 Type 2 diabetes mellitus with hyperglycemia: Secondary | ICD-10-CM | POA: Diagnosis not present

## 2021-04-30 DIAGNOSIS — F0391 Unspecified dementia with behavioral disturbance: Secondary | ICD-10-CM | POA: Diagnosis not present

## 2021-04-30 DIAGNOSIS — R41841 Cognitive communication deficit: Secondary | ICD-10-CM | POA: Diagnosis not present

## 2021-04-30 DIAGNOSIS — R1310 Dysphagia, unspecified: Secondary | ICD-10-CM | POA: Diagnosis not present

## 2021-04-30 DIAGNOSIS — E1169 Type 2 diabetes mellitus with other specified complication: Secondary | ICD-10-CM | POA: Diagnosis not present

## 2021-04-30 DIAGNOSIS — E1165 Type 2 diabetes mellitus with hyperglycemia: Secondary | ICD-10-CM | POA: Diagnosis not present

## 2021-05-02 ENCOUNTER — Telehealth: Payer: Self-pay | Admitting: Medical

## 2021-05-02 NOTE — Telephone Encounter (Signed)
Please send wife, Mycheal Veldhuizen my patient a sympathy card as her husband Smitty passed away

## 2021-05-03 NOTE — Telephone Encounter (Signed)
Sympathy card sent to family 

## 2021-05-25 DEATH — deceased
